# Patient Record
Sex: Female | Born: 1965
Health system: Southern US, Community
[De-identification: ages and names within clinical notes are randomized; demographics above are authoritative.]

## PROBLEM LIST (undated history)

## (undated) DIAGNOSIS — R112 Nausea with vomiting, unspecified: Secondary | ICD-10-CM

## (undated) DIAGNOSIS — T7840XA Allergy, unspecified, initial encounter: Secondary | ICD-10-CM

## (undated) DIAGNOSIS — I10 Essential (primary) hypertension: Secondary | ICD-10-CM

## (undated) DIAGNOSIS — Z923 Personal history of irradiation: Secondary | ICD-10-CM

## (undated) DIAGNOSIS — Z86010 Personal history of colonic polyps: Secondary | ICD-10-CM

## (undated) DIAGNOSIS — E119 Type 2 diabetes mellitus without complications: Secondary | ICD-10-CM

## (undated) DIAGNOSIS — J383 Other diseases of vocal cords: Secondary | ICD-10-CM

## (undated) DIAGNOSIS — C50919 Malignant neoplasm of unspecified site of unspecified female breast: Secondary | ICD-10-CM

## (undated) DIAGNOSIS — Z9889 Other specified postprocedural states: Secondary | ICD-10-CM

## (undated) HISTORY — DX: Allergy, unspecified, initial encounter: T78.40XA

## (undated) HISTORY — DX: Essential (primary) hypertension: I10

## (undated) HISTORY — DX: Malignant neoplasm of unspecified site of unspecified female breast: C50.919

## (undated) HISTORY — DX: Type 2 diabetes mellitus without complications: E11.9

## (undated) HISTORY — DX: Other diseases of vocal cords: J38.3

## (undated) HISTORY — DX: Personal history of colonic polyps: Z86.010

## (undated) HISTORY — DX: Personal history of irradiation: Z92.3

---

## 1983-04-02 HISTORY — PX: BREAST SURGERY: SHX581

## 1997-08-29 ENCOUNTER — Encounter: Admission: RE | Admit: 1997-08-29 | Discharge: 1997-08-29 | Payer: Self-pay | Admitting: Sports Medicine

## 1998-08-04 ENCOUNTER — Other Ambulatory Visit: Admission: RE | Admit: 1998-08-04 | Discharge: 1998-08-04 | Payer: Self-pay | Admitting: *Deleted

## 1998-09-15 ENCOUNTER — Other Ambulatory Visit: Admission: RE | Admit: 1998-09-15 | Discharge: 1998-09-15 | Payer: Self-pay | Admitting: *Deleted

## 1998-09-29 ENCOUNTER — Other Ambulatory Visit: Admission: RE | Admit: 1998-09-29 | Discharge: 1998-09-29 | Payer: Self-pay | Admitting: *Deleted

## 1998-09-29 ENCOUNTER — Encounter (INDEPENDENT_AMBULATORY_CARE_PROVIDER_SITE_OTHER): Payer: Self-pay | Admitting: Specialist

## 1999-09-28 ENCOUNTER — Other Ambulatory Visit: Admission: RE | Admit: 1999-09-28 | Discharge: 1999-09-28 | Payer: Self-pay | Admitting: *Deleted

## 1999-10-02 ENCOUNTER — Encounter: Admission: RE | Admit: 1999-10-02 | Discharge: 1999-10-02 | Payer: Self-pay | Admitting: *Deleted

## 1999-10-02 ENCOUNTER — Encounter: Payer: Self-pay | Admitting: *Deleted

## 2000-10-28 ENCOUNTER — Other Ambulatory Visit: Admission: RE | Admit: 2000-10-28 | Discharge: 2000-10-28 | Payer: Self-pay | Admitting: *Deleted

## 2001-04-01 ENCOUNTER — Encounter: Payer: Self-pay | Admitting: Family Medicine

## 2001-04-01 ENCOUNTER — Ambulatory Visit (HOSPITAL_COMMUNITY): Admission: RE | Admit: 2001-04-01 | Discharge: 2001-04-01 | Payer: Self-pay | Admitting: Family Medicine

## 2002-03-02 ENCOUNTER — Encounter: Admission: RE | Admit: 2002-03-02 | Discharge: 2002-03-02 | Payer: Self-pay | Admitting: Family Medicine

## 2002-03-02 ENCOUNTER — Encounter: Payer: Self-pay | Admitting: Family Medicine

## 2002-09-07 ENCOUNTER — Encounter: Admission: RE | Admit: 2002-09-07 | Discharge: 2002-09-07 | Payer: Self-pay | Admitting: Occupational Medicine

## 2002-09-07 ENCOUNTER — Encounter: Payer: Self-pay | Admitting: Occupational Medicine

## 2003-03-29 ENCOUNTER — Encounter: Admission: RE | Admit: 2003-03-29 | Discharge: 2003-03-29 | Payer: Self-pay | Admitting: Family Medicine

## 2004-04-24 ENCOUNTER — Encounter: Admission: RE | Admit: 2004-04-24 | Discharge: 2004-04-24 | Payer: Self-pay | Admitting: Obstetrics and Gynecology

## 2004-05-30 ENCOUNTER — Emergency Department (HOSPITAL_COMMUNITY): Admission: EM | Admit: 2004-05-30 | Discharge: 2004-05-30 | Payer: Self-pay | Admitting: Emergency Medicine

## 2004-08-30 ENCOUNTER — Emergency Department (HOSPITAL_COMMUNITY): Admission: EM | Admit: 2004-08-30 | Discharge: 2004-08-30 | Payer: Self-pay | Admitting: Family Medicine

## 2004-09-03 ENCOUNTER — Ambulatory Visit (HOSPITAL_COMMUNITY): Admission: RE | Admit: 2004-09-03 | Discharge: 2004-09-03 | Payer: Self-pay | Admitting: Allergy

## 2004-10-16 ENCOUNTER — Emergency Department (HOSPITAL_COMMUNITY): Admission: EM | Admit: 2004-10-16 | Discharge: 2004-10-16 | Payer: Self-pay | Admitting: Family Medicine

## 2005-03-11 ENCOUNTER — Emergency Department (HOSPITAL_COMMUNITY): Admission: EM | Admit: 2005-03-11 | Discharge: 2005-03-11 | Payer: Self-pay | Admitting: Emergency Medicine

## 2005-05-03 ENCOUNTER — Encounter: Admission: RE | Admit: 2005-05-03 | Discharge: 2005-05-03 | Payer: Self-pay | Admitting: Family Medicine

## 2005-05-08 ENCOUNTER — Emergency Department (HOSPITAL_COMMUNITY): Admission: EM | Admit: 2005-05-08 | Discharge: 2005-05-08 | Payer: Self-pay | Admitting: Emergency Medicine

## 2005-05-28 ENCOUNTER — Other Ambulatory Visit: Admission: RE | Admit: 2005-05-28 | Discharge: 2005-05-28 | Payer: Self-pay | Admitting: Obstetrics and Gynecology

## 2005-07-24 ENCOUNTER — Ambulatory Visit: Payer: Self-pay | Admitting: Internal Medicine

## 2005-08-06 ENCOUNTER — Ambulatory Visit: Payer: Self-pay | Admitting: Internal Medicine

## 2005-08-13 ENCOUNTER — Ambulatory Visit: Payer: Self-pay

## 2005-08-13 ENCOUNTER — Encounter: Payer: Self-pay | Admitting: Cardiology

## 2005-10-14 ENCOUNTER — Ambulatory Visit: Payer: Self-pay | Admitting: Internal Medicine

## 2005-12-27 ENCOUNTER — Ambulatory Visit: Payer: Self-pay | Admitting: Internal Medicine

## 2006-01-07 ENCOUNTER — Ambulatory Visit: Payer: Self-pay | Admitting: Critical Care Medicine

## 2006-01-07 ENCOUNTER — Ambulatory Visit: Payer: Self-pay | Admitting: Internal Medicine

## 2006-01-08 ENCOUNTER — Encounter: Payer: Self-pay | Admitting: Critical Care Medicine

## 2006-01-14 ENCOUNTER — Ambulatory Visit (HOSPITAL_COMMUNITY): Admission: RE | Admit: 2006-01-14 | Discharge: 2006-01-14 | Payer: Self-pay | Admitting: Internal Medicine

## 2006-01-23 ENCOUNTER — Ambulatory Visit: Payer: Self-pay | Admitting: Critical Care Medicine

## 2006-02-06 ENCOUNTER — Ambulatory Visit: Payer: Self-pay | Admitting: Critical Care Medicine

## 2006-02-11 ENCOUNTER — Encounter: Payer: Self-pay | Admitting: Critical Care Medicine

## 2006-02-11 ENCOUNTER — Ambulatory Visit (HOSPITAL_COMMUNITY): Admission: RE | Admit: 2006-02-11 | Discharge: 2006-02-11 | Payer: Self-pay | Admitting: Critical Care Medicine

## 2006-02-26 ENCOUNTER — Ambulatory Visit: Payer: Self-pay | Admitting: Critical Care Medicine

## 2006-03-18 ENCOUNTER — Ambulatory Visit: Payer: Self-pay | Admitting: Critical Care Medicine

## 2006-04-18 ENCOUNTER — Ambulatory Visit: Payer: Self-pay | Admitting: Internal Medicine

## 2006-06-30 ENCOUNTER — Ambulatory Visit: Payer: Self-pay | Admitting: Critical Care Medicine

## 2006-09-03 ENCOUNTER — Ambulatory Visit: Payer: Self-pay | Admitting: Critical Care Medicine

## 2006-11-19 ENCOUNTER — Emergency Department (HOSPITAL_COMMUNITY): Admission: EM | Admit: 2006-11-19 | Discharge: 2006-11-19 | Payer: Self-pay | Admitting: Family Medicine

## 2006-12-12 ENCOUNTER — Emergency Department (HOSPITAL_COMMUNITY): Admission: EM | Admit: 2006-12-12 | Discharge: 2006-12-12 | Payer: Self-pay | Admitting: Emergency Medicine

## 2006-12-15 ENCOUNTER — Ambulatory Visit: Payer: Self-pay | Admitting: *Deleted

## 2007-01-19 DIAGNOSIS — J309 Allergic rhinitis, unspecified: Secondary | ICD-10-CM | POA: Insufficient documentation

## 2007-01-19 DIAGNOSIS — I10 Essential (primary) hypertension: Secondary | ICD-10-CM | POA: Insufficient documentation

## 2007-03-03 ENCOUNTER — Ambulatory Visit: Payer: Self-pay | Admitting: Critical Care Medicine

## 2007-05-04 ENCOUNTER — Telehealth: Payer: Self-pay | Admitting: Critical Care Medicine

## 2007-12-23 ENCOUNTER — Telehealth (INDEPENDENT_AMBULATORY_CARE_PROVIDER_SITE_OTHER): Payer: Self-pay | Admitting: *Deleted

## 2008-01-12 ENCOUNTER — Ambulatory Visit: Payer: Self-pay | Admitting: Internal Medicine

## 2008-02-15 ENCOUNTER — Ambulatory Visit: Payer: Self-pay | Admitting: Internal Medicine

## 2008-03-11 ENCOUNTER — Ambulatory Visit: Payer: Self-pay | Admitting: Critical Care Medicine

## 2008-03-11 DIAGNOSIS — R0989 Other specified symptoms and signs involving the circulatory and respiratory systems: Secondary | ICD-10-CM | POA: Insufficient documentation

## 2008-03-11 DIAGNOSIS — R0609 Other forms of dyspnea: Secondary | ICD-10-CM

## 2008-04-18 ENCOUNTER — Ambulatory Visit: Payer: Self-pay | Admitting: Internal Medicine

## 2008-04-22 ENCOUNTER — Encounter: Payer: Self-pay | Admitting: Critical Care Medicine

## 2008-04-25 ENCOUNTER — Encounter: Payer: Self-pay | Admitting: Critical Care Medicine

## 2008-05-02 ENCOUNTER — Telehealth (INDEPENDENT_AMBULATORY_CARE_PROVIDER_SITE_OTHER): Payer: Self-pay | Admitting: *Deleted

## 2008-07-18 ENCOUNTER — Ambulatory Visit: Payer: Self-pay | Admitting: Critical Care Medicine

## 2008-08-04 ENCOUNTER — Telehealth: Payer: Self-pay | Admitting: Critical Care Medicine

## 2008-08-16 ENCOUNTER — Ambulatory Visit: Payer: Self-pay | Admitting: Internal Medicine

## 2008-09-15 ENCOUNTER — Ambulatory Visit: Payer: Self-pay | Admitting: Internal Medicine

## 2008-10-19 ENCOUNTER — Telehealth (INDEPENDENT_AMBULATORY_CARE_PROVIDER_SITE_OTHER): Payer: Self-pay | Admitting: *Deleted

## 2008-12-16 ENCOUNTER — Ambulatory Visit: Payer: Self-pay | Admitting: Internal Medicine

## 2009-01-12 ENCOUNTER — Ambulatory Visit: Payer: Self-pay | Admitting: Internal Medicine

## 2009-01-25 ENCOUNTER — Telehealth: Payer: Self-pay | Admitting: Internal Medicine

## 2009-02-06 ENCOUNTER — Ambulatory Visit: Payer: Self-pay | Admitting: Internal Medicine

## 2009-02-08 ENCOUNTER — Telehealth (INDEPENDENT_AMBULATORY_CARE_PROVIDER_SITE_OTHER): Payer: Self-pay | Admitting: *Deleted

## 2009-04-27 ENCOUNTER — Ambulatory Visit: Payer: Self-pay | Admitting: Internal Medicine

## 2009-04-27 DIAGNOSIS — M549 Dorsalgia, unspecified: Secondary | ICD-10-CM | POA: Insufficient documentation

## 2009-05-05 ENCOUNTER — Telehealth: Payer: Self-pay | Admitting: Internal Medicine

## 2009-05-29 ENCOUNTER — Ambulatory Visit: Payer: Self-pay | Admitting: Internal Medicine

## 2009-07-24 ENCOUNTER — Telehealth: Payer: Self-pay | Admitting: Critical Care Medicine

## 2009-07-24 ENCOUNTER — Ambulatory Visit: Payer: Self-pay | Admitting: Critical Care Medicine

## 2009-07-24 DIAGNOSIS — I498 Other specified cardiac arrhythmias: Secondary | ICD-10-CM | POA: Insufficient documentation

## 2009-07-24 DIAGNOSIS — J383 Other diseases of vocal cords: Secondary | ICD-10-CM | POA: Insufficient documentation

## 2009-07-24 LAB — CONVERTED CEMR LAB
T3 Uptake Ratio: 27.9 % (ref 22.5–37.0)
TSH: 0.77 microintl units/mL (ref 0.35–5.50)

## 2009-07-26 ENCOUNTER — Telehealth: Payer: Self-pay | Admitting: Internal Medicine

## 2009-07-26 DIAGNOSIS — E049 Nontoxic goiter, unspecified: Secondary | ICD-10-CM | POA: Insufficient documentation

## 2009-07-28 ENCOUNTER — Encounter: Admission: RE | Admit: 2009-07-28 | Discharge: 2009-09-07 | Payer: Self-pay | Admitting: Critical Care Medicine

## 2009-08-01 ENCOUNTER — Encounter: Payer: Self-pay | Admitting: Internal Medicine

## 2009-08-01 ENCOUNTER — Encounter: Admission: RE | Admit: 2009-08-01 | Discharge: 2009-08-01 | Payer: Self-pay | Admitting: Neurological Surgery

## 2009-08-09 ENCOUNTER — Telehealth (INDEPENDENT_AMBULATORY_CARE_PROVIDER_SITE_OTHER): Payer: Self-pay | Admitting: *Deleted

## 2009-08-29 ENCOUNTER — Telehealth: Payer: Self-pay | Admitting: Critical Care Medicine

## 2009-09-05 ENCOUNTER — Encounter: Payer: Self-pay | Admitting: Critical Care Medicine

## 2009-09-05 ENCOUNTER — Telehealth: Payer: Self-pay | Admitting: Critical Care Medicine

## 2009-09-18 ENCOUNTER — Ambulatory Visit: Payer: Self-pay | Admitting: Internal Medicine

## 2009-09-18 ENCOUNTER — Ambulatory Visit: Payer: Self-pay | Admitting: Psychology

## 2009-10-10 ENCOUNTER — Ambulatory Visit: Payer: Self-pay | Admitting: Psychology

## 2009-10-31 ENCOUNTER — Ambulatory Visit: Payer: Self-pay | Admitting: Psychology

## 2009-11-17 ENCOUNTER — Ambulatory Visit: Payer: Self-pay | Admitting: Internal Medicine

## 2009-11-17 ENCOUNTER — Ambulatory Visit: Payer: Self-pay | Admitting: Psychology

## 2009-12-15 ENCOUNTER — Ambulatory Visit: Payer: Self-pay | Admitting: Psychology

## 2009-12-28 ENCOUNTER — Telehealth: Payer: Self-pay | Admitting: Internal Medicine

## 2010-01-08 ENCOUNTER — Ambulatory Visit: Payer: Self-pay | Admitting: Psychology

## 2010-01-26 ENCOUNTER — Ambulatory Visit: Payer: Self-pay | Admitting: Psychology

## 2010-02-10 ENCOUNTER — Telehealth (INDEPENDENT_AMBULATORY_CARE_PROVIDER_SITE_OTHER): Payer: Self-pay | Admitting: *Deleted

## 2010-02-13 ENCOUNTER — Ambulatory Visit: Payer: Self-pay | Admitting: Psychology

## 2010-03-08 ENCOUNTER — Ambulatory Visit: Payer: Self-pay | Admitting: Psychology

## 2010-03-15 ENCOUNTER — Ambulatory Visit (HOSPITAL_COMMUNITY)
Admission: RE | Admit: 2010-03-15 | Discharge: 2010-03-15 | Payer: Self-pay | Source: Home / Self Care | Attending: Obstetrics and Gynecology | Admitting: Obstetrics and Gynecology

## 2010-03-29 ENCOUNTER — Ambulatory Visit: Payer: Self-pay | Admitting: Psychology

## 2010-04-10 ENCOUNTER — Ambulatory Visit: Admit: 2010-04-10 | Payer: Self-pay | Admitting: Psychology

## 2010-04-25 ENCOUNTER — Ambulatory Visit
Admission: RE | Admit: 2010-04-25 | Discharge: 2010-04-25 | Payer: Self-pay | Source: Home / Self Care | Attending: Internal Medicine | Admitting: Internal Medicine

## 2010-04-25 DIAGNOSIS — J0191 Acute recurrent sinusitis, unspecified: Secondary | ICD-10-CM | POA: Insufficient documentation

## 2010-04-25 DIAGNOSIS — Z91199 Patient's noncompliance with other medical treatment and regimen due to unspecified reason: Secondary | ICD-10-CM | POA: Insufficient documentation

## 2010-04-25 DIAGNOSIS — Z9119 Patient's noncompliance with other medical treatment and regimen: Secondary | ICD-10-CM | POA: Insufficient documentation

## 2010-04-26 ENCOUNTER — Telehealth: Payer: Self-pay | Admitting: Internal Medicine

## 2010-05-01 NOTE — Miscellaneous (Signed)
Summary: Discharge/[place]  Discharge/   Imported By: Lester [place] 10/13/2009 08:37:41  _____________________________________________________________________  External Attachment:    Type:   Image     Comment:   External Document

## 2010-05-01 NOTE — Progress Notes (Signed)
     Follow-up for Phone Call       Follow-up by: Etta Grandchild MD,  July 26, 2009 8:31 AM  New Problems: THYROMEGALY (ICD-240.9)   New Problems: THYROMEGALY (ICD-240.9)

## 2010-05-01 NOTE — Assessment & Plan Note (Signed)
Summary: Pulmonary OV   Primary Provider/Referring Provider:  Etta Grandchild MD  CC:  Acute Visit.  Last seen 06/2008.  Pt c/o voice going and coming x 1 wk.  states she will have a little SOB with the episodes.  .  History of Present Illness:   Patient is a 45year-old African-American female patient of Dr. Lynelle Doctor who has a known history of vocal cord dysfunction syndrome and allergic rhinitis.  No true asthma.  Constantly seeking disability from [facility]    July 24, 2009 9:24 AM Not seen since 4/10 Hx VCD and allergic rhinitis and no true asthma.  Constantly seeking disability. Was ok until last week.   No real nasal drip.  No real cough.    When spells hit gets very dyspneic and cannot catch breath.   Also hoarse.    No using voice alot.  Pt is unemployed.  Perfume is an issue.     Preventive Screening-Counseling & Management  Alcohol-Tobacco     Smoking Status: never  Current Medications (verified): 1)  Diovan Hct 160-25 Mg Tabs (Valsartan-Hydrochlorothiazide) .... Once Daily 2)  Birth Control .... Once Daily  Allergies (verified): 1)  ! Asa 2)  ! Codeine 3)  ! * Ryzolt 4)  Codeine Phosphate (Codeine Phosphate)  Past History:  Past medical, surgical, family and social histories (including risk factors) reviewed, and no changes noted (except as noted below).  Past Medical History: Reviewed history from 03/11/2008 and no changes required. Allergic rhinitis     -Neg RAST 2007 Hypertension Vocal Cord Dysfunction     -Neg Methacholine challenge test  2007  Past Surgical History: Reviewed history from 01/12/2008 and no changes required. Denies surgical history  Past Pulmonary History:  Pulmonary History: Vocal Cord Dysfunction     -Neg Methacholine challenge test  2007  Allergic rhinitis     -Neg RAST 2007  Family History: Reviewed history from 01/12/2008 and no changes required. Family History Hypertension  Social History: Reviewed history from  05/29/2009 and no changes required. Occupation: Unemployed currently Single Never Smoked Alcohol use-yes Drug use-no Regular exercise-no  Review of Systems       The patient complains of shortness of breath with activity, shortness of breath at rest, and sore throat.  The patient denies productive cough, non-productive cough, coughing up blood, chest pain, irregular heartbeats, acid heartburn, indigestion, loss of appetite, weight change, abdominal pain, difficulty swallowing, tooth/dental problems, headaches, nasal congestion/difficulty breathing through nose, sneezing, itching, ear ache, anxiety, depression, hand/feet swelling, joint stiffness or pain, rash, change in color of mucus, and fever.    Vital Signs:  Patient profile:   45 year old female Menstrual status:  regular Height:      65 inches Weight:      162.38 pounds BMI:     27.12 O2 Sat:      100 % on Room air Temp:     98.0 degrees F oral Pulse rate:   119 / minute BP sitting:   142 / 92  (left arm) Cuff size:   regular  Vitals Entered By: Gweneth Dimitri RN (July 24, 2009 9:19 AM)  O2 Flow:  Room air CC: Acute Visit.  Last seen 06/2008.  Pt c/o voice going and coming x 1 wk.  states she will have a little SOB with the episodes.   Comments Medications reviewed with patient Daytime contact number verified with patient. Gweneth Dimitri RN  July 24, 2009 9:18 AM    Physical Exam  Additional  Exam:  Gen: Pleasant, anxious, well nourished, in no distress ENT: no lesions, mild postnasal drip, prominent thyromegaly Neck: No JVD, no TMG, no carotid bruits Lungs: no use of accessory muscles, no dullness to percussion, prominent pseudowheeze Cardiovascular: RRR, heart sounds normal, no murmurs or gallops, no peripheral edema, tachycardia Musculoskeletal: No deformities, no cyanosis or clubbing     Impression & Recommendations:  Problem # 1:  VOCAL CORD DISORDER (ICD-478.5) Assessment Unchanged Severe vocal cord  dysfunction, with prominent pseudo-wheeze.  No evidence of active reflux or post nasal drip syndrome. Significant thyromegaly on exam. Plan Obtain speech therapy consultation for vocal cord therapy. Obtain thyroid function studies Refer back to primary care for thyroid evaluation Orders: Est. Patient Level IV (16109) Speech Therapy (Speech Therapy)  Problem # 2:  SINUS TACHYCARDIA (ICD-427.89) Assessment: Unchanged sinus tachycardia, but normal thyroid function on studies obtained today, July 24 2009 Plan REFER BACK TO PCP for eval of thyromegaly Orders: Est. Patient Level IV (60454) TLB-TSH (Thyroid Stimulating Hormone) (84443-TSH) TLB-T3, Free (Triiodothyronine) (84481-T3FREE) TLB-T4 (Thyrox), Free [phone]) TLB-T3 Uptake (84479-T3UP) TLB-T4 (Thyrox), Total (82956-O1)  Medications Added to Medication List This Visit: 1)  Birth Control  .... Once daily  Complete Medication List: 1)  Diovan Hct 160-25 Mg Tabs (Valsartan-hydrochlorothiazide) .... Once daily 2)  Birth Control  .... Once daily  Patient Instructions: 1)  Thyroid test today 2)  Speech pathology evaluation for vocal cord dysfunction 3)  I will ask Dr [name] to see you for your thyroid 4)  Return as needed

## 2010-05-01 NOTE — Assessment & Plan Note (Signed)
Summary: ONE MONTH FOLLOW UP-LB   Vital Signs:  Patient profile:   45 year old female Menstrual status:  regular Height:      66 inches Weight:      155.50 pounds BMI:     25.19 O2 Sat:      98 % on Room air Temp:     97.7 degrees F oral Pulse rate:   97 / minute Pulse rhythm:   regular Resp:     16 per minute BP sitting:   132 / 80  (right arm)  Vitals Entered By: Lucious Groves (May 29, 2009 9:57 AM)  Nutrition Counseling: Patient's BMI is greater than 25 and therefore counseled on weight management options.  O2 Flow:  Room air CC: F/U--pt states doing fine./kb, Hypertension Management Is Patient Diabetic? No Pain Assessment Patient in pain? no        Primary Care Provider:  Etta Grandchild MD  CC:  F/U--pt states doing fine./kb and Hypertension Management.  History of Present Illness: She returns for f/up and she reports that her LBP has resolved. She feels well. She has stopped working at [employer] b/c they scheduled her to be off for 2 days when she wanted to work.  Hypertension History:      She denies headache, chest pain, palpitations, dyspnea with exertion, orthopnea, PND, peripheral edema, visual symptoms, neurologic problems, syncope, and side effects from treatment.        Positive major cardiovascular risk factors include hypertension.  Negative major cardiovascular risk factors include female age less than 65 years old, no history of diabetes or hyperlipidemia, negative family history for ischemic heart disease, and non-tobacco-user status.        Positive history for target organ damage include hypertensive retinopathy.  Further assessment for target organ damage reveals no history of ASHD, cardiac end-organ damage (CHF/LVH), stroke/TIA, peripheral vascular disease, or renal insufficiency.     Current Medications (verified): 1)  Diovan Hct 160-25 Mg Tabs (Valsartan-Hydrochlorothiazide) .... Once Daily 2)  Yaz 3-0.02 Mg Tabs (Drospirenone-Ethinyl  Estradiol)  Allergies (verified): 1)  ! Asa 2)  ! Codeine 3)  ! * Ryzolt 4)  Codeine Phosphate (Codeine Phosphate)  Past History:  Past Medical History: Reviewed history from 03/11/2008 and no changes required. Allergic rhinitis     -Neg RAST 2007 Hypertension Vocal Cord Dysfunction     -Neg Methacholine challenge test  2007  Past Surgical History: Reviewed history from 01/12/2008 and no changes required. Denies surgical history  Family History: Reviewed history from 01/12/2008 and no changes required. Family History Hypertension  Social History: Reviewed history from 01/12/2008 and no changes required. Occupation: Unemployed currently Single Never Smoked Alcohol use-yes Drug use-no Regular exercise-no  Review of Systems       The patient complains of weight gain.  The patient denies anorexia, fever, chest pain, abdominal pain, hematuria, suspicious skin lesions, and depression.    Physical Exam  General:  Well-developed, well-nourished, in no acute distress; alert and oriented x 3.   Head:  normocephalic and atraumatic.   Mouth:  good dentition, pharynx pink and moist, and no erythema.   Neck:  supple, full ROM, no masses, no thyromegaly, no cervical lymphadenopathy, and no neck tenderness.   Lungs:  normal respiratory effort, no intercostal retractions, no accessory muscle use, normal breath sounds, and no dullness.   Heart:  normal rate, regular rhythm, no murmur, no gallop, and no rub.   Abdomen:  soft, non-tender, normal bowel sounds, no distention, no  masses, no guarding, no rigidity, no hepatomegaly, and no splenomegaly.   Msk:  No deformity or scoliosis noted of thoracic or lumbar spine.     Detailed Back/Spine Exam  General:    Well-developed, well-nourished, in no acute distress; alert and oriented x 3.    Lumbosacral Exam:  Inspection-deformity:    Normal Palpation-spinal tenderness:  Normal Range of Motion:    Forward Flexion:   60 degrees     Hyperextension:   25 degrees    Schober's:        >6 cm    Right Lateral Bend:   25 degrees    Left Lateral Bend:   25 degrees Squatting:  normal Lying Straight Leg Raise:    Right:  negative    Left:  negative Sitting Straight Leg Raise:    Right:  negative    Left:  negative Reverse Straight Leg Raise:    Right:  negative    Left:  negative Contralateral Straight Leg Raise:    Right:  negative    Left:  negative Sciatic Notch:    There is no sciatic notch tenderness. Toe Walking:    Right:  normal    Left:  normal Heel Walking:    Right:  normal    Left:  normal   Impression & Recommendations:  Problem # 1:  BACK PAIN (ICD-724.5) Assessment Improved  Discussed use of moist heat or ice, modified activities, medications, and stretching/strengthening exercises. Back care instructions given. To be seen in 2 weeks if no improvement; sooner if worsening of symptoms.   Problem # 2:  HYPERTENSION (ICD-401.9) Assessment: Improved  Her updated medication list for this problem includes:    Diovan Hct 160-25 Mg Tabs (Valsartan-hydrochlorothiazide) ..... Once daily  BP today: 132/80 Prior BP: 122/82 (04/27/2009)  Prior 10 Yr Risk Heart Disease: Not enough information (01/12/2008)  Complete Medication List: 1)  Diovan Hct 160-25 Mg Tabs (Valsartan-hydrochlorothiazide) .... Once daily 2)  Yaz 3-0.02 Mg Tabs (Drospirenone-ethinyl estradiol)  Hypertension Assessment/Plan:      The patient's hypertensive risk group is category C: Target organ damage and/or diabetes.  Today's blood pressure is 132/80.  Her blood pressure goal is < 140/90.  Patient Instructions: 1)  Please schedule a follow-up appointment in 4 months. 2)  Check your Blood Pressure regularly. If it is above 130/80: you should make an appointment.

## 2010-05-01 NOTE — Assessment & Plan Note (Signed)
Summary: sinus trouble-lb   Vital Signs:  Patient profile:   45 year old female Menstrual status:  regular Height:      65 inches Weight:      155.25 pounds BMI:     25.93 O2 Sat:      98 % on Room air Temp:     97.8 degrees F oral Pulse rate:   92 / minute Pulse rhythm:   regular Resp:     16 per minute BP sitting:   128 / 72  (left arm) Cuff size:   large  Vitals Entered By: Rock Nephew CMA (September 18, 2009 10:31 AM)  Nutrition Counseling: Patient's BMI is greater than 25 and therefore counseled on weight management options.  O2 Flow:  Room air CC: sinus, allergy congestion x 09/14/09 w cough and facial pressure, URI symptoms, Hypertension Management Is Patient Diabetic? No Pain Assessment Patient in pain? no        Primary Care Provider:  Etta Grandchild MD  CC:  sinus, allergy congestion x 09/14/09 w cough and facial pressure, URI symptoms, and Hypertension Management.  History of Present Illness:  URI Symptoms      This is a 45 year old woman who presents with URI symptoms.  The symptoms began 4 days ago.  The severity is described as mild.  The patient reports nasal congestion, clear nasal discharge, and dry cough, but denies purulent nasal discharge, sore throat, productive cough, earache, and sick contacts.  The patient denies fever, stiff neck, dyspnea, wheezing, rash, vomiting, diarrhea, use of an antipyretic, and response to antipyretic.  The patient also reports sneezing and seasonal symptoms.  The patient denies itchy watery eyes, itchy throat, headache, muscle aches, and severe fatigue.  The patient denies the following risk factors for Strep sinusitis: unilateral facial pain, unilateral nasal discharge, double sickening, Strep exposure, tender adenopathy, and absence of cough.    Hypertension History:      She denies headache, chest pain, palpitations, dyspnea with exertion, orthopnea, PND, peripheral edema, visual symptoms, neurologic problems, syncope, and  side effects from treatment.  She notes no problems with any antihypertensive medication side effects.        Positive major cardiovascular risk factors include hypertension.  Negative major cardiovascular risk factors include female age less than 58 years old, no history of diabetes or hyperlipidemia, negative family history for ischemic heart disease, and non-tobacco-user status.        Positive history for target organ damage include hypertensive retinopathy.  Further assessment for target organ damage reveals no history of ASHD, cardiac end-organ damage (CHF/LVH), stroke/TIA, peripheral vascular disease, or renal insufficiency.     Preventive Screening-Counseling & Management  Alcohol-Tobacco     Alcohol drinks/day: <1     Smoking Status: never     Passive Smoke Exposure: no  Hep-HIV-STD-Contraception     Hepatitis Risk: no risk noted     HIV Risk: no     STD Risk: no risk noted  Current Medications (verified): 1)  Diovan Hct 160-25 Mg Tabs (Valsartan-Hydrochlorothiazide) .... Once Daily 2)  Birth Control .... Once Daily  Allergies (verified): 1)  ! Asa 2)  ! Codeine 3)  ! * Ryzolt 4)  Codeine Phosphate (Codeine Phosphate)  Past History:  Past Medical History: Last updated: 03/11/2008 Allergic rhinitis     -Neg RAST 2007 Hypertension Vocal Cord Dysfunction     -Neg Methacholine challenge test  2007  Past Surgical History: Last updated: 01/12/2008 Denies surgical history  Family History: Last updated: 01/12/2008 Family History Hypertension  Social History: Last updated: 05/29/2009 Occupation: Unemployed currently Single Never Smoked Alcohol use-yes Drug use-no Regular exercise-no  Risk Factors: Alcohol Use: <1 (09/18/2009) Exercise: no (01/12/2008)  Risk Factors: Smoking Status: never (09/18/2009) Passive Smoke Exposure: no (09/18/2009)  Family History: Reviewed history from 01/12/2008 and no changes required. Family History Hypertension  Social  History: Reviewed history from 05/29/2009 and no changes required. Occupation: Unemployed currently Single Never Smoked Alcohol use-yes Drug use-no Regular exercise-no  Review of Systems  The patient denies anorexia, fever, weight loss, decreased hearing, hoarseness, chest pain, peripheral edema, prolonged cough, headaches, hemoptysis, abdominal pain, hematuria, suspicious skin lesions, enlarged lymph nodes, and angioedema.    Physical Exam  General:  Well-developed, well-nourished, in no acute distress; alert and oriented x 3.   Ears:  R ear normal and L ear normal.   Nose:  no nasal discharge, no airflow obstruction, no intranasal foreign body, no nasal polyps, no nasal mucosal lesions, no mucosal friability, no active bleeding or clots, no sinus percussion tenderness, no septum abnormalities, mucosal pallor, and mucosal edema.   Mouth:  good dentition, pharynx pink and moist, and no erythema.   Neck:  supple, full ROM, no masses, no thyromegaly, no cervical lymphadenopathy, and no neck tenderness.   Lungs:  normal respiratory effort, no intercostal retractions, no accessory muscle use, normal breath sounds, and no dullness.   Heart:  normal rate, regular rhythm, no murmur, no gallop, and no rub.   Abdomen:  soft, non-tender, normal bowel sounds, no distention, no masses, no guarding, no rigidity, no hepatomegaly, and no splenomegaly.   Msk:  No deformity or scoliosis noted of thoracic or lumbar spine.   Pulses:  R and L carotid,radial,femoral,dorsalis pedis and posterior tibial pulses are full and equal bilaterally Extremities:  No clubbing, cyanosis, edema, or deformity noted with normal full range of motion of all joints.   Neurologic:  No cranial nerve deficits noted. Station and gait are normal. Plantar reflexes are down-going bilaterally. DTRs are symmetrical throughout. Sensory, motor and coordinative functions appear intact. Skin:  turgor normal, color normal, no rashes, no  ecchymoses, no purpura, and no edema.   Cervical Nodes:  No lymphadenopathy noted Axillary Nodes:  No palpable lymphadenopathy Psych:  Cognition and judgment appear intact. Alert and cooperative with normal attention span and concentration. No apparent delusions, illusions, hallucinations   Impression & Recommendations:  Problem # 1:  ALLERGIC RHINITIS (ICD-477.9) Assessment Deteriorated  Her updated medication list for this problem includes:    Allegra 180 Mg Tabs (Fexofenadine hcl) ..... One by mouth once daily  Discussed use of allergy medications and environmental measures.   Orders: Admin of Therapeutic Inj  intramuscular or subcutaneous (98119) Depo- Medrol 40mg  (J1030) Depo- Medrol 80mg  (J1040)  Problem # 2:  HYPERTENSION (ICD-401.9) Assessment: Improved  Her updated medication list for this problem includes:    Diovan Hct 160-25 Mg Tabs (Valsartan-hydrochlorothiazide) ..... Once daily  BP today: 128/72 Prior BP: 142/92 (07/24/2009)  Prior 10 Yr Risk Heart Disease: Not enough information (01/12/2008)  Complete Medication List: 1)  Diovan Hct 160-25 Mg Tabs (Valsartan-hydrochlorothiazide) .... Once daily 2)  Birth Control  .... Once daily 3)  Allegra 180 Mg Tabs (Fexofenadine hcl) .... One by mouth once daily  Hypertension Assessment/Plan:      The patient's hypertensive risk group is category C: Target organ damage and/or diabetes.  Today's blood pressure is 128/72.  Her blood pressure goal is < 140/90.  Patient Instructions: 1)  Please schedule a follow-up appointment in 2 months. 2)  Check your Blood Pressure regularly. If it is above 140/90: you should make an appointment. Prescriptions: ALLEGRA 180 MG TABS (FEXOFENADINE HCL) One by mouth once daily  #30 x 11   Entered and Authorized by:   Etta Grandchild MD   Signed by:   Etta Grandchild MD on 09/18/2009   Method used:   Print then Give to Patient   RxID:   [id number]    Medication  Administration  Injection # 1:    Medication: Depo- Medrol 80mg     Diagnosis: ALLERGIC RHINITIS (ICD-477.9)    Route: IM    Site: R deltoid    Exp Date: 06/2012    Lot #: [id number]    Mfr: [employer]    Patient tolerated injection without complications    Given by: Rock Nephew CMA (September 18, 2009 11:01 AM)  Injection # 2:    Medication: Depo- Medrol 40mg     Diagnosis: ALLERGIC RHINITIS (ICD-477.9)    Route: IM    Site: R deltoid    Exp Date: 06/2012    Lot #: [id number]    Mfr: [employer]ent    Patient tolerated injection without complications    Given by: Rock Nephew CMA (September 18, 2009 11:01 AM)  Orders Added: 1)  Admin of Therapeutic Inj  intramuscular or subcutaneous [96372] 2)  Depo- Medrol 40mg  [J1030] 3)  Depo- Medrol 80mg  [J1040] 4)  Est. Patient Level IV [14782]

## 2010-05-01 NOTE — Progress Notes (Signed)
Summary: FYI  Phone Note From Other Clinic Call back at [phone]   Caller: carl schinke//[facility] Call For: Brooke Wood Summary of Call: States he is going to suggest that she be referred  to a psychologist for her vocal cord dysfunction. Initial call taken by: Darletta Moll,  September 05, 2009 9:49 AM  Follow-up for Phone Call        Baldo Ash at [facility] is rec. that pt be referred to a psychologists for her VCD. He states that they have completed 10 sessions and have only made it to the 2nd step in therapy and he feels pt would benefit form psyc. visits. He just wanted to rec this to PW and if PW ok with this rec then we would need to set up referral. Baldo Ash at rehab aware that Dr. Delford Field is out of office until 09-11-09 and this will be addressed upon his return.  Please advise. Carron Curie CMA  September 05, 2009 11:26 AM   Additional Follow-up for Phone Call Additional follow up Details #1::        this is ok with me  Additional Follow-up by: Storm Frisk MD,  September 07, 2009 1:30 PM    Additional Follow-up for Phone Call Additional follow up Details #2::    order placed. Pt aware. Carron Curie CMA  September 07, 2009 2:08 PM

## 2010-05-01 NOTE — Progress Notes (Signed)
Summary: papers  Phone Note Call from Patient   Caller: Patient Call For: Yacob Wilkerson Summary of Call: pt left forms to be filled out gave to crystal Initial call taken by: Rickard Patience,  Aug 29, 2009 1:37 PM  Follow-up for Phone Call        These forms (authorization to disclose information to the social security administration) have been sent to [facility] to take care of.  Pt aware.  Gweneth Dimitri RN  Aug 29, 2009 1:58 PM

## 2010-05-01 NOTE — Letter (Signed)
Summary: Out of Work  [facility]  [address] [place], [place] [address]   Phone: [phone]  Fax: [phone]    April 27, 2009   Employee:  Brooke Wood    To Whom It May Concern:   For Medical reasons, please excuse the above named employee from work for the following dates:  Start:   04/27/09  End:   05/01/08  If you need additional information, please feel free to contact our office.         Sincerely,    Etta Grandchild MD

## 2010-05-01 NOTE — Progress Notes (Signed)
  Phone Note Other Incoming   Caller: [phone] Summary of Call: Pt called stating that she has had green stools since wens. She did mention that she has increased her fiber intake. Pt is concerned. Initial call taken by: Ami Bullins CMA,  May 05, 2009 11:56 AM  Follow-up for Phone Call        sounds normal to me Follow-up by: Etta Grandchild MD,  May 05, 2009 12:39 PM  Additional Follow-up for Phone Call Additional follow up Details #1::        ok pt informed Additional Follow-up by: Ami Bullins CMA,  May 05, 2009 1:36 PM

## 2010-05-01 NOTE — Assessment & Plan Note (Signed)
Summary: 2 MO ROV /NWS  #   Vital Signs:  Patient profile:   45 year old female Menstrual status:  regular Height:      65 inches Weight:      156 pounds BMI:     26.05 O2 Sat:      98 % on Room air Temp:     98.3 degrees F oral Pulse rate:   89 / minute Pulse rhythm:   regular Resp:     16 per minute BP sitting:   130 / 80  (left arm) Cuff size:   large  Vitals Entered By: Rock Nephew CMA (November 17, 2009 2:35 PM)  Nutrition Counseling: Patient's BMI is greater than 25 and therefore counseled on weight management options.  O2 Flow:  Room air  Primary Care Provider:  Etta Grandchild MD   History of Present Illness:  Hypertension Follow-Up      This is a 45 year old woman who presents for Hypertension follow-up.  The patient denies lightheadedness, urinary frequency, headaches, edema, impotence, and fatigue.  The patient denies the following associated symptoms: chest pain, chest pressure, exercise intolerance, dyspnea, palpitations, syncope, leg edema, and pedal edema.  Compliance with medications (by patient report) has been near 100%.  The patient reports that dietary compliance has been poor.  The patient reports exercising occasionally.  Adjunctive measures currently used by the patient include salt restriction and relaxation.    Preventive Screening-Counseling & Management  Alcohol-Tobacco     Alcohol drinks/day: <1     Smoking Status: never     Passive Smoke Exposure: no  Hep-HIV-STD-Contraception     Hepatitis Risk: no risk noted     HIV Risk: no     STD Risk: no risk noted  Medications Prior to Update: 1)  Diovan Hct 160-25 Mg Tabs (Valsartan-Hydrochlorothiazide) .... Once Daily 2)  Birth Control .... Once Daily 3)  Allegra 180 Mg Tabs (Fexofenadine Hcl) .... One By Mouth Once Daily  Current Medications (verified): 1)  Diovan Hct 160-25 Mg Tabs (Valsartan-Hydrochlorothiazide) .... Once Daily 2)  Birth Control .... Once Daily 3)  Allegra 180 Mg Tabs  (Fexofenadine Hcl) .... One By Mouth Once Daily  Allergies (verified): 1)  ! Asa 2)  ! Codeine 3)  ! * Ryzolt 4)  Codeine Phosphate (Codeine Phosphate)  Past History:  Past Medical History: Last updated: 03/11/2008 Allergic rhinitis     -Neg RAST 2007 Hypertension Vocal Cord Dysfunction     -Neg Methacholine challenge test  2007  Past Surgical History: Last updated: 01/12/2008 Denies surgical history  Family History: Last updated: 01/12/2008 Family History Hypertension  Social History: Last updated: 05/29/2009 Occupation: Unemployed currently Single Never Smoked Alcohol use-yes Drug use-no Regular exercise-no  Risk Factors: Alcohol Use: <1 (11/17/2009) Exercise: no (01/12/2008)  Risk Factors: Smoking Status: never (11/17/2009) Passive Smoke Exposure: no (11/17/2009)  Family History: Reviewed history from 01/12/2008 and no changes required. Family History Hypertension  Social History: Reviewed history from 05/29/2009 and no changes required. Occupation: Unemployed currently Single Never Smoked Alcohol use-yes Drug use-no Regular exercise-no  Review of Systems       The patient complains of weight gain.  The patient denies anorexia, fever, weight loss, chest pain, syncope, dyspnea on exertion, peripheral edema, prolonged cough, headaches, hemoptysis, abdominal pain, hematuria, and suspicious skin lesions.    Physical Exam  General:  Well-developed, well-nourished, in no acute distress; alert and oriented x 3.   Head:  normocephalic and atraumatic.   Mouth:  good dentition, pharynx pink and moist, and no erythema.   Neck:  supple, full ROM, no masses, no thyromegaly, no cervical lymphadenopathy, and no neck tenderness.   Lungs:  normal respiratory effort, no intercostal retractions, no accessory muscle use, normal breath sounds, and no dullness.   Heart:  normal rate, regular rhythm, no murmur, no gallop, and no rub.   Abdomen:  soft, non-tender,  normal bowel sounds, no distention, no masses, no guarding, no rigidity, no hepatomegaly, and no splenomegaly.   Msk:  No deformity or scoliosis noted of thoracic or lumbar spine.   Extremities:  No clubbing, cyanosis, edema, or deformity noted with normal full range of motion of all joints.   Neurologic:  No cranial nerve deficits noted. Station and gait are normal. Plantar reflexes are down-going bilaterally. DTRs are symmetrical throughout. Sensory, motor and coordinative functions appear intact. Skin:  turgor normal, color normal, no rashes, no ecchymoses, no purpura, and no edema.   Cervical Nodes:  No lymphadenopathy noted Psych:  Cognition and judgment appear intact. Alert and cooperative with normal attention span and concentration. No apparent delusions, illusions, hallucinations   Impression & Recommendations:  Problem # 1:  HYPERTENSION (ICD-401.9) Assessment Unchanged  Her updated medication list for this problem includes:    Diovan Hct 160-25 Mg Tabs (Valsartan-hydrochlorothiazide) ..... Once daily  BP today: 130/80 Prior BP: 128/72 (09/18/2009)  Prior 10 Yr Risk Heart Disease: Not enough information (01/12/2008)  Complete Medication List: 1)  Diovan Hct 160-25 Mg Tabs (Valsartan-hydrochlorothiazide) .... Once daily 2)  Birth Control  .... Once daily 3)  Allegra 180 Mg Tabs (Fexofenadine hcl) .... One by mouth once daily  Other Orders: Tdap => 85yrs IM (29937) Admin 1st Vaccine (16967) Radiology Referral (Radiology)  PAP Screening:    Hx Cervical Dysplasia in last 5 yrs? No    3 normal PAP smears in last 5 yrs? Yes    Last PAP smear:  07/19/2009  PAP Smear Results:    Date of Exam:  07/19/2009    Results:  Normal  Osteoporosis Risk Assessment:  Risk Factors for Fracture or Low Bone Density:   Smoking status:       never  Immunization & Chemoprophylaxis:    Tetanus vaccine: Tdap  (11/17/2009)  Patient Instructions: 1)  Please schedule a follow-up  appointment in 6 months. 2)  Check your Blood Pressure regularly. If it is above 140/90: you should make an appointment. Prescriptions: DIOVAN HCT 160-25 MG TABS (VALSARTAN-HYDROCHLOROTHIAZIDE) once daily  #112 x 0   Entered and Authorized by:   Etta Grandchild MD   Signed by:   Etta Grandchild MD on 11/17/2009   Method used:   Samples Given   RxID:   [id number]    Immunizations Administered:  Tetanus Vaccine:    Vaccine Type: Tdap    Site: right deltoid    Mfr: GlaxoSmithKline    Dose: 0.5 ml    Route: IM    Given by: Rock Nephew CMA    Exp. Date: 01/19/2012    Lot #: [id number]    VIS given: 02/17/07 version given November 17, 2009.

## 2010-05-01 NOTE — Progress Notes (Signed)
Summary: Thyroid function normal  Phone Note Outgoing Call   Reason for Call: Discuss lab or test results Summary of Call: Please call pt and tell her all labs are normal with regard to thyroid function. Initial call taken by: Storm Frisk MD,  July 24, 2009 7:43 PM  Follow-up for Phone Call        called, spoke with pt.  Pt informed thyroid labs normal per PW. She verbalized understanding. Gweneth Dimitri RN  July 25, 2009 12:14 PM

## 2010-05-01 NOTE — Progress Notes (Signed)
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  Initial call taken by: Tivis Ringer, CNA,  Aug 09, 2009 10:44 AM  Follow-up for Phone Call        Spoke with pt.  She is requesting "referal to medicaid".  I advised that we do not give referals for this and she should contact social services office to discuss this.  Pt verbalized understanding. Follow-up by: Vernie Murders,  Aug 09, 2009 10:52 AM

## 2010-05-01 NOTE — Progress Notes (Signed)
Summary: Records request  Records request received from the fax machine. Forwarded to [employer] for processing. Lenard Forth  February 10, 2010 12:26 PM

## 2010-05-01 NOTE — Assessment & Plan Note (Signed)
Summary: SEVERE BACK PAIN-LB   Vital Signs:  Patient profile:   45 year old female Menstrual status:  regular LMP:     04/19/2009 Height:      66 inches Weight:      153 pounds BMI:     24.78 O2 Sat:      98 % on Room air Temp:     98.0 degrees F oral Pulse rate:   98 / minute Pulse rhythm:   regular Resp:     16 per minute BP sitting:   122 / 82  (left arm) Cuff size:   large  Vitals Entered By: Rock Nephew CMA (April 27, 2009 2:12 PM)  O2 Flow:  Room air  Primary Care Provider:  Etta Grandchild MD  CC:  Back pain.  History of Present Illness:  Back Pain      This is a 45 year old woman who presents with Back pain.  The symptoms began 1 week ago.  The intensity is described as mild.  The patient reports inability to work, but denies fever, chills, weakness, loss of sensation, fecal incontinence, urinary incontinence, urinary retention, dysuria, rest pain, and inability to care for self.  The pain is located in the right low back.  The pain began at work and after lifting.  The pain radiates to the right flank.  The pain is made worse by flexion and extension.  The pain is made better by inactivity.    Preventive Screening-Counseling & Management  Alcohol-Tobacco     Alcohol drinks/day: <1     Smoking Status: never     Passive Smoke Exposure: no  Hep-HIV-STD-Contraception     Hepatitis Risk: no risk noted     HIV Risk: no     STD Risk: no risk noted      Drug Use:  no.    Current Medications (verified): 1)  Diovan Hct 160-25 Mg Tabs (Valsartan-Hydrochlorothiazide) .... Once Daily 2)  Yaz 3-0.02 Mg Tabs (Drospirenone-Ethinyl Estradiol)  Allergies: 1)  ! Asa 2)  ! Codeine 3)  ! * Ryzolt 4)  Codeine Phosphate (Codeine Phosphate)  Past History:  Past Medical History: Reviewed history from 03/11/2008 and no changes required. Allergic rhinitis     -Neg RAST 2007 Hypertension Vocal Cord Dysfunction     -Neg Methacholine challenge test  2007  Past  Surgical History: Reviewed history from 01/12/2008 and no changes required. Denies surgical history  Family History: Reviewed history from 01/12/2008 and no changes required. Family History Hypertension  Social History: Reviewed history from 01/12/2008 and no changes required. Occupation: [employer]  Single Never Smoked Alcohol use-yes Drug use-no Regular exercise-no Hepatitis Risk:  no risk noted STD Risk:  no risk noted  Review of Systems  The patient denies anorexia, fever, chest pain, peripheral edema, prolonged cough, headaches, hemoptysis, abdominal pain, hematuria, enlarged lymph nodes, and angioedema.   GU:  Denies discharge, dysuria, hematuria, nocturia, urinary frequency, and urinary hesitancy.  Physical Exam  General:  alert, well-developed, well-nourished, well-hydrated, normal appearance, healthy-appearing, cooperative to examination, and good hygiene.   Mouth:  good dentition, pharynx pink and moist, and no erythema.   Neck:  supple, full ROM, no masses, no thyromegaly, no cervical lymphadenopathy, and no neck tenderness.   Lungs:  normal respiratory effort, no intercostal retractions, no accessory muscle use, normal breath sounds, and no dullness.   Heart:  normal rate, regular rhythm, no murmur, no gallop, and no rub.   Abdomen:  soft, non-tender, normal bowel  sounds, no distention, no masses, no guarding, no rigidity, no hepatomegaly, and no splenomegaly.   Msk:  No deformity or scoliosis noted of thoracic or lumbar spine.   Pulses:  R and L carotid,radial,femoral,dorsalis pedis and posterior tibial pulses are full and equal bilaterally Extremities:  No clubbing, cyanosis, edema, or deformity noted with normal full range of motion of all joints.   Neurologic:  No cranial nerve deficits noted. Station and gait are normal. Plantar reflexes are down-going bilaterally. DTRs are symmetrical throughout. Sensory, motor and coordinative functions appear  intact.   Detailed Back/Spine Exam  General:    Well-developed, well-nourished, in no acute distress; alert and oriented x 3.    Gait:    Normal heel-toe gait pattern bilaterally.    Lumbosacral Exam:  Inspection-deformity:    Normal Palpation-spinal tenderness:  Normal Range of Motion:    Forward Flexion:   90 degrees    Hyperextension:   35 degrees    Right Lateral Bend:   35 degrees    Left Lateral Bend:   35 degrees Squatting:  normal Lying Straight Leg Raise:    Right:  negative    Left:  negative Sitting Straight Leg Raise:    Right:  negative    Left:  negative Reverse Straight Leg Raise:    Right:  negative    Left:  negative Contralateral Straight Leg Raise:    Right:  negative    Left:  negative Sciatic Notch:    There is no sciatic notch tenderness. Toe Walking:    Right:  normal    Left:  normal Heel Walking:    Right:  normal    Left:  normal   Impression & Recommendations:  Problem # 1:  BACK PAIN (ICD-724.5) Assessment New  this sounds benign and there is no evidence of radiculopathy, will try tylenol and ibuprofen The following medications were removed from the medication list:    Ryzolt 100 Mg Xr24h-tab (Tramadol hcl) ..... Once daily for pain  Discussed use of moist heat or ice, modified activities, medications, and stretching/strengthening exercises. Back care instructions given. To be seen in 2 weeks if no improvement; sooner if worsening of symptoms.   Problem # 2:  HYPERTENSION (ICD-401.9) Assessment: Improved  Her updated medication list for this problem includes:    Diovan Hct 160-25 Mg Tabs (Valsartan-hydrochlorothiazide) ..... Once daily  BP today: 122/82 Prior BP: 130/86 (02/06/2009)  Prior 10 Yr Risk Heart Disease: Not enough information (01/12/2008)  Complete Medication List: 1)  Diovan Hct 160-25 Mg Tabs (Valsartan-hydrochlorothiazide) .... Once daily 2)  Yaz 3-0.02 Mg Tabs (Drospirenone-ethinyl estradiol)  Patient  Instructions: 1)  Please schedule a follow-up appointment in 1 month. 2)  Take 650-1000mg  of Tylenol every 4-6 hours as needed for relief of pain or comfort of fever AVOID taking more than 4000mg   in a 24 hour period (can cause liver damage in higher doses). 3)  Take 400-600mg  of Ibuprofen (Advil, Motrin) with food every 4-6 hours as needed for relief of pain or comfort of fever. 4)  Most patients (90%) with low back pain will improve with time (2-6 weeks). Keep active but avoid activities that are painful. Apply moist heat and/or ice to lower back several times a day.

## 2010-05-01 NOTE — Progress Notes (Signed)
Summary: Swelling  Phone Note Call from Patient Call back at Home Phone [phone]   Summary of Call: Patient is requesting a call regarding swelling around her left ankle & foot.  Initial call taken by: Lamar Sprinkles, CMA,  December 28, 2009 4:58 PM  Follow-up for Phone Call        Pt c/o swelling only on her left ankle. It is gone upon waking and gets worse throughout the day. Any suggestions that patient can try?  Follow-up by: Lamar Sprinkles, CMA,  December 29, 2009 2:43 PM  Additional Follow-up for Phone Call Additional follow up Details #1::        cone in next week for eval if needed Additional Follow-up by: Etta Grandchild MD,  December 29, 2009 3:57 PM    Additional Follow-up for Phone Call Additional follow up Details #2::    Pt informed  Follow-up by: Lamar Sprinkles, CMA,  December 29, 2009 4:54 PM

## 2010-05-03 NOTE — Assessment & Plan Note (Signed)
Summary: ?allergies/sinus/#/cd   Vital Signs:  Patient profile:   45 year old female Menstrual status:  regular LMP:     04/10/2010 Height:      65 inches Weight:      162 pounds BMI:     27.06 O2 Sat:      98 % on Room air Temp:     98.3 degrees F oral Pulse rate:   90 / minute Pulse rhythm:   regular Resp:     16 per minute BP sitting:   150 / 98 Cuff size:   large  Vitals Entered By: Rock Nephew CMA (April 25, 2010 8:15 AM)  Nutrition Counseling: Patient's BMI is greater than 25 and therefore counseled on weight management options.  O2 Flow:  Room air CC: Patient c/o sinus congestion and pressure, URI symptoms Is Patient Diabetic? No Pain Assessment Patient in pain? no       Does patient need assistance? Functional Status Self care Ambulation Normal LMP (date): 04/10/2010 LMP - Character: normal LMP - Reliable? Yes     Enter LMP: 04/10/2010 Last PAP Result Normal   Primary Care Provider:  Etta Grandchild MD  CC:  Patient c/o sinus congestion and pressure and URI symptoms.  History of Present Illness:  Hypertension Follow-Up      This is a 45 year old woman who presents for Hypertension follow-up.  The patient denies lightheadedness, urinary frequency, headaches, edema, rash, and fatigue.  The patient denies the following associated symptoms: chest pain, chest pressure, exercise intolerance, dyspnea, palpitations, syncope, leg edema, and pedal edema.  Compliance with medications (by patient report) has been poor.  The patient reports that dietary compliance has been poor.  The patient reports no exercise.    URI Symptoms      The patient also presents with URI symptoms.  The symptoms began 4 days ago.  The severity is described as mild.  The patient reports nasal congestion, purulent nasal discharge, sore throat, and dry cough, but denies productive cough, earache, and sick contacts.  The patient denies fever, stiff neck, dyspnea, wheezing, rash, vomiting,  diarrhea, use of an antipyretic, and response to antipyretic.  The patient also reports itchy throat, sneezing, and seasonal symptoms.  The patient denies headache, muscle aches, and severe fatigue.  Risk factors for Strep sinusitis include unilateral facial pain and unilateral nasal discharge.  The patient denies the following risk factors for Strep sinusitis: poor response to decongestant, double sickening, tooth pain, Strep exposure, tender adenopathy, and absence of cough.    Preventive Screening-Counseling & Management  Alcohol-Tobacco     Alcohol drinks/day: <1     Alcohol Counseling: not indicated; patient does not drink     Smoking Status: never     Passive Smoke Exposure: no     Tobacco Counseling: not indicated; no tobacco use  Hep-HIV-STD-Contraception     Hepatitis Risk: no risk noted     HIV Risk: no     STD Risk: no risk noted  Current Medications (verified): 1)  Diovan Hct 160-25 Mg Tabs (Valsartan-Hydrochlorothiazide) .... Once Daily 2)  Birth Control .... Once Daily 3)  Allegra 180 Mg Tabs (Fexofenadine Hcl) .... One By Mouth Once Daily  Allergies (verified): 1)  ! Asa 2)  ! Codeine 3)  ! * Ryzolt 4)  Codeine Phosphate (Codeine Phosphate)  Past History:  Past Medical History: Last updated: 03/11/2008 Allergic rhinitis     -Neg RAST 2007 Hypertension Vocal Cord Dysfunction     -  Neg Methacholine challenge test  2007  Past Surgical History: Last updated: 01/12/2008 Denies surgical history  Family History: Last updated: 01/12/2008 Family History Hypertension  Social History: Last updated: 05/29/2009 Occupation: Unemployed currently Single Never Smoked Alcohol use-yes Drug use-no Regular exercise-no  Risk Factors: Alcohol Use: <1 (04/25/2010) Exercise: no (01/12/2008)  Risk Factors: Smoking Status: never (04/25/2010) Passive Smoke Exposure: no (04/25/2010)  Family History: Reviewed history from 01/12/2008 and no changes required. Family  History Hypertension  Social History: Reviewed history from 05/29/2009 and no changes required. Occupation: Unemployed currently Single Never Smoked Alcohol use-yes Drug use-no Regular exercise-no  Review of Systems       The patient complains of hoarseness.  The patient denies anorexia, fever, weight loss, weight gain, decreased hearing, chest pain, dyspnea on exertion, peripheral edema, headaches, hemoptysis, abdominal pain, hematuria, muscle weakness, suspicious skin lesions, abnormal bleeding, and enlarged lymph nodes.    Physical Exam  General:  Well-developed, well-nourished, in no acute distress; alert and oriented x 3.   Head:  normocephalic and atraumatic.   Eyes:  vision grossly intact, pupils equal, and pupils round.   Ears:  R ear normal and L ear normal.   Nose:  no external deformity, no airflow obstruction, no intranasal foreign body, no nasal polyps, no nasal mucosal lesions, no mucosal friability, no active bleeding or clots, no sinus percussion tenderness, no septum abnormalities, nasal dischargemucosal pallor, mucosal erythema, and mucosal edema.   Mouth:  good dentition, no exudates, no posterior lymphoid hypertrophy, no postnasal drip, no pharyngeal crowing, no lesions, no aphthous ulcers, no erosions, no tongue abnormalities, no leukoplakia, no petechiae, and pharyngeal erythema.   Neck:  supple, full ROM, no masses, no thyromegaly, no cervical lymphadenopathy, and no neck tenderness.   Lungs:  normal respiratory effort, no intercostal retractions, no accessory muscle use, normal breath sounds, and no dullness.   Heart:  normal rate, regular rhythm, no murmur, no gallop, and no rub.   Abdomen:  soft, non-tender, normal bowel sounds, no distention, no masses, no guarding, no rigidity, no hepatomegaly, and no splenomegaly.   Msk:  No deformity or scoliosis noted of thoracic or lumbar spine.   Skin:  turgor normal, color normal, no rashes, no ecchymoses, no purpura, and  no edema.   Cervical Nodes:  No lymphadenopathy noted Axillary Nodes:  No palpable lymphadenopathy Psych:  Cognition and judgment appear intact. Alert and cooperative with normal attention span and concentration. No apparent delusions, illusions, hallucinations   Impression & Recommendations:  Problem # 1:  HYPERTENSION (ICD-401.9) Assessment Deteriorated  Her updated medication list for this problem includes:    Diovan Hct 160-25 Mg Tabs (Valsartan-hydrochlorothiazide) ..... Once daily  BP today: 150/98 Prior BP: 130/80 (11/17/2009)  Prior 10 Yr Risk Heart Disease: Not enough information (01/12/2008)  Problem # 2:  PERS HX NONCOMPLIANCE W/MED TX PRS HAZARDS HLTH (ICD-V15.81) Assessment: New she tells me that she will start taking diovan-hct again  Problem # 3:  SINUSITIS- ACUTE-NOS (ICD-461.9) Assessment: New  Her updated medication list for this problem includes:    Amoxicillin 250 Mg/53ml Recon Sus (Amoxicillin) .Marland Kitchen... Take 10 ml by mouth three (3) times a day x 10 days  Instructed on treatment. Call if symptoms persist or worsen.   Complete Medication List: 1)  Diovan Hct 160-25 Mg Tabs (Valsartan-hydrochlorothiazide) .... Once daily 2)  Birth Control  .... Once daily 3)  Allegra 180 Mg Tabs (Fexofenadine hcl) .... One by mouth once daily 4)  Amoxicillin 250 Mg/63ml Recon Sus (  Amoxicillin) .... Take 10 ml by mouth three (3) times a day x 10 days  Patient Instructions: 1)  Please schedule a follow-up appointment in 2 weeks. 2)  It is important that you exercise regularly at least 20 minutes 5 times a week. If you develop chest pain, have severe difficulty breathing, or feel very tired , stop exercising immediately and seek medical attention. 3)  You need to lose weight. Consider a lower calorie diet and regular exercise.  4)  Check your Blood Pressure regularly. If it is above 140/90: you should make an appointment. 5)  Take your antibiotic as prescribed until ALL of it is  gone, but stop if you develop a rash or swelling and contact our office as soon as possible. 6)  Acute sinusitis symptoms for less than 10 days are not helped by antibiotics.Use warm moist compresses, and over the counter decongestants ( only as directed). Call if no improvement in 5-7 days, sooner if increasing pain, fever, or new symptoms. 7)  If you could be exposed to sexually transmitted diseases, you should use a condom. 8)  If you are having sex and you or your partner don't want a child, use contraception. Prescriptions: DIOVAN HCT 160-25 MG TABS (VALSARTAN-HYDROCHLOROTHIAZIDE) once daily  #112 x 0   Entered and Authorized by:   Etta Grandchild MD   Signed by:   Etta Grandchild MD on 04/25/2010   Method used:   Samples Given   RxID:   [id number] AMOXICILLIN 250 MG/5ML RECON SUS (AMOXICILLIN) Take 10 ml by mouth three (3) times a day X 10 days  #300 ml x 1   Entered and Authorized by:   Etta Grandchild MD   Signed by:   Etta Grandchild MD on 04/25/2010   Method used:   Electronically to        [facility] [phone]* (retail)       [address]       [place], [place]  [address]       Ph: [phone]       Fax: [phone]   RxID:   [id number]    Orders Added: 1)  Est. Patient Level IV [95638]

## 2010-05-03 NOTE — Progress Notes (Signed)
Summary: CALL  Phone Note Call from Patient   Summary of Call: Patient is requesting a call back, has a question about her meds.  Initial call taken by: Lamar Sprinkles, CMA,  April 26, 2010 9:46 AM  Follow-up for Phone Call        Returned call to patient and advised ok to take antibiotic along with other meds.Alvy Beal Archie CMA  April 26, 2010 4:23 PM

## 2010-05-25 ENCOUNTER — Ambulatory Visit: Payer: Self-pay | Admitting: Internal Medicine

## 2010-05-28 ENCOUNTER — Encounter: Payer: Self-pay | Admitting: Internal Medicine

## 2010-05-28 ENCOUNTER — Ambulatory Visit (INDEPENDENT_AMBULATORY_CARE_PROVIDER_SITE_OTHER): Payer: Self-pay | Admitting: Internal Medicine

## 2010-05-28 DIAGNOSIS — I10 Essential (primary) hypertension: Secondary | ICD-10-CM

## 2010-05-28 DIAGNOSIS — J209 Acute bronchitis, unspecified: Secondary | ICD-10-CM

## 2010-06-07 NOTE — Assessment & Plan Note (Signed)
Summary: 6 MO FU / STC /NWS   Vital Signs:  Patient profile:   45 year old female Menstrual status:  regular Height:      65 inches Weight:      161 pounds BMI:     26.89 O2 Sat:      00 % on Room air Temp:     98.3 degrees F oral Pulse rate:   60 / minute Pulse rhythm:   regular Resp:     16 per minute BP sitting:   112 / 70  (left arm) Cuff size:   large  Vitals Entered By: Rock Nephew CMA (May 28, 2010 10:05 AM)  Nutrition Counseling: Patient's BMI is greater than 25 and therefore counseled on weight management options.  O2 Flow:  Room air CC: follow-up visit, URI symptoms, Hypertension Management Is Patient Diabetic? No Pain Assessment Patient in pain? no       Does patient need assistance? Functional Status Self care Ambulation Normal   Primary Care Provider:  Etta Grandchild MD  CC:  follow-up visit, URI symptoms, and Hypertension Management.  History of Present Illness:  URI Symptoms      This is a 45 year old woman who presents with URI symptoms.  The symptoms began 2 weeks ago.  The severity is described as mild.  The patient reports nasal congestion, productive cough, and sick contacts, but denies clear nasal discharge, purulent nasal discharge, sore throat, dry cough, and earache.  The patient denies fever, stiff neck, dyspnea, wheezing, rash, vomiting, diarrhea, use of an antipyretic, and response to antipyretic.  The patient denies itchy throat, sneezing, seasonal symptoms, headache, muscle aches, and severe fatigue.  The patient denies the following risk factors for Strep sinusitis: unilateral facial pain, unilateral nasal discharge, poor response to decongestant, double sickening, tooth pain, Strep exposure, tender adenopathy, and absence of cough.    Hypertension History:      She denies headache, chest pain, palpitations, dyspnea with exertion, orthopnea, PND, peripheral edema, visual symptoms, neurologic problems, syncope, and side effects from  treatment.  She notes no problems with any antihypertensive medication side effects.        Positive major cardiovascular risk factors include hypertension.  Negative major cardiovascular risk factors include female age less than 66 years old, no history of diabetes or hyperlipidemia, negative family history for ischemic heart disease, and non-tobacco-user status.        Positive history for target organ damage include hypertensive retinopathy.  Further assessment for target organ damage reveals no history of ASHD, cardiac end-organ damage (CHF/LVH), stroke/TIA, peripheral vascular disease, or renal insufficiency.     Preventive Screening-Counseling & Management  Alcohol-Tobacco     Alcohol drinks/day: <1     Alcohol Counseling: not indicated; patient does not drink     Smoking Status: never     Passive Smoke Exposure: no     Tobacco Counseling: not indicated; no tobacco use  Hep-HIV-STD-Contraception     Hepatitis Risk: no risk noted     HIV Risk: no     STD Risk: no risk noted      Drug Use:  no.    Clinical Review Panels:  Prevention   Last Pap Smear:  Normal (07/19/2009)  Immunizations   Last Tetanus Booster:  Tdap (11/17/2009)   Medications Prior to Update: 1)  Diovan Hct 160-25 Mg Tabs (Valsartan-Hydrochlorothiazide) .... Once Daily 2)  Birth Control .... Once Daily 3)  Allegra 180 Mg Tabs (Fexofenadine Hcl) .... One  By Mouth Once Daily  Current Medications (verified): 1)  Diovan Hct 160-25 Mg Tabs (Valsartan-Hydrochlorothiazide) .... Once Daily 2)  Birth Control .... Once Daily 3)  Allegra 180 Mg Tabs (Fexofenadine Hcl) .... One By Mouth Once Daily 4)  Zithromax Tri-Pak 500 Mg Tab (Azithromycin) .... Take One By Mouth Once Daily For 3 Days 5)  Robitussin Dm 100-10 Mg/41ml Syrp (Dextromethorphan-Guaifenesin) .... 5-10 Ml By Mouth Qid As Needed For Cough  Allergies (verified): 1)  ! Asa 2)  ! Codeine 3)  ! * Ryzolt 4)  Codeine Phosphate (Codeine Phosphate)  Past  History:  Past Medical History: Last updated: 03/11/2008 Allergic rhinitis     -Neg RAST 2007 Hypertension Vocal Cord Dysfunction     -Neg Methacholine challenge test  2007  Past Surgical History: Last updated: 01/12/2008 Denies surgical history  Family History: Last updated: 01/12/2008 Family History Hypertension  Social History: Last updated: 05/28/2010 Occupation: [employer] Single Never Smoked Alcohol use-yes Drug use-no Regular exercise-no  Risk Factors: Alcohol Use: <1 (05/28/2010) Exercise: no (01/12/2008)  Risk Factors: Smoking Status: never (05/28/2010) Passive Smoke Exposure: no (05/28/2010)  Family History: Reviewed history from 01/12/2008 and no changes required. Family History Hypertension  Social History: Reviewed history from 05/29/2009 and no changes required. Occupation: Agricultural engineer Never Smoked Alcohol use-yes Drug use-no Regular exercise-no  Review of Systems  The patient denies anorexia, fever, weight loss, weight gain, chest pain, syncope, dyspnea on exertion, peripheral edema, prolonged cough, headaches, hemoptysis, abdominal pain, hematuria, suspicious skin lesions, enlarged lymph nodes, and angioedema.    Physical Exam  General:  Well-developed, well-nourished, in no acute distress; alert and oriented x 3.   Head:  normocephalic and atraumatic.   Eyes:  vision grossly intact, pupils equal, and pupils round.   Ears:  R ear normal and L ear normal.   Nose:  no external deformity, no nasal discharge, no airflow obstruction, no intranasal foreign body, no nasal polyps, no nasal mucosal lesions, no mucosal friability, no active bleeding or clots, no sinus percussion tenderness, no septum abnormalities, mucosal pallor, and mucosal edema.   Mouth:  Oral mucosa and oropharynx without lesions or exudates.  Teeth in good repair. Neck:  supple, full ROM, no masses, no thyromegaly, no cervical lymphadenopathy, and no neck  tenderness.   Lungs:  normal respiratory effort, no intercostal retractions, no accessory muscle use, normal breath sounds, and no dullness.   Heart:  normal rate, regular rhythm, no murmur, no gallop, and no rub.   Abdomen:  soft, non-tender, normal bowel sounds, no distention, no masses, no guarding, no rigidity, no hepatomegaly, and no splenomegaly.   Msk:  No deformity or scoliosis noted of thoracic or lumbar spine.   Pulses:  R and L carotid,radial,femoral,dorsalis pedis and posterior tibial pulses are full and equal bilaterally Extremities:  No clubbing, cyanosis, edema, or deformity noted with normal full range of motion of all joints.   Neurologic:  No cranial nerve deficits noted. Station and gait are normal. Plantar reflexes are down-going bilaterally. DTRs are symmetrical throughout. Sensory, motor and coordinative functions appear intact. Skin:  turgor normal, color normal, no rashes, no ecchymoses, no purpura, and no edema.   Cervical Nodes:  No lymphadenopathy noted Psych:  Cognition and judgment appear intact. Alert and cooperative with normal attention span and concentration. No apparent delusions, illusions, hallucinations   Impression & Recommendations:  Problem # 1:  BRONCHITIS-ACUTE (ICD-466.0) Assessment New  Her updated medication list for this problem includes:    Zithromax  Tri-pak 500 Mg Tab (Azithromycin) .Marland Kitchen... Take one by mouth once daily for 3 days    Robitussin Dm 100-10 Mg/30ml Syrp (Dextromethorphan-guaifenesin) .Marland Kitchen... 5-10 ml by mouth qid as needed for cough  Take antibiotics and other medications as directed. Encouraged to push clear liquids, get enough rest, and take acetaminophen as needed. To be seen in 5-7 days if no improvement, sooner if worse.  Problem # 2:  HYPERTENSION (ICD-401.9) Assessment: Improved  Her updated medication list for this problem includes:    Diovan Hct 160-25 Mg Tabs (Valsartan-hydrochlorothiazide) ..... Once daily  BP today:  112/70 Prior BP: 150/98 (04/25/2010)  Prior 10 Yr Risk Heart Disease: Not enough information (01/12/2008)  Complete Medication List: 1)  Diovan Hct 160-25 Mg Tabs (Valsartan-hydrochlorothiazide) .... Once daily 2)  Birth Control  .... Once daily 3)  Allegra 180 Mg Tabs (Fexofenadine hcl) .... One by mouth once daily 4)  Zithromax Tri-pak 500 Mg Tab (Azithromycin) .... Take one by mouth once daily for 3 days 5)  Robitussin Dm 100-10 Mg/80ml Syrp (Dextromethorphan-guaifenesin) .... 5-10 ml by mouth qid as needed for cough  Hypertension Assessment/Plan:      The patient's hypertensive risk group is category C: Target organ damage and/or diabetes.  Today's blood pressure is 112/70.  Her blood pressure goal is < 140/90.  Patient Instructions: 1)  Please schedule a follow-up appointment in 3 months. 2)  It is important that you exercise regularly at least 20 minutes 5 times a week. If you develop chest pain, have severe difficulty breathing, or feel very tired , stop exercising immediately and seek medical attention. 3)  You need to lose weight. Consider a lower calorie diet and regular exercise.  4)  Check your Blood Pressure regularly. If it is above 140/90: you should make an appointment. 5)  Take your antibiotic as prescribed until ALL of it is gone, but stop if you develop a rash or swelling and contact our office as soon as possible. 6)  Acute bronchitis symptoms for less than 10 days are not helped by antibiotics. take over the counter cough medications. call if no improvment in  5-7 days, sooner if increasing cough, fever, or new symptoms( shortness of breath, chest pain). Prescriptions: ROBITUSSIN DM 100-10 MG/5ML SYRP (DEXTROMETHORPHAN-GUAIFENESIN) 5-10 ml by mouth QID as needed for cough  #8 ounces x 0   Entered and Authorized by:   Etta Grandchild MD   Signed by:   Etta Grandchild MD on 05/28/2010   Method used:   Electronically to        [facility] [phone]* (retail)        [address]       [place], [place]  [address]       Ph: [phone]       Fax: [phone]   RxID:   [id number] ZITHROMAX TRI-PAK 500 MG TAB (AZITHROMYCIN) Take one by mouth once daily for 3 days  #3 x 0   Entered and Authorized by:   Etta Grandchild MD   Signed by:   Etta Grandchild MD on 05/28/2010   Method used:   Electronically to        [facility] [phone]* (retail)       [address]       [place], [place]  [address]       Ph: [phone]       Fax: [phone]   RxID:   [id number]    Orders Added: 1)  Est. Patient Level IV [id number]

## 2010-07-04 ENCOUNTER — Ambulatory Visit: Payer: Self-pay | Admitting: Psychology

## 2010-07-19 ENCOUNTER — Encounter: Payer: Self-pay | Admitting: Internal Medicine

## 2010-07-19 ENCOUNTER — Other Ambulatory Visit (INDEPENDENT_AMBULATORY_CARE_PROVIDER_SITE_OTHER): Payer: Self-pay

## 2010-07-19 ENCOUNTER — Ambulatory Visit (INDEPENDENT_AMBULATORY_CARE_PROVIDER_SITE_OTHER): Payer: Self-pay | Admitting: Internal Medicine

## 2010-07-19 DIAGNOSIS — I498 Other specified cardiac arrhythmias: Secondary | ICD-10-CM

## 2010-07-19 DIAGNOSIS — E049 Nontoxic goiter, unspecified: Secondary | ICD-10-CM

## 2010-07-19 DIAGNOSIS — J309 Allergic rhinitis, unspecified: Secondary | ICD-10-CM

## 2010-07-19 DIAGNOSIS — I10 Essential (primary) hypertension: Secondary | ICD-10-CM

## 2010-07-19 LAB — CBC WITH DIFFERENTIAL/PLATELET
Basophils Relative: 0.5 % (ref 0.0–3.0)
Eosinophils Absolute: 0 10*3/uL (ref 0.0–0.7)
Eosinophils Relative: 0.6 % (ref 0.0–5.0)
Lymphocytes Relative: 38.4 % (ref 12.0–46.0)
MCV: 89.7 fl (ref 78.0–100.0)
Monocytes Absolute: 0.6 10*3/uL (ref 0.1–1.0)
Monocytes Relative: 7.7 % (ref 3.0–12.0)
Neutro Abs: 3.8 10*3/uL (ref 1.4–7.7)
Neutrophils Relative %: 52.8 % (ref 43.0–77.0)
Platelets: 256 10*3/uL (ref 150.0–400.0)
RBC: 4.38 Mil/uL (ref 3.87–5.11)
RDW: 14.2 % (ref 11.5–14.6)

## 2010-07-19 LAB — BASIC METABOLIC PANEL
Calcium: 9.7 mg/dL (ref 8.4–10.5)
Creatinine, Ser: 0.6 mg/dL (ref 0.4–1.2)

## 2010-07-19 MED ORDER — MONTELUKAST SODIUM 10 MG PO TABS: 10.0000 mg | ORAL_TABLET | Freq: Every day | ORAL | Status: DC

## 2010-07-19 MED ORDER — MOMETASONE FUROATE 50 MCG/ACT NA SUSP: 2.0000 | Freq: Every day | NASAL | Status: DC

## 2010-07-19 NOTE — Patient Instructions (Signed)
Hypertension (High Blood Pressure) As your heart beats, it forces blood through your arteries. This force is your blood pressure. If the pressure is too high, it is called hypertension (HTN) or high blood pressure. HTN is dangerous because you may have it and not know it. High blood pressure may mean that your heart has to work harder to pump blood. Your arteries may be narrow or stiff. The extra work puts you at risk for heart disease, stroke, and other problems.  Blood pressure consists of two numbers, a higher number over a lower, 110/72, for example. It is stated as "110 over 72." The ideal is below 120 for the top number (systolic) and under 80 for the bottom (diastolic). Write down your blood pressure today. You should pay close attention to your blood pressure if you have certain conditions such as:  Heart failure.  Prior heart attack.   Diabetes   Chronic kidney disease.   Prior stroke.   Multiple risk factors for heart disease.   To see if you have HTN, your blood pressure should be measured while you are seated with your arm held at the level of the heart. It should be measured at least twice. A one-time elevated blood pressure reading (especially in the Emergency Department) does not mean that you need treatment. There may be conditions in which the blood pressure is different between your right and left arms. It is important to see your caregiver soon for a recheck. Most people have essential hypertension which means that there is not a specific cause. This type of high blood pressure may be lowered by changing lifestyle factors such as:  Stress.  Smoking.   Lack of exercise.   Excessive weight.  Drug/tobacco/alcohol use.   Eating less salt.   Most people do not have symptoms from high blood pressure until it has caused damage to the body. Effective treatment can often prevent, delay or reduce that damage. TREATMENT Treatment for high blood pressure, when a cause has been  identified, is directed at the cause. There are a large number of medications to treat HTN. These fall into several categories, and your caregiver will help you select the medicines that are best for you. Medications may have side effects. You should review side effects with your caregiver. If your blood pressure stays high after you have made lifestyle changes or started on medicines,   Your medication(s) may need to be changed.   Other problems may need to be addressed.   Be certain you understand your prescriptions, and know how and when to take your medicine.   Be sure to follow up with your caregiver within the time frame advised (usually within two weeks) to have your blood pressure rechecked and to review your medications.   If you are taking more than one medicine to lower your blood pressure, make sure you know how and at what times they should be taken. Taking two medicines at the same time can result in blood pressure that is too low.  SEEK IMMEDIATE MEDICAL CARE IF YOU DEVELOP:  A severe headache, blurred or changing vision, or confusion.   Unusual weakness or numbness, or a faint feeling.   Severe chest or abdominal pain, vomiting, or breathing problems.  MAKE SURE YOU:   Understand these instructions.   Will watch your condition.   Will get help right away if you are not doing well or get worse.  Document Released: 03/18/2005 Document Re-Released: 09/05/2009 ExitCare Patient Information 2011 [employer]   LL[employer].Allergic Rhinitis Allergic rhinitis is when the mucous membranes in the nose respond to allergens. Allergens are particles in the air that cause your body to have an allergic reaction. This causes you to release allergic antibodies. Through a chain of events, these eventually cause you to release histamine into the blood stream (hence the use of antihistamines). Although meant to be protective to the body, it is this release that causes your discomfort, such as frequent  sneezing, congestion and an itchy runny nose.  CAUSES The pollen allergens may come from grasses, trees, and weeds. This is seasonal allergic rhinitis, or "hay fever." Other allergens cause year-round allergic rhinitis (perennial allergic rhinitis) such as house dust mite allergen, pet dander and mold spores.  SYMPTOMS  Nasal stuffiness (congestion).   Runny, itchy nose with sneezing and tearing of the eyes.   There is often an itching of the mouth, eyes and ears.  It cannot be cured, but it can be controlled with medications. DIAGNOSIS If you are unable to determine the offending allergen, skin or blood testing may find it. TREATMENT  Avoid the allergen.   Medications and allergy shots (immunotherapy) can help.   Hay fever may often be treated with antihistamines in pill or nasal spray forms. Antihistamines block the effects of histamine. There are over-the-counter medicines that may help with nasal congestion and swelling around the eyes. Check with your caregiver before taking or giving this medicine.  If the treatment above does not work, there are many new medications your caregiver can prescribe. Stronger medications may be used if initial measures are ineffective. Desensitizing injections can be used if medications and avoidance fails. Desensitization is when a patient is given ongoing shots until the body becomes less sensitive to the allergen. Make sure you follow up with your caregiver if problems continue. SEEK MEDICAL CARE IF:   You develop fever (more than 100.5F (38.1 C).   You develop a cough that does not stop easily (persistent).   You have shortness of breath.   You start wheezing.   Symptoms interfere with normal daily activities.  Document Released: 12/11/2000 Document Re-Released: 04/09/2009 ExitCare Patient Information 2011 [employer]. 

## 2010-07-19 NOTE — Assessment & Plan Note (Signed)
This is unchanged and maybe improved, she has no s/s

## 2010-07-19 NOTE — Assessment & Plan Note (Signed)
Her BP is well controlled today I will check her lytes and renal function 

## 2010-07-19 NOTE — Progress Notes (Signed)
Subjective:    Patient ID: Brooke Wood, female    DOB: 22-Apr-1965, 45 y.o.   MRN: [id number]  Hypertension This is a chronic problem. The current episode started more than 1 year ago. The problem has been rapidly improving since onset. The problem is controlled. Pertinent negatives include no anxiety, blurred vision, chest pain, headaches, malaise/fatigue, neck pain, orthopnea, palpitations, peripheral edema, PND, shortness of breath or sweats. There are no associated agents to hypertension. Past treatments include angiotensin blockers and diuretics. The current treatment provides significant improvement. There are no compliance problems.       Review of Systems  Constitutional: Negative for malaise/fatigue.  HENT: Positive for congestion, rhinorrhea, sneezing and postnasal drip. Negative for hearing loss, ear pain, nosebleeds, sore throat, facial swelling, trouble swallowing, neck pain, neck stiffness, voice change, sinus pressure and tinnitus.   Eyes: Negative for blurred vision, pain, discharge, redness and itching.  Respiratory: Negative for apnea, cough, choking, chest tightness, shortness of breath, wheezing and stridor.   Cardiovascular: Negative for chest pain, palpitations, orthopnea, leg swelling and PND.  Gastrointestinal: Negative for nausea, vomiting, abdominal pain, diarrhea, constipation, blood in stool and abdominal distention.  Genitourinary: Negative for dysuria, urgency, frequency, hematuria, decreased urine volume, difficulty urinating and dyspareunia.  Skin: Negative for color change, pallor and rash.  Neurological: Negative for dizziness, tremors, seizures, syncope, facial asymmetry, speech difficulty, weakness, light-headedness, numbness and headaches.  Psychiatric/Behavioral: Negative for behavioral problems, self-injury, decreased concentration and agitation. The patient is not nervous/anxious.        Lab Results  Component Value Date   TSH 0.77 07/24/2009    Objective:   Physical Exam  Constitutional: She is oriented to person, place, and time. She appears well-developed and well-nourished. No distress.  HENT:  Head: Normocephalic and atraumatic.  Right Ear: External ear normal.  Left Ear: External ear normal.  Nose: Mucosal edema and rhinorrhea present. No nose lacerations, sinus tenderness, nasal deformity, septal deviation or nasal septal hematoma. No epistaxis.  No foreign bodies. Right sinus exhibits no maxillary sinus tenderness and no frontal sinus tenderness. Left sinus exhibits no maxillary sinus tenderness and no frontal sinus tenderness.  Mouth/Throat: Oropharynx is clear and moist. No oropharyngeal exudate.  Eyes: Conjunctivae and EOM are normal. Pupils are equal, round, and reactive to light. Right eye exhibits no discharge. Left eye exhibits no discharge. No scleral icterus.  Neck: Normal range of motion. Neck supple. No JVD present. No tracheal deviation present. No thyromegaly present.  Cardiovascular: Normal rate, regular rhythm, normal heart sounds and intact distal pulses.  Exam reveals no gallop and no friction rub.   No murmur heard. Pulmonary/Chest: Effort normal and breath sounds normal. No stridor. No respiratory distress. She has no wheezes. She has no rales. She exhibits no tenderness.  Abdominal: Soft. Bowel sounds are normal. She exhibits no distension and no mass. There is no tenderness. There is no rebound and no guarding.  Musculoskeletal: Normal range of motion. She exhibits no edema and no tenderness.  Lymphadenopathy:    She has no cervical adenopathy.  Neurological: She is alert and oriented to person, place, and time. She has normal reflexes. No cranial nerve deficit. She exhibits normal muscle tone. Coordination normal.  Skin: Skin is warm and dry. No rash noted. She is not diaphoretic. No erythema. No pallor.  Psychiatric: She has a normal mood and affect. Her behavior is normal. Judgment and thought content  normal.          Assessment & Plan:

## 2010-07-19 NOTE — Assessment & Plan Note (Signed)
This appears to have resolved based on her absence of symptoms and exam today. I will check a TSH today to look for T function

## 2010-07-19 NOTE — Progress Notes (Signed)
Addended by: Rock Nephew on: 07/19/2010 02:52 PM   Modules accepted: Orders

## 2010-07-19 NOTE — Assessment & Plan Note (Signed)
Stay on allegra and start singulair and nasonex ns, she was given pt ed material

## 2010-08-14 NOTE — Assessment & Plan Note (Signed)
[facility]                             PULMONARY OFFICE NOTE   NAME:Shi, FELEICA FULMORE                        MRN:          [id number]  DATE:09/03/2006                            DOB:          22-Oct-1965    Ms. Inocencio returns today in followup. Has history of vocal cord  dysfunction syndrome.  No true asthma. Allergic rhinitis.   She is on Veramyst 2 sprays each nostril daily and Zyrtec 10 mg daily.  Doing well with this. She still has episodes of dyspnea when she is  exposed to smoke and fumes at work.   EXAMINATION:  VITAL SIGNS:  Temperature 98, blood pressure 120/80, pulse  85, saturation 99% on room air.  CHEST:  Completely clear to auscultation and percussion.  NARES:  Showed mild nasal inflammation.  OROPHARYNX:  Clear.  CARDIAC EXAM:  Showed a regular rate and rhythm.  ABDOMEN:  Soft, nontender.  EXTREMITIES:  Showed no edema or clubbing.   IMPRESSION:  Allergic rhinitis. No true asthma. Vocal cord dysfunction  syndrome.   PLAN:  Maintain Veramyst and Zyrtec as is and will see the patient back  in followup as needed.     Charlcie Cradle Delford Field, MD, [facility]  Electronically Signed    PEW/MedQ  DD: 09/03/2006  DT: 09/03/2006  Job #: [id number]   cc:   Barbette Hair. Artist Pais, DO

## 2010-08-14 NOTE — Assessment & Plan Note (Signed)
[facility]                             PULMONARY OFFICE NOTE   NAME:Drummonds, TEARRA OUK                        MRN:          [id number]  DATE:03/03/2007                            DOB:          [birth year]    HISTORY OF PRESENT ILLNESS:  Patient is a 45 year old African-American  female patient of Dr. Lynelle Doctor who has a known history of vocal cord  dysfunction syndrome and allergic rhinitis.  The patient presents with a  three day history of nasal congestion, postnasal drip, and hoarseness.  Patient denies any purulent sputum, fever, chest pain, orthopnea, PND,  or leg swelling.   PAST MEDICAL HISTORY:  Reviewed.   CURRENT MEDICATIONS:  Reviewed.   PHYSICAL EXAMINATION:  Patient is a pleasant female in no acute  distress.  She is afebrile with stable vital signs.  O2 saturation is 98% on room  air.  HEENT:  Nasal mucosa with some mild erythema.  Nontender sinuses.  Posterior pharynx is clear.  NECK:  Supple without cervical adenopathy.  There is no JVD.  LUNGS:  The lung sounds are clear.  CARDIAC:  Regular rate.  ABDOMEN:  Soft, nontender.  Marland Kitchen  EXTREMITIES:  Warm without any edema.   IMPRESSION/PLAN:  Mild rhinitis flare.  Patient is to begin Nasacort AQ  2 puffs twice daily.  Xyzal 5 mg at bedtime.  Mucinex DM twice daily.  May use Tylenol as needed.  Patient is to return back with Dr. Delford Field as  scheduled or sooner as needed.      Rubye Oaks, NP  Electronically Signed      Charlcie Cradle Delford Field, MD, [facility]  Electronically Signed   TP/MedQ  DD: 03/03/2007  DT: 03/03/2007  Job #: [id number]

## 2010-08-17 NOTE — Assessment & Plan Note (Signed)
 HEALTHCARE                             PULMONARY OFFICE NOTE   NAME:Lumm, TEARRA OUK                        MRN:          [id number]  DATE:06/30/2006                            DOB:          February 07, 1966    Ms. Wind is seen today in return followup. Has a cyclic cough, increased  hoarseness, some wheezing, increase post-nasal drip and sneezing. She  has Zyrtec she is taking 10 mg daily and Veramyst two sprays each  nostril daily.   PHYSICAL EXAMINATION:  Temperature 97.0, blood pressure 146/98, pulse  95, saturation was 99% on room air.  CHEST: Showed to be completely clear with no evidence of wheeze or  rhonchi.  Nares show increased nasal inflammation with mild nasal inflammatory  changes.  Oropharynx was clear.   IMPRESSION:  Is that of no evidence of asthma, but significant allergic  rhinitis.   PLAN:  Is to switch Zyrtec to Xyzal 5 mg daily and increase Veramyst to  two sprays each nostril twice daily for five days and reduce to daily  thereafter. Discontinue her Zyrtec and utilize Delsym p.r.n. cough.     Charlcie Cradle Delford Field, MD, [facility]  Electronically Signed    PEW/MedQ  DD: 06/30/2006  DT: 06/30/2006  Job #: [id number]   cc:   Barbette Hair. Artist Pais, DO

## 2010-08-17 NOTE — Assessment & Plan Note (Signed)
[place] [facility]                               PULMONARY OFFICE NOTE   NAME:Brooke [name]                        MRN:          [id number]  DATE:01/07/2006                            DOB:          [birth year]    CHIEF COMPLAINT:  Evaluate asthma.   HISTORY OF PRESENT ILLNESS:  This is a 45 year old African American female  who complains of dyspnea for the past year, getting progressively worse.  She is short of breath with exertion.  No real cough, no chest pain, no  hemoptysis.  She notes increased post nasal drainage.  Occasional wheezing.  Her voice is hoarse.  She has no acid symptoms.  She is averaging using her  Albuterol inhaler 1-2 times daily.  It is worse when exposed to fumes and  strong odors.  She is a lifelong never smoker.  She is referred for  evaluation of asthma.  She has had previous skin testing showing positivity  to dust, mold and mildew.   PAST MEDICAL HISTORY:  1. Hypertension.  2. Asthma.  3. History of chronic allergies.  4. No history of diabetes or increased cholesterol.  5. No history of strokes, blood clots, cancer, renal disease, sleep      disorder or disorders to the Central Nervous System.   PAST SURGICAL HISTORY:  Breast cyst removal.   ALLERGIES:  None.   MEDICATIONS:  1. Zyrtec 10 mg daily.  2. Advair 250/50 one spray  b.i.d.  3. Astelin 1-2 sprays each nostril b.i.d.  4. Optivar b.i.d.  5. Singulair 10 mg daily.  6. Albuterol p.r.n.   SOCIAL HISTORY:  Lifelong never smoker.  Works at [employer] as a Conservation officer, nature.  Has children, is single.   FAMILY HISTORY:  Noncontributory.   REVIEW OF SYSTEMS:  Otherwise, noncontributory.   PHYSICAL EXAMINATION:  GENERAL:  This is a middle age [place] American  female in no distress.  VITAL SIGNS:  Temperature 99.4, blood pressure 120/84, pulse 82, saturation  99% on room air.  CHEST:  Distant breath sounds with no evidence of wheeze or rhonchi.  No  rales.  CARDIAC:   Regular rate and rhythm without S3, normal S1, S2.  No murmurs,  rubs or gallops.  ABDOMEN:  Soft, nontender with no organomegaly.  SKIN:  Clear.  HEENT:  No jugular venous distention.  Nares showed mild nasal inflammation.   IMPRESSION:  Probable severe persistent asthma, but also cannot rule out  vocal cord dysfunction syndrome and significant reflux disease contributing  to above.   PLAN:  Obtain a chest x-ray . We have already done this and it showed no  active disease process.  Will increase Advair to 500/50 one spray b.i.d.  Will begin Veramyst one spray each nostril daily.  Will check an IgE and  RAST assay.  Discontinue Astelin.  Continue Allegra or Zyrtec.  Obtain a CT  scan of the sinuses and obtain full set of pulmonary function studies.  Once  the above are available, further recommendations will follow.       Charlcie Cradle Delford Field, MD, FCCP  PEW/MedQ  DD:  01/07/2006  DT:  01/09/2006  Job #:  [id number]

## 2010-08-17 NOTE — Assessment & Plan Note (Signed)
[place] [facility]                             PULMONARY OFFICE NOTE   NAME:Shrewsbury, BO ROGUE                        MRN:          [id number]  DATE:02/26/2006                            DOB:          14-Nov-1965    Brooke [name] is a 45 year old African-American female with a history of  dyspnea and wheezing hoarseness. She has been labeled as having asthma  since an emergency room visit in 2006. We performed a methacholine  challenge test on this patient based on the fact that she had normal  spirometry and pulmonary functions. The results of this are negative for  asthma and no change with methacholine. She still maintains the Advair  500/50, Singulair 10 mg daily and Albuterol p.r.n., Veramyst daily, and  Astelin daily, Zyrtec daily.   PHYSICAL EXAMINATION:  VITAL SIGNS:  Temperature is 97, blood pressure  120/74, pulse 91, saturation 99% on room air.  CHEST:  Showed to be completely clear without evidence of wheeze or  rhonchi.   The rest of the exam is unremarkable.   IMPRESSION:  The patient does not have true asthma, instead she has  vocal cord dysfunction syndrome. She may have occult reflux. She clearly  has anxiety neuroses and appears to be seeking disability. She currently  works at [employer]. I told her that as long as she avoids strong fumes  and odors she should be employable and is not eligible for disability at  this time. She is to discontinue the Advair, albuterol and Singulair and  to maintain the Zyrtec daily and the Flonase daily for her upper airway  rhinitis postnasal drip syndrome. She will return to this office on a  p.r.n. basis. Followup will be with her primary care physician Dr.  Thomos Lemons.     Brooke Cradle Delford Field, MD, [facility]  Electronically Signed    PEW/MedQ  DD: 02/26/2006  DT: 02/26/2006  Job #: [id number]   cc:   Barbette Hair. Artist Pais, DO

## 2010-08-17 NOTE — Assessment & Plan Note (Signed)
[place] [facility]                               PULMONARY OFFICE NOTE   NAME:Galli, Brooke [name]                        MRN:          [id number]  DATE:02/06/2006                            DOB:          [birth year]    [name] returns today in followup for evaluation of dyspnea.  We are still  unclear as to whether this patient has asthma or not.  We recently increased  her Advair to 500/50 1 spray b.i.d.  She is on the Veramyst 2 sprays each  nostril daily.  IgE levels were normal.  RAST assay was negative.  We  obtained a chest x-ray, which was unremarkable.  Sinus CT scan showed no  sinusitis.  Pulmonary functions were not very interpretable, as she did not  cooperate with testing.  She maintains the Singulair 10 mg daily.  She is  off Astelin, maintains Zyrtec 10 mg daily.  She states fumes bother her.  Most of her symptoms appear to be referable to the upper respiratory tract,  and do not appear to be true asthma symptoms.   PHYSICAL EXAMINATION:  Temp 98, blood pressure 120/70, pulse 77, saturation  100% on room air.  CHEST:  Showed to be clear without evidence of wheeze, rale or rhonchi.  CARDIAC:  Exam showed a regular rate and rhythm without S3, normal S1, S2.  ABDOMEN:  Soft, nontender.  EXTREMITIES:  Showed no edema or clubbing.  SKIN:  Clear.   IMPRESSION:  1. Upper airway dysfunction.  2. Vocal cord dysfunction syndrome.  3. Chronic rhinitis, but no true evidence of pure asthma.   In order to confirm or deny this, we are going to proceed with a  methacholine challenge test.  Once the results of this are available,  further recommendations will follow.     Charlcie Cradle Delford Field, MD, [facility]  Electronically Signed    PEW/MedQ  DD: 02/06/2006  DT: 02/07/2006  Job #: [id number]   cc:   Barbette Hair. Artist Pais, DO

## 2010-08-28 ENCOUNTER — Ambulatory Visit: Payer: Self-pay | Admitting: Internal Medicine

## 2010-10-02 LAB — HM PAP SMEAR: HM Pap smear: NORMAL

## 2010-11-19 ENCOUNTER — Ambulatory Visit: Payer: Self-pay | Admitting: Internal Medicine

## 2010-11-19 ENCOUNTER — Ambulatory Visit (INDEPENDENT_AMBULATORY_CARE_PROVIDER_SITE_OTHER): Payer: Self-pay | Admitting: Internal Medicine

## 2010-11-19 ENCOUNTER — Encounter: Payer: Self-pay | Admitting: Internal Medicine

## 2010-11-19 DIAGNOSIS — J309 Allergic rhinitis, unspecified: Secondary | ICD-10-CM

## 2010-11-19 DIAGNOSIS — I10 Essential (primary) hypertension: Secondary | ICD-10-CM

## 2010-11-19 MED ORDER — FEXOFENADINE HCL 180 MG PO TABS: 180.0000 mg | ORAL_TABLET | Freq: Every day | ORAL | Status: DC | PRN

## 2010-11-19 MED ORDER — MONTELUKAST SODIUM 10 MG PO TABS: 10.0000 mg | ORAL_TABLET | Freq: Every day | ORAL | Status: DC

## 2010-11-19 NOTE — Patient Instructions (Signed)

## 2010-11-19 NOTE — Progress Notes (Signed)
Subjective:    Patient ID: Brooke Wood, female    DOB: 03/07/1966, 45 y.o.   MRN: [id number]  Hypertension This is a chronic problem. The current episode started more than 1 year ago. The problem is unchanged. The problem is controlled. Pertinent negatives include no anxiety, blurred vision, chest pain, headaches, malaise/fatigue, neck pain, orthopnea, palpitations, peripheral edema, PND, shortness of breath or sweats. Past treatments include angiotensin blockers and diuretics. The current treatment provides significant improvement. There are no compliance problems.       Review of Systems  Constitutional: Negative for fever, chills, malaise/fatigue, diaphoresis, activity change, appetite change, fatigue and unexpected weight change.  HENT: Positive for congestion, rhinorrhea, sneezing and postnasal drip. Negative for hearing loss, ear pain, nosebleeds, sore throat, facial swelling, drooling, mouth sores, trouble swallowing, neck pain, neck stiffness, dental problem, voice change, sinus pressure, tinnitus and ear discharge.   Eyes: Negative for blurred vision, photophobia, pain, discharge, redness, itching and visual disturbance.  Respiratory: Negative for apnea, cough, choking, chest tightness, shortness of breath, wheezing and stridor.   Cardiovascular: Negative for chest pain, palpitations, orthopnea, leg swelling and PND.  Gastrointestinal: Negative for nausea, vomiting, abdominal pain, diarrhea, constipation, blood in stool and abdominal distention.  Genitourinary: Negative for dysuria, urgency, frequency, decreased urine volume, enuresis, difficulty urinating and dyspareunia.  Musculoskeletal: Negative for myalgias, back pain, joint swelling, arthralgias and gait problem.  Skin: Negative for color change, pallor, rash and wound.  Neurological: Negative for dizziness, tremors, seizures, syncope, facial asymmetry, speech difficulty, weakness, light-headedness, numbness and headaches.    Hematological: Negative for adenopathy. Does not bruise/bleed easily.  Psychiatric/Behavioral: Negative.        Objective:   Physical Exam  Vitals reviewed. Constitutional: Brooke Wood is oriented to person, place, and time. Brooke Wood appears well-developed and well-nourished. No distress.  HENT:  Head: Normocephalic and atraumatic. No trismus in the jaw.  Right Ear: Hearing, tympanic membrane, external ear and ear canal normal.  Left Ear: Hearing, tympanic membrane, external ear and ear canal normal.  Nose: Mucosal edema (pale) and rhinorrhea present. No nose lacerations, sinus tenderness, nasal deformity, septal deviation or nasal septal hematoma. No epistaxis.  No foreign bodies. Right sinus exhibits no maxillary sinus tenderness and no frontal sinus tenderness. Left sinus exhibits no maxillary sinus tenderness and no frontal sinus tenderness.  Mouth/Throat: Oropharynx is clear and moist and mucous membranes are normal. Mucous membranes are not pale, not dry and not cyanotic. No uvula swelling. No oropharyngeal exudate, posterior oropharyngeal edema, posterior oropharyngeal erythema or tonsillar abscesses.  Eyes: Conjunctivae and EOM are normal. Pupils are equal, round, and reactive to light. Right eye exhibits no discharge. Left eye exhibits no discharge. No scleral icterus.  Neck: Normal range of motion. Neck supple. No JVD present. No tracheal deviation present. No thyromegaly present.  Cardiovascular: Normal rate, regular rhythm, normal heart sounds and intact distal pulses.  Exam reveals no gallop and no friction rub.   No murmur heard. Pulmonary/Chest: Effort normal and breath sounds normal. No stridor. No respiratory distress. Brooke Wood has no wheezes. Brooke Wood has no rales. Brooke Wood exhibits no tenderness.  Abdominal: Soft. Bowel sounds are normal. Brooke Wood exhibits no distension and no mass. There is no tenderness. There is no rebound and no guarding.  Musculoskeletal: Normal range of motion. Brooke Wood exhibits no edema  and no tenderness.  Lymphadenopathy:    Brooke Wood has no cervical adenopathy.  Neurological: Brooke Wood is alert and oriented to person, place, and time. Brooke Wood has normal reflexes. Brooke Wood displays normal  reflexes. No cranial nerve deficit. Brooke Wood exhibits normal muscle tone. Coordination normal.  Skin: Skin is warm and dry. No rash noted. Brooke Wood is not diaphoretic. No erythema. No pallor.  Psychiatric: Brooke Wood has a normal mood and affect. Her behavior is normal. Judgment and thought content normal.          Assessment & Plan:

## 2010-11-19 NOTE — Assessment & Plan Note (Signed)
Her BP is well controlled 

## 2010-11-19 NOTE — Assessment & Plan Note (Signed)
She is not willing to use a steroid nasal spray so she will continue with allegra and singulair

## 2011-01-11 LAB — I-STAT 8, (EC8 V) (CONVERTED LAB)
BUN: 8
Chloride: 103
Glucose, Bld: 95
Operator id: 247071
Potassium: 3.6
Sodium: 135
pCO2, Ven: 38.3 — ABNORMAL LOW
pH, Ven: 7.448 — ABNORMAL HIGH

## 2011-01-11 LAB — POCT I-STAT CREATININE: Operator id: 247071

## 2011-02-26 ENCOUNTER — Other Ambulatory Visit (HOSPITAL_COMMUNITY): Payer: Self-pay | Admitting: Obstetrics and Gynecology

## 2011-02-26 DIAGNOSIS — Z1231 Encounter for screening mammogram for malignant neoplasm of breast: Secondary | ICD-10-CM

## 2011-03-11 ENCOUNTER — Encounter: Payer: Self-pay | Admitting: Internal Medicine

## 2011-03-11 ENCOUNTER — Ambulatory Visit (INDEPENDENT_AMBULATORY_CARE_PROVIDER_SITE_OTHER)
Admission: RE | Admit: 2011-03-11 | Discharge: 2011-03-11 | Disposition: A | Discharge status: Home / Self Care | Payer: Self-pay | Source: Ambulatory Visit | Attending: Internal Medicine | Admitting: Internal Medicine

## 2011-03-11 ENCOUNTER — Ambulatory Visit (INDEPENDENT_AMBULATORY_CARE_PROVIDER_SITE_OTHER): Payer: Self-pay | Admitting: Internal Medicine

## 2011-03-11 VITALS — BP 130/90 | HR 90 | Temp 98.6°F | Resp 16

## 2011-03-11 DIAGNOSIS — J209 Acute bronchitis, unspecified: Secondary | ICD-10-CM | POA: Insufficient documentation

## 2011-03-11 DIAGNOSIS — E049 Nontoxic goiter, unspecified: Secondary | ICD-10-CM

## 2011-03-11 DIAGNOSIS — R059 Cough, unspecified: Secondary | ICD-10-CM | POA: Insufficient documentation

## 2011-03-11 DIAGNOSIS — R05 Cough: Secondary | ICD-10-CM

## 2011-03-11 DIAGNOSIS — I10 Essential (primary) hypertension: Secondary | ICD-10-CM

## 2011-03-11 MED ORDER — PROMETHAZINE-DM 6.25-15 MG/5ML PO SYRP: 5.0000 mL | ORAL_SOLUTION | Freq: Four times a day (QID) | ORAL | Status: AC | PRN

## 2011-03-11 MED ORDER — AZITHROMYCIN 500 MG PO TABS: 500.0000 mg | ORAL_TABLET | Freq: Every day | ORAL | Status: AC

## 2011-03-11 NOTE — Assessment & Plan Note (Signed)
I will check a CXR to look for PNA 

## 2011-03-11 NOTE — Assessment & Plan Note (Signed)
The gland feels normal today, I will check her TSH

## 2011-03-11 NOTE — Progress Notes (Signed)
Subjective:    Patient ID: ADRIYANNA CHRISTIANS, female    DOB: [birth year], 45 y.o.   MRN: [id number]  Cough This is a new problem. Episode onset: for 3 days. The problem has been gradually worsening. The problem occurs every few hours. The cough is productive of purulent sputum. Associated symptoms include chills, nasal congestion, postnasal drip and rhinorrhea. Pertinent negatives include no chest pain, ear congestion, ear pain, fever, headaches, heartburn, hemoptysis, myalgias, rash, sore throat, shortness of breath, sweats, weight loss or wheezing. She has tried nothing for the symptoms.  Hypertension This is a chronic problem. The current episode started more than 1 year ago. The problem has been gradually improving since onset. The problem is controlled. Pertinent negatives include no anxiety, blurred vision, chest pain, headaches, malaise/fatigue, neck pain, orthopnea, palpitations, peripheral edema, PND, shortness of breath or sweats. There are no associated agents to hypertension. Past treatments include angiotensin blockers and diuretics. The current treatment provides moderate improvement. Compliance problems include exercise and diet.       Review of Systems  Constitutional: Positive for chills. Negative for fever, weight loss, malaise/fatigue, diaphoresis, activity change, appetite change, fatigue and unexpected weight change.  HENT: Positive for congestion, rhinorrhea, sneezing, postnasal drip and sinus pressure. Negative for hearing loss, ear pain, nosebleeds, sore throat, facial swelling, trouble swallowing, neck pain, neck stiffness, voice change, tinnitus and ear discharge.   Eyes: Negative for blurred vision.  Respiratory: Positive for cough. Negative for hemoptysis, chest tightness, shortness of breath, wheezing and stridor.   Cardiovascular: Negative for chest pain, palpitations, orthopnea, leg swelling and PND.  Gastrointestinal: Negative for heartburn, nausea, vomiting, abdominal  pain, diarrhea, constipation, blood in stool and abdominal distention.  Genitourinary: Negative for dysuria, urgency, frequency, hematuria, flank pain, decreased urine volume, enuresis, difficulty urinating and dyspareunia.  Musculoskeletal: Negative for myalgias, back pain, joint swelling, arthralgias and gait problem.  Skin: Negative for color change, pallor, rash and wound.  Neurological: Negative for dizziness, tremors, seizures, syncope, facial asymmetry, speech difficulty, weakness, light-headedness, numbness and headaches.  Hematological: Negative for adenopathy. Does not bruise/bleed easily.  Psychiatric/Behavioral: Negative.        Objective:   Physical Exam  Vitals reviewed. Constitutional: She is oriented to person, place, and time. She appears well-developed and well-nourished. No distress.  HENT:  Head: Normocephalic and atraumatic. No trismus in the jaw.  Right Ear: Hearing, tympanic membrane, external ear and ear canal normal.  Left Ear: Hearing, tympanic membrane, external ear and ear canal normal.  Nose: Mucosal edema and rhinorrhea present. No nose lacerations, sinus tenderness, nasal deformity, septal deviation or nasal septal hematoma. No epistaxis.  No foreign bodies. Right sinus exhibits no maxillary sinus tenderness and no frontal sinus tenderness. Left sinus exhibits no maxillary sinus tenderness and no frontal sinus tenderness.  Mouth/Throat: Oropharynx is clear and moist and mucous membranes are normal. Mucous membranes are not pale, not dry and not cyanotic. No uvula swelling. No oropharyngeal exudate, posterior oropharyngeal edema, posterior oropharyngeal erythema or tonsillar abscesses.  Eyes: Conjunctivae are normal. Right eye exhibits no discharge. Left eye exhibits no discharge. No scleral icterus.  Neck: Normal range of motion. Neck supple. No JVD present. No tracheal deviation present. No thyromegaly present.  Cardiovascular: Normal rate, regular rhythm, normal  heart sounds and intact distal pulses.  Exam reveals no gallop and no friction rub.   No murmur heard. Pulmonary/Chest: Effort normal and breath sounds normal. No stridor. No respiratory distress. She has no wheezes. She has no rales. She  exhibits no tenderness.  Abdominal: Soft. Bowel sounds are normal. She exhibits no distension and no mass. There is no tenderness. There is no rebound and no guarding.  Musculoskeletal: Normal range of motion. She exhibits no edema and no tenderness.  Lymphadenopathy:    She has no cervical adenopathy.  Neurological: She is oriented to person, place, and time.  Skin: Skin is warm and dry. No rash noted. She is not diaphoretic. No erythema. No pallor.  Psychiatric: She has a normal mood and affect. Her behavior is normal. Judgment and thought content normal.      Lab Results  Component Value Date   WBC 7.2 07/19/2010   HGB 13.4 07/19/2010   HCT 39.3 07/19/2010   PLT 256.0 07/19/2010   GLUCOSE 89 07/19/2010   NA 138 07/19/2010   K 4.3 07/19/2010   CL 105 07/19/2010   CREATININE 0.6 07/19/2010   BUN 10 07/19/2010   CO2 27 07/19/2010   TSH 1.53 07/19/2010      Assessment & Plan:

## 2011-03-11 NOTE — Patient Instructions (Addendum)
Acute Bronchitis You have acute bronchitis. This means you have a chest cold. The airways in your lungs are red and sore (inflamed). Acute means it is sudden onset.  CAUSES Bronchitis is most often caused by the same virus that causes a cold. SYMPTOMS   Body aches.   Chest congestion.   Chills.   Cough.   Fever.   Shortness of breath.   Sore throat.  TREATMENT  Acute bronchitis is usually treated with rest, fluids, and medicines for relief of fever or cough. Most symptoms should go away after a few days or a week. Increased fluids may help thin your secretions and will prevent dehydration. Your caregiver may give you an inhaler to improve your symptoms. The inhaler reduces shortness of breath and helps control cough. You can take over-the-counter pain relievers or cough medicine to decrease coughing, pain, or fever. A cool-air vaporizer may help thin bronchial secretions and make it easier to clear your chest. Antibiotics are usually not needed but can be prescribed if you smoke, are seriously ill, have chronic lung problems, are elderly, or you are at higher risk for developing complications.Allergies and asthma can make bronchitis worse. Repeated episodes of bronchitis may cause longstanding lung problems. Avoid smoking and secondhand smoke.Exposure to cigarette smoke or irritating chemicals will make bronchitis worse. If you are a cigarette smoker, consider using nicotine gum or skin patches to help control withdrawal symptoms. Quitting smoking will help your lungs heal faster. Recovery from bronchitis is often slow, but you should start feeling better after 2 to 3 days. Cough from bronchitis frequently lasts for 3 to 4 weeks. To prevent another bout of acute bronchitis:  Quit smoking.   Wash your hands frequently to get rid of viruses or use a hand sanitizer.   Avoid other people with cold or virus symptoms.   Try not to touch your hands to your mouth, nose, or eyes.  SEEK  IMMEDIATE MEDICAL CARE IF:  You develop increased fever, chills, or chest pain.   You have severe shortness of breath or bloody sputum.   You develop dehydration, fainting, repeated vomiting, or a severe headache.   You have no improvement after 1 week of treatment or you get worse.  MAKE SURE YOU:   Understand these instructions.   Will watch your condition.   Will get help right away if you are not doing well or get worse.  Document Released: 04/25/2004 Document Revised: 11/28/2010 Document Reviewed: 07/11/2010 [facility] Patient Information 2012 [place], [place].Hypertension As your heart beats, it forces blood through your arteries. This force is your blood pressure. If the pressure is too high, it is called hypertension (HTN) or high blood pressure. HTN is dangerous because you may have it and not know it. High blood pressure may mean that your heart has to work harder to pump blood. Your arteries may be narrow or stiff. The extra work puts you at risk for heart disease, stroke, and other problems.  Blood pressure consists of two numbers, a higher number over a lower, 110/72, for example. It is stated as "110 over 72." The ideal is below 120 for the top number (systolic) and under 80 for the bottom (diastolic). Write down your blood pressure today. You should pay close attention to your blood pressure if you have certain conditions such as:  Heart failure.   Prior heart attack.   Diabetes   Chronic kidney disease.   Prior stroke.   Multiple risk factors for heart disease.  To  see if you have HTN, your blood pressure should be measured while you are seated with your arm held at the level of the heart. It should be measured at least twice. A one-time elevated blood pressure reading (especially in the Emergency Department) does not mean that you need treatment. There may be conditions in which the blood pressure is different between your right and left arms. It is important to see  your caregiver soon for a recheck. Most people have essential hypertension which means that there is not a specific cause. This type of high blood pressure may be lowered by changing lifestyle factors such as:  Stress.   Smoking.   Lack of exercise.   Excessive weight.   Drug/tobacco/alcohol use.   Eating less salt.  Most people do not have symptoms from high blood pressure until it has caused damage to the body. Effective treatment can often prevent, delay or reduce that damage. TREATMENT  When a cause has been identified, treatment for high blood pressure is directed at the cause. There are a large number of medications to treat HTN. These fall into several categories, and your caregiver will help you select the medicines that are best for you. Medications may have side effects. You should review side effects with your caregiver. If your blood pressure stays high after you have made lifestyle changes or started on medicines,   Your medication(s) may need to be changed.   Other problems may need to be addressed.   Be certain you understand your prescriptions, and know how and when to take your medicine.   Be sure to follow up with your caregiver within the time frame advised (usually within two weeks) to have your blood pressure rechecked and to review your medications.   If you are taking more than one medicine to lower your blood pressure, make sure you know how and at what times they should be taken. Taking two medicines at the same time can result in blood pressure that is too low.  SEEK IMMEDIATE MEDICAL CARE IF:  You develop a severe headache, blurred or changing vision, or confusion.   You have unusual weakness or numbness, or a faint feeling.   You have severe chest or abdominal pain, vomiting, or breathing problems.  MAKE SURE YOU:   Understand these instructions.   Will watch your condition.   Will get help right away if you are not doing well or get worse.    Document Released: 03/18/2005 Document Revised: 11/28/2010 Document Reviewed: 11/06/2007 [facility] Patient Information 2012 [place], [place].

## 2011-03-11 NOTE — Assessment & Plan Note (Signed)
Her BP is adequately controlled, I will check her lytes and renal function today

## 2011-03-11 NOTE — Assessment & Plan Note (Signed)
Start zpak for the infection and phenergan-dm for the cough

## 2011-03-20 ENCOUNTER — Telehealth: Payer: Self-pay

## 2011-03-20 NOTE — Telephone Encounter (Signed)
Patient called stating that she has a continued cough and feels that a stronger cough med is needed. Thanks

## 2011-03-21 ENCOUNTER — Ambulatory Visit: Payer: Self-pay | Admitting: Internal Medicine

## 2011-04-01 ENCOUNTER — Ambulatory Visit (HOSPITAL_COMMUNITY): Payer: Self-pay

## 2011-04-10 ENCOUNTER — Ambulatory Visit (HOSPITAL_COMMUNITY)
Admission: RE | Admit: 2011-04-10 | Discharge: 2011-04-10 | Disposition: A | Discharge status: Home / Self Care | Payer: No Typology Code available for payment source | Source: Ambulatory Visit | Attending: Obstetrics and Gynecology | Admitting: Obstetrics and Gynecology

## 2011-04-10 DIAGNOSIS — Z1231 Encounter for screening mammogram for malignant neoplasm of breast: Secondary | ICD-10-CM

## 2011-04-26 ENCOUNTER — Ambulatory Visit (HOSPITAL_COMMUNITY): Payer: Self-pay

## 2011-05-16 ENCOUNTER — Ambulatory Visit (INDEPENDENT_AMBULATORY_CARE_PROVIDER_SITE_OTHER): Payer: Self-pay | Admitting: Internal Medicine

## 2011-05-16 ENCOUNTER — Encounter: Payer: Self-pay | Admitting: Internal Medicine

## 2011-05-16 DIAGNOSIS — I10 Essential (primary) hypertension: Secondary | ICD-10-CM

## 2011-05-16 DIAGNOSIS — E049 Nontoxic goiter, unspecified: Secondary | ICD-10-CM

## 2011-05-16 NOTE — Assessment & Plan Note (Signed)
Her BP is well controlled, I will check her lytes and renal function today 

## 2011-05-16 NOTE — Assessment & Plan Note (Signed)
Check a f/up u/s

## 2011-05-16 NOTE — Patient Instructions (Signed)

## 2011-05-16 NOTE — Progress Notes (Signed)
  Subjective:    Patient ID: Brooke Wood, female    DOB: [birth year], 46 y.o.   MRN: [id number]  Hypertension This is a chronic problem. The current episode started more than 1 year ago. The problem has been gradually improving since onset. The problem is controlled. Associated symptoms include anxiety. Pertinent negatives include no blurred vision, chest pain, headaches, malaise/fatigue, neck pain, orthopnea, palpitations, peripheral edema, PND, shortness of breath or sweats. Agents associated with hypertension include oral contraceptives. Past treatments include angiotensin blockers and diuretics. The current treatment provides significant improvement. There are no compliance problems.       Review of Systems  Constitutional: Negative.  Negative for malaise/fatigue.  HENT: Negative.  Negative for neck pain.   Eyes: Negative.  Negative for blurred vision.  Respiratory: Negative.  Negative for shortness of breath.   Cardiovascular: Negative.  Negative for chest pain, palpitations, orthopnea and PND.  Gastrointestinal: Negative.   Genitourinary: Negative.   Musculoskeletal: Negative.   Skin: Negative.   Neurological: Negative.  Negative for headaches.  Hematological: Negative.   Psychiatric/Behavioral: Negative.        Objective:   Physical Exam  Vitals reviewed. Constitutional: She is oriented to person, place, and time. She appears well-developed and well-nourished. No distress.  HENT:  Head: Normocephalic and atraumatic.  Mouth/Throat: Oropharynx is clear and moist. No oropharyngeal exudate.  Eyes: Conjunctivae are normal. Right eye exhibits no discharge. Left eye exhibits no discharge. No scleral icterus.  Neck: Normal range of motion. Neck supple. No JVD present. No tracheal deviation present. No thyromegaly present.  Cardiovascular: Normal rate, regular rhythm, normal heart sounds and intact distal pulses.  Exam reveals no gallop and no friction rub.   No murmur  heard. Pulmonary/Chest: Effort normal and breath sounds normal. No stridor. No respiratory distress. She has no wheezes. She has no rales. She exhibits no tenderness.  Abdominal: Soft. Bowel sounds are normal. She exhibits no distension and no mass. There is no tenderness. There is no rebound and no guarding.  Musculoskeletal: Normal range of motion. She exhibits no edema and no tenderness.  Lymphadenopathy:    She has no cervical adenopathy.  Neurological: She is oriented to person, place, and time.  Skin: Skin is warm and dry. No rash noted. She is not diaphoretic. No erythema. No pallor.  Psychiatric: She has a normal mood and affect. Her behavior is normal. Judgment and thought content normal.      Lab Results  Component Value Date   WBC 7.2 07/19/2010   HGB 13.4 07/19/2010   HCT 39.3 07/19/2010   PLT 256.0 07/19/2010   GLUCOSE 89 07/19/2010   NA 138 07/19/2010   K 4.3 07/19/2010   CL 105 07/19/2010   CREATININE 0.6 07/19/2010   BUN 10 07/19/2010   CO2 27 07/19/2010   TSH 1.53 07/19/2010      Assessment & Plan:

## 2011-05-23 ENCOUNTER — Other Ambulatory Visit (HOSPITAL_COMMUNITY): Payer: Self-pay

## 2011-05-31 ENCOUNTER — Ambulatory Visit (HOSPITAL_COMMUNITY)
Admission: RE | Admit: 2011-05-31 | Discharge: 2011-05-31 | Disposition: A | Discharge status: Home / Self Care | Payer: Self-pay | Source: Ambulatory Visit | Attending: Internal Medicine | Admitting: Internal Medicine

## 2011-05-31 DIAGNOSIS — E049 Nontoxic goiter, unspecified: Secondary | ICD-10-CM | POA: Insufficient documentation

## 2011-08-23 ENCOUNTER — Telehealth: Payer: Self-pay | Admitting: *Deleted

## 2011-08-23 ENCOUNTER — Ambulatory Visit (INDEPENDENT_AMBULATORY_CARE_PROVIDER_SITE_OTHER): Payer: Self-pay | Admitting: Critical Care Medicine

## 2011-08-23 ENCOUNTER — Encounter: Payer: Self-pay | Admitting: Critical Care Medicine

## 2011-08-23 VITALS — BP 154/90 | HR 89 | Temp 98.3°F | Ht 65.0 in | Wt 157.6 lb

## 2011-08-23 DIAGNOSIS — J383 Other diseases of vocal cords: Secondary | ICD-10-CM

## 2011-08-23 NOTE — Telephone Encounter (Signed)
OV note from today faxed to Dr. Delia Heady at [facility] at [phone]

## 2011-08-23 NOTE — Telephone Encounter (Signed)
Message copied by Valentino Hue on Fri Aug 23, 2011  3:59 PM ------      Message from: Shan Levans E      Created: Fri Aug 23, 2011  2:50 PM       pls fax todays note to the ENT at [facility]

## 2011-08-23 NOTE — Patient Instructions (Signed)
No medication changes A referral to ENT at [facility] will be made Return to pulmonary as needed

## 2011-08-23 NOTE — Progress Notes (Signed)
Subjective:    Patient ID: Brooke Wood, female    DOB: October 30, 1965, 46 y.o.   MRN: [id number]  HPI CC: Acute Visit. Last seen 06/2008. Pt c/o voice going and coming x 1 wk. states she will have a little SOB with the episodes. .  History of Present Illness:  Patient is a 46year-old African-American female patient of Dr. Lynelle Doctor who has a known history of vocal cord dysfunction syndrome and allergic rhinitis. No true asthma. Constantly seeking disability from [facility]  July 24, 2009 9:24 AM  Not seen since 4/10  Hx VCD and allergic rhinitis and no true asthma. Constantly seeking disability.  Was ok until last week. No real nasal drip. No real cough. When spells hit gets very dyspneic and cannot catch breath. Also hoarse. No using voice alot.  Pt is unemployed. Perfume is an issue.   08/23/2011 Not seen since 2011.  Still dyspneic with odor exposure.  Any strong fume. Pt will shut down and not able to speak. Throat will close off.  In between is not dyspneic.  Pt will stay hoarse.   Pt denies any significant sore throat, nasal congestion or excess secretions, fever, chills, sweats, unintended weight loss, pleurtic or exertional chest pain, orthopnea PND, or leg swelling Pt denies any increase in rescue therapy over baseline, denies waking up needing it or having any early am or nocturnal exacerbations of coughing/wheezing/or dyspnea. Pt also denies any obvious fluctuation in symptoms with  weather or environmental change or other alleviating or aggravating factors     Past Medical History  Diagnosis Date  . Allergy     Neg RAST 2007  . Hypertension   . Vocal cord dysfunction     Neg Methacholine challenge test 2007     Family History  Problem Relation Age of Onset  . Hypertension Other      History   Social History  . Marital Status: Single    Spouse Name: N/A    Number of Children: N/A  . Years of Education: N/A   Occupational History  .      [employer]   Social  History Main Topics  . Smoking status: Never Smoker   . Smokeless tobacco: Never Used  . Alcohol Use: 2.4 oz/week    4 Glasses of wine per week  . Drug Use: No  . Sexually Active: Yes    Birth Control/ Protection: Pill   Other Topics Concern  . Not on file   Social History Narrative   Regular exercise-No     Allergies  Allergen Reactions  . Aspirin   . Codeine   . Codeine Phosphate     REACTION: unspecified     Outpatient Prescriptions Prior to Visit  Medication Sig Dispense Refill  . Norgestim-Eth Estrad Triphasic (ORTHO TRI-CYCLEN, 28, PO) Take 1 tablet by mouth daily.        . fexofenadine (ALLEGRA) 180 MG tablet Take 1 tablet (180 mg total) by mouth daily as needed.  30 tablet  11  . montelukast (SINGULAIR) 10 MG tablet Take 1 tablet (10 mg total) by mouth at bedtime.  30 tablet  11  . valsartan-hydrochlorothiazide (DIOVAN-HCT) 160-25 MG per tablet Take 1 tablet by mouth daily.            Review of Systems Constitutional:   No  weight loss, night sweats,  Fevers, chills, fatigue, lassitude. HEENT:   No headaches,  Difficulty swallowing,  Tooth/dental problems,  Sore throat,  No sneezing, itching, ear ache, nasal congestion, post nasal drip,   CV:  No chest pain,  Orthopnea, PND, swelling in lower extremities, anasarca, dizziness, palpitations  GI  No heartburn, indigestion, abdominal pain, nausea, vomiting, diarrhea, change in bowel habits, loss of appetite  Resp: No shortness of breath with exertion or at rest.  No excess mucus, no productive cough,  No non-productive cough,  No coughing up of blood.  No change in color of mucus.  No wheezing.  No chest wall deformity  Skin: no rash or lesions.  GU: no dysuria, change in color of urine, no urgency or frequency.  No flank pain.  MS:  No joint pain or swelling.  No decreased range of motion.  No back pain.  Psych:  No change in mood or affect. No depression or anxiety.  No memory loss.       Objective:   Physical Exam Filed Vitals:   08/23/11 1056  BP: 154/90  Pulse: 89  Temp: 98.3 F (36.8 C)  TempSrc: Oral  Height: 5\' 5"  (1.651 m)  Weight: 157 lb 9.6 oz (71.487 kg)  SpO2: 98%    Gen: Pleasant, well-nourished, in no distress,  normal affect  ENT: No lesions,  mouth clear,  oropharynx clear, no postnasal drip  Neck: No JVD, no TMG, no carotid bruits  Lungs: No use of accessory muscles, no dullness to percussion, clear without rales or rhonchi  Cardiovascular: RRR, heart sounds normal, no murmur or gallops, no peripheral edema  Abdomen: soft and NT, no HSM,  BS normal  Musculoskeletal: No deformities, no cyanosis or clubbing  Neuro: alert, non focal  Skin: Warm, no lesions or rashes   Cleda Daub 08/23/2011 Normal except for expiratory flow volume loop abn c/w VCD       Assessment & Plan:   VOCAL CORD DISORDER Severe paroxysm Vocal cord dysfunction ppt by fumes/odor exposure. No true asthma  No pulmonary options Plan  Refer to voice center at [facility], Dr Lynelle Doctor group for VCD eval and Rx Return to pulm prn    Updated Medication List Outpatient Encounter Prescriptions as of 08/23/2011  Medication Sig Dispense Refill  . Norgestim-Eth Estrad Triphasic (ORTHO TRI-CYCLEN, 28, PO) Take 1 tablet by mouth daily.        . valsartan-hydrochlorothiazide (DIOVAN-HCT) 160-25 MG per tablet Take 1 tablet by mouth daily.        Marland Kitchen DISCONTD: fexofenadine (ALLEGRA) 180 MG tablet Take 1 tablet (180 mg total) by mouth daily as needed.  30 tablet  11  . DISCONTD: montelukast (SINGULAIR) 10 MG tablet Take 1 tablet (10 mg total) by mouth at bedtime.  30 tablet  11

## 2011-08-23 NOTE — Assessment & Plan Note (Signed)
Severe paroxysm Vocal cord dysfunction ppt by fumes/odor exposure. No true asthma  No pulmonary options Plan  Refer to voice center at [facility], Dr Lynelle Doctor group for VCD eval and Rx Return to pulm prn

## 2011-08-27 ENCOUNTER — Ambulatory Visit: Payer: Self-pay | Admitting: Critical Care Medicine

## 2011-09-25 ENCOUNTER — Other Ambulatory Visit: Payer: Self-pay

## 2011-09-25 MED ORDER — VALSARTAN-HYDROCHLOROTHIAZIDE 160-25 MG PO TABS: 1.0000 | ORAL_TABLET | Freq: Every day | ORAL | Status: DC

## 2011-11-15 ENCOUNTER — Ambulatory Visit: Payer: Self-pay | Admitting: Internal Medicine

## 2011-11-18 ENCOUNTER — Ambulatory Visit (INDEPENDENT_AMBULATORY_CARE_PROVIDER_SITE_OTHER): Payer: Self-pay | Admitting: Internal Medicine

## 2011-11-18 ENCOUNTER — Encounter: Payer: Self-pay | Admitting: Internal Medicine

## 2011-11-18 VITALS — BP 140/98 | HR 75 | Temp 98.4°F | Resp 16 | Wt 158.0 lb

## 2011-11-18 DIAGNOSIS — E049 Nontoxic goiter, unspecified: Secondary | ICD-10-CM

## 2011-11-18 DIAGNOSIS — I10 Essential (primary) hypertension: Secondary | ICD-10-CM

## 2011-11-18 MED ORDER — OLMESARTAN MEDOXOMIL-HCTZ 40-12.5 MG PO TABS: 1.0000 | ORAL_TABLET | Freq: Every day | ORAL | Status: DC

## 2011-11-18 NOTE — Assessment & Plan Note (Signed)
Her BP is not well controlled, she has not been able to afford, I gave her Benicar-HCT samples, I will check her labs today

## 2011-11-18 NOTE — Patient Instructions (Signed)

## 2011-11-18 NOTE — Assessment & Plan Note (Signed)
Her thyroid gland feels normal today, I will check her TSH level

## 2011-11-18 NOTE — Progress Notes (Signed)
  Subjective:    Patient ID: Brooke Wood, female    DOB: [birth year], 46 y.o.   MRN: [id number]  Hypertension This is a chronic problem. The current episode started more than 1 year ago. The problem has been gradually worsening since onset. The problem is uncontrolled. Pertinent negatives include no anxiety, blurred vision, chest pain, headaches, malaise/fatigue, neck pain, orthopnea, palpitations, peripheral edema, PND, shortness of breath or sweats. Past treatments include nothing. Compliance problems include medication cost.       Review of Systems  Constitutional: Negative.  Negative for malaise/fatigue.  HENT: Negative.  Negative for neck pain.   Eyes: Negative.  Negative for blurred vision.  Respiratory: Negative.  Negative for shortness of breath.   Cardiovascular: Negative.  Negative for chest pain, palpitations, orthopnea and PND.  Gastrointestinal: Negative.   Genitourinary: Negative.   Musculoskeletal: Negative.   Skin: Negative.   Neurological: Negative.  Negative for headaches.  Hematological: Negative.   Psychiatric/Behavioral: Negative.        Objective:   Physical Exam  Vitals reviewed. Constitutional: She is oriented to person, place, and time. She appears well-developed and well-nourished. No distress.  HENT:  Head: Normocephalic and atraumatic.  Mouth/Throat: Oropharynx is clear and moist. No oropharyngeal exudate.  Eyes: Conjunctivae are normal. Right eye exhibits no discharge. Left eye exhibits no discharge. No scleral icterus.  Neck: Normal range of motion. Neck supple. No JVD present. No tracheal deviation present. No thyromegaly present.  Cardiovascular: Normal rate, regular rhythm, normal heart sounds and intact distal pulses.  Exam reveals no gallop and no friction rub.   No murmur heard. Pulmonary/Chest: Effort normal and breath sounds normal. No stridor. No respiratory distress. She has no wheezes. She has no rales. She exhibits no tenderness.    Abdominal: Soft. Bowel sounds are normal. She exhibits no distension and no mass. There is no tenderness. There is no rebound and no guarding.  Musculoskeletal: Normal range of motion. She exhibits no edema and no tenderness.  Lymphadenopathy:    She has no cervical adenopathy.  Neurological: She is oriented to person, place, and time.  Skin: Skin is warm and dry. No rash noted. She is not diaphoretic. No erythema. No pallor.  Psychiatric: She has a normal mood and affect. Her behavior is normal. Judgment and thought content normal.      Lab Results  Component Value Date   WBC 7.2 07/19/2010   HGB 13.4 07/19/2010   HCT 39.3 07/19/2010   PLT 256.0 07/19/2010   GLUCOSE 89 07/19/2010   NA 138 07/19/2010   K 4.3 07/19/2010   CL 105 07/19/2010   CREATININE 0.6 07/19/2010   BUN 10 07/19/2010   CO2 27 07/19/2010   TSH 1.53 07/19/2010      Assessment & Plan:

## 2011-11-21 ENCOUNTER — Telehealth: Payer: Self-pay

## 2011-11-21 NOTE — Telephone Encounter (Signed)
Called pt again no answer LMOM RTC... 11/21/11@3 :40pm/LMB

## 2011-11-21 NOTE — Telephone Encounter (Signed)
Patient called LMOVM c/o dizziness since starting new BP medication.

## 2011-11-22 NOTE — Telephone Encounter (Signed)
LMOVM for pt to call back 

## 2011-11-25 NOTE — Telephone Encounter (Signed)
Called patient, no answer// closing phone note until pt calls back

## 2011-11-27 ENCOUNTER — Encounter: Payer: Self-pay | Admitting: Internal Medicine

## 2011-11-27 ENCOUNTER — Ambulatory Visit (INDEPENDENT_AMBULATORY_CARE_PROVIDER_SITE_OTHER): Payer: Self-pay | Admitting: Internal Medicine

## 2011-11-27 ENCOUNTER — Ambulatory Visit: Payer: Self-pay | Admitting: Internal Medicine

## 2011-11-27 VITALS — BP 150/96 | HR 84 | Temp 97.8°F | Resp 16 | Wt 159.2 lb

## 2011-11-27 DIAGNOSIS — I10 Essential (primary) hypertension: Secondary | ICD-10-CM

## 2011-11-27 MED ORDER — VALSARTAN-HYDROCHLOROTHIAZIDE 160-25 MG PO TABS: 1.0000 | ORAL_TABLET | Freq: Every day | ORAL | Status: DC

## 2011-11-27 NOTE — Assessment & Plan Note (Addendum)
She has not taken benicar-hct for one week b/c "she does not like it." She wants to go back to diovan-hct. She will do her labs today.

## 2011-11-27 NOTE — Patient Instructions (Signed)

## 2011-11-27 NOTE — Progress Notes (Signed)
  Subjective:    Patient ID: Brooke Wood, female    DOB: May 16, 1965, 46 y.o.   MRN: [id number]  Hypertension This is a chronic problem. The current episode started more than 1 year ago. The problem has been gradually worsening since onset. The problem is uncontrolled. Pertinent negatives include no anxiety, blurred vision, chest pain, headaches, malaise/fatigue, neck pain, orthopnea, palpitations, peripheral edema, PND, shortness of breath or sweats. Agents associated with hypertension include oral contraceptives. Past treatments include nothing. Compliance problems include psychosocial issues.       Review of Systems  Constitutional: Negative for fever, chills, malaise/fatigue, diaphoresis, activity change, appetite change, fatigue and unexpected weight change.  HENT: Negative.  Negative for neck pain.   Eyes: Negative.  Negative for blurred vision.  Respiratory: Negative for cough, chest tightness, shortness of breath, wheezing and stridor.   Cardiovascular: Negative for chest pain, palpitations, orthopnea, leg swelling and PND.  Gastrointestinal: Negative.   Genitourinary: Negative.   Musculoskeletal: Negative.   Skin: Negative.   Neurological: Negative.  Negative for headaches.  Hematological: Negative for adenopathy. Does not bruise/bleed easily.  Psychiatric/Behavioral: Negative.        Objective:   Physical Exam  Vitals reviewed. Constitutional: She is oriented to person, place, and time. She appears well-developed and well-nourished. No distress.  HENT:  Head: Normocephalic and atraumatic.  Mouth/Throat: Oropharynx is clear and moist. No oropharyngeal exudate.  Eyes: Conjunctivae are normal. Right eye exhibits no discharge. Left eye exhibits no discharge. No scleral icterus.  Neck: Normal range of motion. Neck supple. No JVD present. No tracheal deviation present. No thyromegaly present.  Cardiovascular: Normal rate, regular rhythm, normal heart sounds and intact distal  pulses.  Exam reveals no gallop and no friction rub.   No murmur heard. Pulmonary/Chest: Effort normal and breath sounds normal. No stridor. No respiratory distress. She has no wheezes. She has no rales. She exhibits no tenderness.  Abdominal: Soft. Bowel sounds are normal. She exhibits no distension and no mass. There is no tenderness. There is no rebound and no guarding.  Musculoskeletal: Normal range of motion. She exhibits no edema and no tenderness.  Lymphadenopathy:    She has no cervical adenopathy.  Neurological: She is oriented to person, place, and time.  Skin: Skin is warm and dry. No rash noted. She is not diaphoretic. No erythema. No pallor.  Psychiatric: She has a normal mood and affect. Her behavior is normal. Judgment and thought content normal.          Assessment & Plan:

## 2011-11-28 ENCOUNTER — Other Ambulatory Visit: Payer: Self-pay

## 2011-11-28 MED ORDER — LOSARTAN POTASSIUM-HCTZ 100-25 MG PO TABS: 1.0000 | ORAL_TABLET | Freq: Every day | ORAL | Status: DC

## 2011-11-28 NOTE — Telephone Encounter (Signed)
Received fax from pharmacy stating that Rx for valsartan/hctz 160-25 will cost $166.62. Patient has no insurance and would like a cheaper alternative. Please advise PCP out of the office

## 2011-11-28 NOTE — Telephone Encounter (Signed)
Ok to change to Hyzaar 100-25 one a day instead Thx

## 2011-11-28 NOTE — Telephone Encounter (Signed)
Pharmacy calling again stating Hyzaar 100-25 is approximately $200. Please advise them if there is anything cheaper that is ok for pt to take. Pharmacy aware that PCP and Dr. Asa Lente are out of office today and that Dr. Posey Rea will receive mess tomorrow 11/29/11.

## 2011-11-28 NOTE — Telephone Encounter (Signed)
Notified pt with changing of BP med. Pt states she wanting to go back on what she was taking before. She wasn't @ home she states she will call back in the morning with name of med...Raechel Chute

## 2011-11-29 NOTE — Telephone Encounter (Signed)
Ok Maxzide 1 a day F/u w/Dr [name] in 1 mo Thx

## 2011-12-03 ENCOUNTER — Encounter: Payer: Self-pay | Admitting: Internal Medicine

## 2011-12-03 ENCOUNTER — Ambulatory Visit (INDEPENDENT_AMBULATORY_CARE_PROVIDER_SITE_OTHER): Payer: Self-pay | Admitting: Internal Medicine

## 2011-12-03 VITALS — BP 120/80 | HR 80 | Temp 98.3°F | Resp 16 | Wt 152.0 lb

## 2011-12-03 DIAGNOSIS — J309 Allergic rhinitis, unspecified: Secondary | ICD-10-CM

## 2011-12-03 DIAGNOSIS — I10 Essential (primary) hypertension: Secondary | ICD-10-CM

## 2011-12-03 MED ORDER — CETIRIZINE HCL 10 MG PO TABS: 10.0000 mg | ORAL_TABLET | Freq: Every day | ORAL | Status: DC

## 2011-12-03 NOTE — Assessment & Plan Note (Signed)
Start zyrtec 

## 2011-12-03 NOTE — Assessment & Plan Note (Signed)
Her BP is well controlled, she agrees to do her lab work today

## 2011-12-03 NOTE — Progress Notes (Signed)
  Subjective:    Patient ID: Brooke Wood, female    DOB: 04-Mar-1966, 46 y.o.   MRN: [id number]  HPI  She returns c/o a 5 day history of sneezing, nasal congestion, and runny nose. She has not taken any meds to treat this.  Review of Systems  Constitutional: Negative for fever, chills, diaphoresis, activity change, appetite change, fatigue and unexpected weight change.  HENT: Positive for congestion, rhinorrhea and sneezing. Negative for ear pain, nosebleeds, sore throat, facial swelling, drooling, mouth sores, trouble swallowing, neck pain, dental problem, voice change, postnasal drip, sinus pressure, tinnitus and ear discharge.   Eyes: Negative.   Respiratory: Negative.   Cardiovascular: Negative.   Gastrointestinal: Negative.   Genitourinary: Negative.   Musculoskeletal: Negative.   Skin: Negative.   Neurological: Negative.   Hematological: Negative for adenopathy. Does not bruise/bleed easily.  Psychiatric/Behavioral: Negative.        Objective:   Physical Exam  Vitals reviewed. Constitutional: She is oriented to person, place, and time. Vital signs are normal. She appears well-developed and well-nourished.  Non-toxic appearance. She does not have a sickly appearance. She does not appear ill. No distress.  HENT:  Head: Normocephalic and atraumatic. No trismus in the jaw.  Right Ear: Hearing, tympanic membrane, external ear and ear canal normal.  Left Ear: Hearing, tympanic membrane, external ear and ear canal normal.  Nose: Mucosal edema and rhinorrhea present. No nose lacerations, sinus tenderness, septal deviation or nasal septal hematoma. No epistaxis.  No foreign bodies. Right sinus exhibits no maxillary sinus tenderness and no frontal sinus tenderness. Left sinus exhibits no maxillary sinus tenderness and no frontal sinus tenderness.  Mouth/Throat: Oropharynx is clear and moist and mucous membranes are normal. Mucous membranes are not pale, not dry and not cyanotic. No oral  lesions. No uvula swelling. No oropharyngeal exudate, posterior oropharyngeal edema, posterior oropharyngeal erythema or tonsillar abscesses.  Eyes: Conjunctivae are normal. Right eye exhibits no discharge. Left eye exhibits no discharge. No scleral icterus.  Neck: Normal range of motion. Neck supple. No JVD present. No tracheal deviation present. No thyromegaly present.  Cardiovascular: Normal rate, regular rhythm, normal heart sounds and intact distal pulses.  Exam reveals no gallop and no friction rub.   No murmur heard. Pulmonary/Chest: Effort normal and breath sounds normal. No stridor. No respiratory distress. She has no wheezes. She has no rales. She exhibits no tenderness.  Abdominal: Soft. Bowel sounds are normal. She exhibits no distension and no mass. There is no tenderness. There is no rebound and no guarding.  Musculoskeletal: Normal range of motion. She exhibits no edema and no tenderness.  Lymphadenopathy:    She has no cervical adenopathy.  Neurological: She is oriented to person, place, and time.  Skin: Skin is warm and dry. No rash noted. She is not diaphoretic. No erythema. No pallor.  Psychiatric: She has a normal mood and affect. Her behavior is normal. Judgment and thought content normal.          Assessment & Plan:

## 2011-12-03 NOTE — Patient Instructions (Signed)
Allergic Rhinitis  Allergic rhinitis is when the mucous membranes in the nose respond to allergens. Allergens are particles in the air that cause your body to have an allergic reaction. This causes you to release allergic antibodies. Through a chain of events, these eventually cause you to release histamine into the blood stream (hence the use of antihistamines). Although meant to be protective to the body, it is this release that causes your discomfort, such as frequent sneezing, congestion and an itchy runny nose.    CAUSES    The pollen allergens may come from grasses, trees, and weeds. This is seasonal allergic rhinitis, or "hay fever." Other allergens cause year-round allergic rhinitis (perennial allergic rhinitis) such as house dust mite allergen, pet dander and mold spores.    SYMPTOMS     Nasal stuffiness (congestion).   Runny, itchy nose with sneezing and tearing of the eyes.   There is often an itching of the mouth, eyes and ears.  It cannot be cured, but it can be controlled with medications.  DIAGNOSIS    If you are unable to determine the offending allergen, skin or blood testing may find it.  TREATMENT     Avoid the allergen.   Medications and allergy shots (immunotherapy) can help.   Hay fever may often be treated with antihistamines in pill or nasal spray forms. Antihistamines block the effects of histamine. There are over-the-counter medicines that may help with nasal congestion and swelling around the eyes. Check with your caregiver before taking or giving this medicine.  If the treatment above does not work, there are many new medications your caregiver can prescribe. Stronger medications may be used if initial measures are ineffective. Desensitizing injections can be used if medications and avoidance fails. Desensitization is when a patient is given ongoing shots until the body becomes less sensitive to the allergen. Make sure you follow up with your caregiver if problems continue.  SEEK  MEDICAL CARE IF:     You develop fever (more than 100.5 F (38.1 C).   You develop a cough that does not stop easily (persistent).   You have shortness of breath.   You start wheezing.   Symptoms interfere with normal daily activities.  Document Released: 12/11/2000 Document Revised: 03/07/2011 Document Reviewed: 06/22/2008  [employer] Patient Information 2012 [employer]

## 2011-12-06 ENCOUNTER — Telehealth: Payer: Self-pay | Admitting: Internal Medicine

## 2011-12-06 NOTE — Telephone Encounter (Signed)
Patient requests to speak with Alvy Beal, she has new information about her patient assistance

## 2012-01-07 ENCOUNTER — Emergency Department (HOSPITAL_COMMUNITY): Payer: No Typology Code available for payment source

## 2012-01-07 ENCOUNTER — Emergency Department (HOSPITAL_COMMUNITY)
Admission: EM | Admit: 2012-01-07 | Discharge: 2012-01-07 | Disposition: A | Discharge status: Home / Self Care | Payer: No Typology Code available for payment source | Attending: Emergency Medicine | Admitting: Emergency Medicine

## 2012-01-07 ENCOUNTER — Encounter (HOSPITAL_COMMUNITY): Payer: Self-pay | Admitting: *Deleted

## 2012-01-07 DIAGNOSIS — I10 Essential (primary) hypertension: Secondary | ICD-10-CM | POA: Insufficient documentation

## 2012-01-07 DIAGNOSIS — M79603 Pain in arm, unspecified: Secondary | ICD-10-CM

## 2012-01-07 DIAGNOSIS — Z886 Allergy status to analgesic agent status: Secondary | ICD-10-CM | POA: Insufficient documentation

## 2012-01-07 DIAGNOSIS — M79609 Pain in unspecified limb: Secondary | ICD-10-CM | POA: Insufficient documentation
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
## 2012-01-07 NOTE — ED Provider Notes (Signed)
Medical screening examination/treatment/procedure(s) were performed by non-physician practitioner and as supervising physician I was immediately available for consultation/collaboration.   Jamaurie Bernier Y. Zeric Baranowski, MD 01/07/12 2309 

## 2012-02-06 ENCOUNTER — Other Ambulatory Visit: Payer: Self-pay | Admitting: Obstetrics and Gynecology

## 2012-02-06 DIAGNOSIS — N63 Unspecified lump in unspecified breast: Secondary | ICD-10-CM

## 2012-02-13 ENCOUNTER — Other Ambulatory Visit: Payer: Self-pay

## 2012-02-18 ENCOUNTER — Encounter (HOSPITAL_COMMUNITY): Payer: Self-pay

## 2012-02-18 ENCOUNTER — Ambulatory Visit (HOSPITAL_COMMUNITY)
Admission: RE | Admit: 2012-02-18 | Discharge: 2012-02-18 | Disposition: A | Discharge status: Home / Self Care | Payer: No Typology Code available for payment source | Source: Ambulatory Visit | Attending: Obstetrics and Gynecology | Admitting: Obstetrics and Gynecology

## 2012-02-18 VITALS — BP 116/72 | Temp 98.1°F | Ht 66.0 in | Wt 156.4 lb

## 2012-02-18 DIAGNOSIS — N6311 Unspecified lump in the right breast, upper outer quadrant: Secondary | ICD-10-CM | POA: Insufficient documentation

## 2012-02-18 DIAGNOSIS — Z1239 Encounter for other screening for malignant neoplasm of breast: Secondary | ICD-10-CM

## 2012-02-18 NOTE — Progress Notes (Signed)
Complaints of right breast lump x 2 weeks.  Pap Smear:    Pap smear not performed today. Patients last Pap smear was 02/06/2012 and normal. Per patient she has a history of one abnormal Pap smear around 27 years ago that required cryotherapy for follow up. No Pap smear results in EPIC.  Physical exam: Breasts Breasts symmetrical. No skin abnormalities left breast. Scar on right upper breast from previous surgery to remove breast tissue per patient. No nipple retraction bilateral breasts. No nipple discharge bilateral breasts. No lymphadenopathy. No lumps palpated left breast. Palpated lump in the right breast around 11 o'clock 7 cm from the nipple. No complaints of pain or tenderness on exam. Patient referred to the [facility] for right breast diagnostic mammogram and possible ultrasound. Appointment scheduled for Friday, February 21, 2012 at 0800.     Pelvic/Bimanual No Pap smear completed today since last Pap smear was 02/06/2012 and normal. Pap smear not indicated per BCCCP guidelines.

## 2012-02-18 NOTE — Assessment & Plan Note (Signed)
Right breast lump at 11 o'clock 7 cm from the nipple. Patient referred to the [facility] for right breast diagnostic mammogram and possible ultrasound. Appointment scheduled for Friday, February 21, 2012 at 0800.

## 2012-02-18 NOTE — Patient Instructions (Signed)
Taught patient how to perform BSE. Patient did not need a Pap smear today due to last Pap smear was 02/06/2012 per patient. Told patient about free cervical cancer screenings to receive a Pap smears when next one is due.  Let her know BCCCP will cover Pap smears every 3 years unless has a history of abnormal Pap smears. Told patient since her abnormal Pap smear was 27 years ago that it is okay for next Pap smear to be in 3 years. Patient referred to the [facility] for right breast diagnostic mammogram and possible ultrasound. Appointment scheduled for Friday, February 21, 2012 at 0800. Patient aware of appointment and will be there. Patient verbalized understanding.

## 2012-02-21 ENCOUNTER — Other Ambulatory Visit: Payer: Self-pay | Admitting: Obstetrics and Gynecology

## 2012-02-21 ENCOUNTER — Ambulatory Visit
Admission: RE | Admit: 2012-02-21 | Discharge: 2012-02-21 | Disposition: A | Discharge status: Home / Self Care | Payer: No Typology Code available for payment source | Source: Ambulatory Visit | Attending: Obstetrics and Gynecology | Admitting: Obstetrics and Gynecology

## 2012-02-21 DIAGNOSIS — N63 Unspecified lump in unspecified breast: Secondary | ICD-10-CM

## 2012-03-04 ENCOUNTER — Ambulatory Visit
Admission: RE | Admit: 2012-03-04 | Discharge: 2012-03-04 | Disposition: A | Discharge status: Home / Self Care | Payer: No Typology Code available for payment source | Source: Ambulatory Visit | Attending: Obstetrics and Gynecology | Admitting: Obstetrics and Gynecology

## 2012-03-04 ENCOUNTER — Other Ambulatory Visit: Payer: Self-pay | Admitting: Obstetrics and Gynecology

## 2012-03-04 DIAGNOSIS — N63 Unspecified lump in unspecified breast: Secondary | ICD-10-CM

## 2012-03-05 ENCOUNTER — Ambulatory Visit
Admission: RE | Admit: 2012-03-05 | Discharge: 2012-03-05 | Disposition: A | Discharge status: Home / Self Care | Payer: No Typology Code available for payment source | Source: Ambulatory Visit | Attending: Obstetrics and Gynecology | Admitting: Obstetrics and Gynecology

## 2012-03-05 ENCOUNTER — Other Ambulatory Visit: Payer: Self-pay | Admitting: Obstetrics and Gynecology

## 2012-03-05 DIAGNOSIS — N63 Unspecified lump in unspecified breast: Secondary | ICD-10-CM

## 2012-03-05 DIAGNOSIS — C50911 Malignant neoplasm of unspecified site of right female breast: Secondary | ICD-10-CM

## 2012-03-06 ENCOUNTER — Telehealth: Payer: Self-pay | Admitting: *Deleted

## 2012-03-06 DIAGNOSIS — Z170 Estrogen receptor positive status [ER+]: Secondary | ICD-10-CM | POA: Insufficient documentation

## 2012-03-06 DIAGNOSIS — C50419 Malignant neoplasm of upper-outer quadrant of unspecified female breast: Secondary | ICD-10-CM

## 2012-03-06 DIAGNOSIS — C50411 Malignant neoplasm of upper-outer quadrant of right female breast: Secondary | ICD-10-CM | POA: Insufficient documentation

## 2012-03-06 NOTE — Telephone Encounter (Signed)
Confirmed [facility] for 03/11/12 at 1200 .  Instructions and contact information given.

## 2012-03-10 ENCOUNTER — Ambulatory Visit (HOSPITAL_COMMUNITY)
Admission: RE | Admit: 2012-03-10 | Discharge: 2012-03-10 | Disposition: A | Discharge status: Home / Self Care | Payer: Medicaid Other | Source: Ambulatory Visit | Attending: Obstetrics and Gynecology | Admitting: Obstetrics and Gynecology

## 2012-03-10 DIAGNOSIS — C50419 Malignant neoplasm of upper-outer quadrant of unspecified female breast: Secondary | ICD-10-CM | POA: Insufficient documentation

## 2012-03-10 DIAGNOSIS — C50911 Malignant neoplasm of unspecified site of right female breast: Secondary | ICD-10-CM

## 2012-03-10 LAB — CREATININE, SERUM: GFR calc non Af Amer: >90 mL/min (ref >90)

## 2012-03-10 MED ORDER — GADOBENATE DIMEGLUMINE 529 MG/ML IV SOLN
14.0000 mL | Freq: Once | INTRAVENOUS | Status: AC | PRN
  Administered 2012-03-10: 14 mL via INTRAVENOUS

## 2012-03-11 ENCOUNTER — Encounter (INDEPENDENT_AMBULATORY_CARE_PROVIDER_SITE_OTHER): Payer: Self-pay | Admitting: General Surgery

## 2012-03-11 ENCOUNTER — Ambulatory Visit: Payer: Self-pay

## 2012-03-11 ENCOUNTER — Encounter: Payer: Self-pay | Admitting: Radiation Oncology

## 2012-03-11 ENCOUNTER — Encounter: Payer: Self-pay | Admitting: *Deleted

## 2012-03-11 ENCOUNTER — Ambulatory Visit (HOSPITAL_BASED_OUTPATIENT_CLINIC_OR_DEPARTMENT_OTHER): Payer: Self-pay | Admitting: Oncology

## 2012-03-11 ENCOUNTER — Encounter: Payer: Self-pay | Admitting: Oncology

## 2012-03-11 ENCOUNTER — Ambulatory Visit
Admission: RE | Admit: 2012-03-11 | Discharge: 2012-03-11 | Disposition: A | Discharge status: Home / Self Care | Payer: Medicaid Other | Source: Ambulatory Visit | Attending: Radiation Oncology | Admitting: Radiation Oncology

## 2012-03-11 ENCOUNTER — Telehealth: Payer: Self-pay | Admitting: Oncology

## 2012-03-11 ENCOUNTER — Ambulatory Visit: Payer: Medicaid Other | Attending: General Surgery | Admitting: Physical Therapy

## 2012-03-11 ENCOUNTER — Ambulatory Visit (HOSPITAL_BASED_OUTPATIENT_CLINIC_OR_DEPARTMENT_OTHER): Payer: Medicaid Other | Admitting: General Surgery

## 2012-03-11 ENCOUNTER — Other Ambulatory Visit (HOSPITAL_BASED_OUTPATIENT_CLINIC_OR_DEPARTMENT_OTHER): Payer: Self-pay | Admitting: Lab

## 2012-03-11 VITALS — BP 152/106 | HR 87 | Temp 98.3°F | Resp 20 | Ht 65.3 in | Wt 156.0 lb

## 2012-03-11 DIAGNOSIS — C50419 Malignant neoplasm of upper-outer quadrant of unspecified female breast: Secondary | ICD-10-CM

## 2012-03-11 DIAGNOSIS — IMO0001 Reserved for inherently not codable concepts without codable children: Secondary | ICD-10-CM | POA: Insufficient documentation

## 2012-03-11 DIAGNOSIS — M25619 Stiffness of unspecified shoulder, not elsewhere classified: Secondary | ICD-10-CM | POA: Insufficient documentation

## 2012-03-11 DIAGNOSIS — C50919 Malignant neoplasm of unspecified site of unspecified female breast: Secondary | ICD-10-CM | POA: Insufficient documentation

## 2012-03-11 DIAGNOSIS — Z170 Estrogen receptor positive status [ER+]: Secondary | ICD-10-CM

## 2012-03-11 DIAGNOSIS — R293 Abnormal posture: Secondary | ICD-10-CM | POA: Insufficient documentation

## 2012-03-11 LAB — CBC WITH DIFFERENTIAL/PLATELET
Eosinophils Absolute: 0 10*3/uL (ref 0.0–0.5)
LYMPH%: 46.3 % (ref 14.0–49.7)
MONO#: 0.6 10*3/uL (ref 0.1–0.9)
NEUT#: 3.2 10*3/uL (ref 1.5–6.5)
Platelets: 277 10*3/uL (ref 145–400)
RBC: 4.73 10*6/uL (ref 3.70–5.45)
WBC: 7.2 10*3/uL (ref 3.9–10.3)
lymph#: 3.3 10*3/uL (ref 0.9–3.3)

## 2012-03-11 LAB — COMPREHENSIVE METABOLIC PANEL (CC13)
Albumin: 3.9 g/dL (ref 3.5–5.0)
CO2: 28 mEq/L (ref 22–29)
Calcium: 9.7 mg/dL (ref 8.4–10.4)
Chloride: 106 mEq/L (ref 98–107)
Glucose: 84 mg/dl (ref 70–99)
Potassium: 3.8 mEq/L (ref 3.5–5.1)
Sodium: 141 mEq/L (ref 136–145)
Total Bilirubin: 0.42 mg/dL (ref 0.20–1.20)
Total Protein: 7.5 g/dL (ref 6.4–8.3)

## 2012-03-11 NOTE — Progress Notes (Signed)
Radiation Oncology         [phone] ________________________________  Initial outpatient Consultation  Name: Brooke Wood MRN: [id number]  Date: 03/11/2012  DOB: [birth year]  CC:Brooke Linger, MD  Brooke Askew, MD   REFERRING PHYSICIAN: Robyne Askew, MD  DIAGNOSIS: The encounter diagnosis was Cancer of upper-outer quadrant of female breast.  HISTORY OF PRESENT ILLNESS::Brooke Wood is a 46 y.o. female who is seen as part of the multidisciplinary breast clinic. Earlier this year she palpated a mass in the upper outer quadrant of her right breast. She denied any actual pain in the breast area,  nipple discharge or bleeding. A digital diagnostic bilateral mammogram was performed which revealed a spiculated mass in the upper outer quadrant of the right breast. This measured 2.8 cm on mammography. Ultrasound confirmed a 2.9 x 2.5 x 2.5 cm mass in the 11:00 position of the right breast, 8 cm from the nipple area. A biopsy was performed which revealed an revealed invasive mammary carcinoma with in situ carcinoma noted. The tumor was estrogen receptor positive as well as progesterone receptor positive and HER-2/neu negative. An MRI of the chest/breast area revealed a solitary 2.7 x 2.0 x 2.5 cm mass in the upper-outer quadrant of the right breast. There was no suspicious lymphadenopathy appreciated. The tumor was noted to have lobular features.Marland Kitchen  PREVIOUS RADIATION THERAPY: No  PAST MEDICAL HISTORY:  has a past medical history of Allergy; Hypertension; Vocal cord dysfunction; and Breast cancer.    PAST SURGICAL HISTORY:No past surgical history on file.  FAMILY HISTORY: family history includes Hypertension in her other.  There is no history of Alcohol abuse, and Diabetes, and Early death, and Hearing loss, and Heart disease, and Hyperlipidemia, and Stroke, and Kidney disease, .  SOCIAL HISTORY:  reports that she has never smoked. She has never used smokeless tobacco. She reports that she  drinks about 2.4 ounces of alcohol per week. She reports that she does not use illicit drugs.  ALLERGIES: Aspirin; Codeine; and Codeine phosphate  MEDICATIONS:  Current Outpatient Prescriptions  Medication Sig Dispense Refill  . cetirizine (ZYRTEC) 10 MG tablet Take 1 tablet (10 mg total) by mouth daily.  30 tablet  11  . Norgestim-Eth Estrad Triphasic (ORTHO TRI-CYCLEN, 28, PO) Take 1 tablet by mouth daily.        . valsartan-hydrochlorothiazide (DIOVAN-HCT) 160-25 MG per tablet Take 1 tablet by mouth daily.        REVIEW OF SYSTEMS:  A 15 point review of systems is documented in the electronic medical record. This was obtained by the nursing staff. However, I reviewed this with the patient to discuss relevant findings and make appropriate changes.  She denies any pain in the right breast area nipple discharge or bleeding prior to diagnosis.   PHYSICAL EXAM: In general this is a very pleasant 46 year old female in no acute distress. She is accompanied by a good friend on evaluation today. Examination of the neck and supraclavicular region reveals no evidence of adenopathy. The axillary areas are free of adenopathy. Examination of the lungs reveals them to be clear. The heart has a regular rhythm and rate.  Examination of the left breast reveals it to be  pendulous without mass or nipple discharge. Examination of the right breast reveals a palpable mass in the upper outer quadrant which is estimated to be approximately 2.5 x 3 cm in size. There is no nipple discharge or bleeding noted.   LABORATORY DATA:  Lab Results  Component Value Date   WBC 7.2 03/11/2012   HGB 14.0 03/11/2012   HCT 42.4 03/11/2012   MCV 89.6 03/11/2012   PLT 277 03/11/2012   Lab Results  Component Value Date   NA 141 03/11/2012   K 3.8 03/11/2012   CL 106 03/11/2012   CO2 28 03/11/2012   Lab Results  Component Value Date   ALT 9 03/11/2012   AST 8 03/11/2012   ALKPHOS 160* 03/11/2012   BILITOT 0.42  03/11/2012     RADIOGRAPHY: US Breast Right  02/21/2012  *RADIOLOGY REPORT*  Clinical Data:  Palpable mass in the upper-outer quadrant of the right breast.  DIGITAL DIAGNOSTIC BILATERAL MAMMOGRAM WITH CAD AND RIGHT BREAST ULTRASOUND:  Comparison:  04/10/2011 and earlier  Findings:  Breast parenchyma is heterogeneously dense.  A spiculated mass is identified in the upper-outer quadrant of the right breast, marked with a BB as the area of concern.  Mass measures 2.8 cm mammographically.  On the left, no suspicious findings are identified. Mammographic images were processed with CAD.  On physical exam, I palpate a discrete rounded mass in the upper- outer quadrant of the right breast.  This is mobile and nontender.  Ultrasound is performed, showing a spiculated mass in the 11 o'clock location of the right breast, 8 cm from the nipple.  This measures 2.9 x 2.5 x 2.5 cm.  There is associated dense acoustic shadowing.  Within the mass, vascularity is identified on Doppler evaluation.  Evaluation of the right axilla shows no adenopathy.  IMPRESSION: Suspicious mass in the 11 o'clock location of the right breast for which biopsy is recommended.  RECOMMENDATION: Ultrasound guided core biopsy is recommended and has been scheduled 03/04/2012 at 3 o'clock p.m.  I have discussed the findings and recommendations with the patient. Results were also provided in writing at the conclusion of the visit.  BI-RADS CATEGORY 5:  Highly suggestive of malignancy - appropriate action should be taken.   Original Report Authenticated By: Norva Pavlov, M.D.    Mr Breast Bilateral W Wo Contrast  03/10/2012  *RADIOLOGY REPORT*  Clinical Data: Biopsy-proven invasive mammary carcinoma in the 10 o'clock region of the right breast.  BILATERAL BREAST MRI WITH AND WITHOUT CONTRAST  Technique: Multiplanar, multisequence MR images of both breasts were obtained prior to and following the intravenous administration of 14ml of multihance.  Three  dimensional images were evaluated at the independent DynaCad workstation.  Comparison:  Mammograms dated 03/04/2012, 02/21/2012, 04/10/2011 and 03/15/2010.  Findings: There is a 2.7 x 2.0 x 2.5 cm (anterior-posterior, transverse and longitudinal dimensions) irregular, spiculated, enhancing mass in the posterior third of the upper outer quadrant of the right breast. There is an associated biopsy clip artifact.  There is no abnormal enhancement in the left breast.  No enlarged axillary or internal mammary adenopathy is detected.  IMPRESSION: Solitary enhancing 2.7 cm mass in the upper outer quadrant of the right breast corresponding with the known malignancy.  RECOMMENDATION: Treatment planning is recommended.  THREE-DIMENSIONAL MR IMAGE RENDERING ON INDEPENDENT WORKSTATION:  Three-dimensional MR images were rendered by post-processing of the original MR data on an independent workstation.  The three- dimensional MR images were interpreted, and findings were reported in the accompanying complete MRI report for this study.  BI-RADS CATEGORY 6:  Known biopsy-proven malignancy - appropriate action should be taken.   Original Report Authenticated By: Baird Lyons, M.D.    [place] Core Biopsy  03/04/2012  *RADIOLOGY REPORT*  Clinical Data:  Right breast mass.  ULTRASOUND GUIDED CORE BIOPSY OF THE right BREAST  Comparison: Previous exams.  I met with the patient and we discussed the procedure of ultrasound- guided biopsy, including benefits and alternatives.  We discussed the high likelihood of a successful procedure. We discussed the risks of the procedure, including infection, bleeding, tissue injury, clip migration, and inadequate sampling.  Informed written consent was given.  Using sterile technique 2% lidocaine, ultrasound guidance and a 14 gauge automated biopsy device, biopsy was performed of the palpable mass located within the right breast at the 10 o'clock position using a lateral / inferior approach.  At the  conclusion of the procedure a ribbon shaped tissue marker clip was deployed into the biopsy cavity.  Follow up 2 view mammogram was performed and dictated separately.  IMPRESSION: Ultrasound guided biopsy of the palpable right breast mass located at 10 o'clock position.  No apparent complications.   Original Report Authenticated By: Rolla Plate, M.D.    Mm Digital Diagnostic Bilat  02/21/2012  *RADIOLOGY REPORT*  Clinical Data:  Palpable mass in the upper-outer quadrant of the right breast.  DIGITAL DIAGNOSTIC BILATERAL MAMMOGRAM WITH CAD AND RIGHT BREAST ULTRASOUND:  Comparison:  04/10/2011 and earlier  Findings:  Breast parenchyma is heterogeneously dense.  A spiculated mass is identified in the upper-outer quadrant of the right breast, marked with a BB as the area of concern.  Mass measures 2.8 cm mammographically.  On the left, no suspicious findings are identified. Mammographic images were processed with CAD.  On physical exam, I palpate a discrete rounded mass in the upper- outer quadrant of the right breast.  This is mobile and nontender.  Ultrasound is performed, showing a spiculated mass in the 11 o'clock location of the right breast, 8 cm from the nipple.  This measures 2.9 x 2.5 x 2.5 cm.  There is associated dense acoustic shadowing.  Within the mass, vascularity is identified on Doppler evaluation.  Evaluation of the right axilla shows no adenopathy.  IMPRESSION: Suspicious mass in the 11 o'clock location of the right breast for which biopsy is recommended.  RECOMMENDATION: Ultrasound guided core biopsy is recommended and has been scheduled 03/04/2012 at 3 o'clock p.m.  I have discussed the findings and recommendations with the patient. Results were also provided in writing at the conclusion of the visit.  BI-RADS CATEGORY 5:  Highly suggestive of malignancy - appropriate action should be taken.   Original Report Authenticated By: Norva Pavlov, M.D.    Mm Digital Diagnostic Unilat  R  03/04/2012  *RADIOLOGY REPORT*  Clinical Data:  Post right breast ultrasound guided core biopsy.  DIGITAL DIAGNOSTIC RIGHT BREAST MAMMOGRAM  Comparison:  Previous exams.  Findings:  Films are performed following ultrasound guided biopsy of the palpable mass located within the right breast at the 10 o'clock position.  The ribbon shaped clip is in appropriate position.  IMPRESSION: Appropriate positioning of clip following right breast ultrasound guided core biopsy.   Original Report Authenticated By: Rolla Plate, M.D.    Mm Radiologist Eval And Mgmt  03/06/2012  *RADIOLOGY REPORT*  ESTABLISHED PATIENT OFFICE VISIT - LEVEL II [phone])  Chief Complaint: The patient returns for followup of ultrasound guided biopsy.  History:  Palpable lump, right breast.  Exam:  The biopsy site is clean and dry without significant bruising.  Pathology: Biopsy results reveal invasive ductal carcinoma, possibly lobular.  Assessment and Plan:   The patient will be seen at Multidisciplinary Clinic on 03/11/2012.  A breast MRI  is scheduled for 03/10/12.   Original Report Authenticated By: Vincenza Hews, M.D.       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 The patient would be a good candidate for breast conservation therapy with partial mastectomy,  sentinel node procedure and radiation therapy.  Since this tumor appears to be lobular then she would not likely respond well to neoadjuvant therapy.  PLAN: The patient will be seen in the postoperative setting for radiation therapy planning and further evaluation     ------------------------------------------------   Billie Lade, PhD, MD

## 2012-03-11 NOTE — Patient Instructions (Addendum)
Proceed with surgery  oncotype Dx on final pathology

## 2012-03-11 NOTE — Progress Notes (Signed)
Subjective:     Patient ID: Brooke Wood, female   DOB: [birth year], 46 y.o.   MRN: [id number]  HPI We are asked to the patient in consultation by Dr. Jean Rosenthal to evaluate her for a right-sided breast cancer. The patient is a 46 year old black female who first noticed a lump in the upper outer right breast about a month ago. Her last mammogram was in January. She denies any breast pain or discharge from her nipple. She quit taking her birth control pills today. The mass was biopsied and came back as a lobular breast cancer. She was ER and PR positive and HER-2 negative. Her Ki-67 was 20%. Her ultrasound and MRI estimated the size to be 2.9 cm.  Review of Systems  Constitutional: Negative.   HENT: Negative.   Eyes: Negative.   Respiratory: Negative.   Cardiovascular: Negative.   Gastrointestinal: Negative.   Genitourinary: Negative.   Musculoskeletal: Negative.   Skin: Negative.   Neurological: Negative.   Hematological: Negative.   Psychiatric/Behavioral: Negative.        Objective:   Physical Exam  Constitutional: She is oriented to person, place, and time. She appears well-developed and well-nourished.  HENT:  Head: Normocephalic and atraumatic.  Eyes: Conjunctivae normal and EOM are normal. Pupils are equal, round, and reactive to light.  Neck: Normal range of motion. Neck supple.  Cardiovascular: Normal rate, regular rhythm and normal heart sounds.   Pulmonary/Chest: Effort normal and breath sounds normal.       There is a palpable mass in the upper outer quadrant measuring about 3 cm of the right breast. There is no palpable mass in the left breast. There is no palpable axillary supraclavicular cervical lymphadenopathy  Abdominal: Soft. Bowel sounds are normal. She exhibits no mass. There is no tenderness.  Musculoskeletal: Normal range of motion.  Lymphadenopathy:    She has no cervical adenopathy.  Neurological: She is alert and oriented to person, place, and time.  Skin:  Skin is warm and dry.  Psychiatric: She has a normal mood and affect. Her behavior is normal.       Assessment:     The patient has a 2.9 cm lobular cancer in the upper outer right breast. Because she has a large breast I think she would still be a candidate for breast conservation. I've discussed with her in detail the different options for surgical treatment of this cancer and she favors breast conservation. I've also talk to her about sentinel node mapping which is also good candidate for. I discussed with her in detail the risks and benefits of the operation as well as some of the technical aspects and she understands and wishes to proceed    Plan:     Plan for right breast lumpectomy and sentinel node mapping

## 2012-03-11 NOTE — Progress Notes (Signed)
Brooke Wood [id number] 07-16-1965 46 y.o. 03/11/2012 1:51 PM  CC  [name], MD [address] [facility] [address] [place], [address] [place] [address] Dr. Chevis Pretty Dr. Antony Blackbird  REASON FOR CONSULTATION:  46 year old female presented with a self palpable mass the right breast found to be invasive lobular carcinoma measuring 2.9 cm by ultrasound. Clinical stage II Patient was seen in the Multidisciplinary Breast Clinic for discussion of her treatment options.   STAGE:   No matching staging information was found for the patient.  REFERRING PHYSICIAN: Dr. Chevis Pretty  HISTORY OF PRESENT ILLNESS:  Brooke Wood is a 46 y.o. female.  With medical history significant for hypertension. Patient also has had a prior history of surgery on her right breast in the 1980s he was just the breast tissue but no other malignancy was found. Patient recently palpated a mass in her right breast. Because of this she went on to have a mammogram that showed an abnormal mass. Ultrasound confirmed this to be about 2.9 cm. She had a biopsy performed on 03/05/2012 that showed invasive lobular carcinoma that was ER positive PR positive HER-2/neu negative with a Ki-67 15% grade 2. She had an MRI performed that showed the mass to be about 2.7 cm it was a solitary mass. Patient's case was discussed at the multidisciplinary breast conference. Her pathology and radiology were reviewed. She is without any complaints. She was also seen by Dr. Chevis Pretty and Dr. Antony Blackbird. Recommendations are based on NCCN guidelines for stage II estrogen receptor positive breast cancer.   Past Medical History: Past Medical History  Diagnosis Date  . Allergy     Neg RAST 2007  . Hypertension   . Vocal cord dysfunction     Neg Methacholine challenge test 2007    Past Surgical History: No past surgical history on file.  Family History: Family History  Problem Relation Age of Onset  . Hypertension Other   . Alcohol abuse  Neg Hx   . Diabetes Neg Hx   . Early death Neg Hx   . Hearing loss Neg Hx   . Heart disease Neg Hx   . Hyperlipidemia Neg Hx   . Stroke Neg Hx   . Kidney disease Neg Hx     Social History History  Substance Use Topics  . Smoking status: Never Smoker   . Smokeless tobacco: Never Used  . Alcohol Use: 2.4 oz/week    4 Glasses of wine per week    Allergies: Allergies  Allergen Reactions  . Aspirin   . Codeine   . Codeine Phosphate     REACTION: unspecified    Current Medications: Current Outpatient Prescriptions  Medication Sig Dispense Refill  . cetirizine (ZYRTEC) 10 MG tablet Take 1 tablet (10 mg total) by mouth daily.  30 tablet  11  . Norgestim-Eth Estrad Triphasic (ORTHO TRI-CYCLEN, 28, PO) Take 1 tablet by mouth daily.        . valsartan-hydrochlorothiazide (DIOVAN-HCT) 160-25 MG per tablet Take 1 tablet by mouth daily.        OB/GYN History:Patient had menarche at age 79 she is premenopausal she's had one live birth at the age of 62. She has completed her family.  Fertility Discussion:Not applicable Prior History of Cancer:No  Health Maintenance:  ColonoscopyNo Bone DensityNo Last PAP smearUp-to-date  ECOG PERFORMANCE STATUS: 0 - Asymptomatic  Genetic Counseling/testing:D2 early-onset breast cancer patient will be referred to genetic counseling and testing  REVIEW OF  SYSTEMS:  A comprehensive review of systems was negative.  PHYSICAL EXAMINATION: Blood pressure 152/106, pulse 87, temperature 98.3 F (36.8 C), resp. rate 20, height 5' 5.3" (1.659 m), weight 156 lb (70.761 kg), last menstrual period 03/08/2012.  ZOX:WRUEA, healthy, no distress, well nourished and well developed SKIN: skin color, texture, turgor are normal HEAD: Normocephalic EYES: PERRLA, EOMI EARS: External ears normal OROPHARYNX:no exudate and no erythema  NECK: supple, no adenopathy LYMPH:  no palpable lymphadenopathy BREAST:Bilateral breast exam right breast reveals a palpable  mass measuring about 2.7 cm there is no nipple retraction inversion area of ecchymosis is noted. Left breast no masses or nipple discharge LUNGS: clear to auscultation and percussion HEART: regular rate & rhythm ABDOMEN:abdomen soft, non-tender, normal bowel sounds and no masses or organomegaly BACK: Back symmetric, no curvature., No CVA tenderness EXTREMITIES:no edema, no clubbing, no cyanosis  NEURO: alert & oriented x 3 with fluent speech, no focal motor/sensory deficits, gait normal, reflexes normal and symmetric     STUDIES/RESULTS: US Breast Right  02/21/2012  *RADIOLOGY REPORT*  Clinical Data:  Palpable mass in the upper-outer quadrant of the right breast.  DIGITAL DIAGNOSTIC BILATERAL MAMMOGRAM WITH CAD AND RIGHT BREAST ULTRASOUND:  Comparison:  04/10/2011 and earlier  Findings:  Breast parenchyma is heterogeneously dense.  A spiculated mass is identified in the upper-outer quadrant of the right breast, marked with a BB as the area of concern.  Mass measures 2.8 cm mammographically.  On the left, no suspicious findings are identified. Mammographic images were processed with CAD.  On physical exam, I palpate a discrete rounded mass in the upper- outer quadrant of the right breast.  This is mobile and nontender.  Ultrasound is performed, showing a spiculated mass in the 11 o'clock location of the right breast, 8 cm from the nipple.  This measures 2.9 x 2.5 x 2.5 cm.  There is associated dense acoustic shadowing.  Within the mass, vascularity is identified on Doppler evaluation.  Evaluation of the right axilla shows no adenopathy.  IMPRESSION: Suspicious mass in the 11 o'clock location of the right breast for which biopsy is recommended.  RECOMMENDATION: Ultrasound guided core biopsy is recommended and has been scheduled 03/04/2012 at 3 o'clock p.m.  I have discussed the findings and recommendations with the patient. Results were also provided in writing at the conclusion of the visit.  BI-RADS  CATEGORY 5:  Highly suggestive of malignancy - appropriate action should be taken.   Original Report Authenticated By: Norva Pavlov, M.D.    Mr Breast Bilateral W Wo Contrast  03/10/2012  *RADIOLOGY REPORT*  Clinical Data: Biopsy-proven invasive mammary carcinoma in the 10 o'clock region of the right breast.  BILATERAL BREAST MRI WITH AND WITHOUT CONTRAST  Technique: Multiplanar, multisequence MR images of both breasts were obtained prior to and following the intravenous administration of 14ml of multihance.  Three dimensional images were evaluated at the independent DynaCad workstation.  Comparison:  Mammograms dated 03/04/2012, 02/21/2012, 04/10/2011 and 03/15/2010.  Findings: There is a 2.7 x 2.0 x 2.5 cm (anterior-posterior, transverse and longitudinal dimensions) irregular, spiculated, enhancing mass in the posterior third of the upper outer quadrant of the right breast. There is an associated biopsy clip artifact.  There is no abnormal enhancement in the left breast.  No enlarged axillary or internal mammary adenopathy is detected.  IMPRESSION: Solitary enhancing 2.7 cm mass in the upper outer quadrant of the right breast corresponding with the known malignancy.  RECOMMENDATION: Treatment planning is recommended.  THREE-DIMENSIONAL MR  IMAGE RENDERING ON INDEPENDENT WORKSTATION:  Three-dimensional MR images were rendered by post-processing of the original MR data on an independent workstation.  The three- dimensional MR images were interpreted, and findings were reported in the accompanying complete MRI report for this study.  BI-RADS CATEGORY 6:  Known biopsy-proven malignancy - appropriate action should be taken.   Original Report Authenticated By: Baird Lyons, M.D.    [place] Core Biopsy  03/04/2012  *RADIOLOGY REPORT*  Clinical Data:  Right breast mass.  ULTRASOUND GUIDED CORE BIOPSY OF THE right BREAST  Comparison: Previous exams.  I met with the patient and we discussed the procedure of ultrasound-  guided biopsy, including benefits and alternatives.  We discussed the high likelihood of a successful procedure. We discussed the risks of the procedure, including infection, bleeding, tissue injury, clip migration, and inadequate sampling.  Informed written consent was given.  Using sterile technique 2% lidocaine, ultrasound guidance and a 14 gauge automated biopsy device, biopsy was performed of the palpable mass located within the right breast at the 10 o'clock position using a lateral / inferior approach.  At the conclusion of the procedure a ribbon shaped tissue marker clip was deployed into the biopsy cavity.  Follow up 2 view mammogram was performed and dictated separately.  IMPRESSION: Ultrasound guided biopsy of the palpable right breast mass located at 10 o'clock position.  No apparent complications.   Original Report Authenticated By: Rolla Plate, M.D.    Mm Digital Diagnostic Bilat  02/21/2012  *RADIOLOGY REPORT*  Clinical Data:  Palpable mass in the upper-outer quadrant of the right breast.  DIGITAL DIAGNOSTIC BILATERAL MAMMOGRAM WITH CAD AND RIGHT BREAST ULTRASOUND:  Comparison:  04/10/2011 and earlier  Findings:  Breast parenchyma is heterogeneously dense.  A spiculated mass is identified in the upper-outer quadrant of the right breast, marked with a BB as the area of concern.  Mass measures 2.8 cm mammographically.  On the left, no suspicious findings are identified. Mammographic images were processed with CAD.  On physical exam, I palpate a discrete rounded mass in the upper- outer quadrant of the right breast.  This is mobile and nontender.  Ultrasound is performed, showing a spiculated mass in the 11 o'clock location of the right breast, 8 cm from the nipple.  This measures 2.9 x 2.5 x 2.5 cm.  There is associated dense acoustic shadowing.  Within the mass, vascularity is identified on Doppler evaluation.  Evaluation of the right axilla shows no adenopathy.  IMPRESSION: Suspicious mass in  the 11 o'clock location of the right breast for which biopsy is recommended.  RECOMMENDATION: Ultrasound guided core biopsy is recommended and has been scheduled 03/04/2012 at 3 o'clock p.m.  I have discussed the findings and recommendations with the patient. Results were also provided in writing at the conclusion of the visit.  BI-RADS CATEGORY 5:  Highly suggestive of malignancy - appropriate action should be taken.   Original Report Authenticated By: Norva Pavlov, M.D.    Mm Digital Diagnostic Unilat R  03/04/2012  *RADIOLOGY REPORT*  Clinical Data:  Post right breast ultrasound guided core biopsy.  DIGITAL DIAGNOSTIC RIGHT BREAST MAMMOGRAM  Comparison:  Previous exams.  Findings:  Films are performed following ultrasound guided biopsy of the palpable mass located within the right breast at the 10 o'clock position.  The ribbon shaped clip is in appropriate position.  IMPRESSION: Appropriate positioning of clip following right breast ultrasound guided core biopsy.   Original Report Authenticated By: Rolla Plate, M.D.  Mm Radiologist Eval And Mgmt  03/06/2012  *RADIOLOGY REPORT*  ESTABLISHED PATIENT OFFICE VISIT - LEVEL II [phone])  Chief Complaint: The patient returns for followup of ultrasound guided biopsy.  History:  Palpable lump, right breast.  Exam:  The biopsy site is clean and dry without significant bruising.  Pathology: Biopsy results reveal invasive ductal carcinoma, possibly lobular.  Assessment and Plan:   The patient will be seen at Multidisciplinary Clinic on 03/11/2012.  A breast MRI is scheduled for 03/10/12.   Original Report Authenticated By: Vincenza Hews, M.D.      LABS:    Chemistry      Component Value Date/Time   NA 141 03/11/2012 1211   NA 138 07/19/2010 1022   K 3.8 03/11/2012 1211   K 4.3 07/19/2010 1022   CL 106 03/11/2012 1211   CL 105 07/19/2010 1022   CO2 28 03/11/2012 1211   CO2 27 07/19/2010 1022   BUN 10.0 03/11/2012 1211   BUN 10 07/19/2010 1022    CREATININE 0.8 03/11/2012 1211   CREATININE 0.66 03/10/2012 0730      Component Value Date/Time   CALCIUM 9.7 03/11/2012 1211   CALCIUM 9.7 07/19/2010 1022   ALKPHOS 160* 03/11/2012 1211   AST 8 03/11/2012 1211   ALT 9 03/11/2012 1211   BILITOT 0.42 03/11/2012 1211      Lab Results  Component Value Date   WBC 7.2 03/11/2012   HGB 14.0 03/11/2012   HCT 42.4 03/11/2012   MCV 89.6 03/11/2012   PLT 277 03/11/2012   PATHOLOGY: ADDITIONAL INFORMATION: PROGNOSTIC INDICATORS - ACIS Results IMMUNOHISTOCHEMICAL AND MORPHOMETRIC ANALYSIS BY THE AUTOMATED CELLULAR IMAGING SYSTEM (ACIS) Estrogen Receptor (Negative, <1%): 99%, STRONG STAINING INTENSITY Progesterone Receptor (Negative, <1%): 84%, STRONG STAINING INTENSITY Proliferation Marker Ki67 by M IB-1 (Low<20%): 16% All controls stained appropriately Pecola Leisure MD Pathologist, Electronic Signature ( Signed 03/11/2012) CHROMOGENIC IN-SITU HYBRIDIZATION Interpretation HER-2/NEU BY CISH - NO AMPLIFICATION OF HER-2 DETECTED. THE RATIO OF HER-2: CEP 17 SIGNALS WAS 1.30. Reference range: Ratio: HER2:CEP17 < 1.8 - gene amplification not observed Ratio: HER2:CEP 17 1.8-2.2 - equivocal result Ratio: HER2:CEP17 > 2.2 - gene amplification observed Pecola Leisure MD Pathologist, Electronic Signature ( Signed 03/11/2012) 1 of 2 FINAL for [place], Leontina I [phone]) FINAL DIAGNOSIS Diagnosis Breast, right, needle core biopsy, mass, 10 o'clock - INVASIVE MAMMARY CARCINOMA. - MAMMARY CARCINOMA IN SITU. Microscopic Comment The type is best determined at excision; however in these biopsy, the differential includes invasive and in situ lobular carcinoma. Breast prognostic profile will be performed. Dr. Colonel Bald agrees. Called to [facility] on 03/05/12. (JDP:gt, 03/05/12)   ASSESSMENT    46 year old female with  #1 so palpable mass in the right breast measuring 2.9 cm on mammogram ultrasound with MRI measuring 2.7 cm.  Core needle biopsy revealed invasive lobular carcinoma ER positive PR positive HER-2/neu negative. Patient desires breast conservation and she is a good breast conservation candidate. She was seen in the multidisciplinary breast clinic she was seen by surgery and radiation oncology. We discussed pathophysiology of breast cancer and different treatment options including adjuvant therapy. She would be a good candidate for this.  #2 patient will need Oncotype DX testing since her tumor is greater than 1 cm.  #3 we also discussed genetic testing and counseling since she has early age of onset of cancer. Although her family history is not significant.  Clinical Trial Eligibility:No Multidisciplinary conference discussionYes    PLAN:    #1 patient  will proceed with lumpectomy and sentinel lymph node I ACTZ.  #2 on her final pathology we will send tissue for Oncotype DX testing to determine whether or not she would benefit from addition of chemotherapy along with antiestrogen therapy which would consist of tamoxifen since she is premenopausal.  #3 she certainly will need radiation therapy and she was seen by her radiation oncologist today.  #4 patient is also refer her to genetic counseling and testing.       Discussion: Patient is being treated per NCCN breast cancer care guidelines appropriate for stage II   Thank you so much for allowing me to participate in the care of ROSANA FARNELL. I will continue to follow up the patient with you and assist in her care.  All questions were answered. The patient knows to call the clinic with any problems, questions or concerns. We can certainly see the patient much sooner if necessary.  I spent 60 minutes counseling the patient face to face. The total time spent in the appointment was 60 minutes.  Drue Second, MD Medical/Oncology [facility] [phone] (beeper) [phone] (Office)  03/11/2012, 1:51 PM

## 2012-03-11 NOTE — Progress Notes (Signed)
Checked in new pt with no financial concerns. °

## 2012-03-11 NOTE — Telephone Encounter (Signed)
gve the pt her jan 2014 appt calendar °

## 2012-03-16 ENCOUNTER — Ambulatory Visit: Payer: Self-pay | Admitting: Internal Medicine

## 2012-03-16 ENCOUNTER — Telehealth: Payer: Self-pay | Admitting: *Deleted

## 2012-03-16 NOTE — Telephone Encounter (Signed)
Spoke to pt concerning [facility] from 03/11/12.  Pt denies questions or concerns regarding dx or treatment care plan.  R/S f/u appt to 05/19/11 d/t surgery date and oncotype dx testing.  Confirmed new appt with pt.  Encourage pt to call with needs.  Received verbal understanding.  Contact information given.

## 2012-03-17 ENCOUNTER — Encounter: Payer: Self-pay | Admitting: *Deleted

## 2012-03-17 ENCOUNTER — Telehealth: Payer: Self-pay | Admitting: *Deleted

## 2012-03-17 NOTE — Telephone Encounter (Signed)
Patient confirmed over the phone the new date and time on 05-01-2012 at 12:30pm

## 2012-03-18 ENCOUNTER — Encounter: Payer: Self-pay | Admitting: Oncology

## 2012-03-18 NOTE — Progress Notes (Signed)
Pt is approved for 100% financial assistance effective 03/18/12 - 09/16/12.  I will mail approval letter and green card today.

## 2012-03-19 ENCOUNTER — Encounter: Payer: Self-pay | Admitting: Genetic Counselor

## 2012-03-19 ENCOUNTER — Other Ambulatory Visit: Payer: Self-pay | Admitting: Lab

## 2012-03-19 ENCOUNTER — Ambulatory Visit (HOSPITAL_BASED_OUTPATIENT_CLINIC_OR_DEPARTMENT_OTHER): Payer: Self-pay | Admitting: Genetic Counselor

## 2012-03-19 DIAGNOSIS — C50419 Malignant neoplasm of upper-outer quadrant of unspecified female breast: Secondary | ICD-10-CM

## 2012-03-19 DIAGNOSIS — IMO0002 Reserved for concepts with insufficient information to code with codable children: Secondary | ICD-10-CM

## 2012-03-19 NOTE — Progress Notes (Signed)
Dr.  Drue Second requested a consultation for genetic counseling and risk assessment for Brooke Wood, a 46 y.o. female, for discussion of her personal history of lobular breast cancer. She presents to clinic today to discuss the possibility of a genetic predisposition to cancer, and to further clarify her risks, as well as her family members' risks for cancer.   HISTORY OF PRESENT ILLNESS: In 2013, at the age of 20, Brooke Wood was diagnosed with lobular invasive carcinoma of the breast.  Surgery is scheduled for April 09, 2012.    Past Medical History  Diagnosis Date  . Allergy     Neg RAST 2007  . Hypertension   . Vocal cord dysfunction     Neg Methacholine challenge test 2007  . Breast cancer     History reviewed. No pertinent past surgical history.  History  Substance Use Topics  . Smoking status: Never Smoker   . Smokeless tobacco: Never Used  . Alcohol Use: 2.4 oz/week    4 Glasses of wine per week    REPRODUCTIVE HISTORY AND PERSONAL RISK ASSESSMENT FACTORS: Menarche was at age 67-14.   Premenopausal Uterus Intact: Yes Ovaries Intact: Yes G1P1A0 , first live birth at age 77  She has not previously undergone treatment for infertility.   OCP use for 25 years   She has not used HRT in the past.    FAMILY HISTORY:  We obtained a detailed, 4-generation family history.  Significant diagnoses are listed below: Family History  Problem Relation Age of Onset  . Hypertension Other   . Alcohol abuse Neg Hx   . Diabetes Neg Hx   . Early death Neg Hx   . Hearing loss Neg Hx   . Heart disease Neg Hx   . Hyperlipidemia Neg Hx   . Stroke Neg Hx   . Kidney disease Neg Hx   . Lung cancer Father 79    smoker  The patient was diagnsoed with breast cancer at age 26.  She has two brothers who have never had cancer.  Her father was a smoker and died of lung cancer in his 58s.  There is no other reported cases of cancer.  Patient's maternal ancestors are of unknown  descent, and paternal ancestors are of unknown descent. There is no reported Ashkenazi Jewish ancestry. There is no  known consanguinity.  GENETIC COUNSELING RISK ASSESSMENT, DISCUSSION, AND SUGGESTED FOLLOW UP: We reviewed the natural history and genetic etiology of sporadic, familial and hereditary cancer syndromes.  About 5-10% of breast cancer is hereditary.  Of this, about 85% is the result of a BRCA1 or BRCA2 mutation.  We reviewed the red flags of hereditary cancer syndromes and the dominant inheritance patterns. The patient does not have any insurance so we will get her tested through [facility]'s financial assistance program.  The patient's personal history of lobular breast cancer is suggestive of the following possible diagnosis: possible hereditary cancer syndrome  We discussed that identification of a hereditary cancer syndrome may help her care providers tailor the patients medical management. If a mutation indicating a hereditary cancer syndrome is detected in this case, the [employer] recommendations would include increased cancer surveilleince and possible prophylactic surgery. If a mutation is detected, the patient will be referred back to the referring provider and to any additional appropriate care providers to discuss the relevant options.   If a mutation is not found in the patient, this will decrease the likelihood of a  hereditary cancer syndrome as the explanation for her breast caner. Cancer surveillance options would be discussed for the patient according to the appropriate standard National Comprehensive Cancer Network and American Cancer Society guidelines, with consideration of their personal and family history risk factors. In this case, the patient will be referred back to their care providers for discussions of management.   After considering the risks, benefits, and limitations, the patient provided informed consent for  the following  testing:  BRACAnalysis with [employer] through [employer].   Per the patient's request, we will contact her by telephone to discuss these results. A follow up genetic counseling visit will be scheduled if indicated.  The patient was seen for a total of 60 minutes, greater than 50% of which was spent face-to-face counseling.  This plan is being carried out per Dr. Feliz Beam recommendations.  This note will also be sent to the referring provider via the electronic medical record. The patient will be supplied with a summary of this genetic counseling discussion as well as educational information on the discussed hereditary cancer syndromes following the conclusion of their visit.   Patient was discussed with Dr. Drue Second. _______________________________________________________________________ For Office Staff:  Number of people involved in session: 2 Was an Intern/ student involved with case: no

## 2012-03-23 ENCOUNTER — Encounter: Payer: Self-pay | Admitting: *Deleted

## 2012-03-23 NOTE — Progress Notes (Signed)
Mailed after appt letter to pt. 

## 2012-04-01 DIAGNOSIS — Z923 Personal history of irradiation: Secondary | ICD-10-CM

## 2012-04-01 DIAGNOSIS — C50919 Malignant neoplasm of unspecified site of unspecified female breast: Secondary | ICD-10-CM

## 2012-04-01 HISTORY — PX: BREAST LUMPECTOMY: SHX2

## 2012-04-01 HISTORY — DX: Personal history of irradiation: Z92.3

## 2012-04-01 HISTORY — DX: Malignant neoplasm of unspecified site of unspecified female breast: C50.919

## 2012-04-02 NOTE — Addendum Note (Signed)
Encounter addended by: Saintclair Halsted, RN on: 04/02/2012  4:34 PM<BR>     Documentation filed: Charges VN

## 2012-04-07 ENCOUNTER — Encounter (HOSPITAL_BASED_OUTPATIENT_CLINIC_OR_DEPARTMENT_OTHER): Payer: Self-pay | Admitting: *Deleted

## 2012-04-07 ENCOUNTER — Other Ambulatory Visit: Payer: Self-pay

## 2012-04-07 NOTE — Progress Notes (Signed)
Here for ekg-labs done [facility] 03[facility]11/12-hx asthma-states not now-no meds or inhalers Off BCP-only takes HTN meds

## 2012-04-07 NOTE — Progress Notes (Signed)
i called [facility] about time of nuc med and time of surgery same-they will look into this-will have pt come here [facility]

## 2012-04-09 ENCOUNTER — Encounter (HOSPITAL_BASED_OUTPATIENT_CLINIC_OR_DEPARTMENT_OTHER)
Admission: RE | Disposition: A | Discharge status: Home / Self Care | Payer: Self-pay | Source: Ambulatory Visit | Attending: General Surgery

## 2012-04-09 ENCOUNTER — Encounter (HOSPITAL_BASED_OUTPATIENT_CLINIC_OR_DEPARTMENT_OTHER): Payer: Self-pay | Admitting: Anesthesiology

## 2012-04-09 ENCOUNTER — Ambulatory Visit (HOSPITAL_BASED_OUTPATIENT_CLINIC_OR_DEPARTMENT_OTHER): Payer: Medicaid Other | Admitting: Anesthesiology

## 2012-04-09 ENCOUNTER — Encounter (HOSPITAL_BASED_OUTPATIENT_CLINIC_OR_DEPARTMENT_OTHER): Payer: Self-pay | Admitting: *Deleted

## 2012-04-09 ENCOUNTER — Telehealth: Payer: Self-pay | Admitting: Genetic Counselor

## 2012-04-09 ENCOUNTER — Ambulatory Visit (HOSPITAL_BASED_OUTPATIENT_CLINIC_OR_DEPARTMENT_OTHER)
Admission: RE | Admit: 2012-04-09 | Discharge: 2012-04-09 | Disposition: A | Discharge status: Home / Self Care | Payer: Medicaid Other | Source: Ambulatory Visit | Attending: General Surgery | Admitting: General Surgery

## 2012-04-09 ENCOUNTER — Encounter (HOSPITAL_COMMUNITY)
Admission: RE | Admit: 2012-04-09 | Discharge: 2012-04-09 | Disposition: A | Discharge status: Home / Self Care | Payer: Medicaid Other | Source: Ambulatory Visit | Attending: General Surgery | Admitting: General Surgery

## 2012-04-09 DIAGNOSIS — C50419 Malignant neoplasm of upper-outer quadrant of unspecified female breast: Secondary | ICD-10-CM | POA: Insufficient documentation

## 2012-04-09 DIAGNOSIS — C50919 Malignant neoplasm of unspecified site of unspecified female breast: Secondary | ICD-10-CM

## 2012-04-09 DIAGNOSIS — I10 Essential (primary) hypertension: Secondary | ICD-10-CM | POA: Insufficient documentation

## 2012-04-09 DIAGNOSIS — Z170 Estrogen receptor positive status [ER+]: Secondary | ICD-10-CM | POA: Insufficient documentation

## 2012-04-09 HISTORY — PX: BREAST LUMPECTOMY WITH SENTINEL LYMPH NODE BIOPSY: SHX5597

## 2012-04-09 LAB — POCT I-STAT, CHEM 8
Calcium, Ion: 1.22 mmol/L (ref 1.12–1.23)
Chloride: 98 mEq/L (ref 96–112)
Glucose, Bld: 127 mg/dL — ABNORMAL HIGH (ref 70–99)
HCT: 43 % (ref 36.0–46.0)
TCO2: 31 mmol/L (ref 0–100)

## 2012-04-09 SURGERY — BREAST LUMPECTOMY WITH SENTINEL LYMPH NODE BX
Anesthesia: General | Site: Breast | Laterality: Right | Wound class: Clean

## 2012-04-09 MED ORDER — OXYCODONE HCL 5 MG/5ML PO SOLN: 5.0000 mg | Freq: Once | ORAL | Status: DC | PRN

## 2012-04-09 MED ORDER — TECHNETIUM TC 99M SULFUR COLLOID FILTERED
1.0000 | Freq: Once | INTRAVENOUS | Status: AC | PRN
  Administered 2012-04-09: 1 via INTRADERMAL

## 2012-04-09 MED ORDER — TRAMADOL HCL 50 MG PO TABS: 50.0000 mg | ORAL_TABLET | Freq: Four times a day (QID) | ORAL | Status: DC | PRN

## 2012-04-09 MED ORDER — SODIUM CHLORIDE 0.9 % IJ SOLN
INTRAMUSCULAR | Status: DC | PRN
  Administered 2012-04-09: 08:00:00 via INTRAMUSCULAR

## 2012-04-09 MED ORDER — OXYCODONE HCL 5 MG PO TABS: 5.0000 mg | ORAL_TABLET | Freq: Once | ORAL | Status: DC | PRN

## 2012-04-09 MED ORDER — LACTATED RINGERS IV SOLN
INTRAVENOUS | Status: DC
  Administered 2012-04-09: 09:00:00 via INTRAVENOUS
  Administered 2012-04-09: 10 mL/h via INTRAVENOUS
  Administered 2012-04-09: 07:00:00 via INTRAVENOUS

## 2012-04-09 MED ORDER — 0.9 % SODIUM CHLORIDE (POUR BTL) OPTIME
TOPICAL | Status: DC | PRN
  Administered 2012-04-09: 150 mL

## 2012-04-09 MED ORDER — MIDAZOLAM HCL 5 MG/5ML IJ SOLN
INTRAMUSCULAR | Status: DC | PRN
  Administered 2012-04-09: 2 mg via INTRAVENOUS

## 2012-04-09 MED ORDER — LIDOCAINE HCL (CARDIAC) 20 MG/ML IV SOLN
INTRAVENOUS | Status: DC | PRN
  Administered 2012-04-09: 80 mg via INTRAVENOUS

## 2012-04-09 MED ORDER — MIDAZOLAM HCL 2 MG/2ML IJ SOLN
1.0000 mg | INTRAMUSCULAR | Status: DC | PRN
  Administered 2012-04-09: 1 mg via INTRAVENOUS

## 2012-04-09 MED ORDER — CHLORHEXIDINE GLUCONATE 4 % EX LIQD: 1.0000 "application " | Freq: Once | CUTANEOUS | Status: DC

## 2012-04-09 MED ORDER — ONDANSETRON HCL 4 MG/2ML IJ SOLN
INTRAMUSCULAR | Status: DC | PRN
  Administered 2012-04-09: 4 mg via INTRAVENOUS

## 2012-04-09 MED ORDER — FENTANYL CITRATE 0.05 MG/ML IJ SOLN
50.0000 ug | Freq: Once | INTRAMUSCULAR | Status: AC
  Administered 2012-04-09: 50 ug via INTRAVENOUS

## 2012-04-09 MED ORDER — BUPIVACAINE HCL (PF) 0.25 % IJ SOLN
INTRAMUSCULAR | Status: DC | PRN
  Administered 2012-04-09: 20 mL

## 2012-04-09 MED ORDER — CEFAZOLIN SODIUM-DEXTROSE 2-3 GM-% IV SOLR
2.0000 g | INTRAVENOUS | Status: AC
  Administered 2012-04-09: 2 g via INTRAVENOUS

## 2012-04-09 MED ORDER — FENTANYL CITRATE 0.05 MG/ML IJ SOLN
INTRAMUSCULAR | Status: DC | PRN
  Administered 2012-04-09: 25 ug via INTRAVENOUS
  Administered 2012-04-09: 50 ug via INTRAVENOUS
  Administered 2012-04-09: 25 ug via INTRAVENOUS

## 2012-04-09 MED ORDER — PROMETHAZINE HCL 25 MG/ML IJ SOLN
6.2500 mg | INTRAMUSCULAR | Status: DC | PRN
  Administered 2012-04-09: 6.25 mg via INTRAVENOUS

## 2012-04-09 MED ORDER — HYDROMORPHONE HCL PF 1 MG/ML IJ SOLN
0.2500 mg | INTRAMUSCULAR | Status: DC | PRN
  Administered 2012-04-09 (×2): 0.5 mg via INTRAVENOUS

## 2012-04-09 MED ORDER — DEXAMETHASONE SODIUM PHOSPHATE 4 MG/ML IJ SOLN
INTRAMUSCULAR | Status: DC | PRN
  Administered 2012-04-09: 10 mg via INTRAVENOUS

## 2012-04-09 MED ORDER — PROPOFOL 10 MG/ML IV BOLUS
INTRAVENOUS | Status: DC | PRN
  Administered 2012-04-09: 180 mg via INTRAVENOUS

## 2012-04-09 MED ORDER — SCOPOLAMINE 1 MG/3DAYS TD PT72
1.0000 | MEDICATED_PATCH | Freq: Once | TRANSDERMAL | Status: DC
  Administered 2012-04-09: 1.5 mg via TRANSDERMAL

## 2012-04-09 SURGICAL SUPPLY — 55 items
ADH SKN CLS APL DERMABOND .7 (GAUZE/BANDAGES/DRESSINGS) ×2
APPLIER CLIP 11 MED OPEN (CLIP) ×2
APR CLP MED 11 20 MLT OPN (CLIP) ×1
BLADE SURG 10 STRL SS (BLADE) ×2 IMPLANT
BLADE SURG 15 STRL LF DISP TIS (BLADE) ×1 IMPLANT
BLADE SURG 15 STRL SS (BLADE) ×2
BLADE SURG ROTATE 9660 (MISCELLANEOUS) ×1 IMPLANT
CANISTER SUCTION 1200CC (MISCELLANEOUS) ×2 IMPLANT
CHLORAPREP W/TINT 26ML (MISCELLANEOUS) ×2 IMPLANT
CLIP APPLIE 11 MED OPEN (CLIP) IMPLANT
CLOTH BEACON ORANGE TIMEOUT ST (SAFETY) ×2 IMPLANT
COVER MAYO STAND STRL (DRAPES) ×2 IMPLANT
COVER PROBE W GEL 5X96 (DRAPES) ×2 IMPLANT
COVER TABLE BACK 60X90 (DRAPES) ×2 IMPLANT
DECANTER SPIKE VIAL GLASS SM (MISCELLANEOUS) ×2 IMPLANT
DERMABOND ADVANCED (GAUZE/BANDAGES/DRESSINGS) ×2
DERMABOND ADVANCED .7 DNX12 (GAUZE/BANDAGES/DRESSINGS) ×2 IMPLANT
DEVICE DUBIN W/COMP PLATE 8390 (MISCELLANEOUS) ×2 IMPLANT
DRAIN CHANNEL 19F RND (DRAIN) ×1 IMPLANT
DRAPE LAPAROSCOPIC ABDOMINAL (DRAPES) ×2 IMPLANT
DRAPE UTILITY XL STRL (DRAPES) ×2 IMPLANT
ELECT COATED BLADE 2.86 ST (ELECTRODE) ×2 IMPLANT
ELECT REM PT RETURN 9FT ADLT (ELECTROSURGICAL) ×2
ELECTRODE REM PT RTRN 9FT ADLT (ELECTROSURGICAL) ×1 IMPLANT
EVACUATOR SILICONE 100CC (DRAIN) ×1 IMPLANT
GLOVE BIO SURGEON STRL SZ7 (GLOVE) ×1 IMPLANT
GLOVE BIO SURGEON STRL SZ7.5 (GLOVE) ×2 IMPLANT
GLOVE BIOGEL PI IND STRL 7.0 (GLOVE) IMPLANT
GLOVE BIOGEL PI INDICATOR 7.0 (GLOVE) ×1
GOWN PREVENTION PLUS XLARGE (GOWN DISPOSABLE) ×2 IMPLANT
KIT MARKER MARGIN INK (KITS) ×1 IMPLANT
NDL HYPO 25X1 1.5 SAFETY (NEEDLE) ×2 IMPLANT
NDL SAFETY ECLIPSE 18X1.5 (NEEDLE) ×1 IMPLANT
NEEDLE HYPO 18GX1.5 SHARP (NEEDLE) ×2
NEEDLE HYPO 25X1 1.5 SAFETY (NEEDLE) ×4 IMPLANT
NS IRRIG 1000ML POUR BTL (IV SOLUTION) ×2 IMPLANT
PACK BASIN DAY SURGERY FS (CUSTOM PROCEDURE TRAY) ×2 IMPLANT
PENCIL BUTTON HOLSTER BLD 10FT (ELECTRODE) ×2 IMPLANT
PIN SAFETY STERILE (MISCELLANEOUS) ×1 IMPLANT
SLEEVE SCD COMPRESS KNEE MED (MISCELLANEOUS) ×2 IMPLANT
SPONGE LAP 18X18 X RAY DECT (DISPOSABLE) ×2 IMPLANT
SPONGE LAP 4X18 X RAY DECT (DISPOSABLE) ×1 IMPLANT
STAPLER VISISTAT 35W (STAPLE) IMPLANT
SUT ETHILON 3 0 FSL (SUTURE) ×1 IMPLANT
SUT MON AB 4-0 PC3 18 (SUTURE) ×4 IMPLANT
SUT SILK 3 0 PS 1 (SUTURE) IMPLANT
SUT VIC AB 3-0 54X BRD REEL (SUTURE) ×2 IMPLANT
SUT VIC AB 3-0 BRD 54 (SUTURE) ×4
SUT VICRYL 3-0 CR8 SH (SUTURE) ×6 IMPLANT
SYR CONTROL 10ML LL (SYRINGE) ×4 IMPLANT
TOWEL OR 17X24 6PK STRL BLUE (TOWEL DISPOSABLE) ×4 IMPLANT
TOWEL OR NON WOVEN STRL DISP B (DISPOSABLE) ×2 IMPLANT
TUBE CONNECTING 20X1/4 (TUBING) ×2 IMPLANT
WATER STERILE IRR 1000ML POUR (IV SOLUTION) ×2 IMPLANT
YANKAUER SUCT BULB TIP NO VENT (SUCTIONS) ×2 IMPLANT

## 2012-04-09 NOTE — Anesthesia Preprocedure Evaluation (Signed)
Anesthesia Evaluation  Patient identified by MRN, date of birth, ID band Patient awake    Reviewed: Allergy & Precautions, H&P , NPO status , Patient's Chart, lab work & pertinent test results  Airway Mallampati: I TM Distance: >3 FB Neck ROM: Full    Dental   Pulmonary  VC dysfunction breath sounds clear to auscultation        Cardiovascular hypertension, Rhythm:Regular Rate:Normal     Neuro/Psych    GI/Hepatic   Endo/Other    Renal/GU      Musculoskeletal   Abdominal   Peds  Hematology   Anesthesia Other Findings   Reproductive/Obstetrics                           Anesthesia Physical Anesthesia Plan  ASA: II  Anesthesia Plan: General   Post-op Pain Management:    Induction: Intravenous  Airway Management Planned: LMA  Additional Equipment:   Intra-op Plan:   Post-operative Plan: Extubation in OR  Informed Consent: I have reviewed the patients History and Physical, chart, labs and discussed the procedure including the risks, benefits and alternatives for the proposed anesthesia with the patient or authorized representative who has indicated his/her understanding and acceptance.     Plan Discussed with: CRNA and Surgeon  Anesthesia Plan Comments:         Anesthesia Quick Evaluation

## 2012-04-09 NOTE — H&P (View-Only) (Signed)
Subjective:     Patient ID: Brooke Wood, female   DOB: 07/02/1965, 46 y.o.   MRN: [id number]  HPI We are asked to the patient in consultation by Dr. Jackson to evaluate her for a right-sided breast cancer. The patient is a 46-year-old black female who first noticed a lump in the upper outer right breast about a month ago. Her last mammogram was in January. She denies any breast pain or discharge from her nipple. She quit taking her birth control pills today. The mass was biopsied and came back as a lobular breast cancer. She was ER and PR positive and HER-2 negative. Her Ki-67 was 20%. Her ultrasound and MRI estimated the size to be 2.9 cm.  Review of Systems  Constitutional: Negative.   HENT: Negative.   Eyes: Negative.   Respiratory: Negative.   Cardiovascular: Negative.   Gastrointestinal: Negative.   Genitourinary: Negative.   Musculoskeletal: Negative.   Skin: Negative.   Neurological: Negative.   Hematological: Negative.   Psychiatric/Behavioral: Negative.        Objective:   Physical Exam  Constitutional: She is oriented to person, place, and time. She appears well-developed and well-nourished.  HENT:  Head: Normocephalic and atraumatic.  Eyes: Conjunctivae normal and EOM are normal. Pupils are equal, round, and reactive to light.  Neck: Normal range of motion. Neck supple.  Cardiovascular: Normal rate, regular rhythm and normal heart sounds.   Pulmonary/Chest: Effort normal and breath sounds normal.       There is a palpable mass in the upper outer quadrant measuring about 3 cm of the right breast. There is no palpable mass in the left breast. There is no palpable axillary supraclavicular cervical lymphadenopathy  Abdominal: Soft. Bowel sounds are normal. She exhibits no mass. There is no tenderness.  Musculoskeletal: Normal range of motion.  Lymphadenopathy:    She has no cervical adenopathy.  Neurological: She is alert and oriented to person, place, and time.  Skin:  Skin is warm and dry.  Psychiatric: She has a normal mood and affect. Her behavior is normal.       Assessment:     The patient has a 2.9 cm lobular cancer in the upper outer right breast. Because she has a large breast I think she would still be a candidate for breast conservation. I've discussed with her in detail the different options for surgical treatment of this cancer and she favors breast conservation. I've also talk to her about sentinel node mapping which is also good candidate for. I discussed with her in detail the risks and benefits of the operation as well as some of the technical aspects and she understands and wishes to proceed    Plan:     Plan for right breast lumpectomy and sentinel node mapping      

## 2012-04-09 NOTE — Interval H&P Note (Signed)
History and Physical Interval Note:  04/09/2012 7:12 AM  Brooke Wood  has presented today for surgery, with the diagnosis of right breast cancer  The various methods of treatment have been discussed with the patient and family. After consideration of risks, benefits and other options for treatment, the patient has consented to  Procedure(s) (LRB) with comments: BREAST LUMPECTOMY WITH SENTINEL LYMPH NODE BX (Right) as a surgical intervention .  The patient's history has been reviewed, patient examined, no change in status, stable for surgery.  I have reviewed the patient's chart and labs.  Questions were answered to the patient's satisfaction.     TOTH III,Iaan Oregel S

## 2012-04-09 NOTE — Transfer of Care (Signed)
Immediate Anesthesia Transfer of Care Note  Patient: Brooke Wood  Procedure(s) Performed: Procedure(s) (LRB) with comments: BREAST LUMPECTOMY WITH SENTINEL LYMPH NODE BX (Right)  Patient Location: PACU  Anesthesia Type:General  Level of Consciousness: sedated  Airway & Oxygen Therapy: Patient Spontanous Breathing and Patient connected to face mask oxygen  Post-op Assessment: Report given to PACU RN and Post -op Vital signs reviewed and stable  Post vital signs: Reviewed and stable  Complications: No apparent anesthesia complications

## 2012-04-09 NOTE — Anesthesia Postprocedure Evaluation (Signed)
  Anesthesia Post-op Note  Patient: Brooke Wood  Procedure(s) Performed: Procedure(s) (LRB) with comments: BREAST LUMPECTOMY WITH SENTINEL LYMPH NODE BX (Right)  Patient Location: PACU  Anesthesia Type:General  Level of Consciousness: awake and alert   Airway and Oxygen Therapy: Patient Spontanous Breathing  Post-op Pain: mild  Post-op Assessment: Post-op Vital signs reviewed, Patient's Cardiovascular Status Stable, Respiratory Function Stable, Patent Airway, No signs of Nausea or vomiting, Adequate PO intake and Pain level controlled  Post-op Vital Signs: Reviewed and stable  Complications: No apparent anesthesia complications

## 2012-04-09 NOTE — Progress Notes (Addendum)
Notified Dr. Gypsy Balsam that pt vomited 50cc's, prior to this episode pt asleep. Pt will not answer when you ask a question - explained to Dr. Gypsy Balsam and he said other family acted the same. Told him she is very sleepy and feel like something is odd with her- he agreed. Did not want to give any anti- nausea medications. Dr. Gypsy Balsam said to go ahead and move her to phase 2.

## 2012-04-09 NOTE — Anesthesia Procedure Notes (Signed)
Procedure Name: LMA Insertion Date/Time: 04/09/2012 7:35 AM Performed by: Burna Cash Pre-anesthesia Checklist: Patient identified, Emergency Drugs available, Suction available and Patient being monitored Patient Re-evaluated:Patient Re-evaluated prior to inductionOxygen Delivery Method: Circle System Utilized Preoxygenation: Pre-oxygenation with 100% oxygen Intubation Type: IV induction Ventilation: Mask ventilation without difficulty LMA: LMA inserted LMA Size: 4.0 Number of attempts: 1 Airway Equipment and Method: bite block Placement Confirmation: positive ETCO2 Tube secured with: Tape Dental Injury: Teeth and Oropharynx as per pre-operative assessment

## 2012-04-09 NOTE — Telephone Encounter (Signed)
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  Patient does not wish to pursue family studies.

## 2012-04-09 NOTE — Progress Notes (Signed)
Emotional support during breast injections °

## 2012-04-09 NOTE — Op Note (Signed)
04/09/2012  8:49 AM  PATIENT:  Brooke Wood  47 y.o. female  PRE-OPERATIVE DIAGNOSIS:  right breast cancer  POST-OPERATIVE DIAGNOSIS:  right breast cancer  PROCEDURE:  Procedure(s) (LRB) with comments: BREAST LUMPECTOMY WITH SENTINEL LYMPH NODE BX (Right)  SURGEON:  Surgeon(s) and Role:    * Robyne Askew, MD - Primary  PHYSICIAN ASSISTANT:   ASSISTANTS: none   ANESTHESIA:   general  EBL:  Total I/O In: 200 [I.V.:200] Out: -   BLOOD ADMINISTERED:none  DRAINS: (1) Jackson-Pratt drain(s) with closed bulb suction in the axilla   LOCAL MEDICATIONS USED:  MARCAINE     SPECIMEN:  Source of Specimen:  right breast tissue and sentinel node with extra inferior margin  DISPOSITION OF SPECIMEN:  PATHOLOGY  COUNTS:  YES  TOURNIQUET:  * No tourniquets in log *  DICTATION: .Dragon Dictation After informed consent was obtained the patient was brought to the operating room and placed in the supine position on the operating room table. After adequate induction of general anesthesia the patient's right chest, breast, and axilla were prepped with ChloraPrep, allowed to dry, and draped in usual sterile manner. Earlier in the day the patient underwent injection of 1 mCi of technetium sulfur colloid in the subareolar position on the right. At this point, 2 cc of methylene blue 3 cc of injectable saline were also injected in the subareolar position on the right. The breast was massaged for several minutes. The patient has a palpable mass in the upper outer quadrant of the right breast. An elliptical skin incision was made overlying the palpable mass in the right breast with a 15 blade knife. This incision was carried through the skin and subcutaneous tissue sharply with electrocautery. Since the mass was palpable we were able to dissected in the breast tissue widely around the palpable mass. This was all done sharply with the electrocautery. The dissection was carried all the way to the chest  wall. Once the specimen was removed it was oriented according to the assigned paint colors. On palpating the specimen it did appear as though we may be close on the inferior margin. Additional inferior margin was removed sharply with the electrocautery and marked with a stitch on the New true surgical margin. Because the mass was very close to the axilla we were able to do the sentinel node mapping through the same incision. The neoprobe device was used to identify a hot spot in the right axilla. We were able to open axilla sharply with the electrocautery. We then used the neoprobe to direct blunt dissection until we were able to find a hot blue lymph node. This lymph node was excised by combination of sharp and Bovie dissection and then the lymphatics were controlled with clips. Once the lymph node was removed ex vivo counts on this lymph node measured approximately 3100. This was sent as sentinel node #1 to pathology. Hemostasis was achieved using the Bovie electrocautery. The axilla was then closed with interrupted 3-0 Vicryl stitches. The wound was infiltrated with quarter percent Marcaine and irrigated with copious amounts of saline. A small stab incision was made in the mid axillary line inferior to the operative bed with a 15 blade knife. The tonsil clamp was placed through this opening into the operative bed and used to bring a 19 [place] round Blake drain into the operative bed. The drain was anchored to the skin with a 3-0 nylon stitch. The deep layer of the incision was then closed with  interrupted 3-0 Vicryl stitches. The skin was then closed with interrupted 4-0 monocryl subcuticular stitches. Dermabond dressings were applied. The drain was placed to bulb suction there was a good seal. The patient tolerated the procedure well. At the end of the case all needle sponge and instrument counts were correct. The patient was then awakened and taken to recovery in stable condition.  PLAN OF CARE: Discharge to  home after PACU  PATIENT DISPOSITION:  PACU - hemodynamically stable.   Delay start of Pharmacological VTE agent (>24hrs) due to surgical blood loss or risk of bleeding: not applicable

## 2012-04-10 ENCOUNTER — Encounter (HOSPITAL_BASED_OUTPATIENT_CLINIC_OR_DEPARTMENT_OTHER): Payer: Self-pay | Admitting: General Surgery

## 2012-04-13 ENCOUNTER — Ambulatory Visit: Payer: Self-pay | Admitting: Oncology

## 2012-04-21 ENCOUNTER — Ambulatory Visit (INDEPENDENT_AMBULATORY_CARE_PROVIDER_SITE_OTHER): Payer: PRIVATE HEALTH INSURANCE | Admitting: General Surgery

## 2012-04-21 ENCOUNTER — Encounter (INDEPENDENT_AMBULATORY_CARE_PROVIDER_SITE_OTHER): Payer: Self-pay | Admitting: General Surgery

## 2012-04-21 VITALS — BP 140/90 | HR 80 | Temp 97.9°F | Resp 18 | Ht 65.0 in | Wt 156.0 lb

## 2012-04-21 DIAGNOSIS — C50419 Malignant neoplasm of upper-outer quadrant of unspecified female breast: Secondary | ICD-10-CM

## 2012-04-21 NOTE — Progress Notes (Signed)
Subjective:     Patient ID: Brooke Wood, female   DOB: September 04, 1965, 47 y.o.   MRN: [id number]  HPI The patient is a 47 year old black female who is 12 days status post right lumpectomy and sentinel node mapping for a T2 N0 right breast cancer. She was ER and PR positive and HER-2 negative. She had isolated tumor cells in her sentinel node. Her margins are all clean. She denies any breast pain. Her drain has been putting out about 5-10 cc a day  Review of Systems     Objective:   Physical Exam On exam her right breast incision is healing nicely with no sign of infection. Her drain was removed without difficulty and she tolerated this well to    Assessment:     12 days status post right lumpectomy and negative sentinel node mapping    Plan:     At this point we will refer her to the medical and radiation oncologist. We will plan to see her back in about 3 weeks to look for any buildup of seromatous fluid.

## 2012-04-21 NOTE — Patient Instructions (Signed)
May shower Wednesday Keep appt with medical oncology

## 2012-04-29 ENCOUNTER — Other Ambulatory Visit (HOSPITAL_COMMUNITY): Payer: Self-pay

## 2012-04-29 ENCOUNTER — Ambulatory Visit: Admit: 2012-04-29 | Payer: Self-pay | Admitting: General Surgery

## 2012-04-29 SURGERY — BREAST LUMPECTOMY WITH EXCISION OF SENTINEL NODE
Anesthesia: General | Laterality: Right

## 2012-05-01 ENCOUNTER — Telehealth: Payer: Self-pay | Admitting: Oncology

## 2012-05-01 ENCOUNTER — Ambulatory Visit (HOSPITAL_BASED_OUTPATIENT_CLINIC_OR_DEPARTMENT_OTHER): Payer: Medicaid Other | Admitting: Oncology

## 2012-05-01 ENCOUNTER — Encounter: Payer: Self-pay | Admitting: Oncology

## 2012-05-01 VITALS — BP 135/93 | HR 90 | Temp 98.1°F | Resp 18 | Ht 65.0 in | Wt 155.1 lb

## 2012-05-01 DIAGNOSIS — N6311 Unspecified lump in the right breast, upper outer quadrant: Secondary | ICD-10-CM

## 2012-05-01 DIAGNOSIS — C50419 Malignant neoplasm of upper-outer quadrant of unspecified female breast: Secondary | ICD-10-CM

## 2012-05-01 DIAGNOSIS — Z170 Estrogen receptor positive status [ER+]: Secondary | ICD-10-CM

## 2012-05-01 NOTE — Progress Notes (Signed)
OFFICE PROGRESS NOTE  CC  [name], MD [address] [facility] [address] [place], [address] [place] [address] Dr. Chevis Pretty Dr. 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  PRIOR THERAPY:  #1 patient was originally seen in the multidisciplinary breast clinic on 03/11/2012 with new diagnosis of a palpable mass in the right breast measuring 2.9 cm on mammogram. Her MRI revealed the mass to be 2.7 cm. Core needle biopsy showed invasive lobular carcinoma ER positive PR positive HER-2/neu negative with a low Ki-67.  #2 she has now gone on to have a lumpectomy with sentinel lymph node biopsy. Her final pathology reveals a 2.8 cm invasive tubular lobular carcinoma grade 2 one sentinel node has isolated tumor cells. Tumor is ER +99% PR +4% HER-2/neu negative Ki-67 60%. Pathologic stage T2N(i).  #3 she will have Oncotype DX testing sent today and we will then decide type of adjuvant therapy. In the meantime I will refer her to Dr. Antony Blackbird for reconsultation in radiation oncology.  CURRENT THERAPY: Refer to radiation oncology  INTERVAL HISTORY: Brooke Wood 47 y.o. female returns for followup visit post lumpectomy. Postoperatively she is doing well without any complaints except for pain at the surgical site. She and I discussed her final pathology results. She has no nausea vomiting no fevers chills night sweats no abdominal pain.  MEDICAL HISTORY: Past Medical History  Diagnosis Date  . Allergy     Neg RAST 2007  . Hypertension   . Vocal cord dysfunction     Neg Methacholine challenge test 2007  . Breast cancer     ALLERGIES:  is allergic to aspirin; codeine; and codeine phosphate.  MEDICATIONS:  Current Outpatient Prescriptions  Medication Sig Dispense Refill  . valsartan-hydrochlorothiazide (DIOVAN-HCT) 160-25 MG per tablet Take 1 tablet by  mouth daily.        SURGICAL HISTORY:  Past Surgical History  Procedure Date  . Breast surgery 1985    rt br bx  . Breast lumpectomy with sentinel lymph node biopsy 04/09/2012    Procedure: BREAST LUMPECTOMY WITH SENTINEL LYMPH NODE BX;  Surgeon: Robyne Askew, MD;  Location: [facility];  Service: General;  Laterality: Right;    REVIEW OF SYSTEMS:  Pertinent items are noted in HPI.   HEALTH MAINTENANCE:   PHYSICAL EXAMINATION: Blood pressure 135/93, pulse 90, temperature 98.1 F (36.7 C), temperature source Oral, resp. rate 18, height 5\' 5"  (1.651 m), weight 155 lb 1 oz (70.336 kg). Body mass index is 25.80 kg/(m^2). ECOG PERFORMANCE STATUS: 0 - Asymptomatic   General appearance: alert, cooperative and appears stated age Lymph nodes: Cervical, supraclavicular, and axillary nodes normal. Resp: clear to auscultation bilaterally Back: symmetric, no curvature. ROM normal. No CVA tenderness. Cardio: regular rate and rhythm GI: soft, non-tender; bowel sounds normal; no masses,  no organomegaly Extremities: extremities normal, atraumatic, no cyanosis or edema Neurologic: Grossly normal   LABORATORY DATA: Lab Results  Component Value Date   WBC 7.2 03/11/2012   HGB 14.6 04/09/2012   HCT 43.0 04/09/2012   MCV 89.6 03/11/2012   PLT 277 03/11/2012      Chemistry      Component Value Date/Time   NA 139 04/09/2012 0705   NA 141 03/11/2012 1211   K 4.7 04/09/2012 0705   K 3.8 03/11/2012 1211   CL 98 04/09/2012 0705  CL 106 03/11/2012 1211   CO2 28 03/11/2012 1211   CO2 27 07/19/2010 1022   BUN 18 04/09/2012 0705   BUN 10.0 03/11/2012 1211   CREATININE 0.80 04/09/2012 0705   CREATININE 0.8 03/11/2012 1211      Component Value Date/Time   CALCIUM 9.7 03/11/2012 1211   CALCIUM 9.7 07/19/2010 1022   ALKPHOS 160* 03/11/2012 1211   AST 8 03/11/2012 1211   ALT 9 03/11/2012 1211   BILITOT 0.42 03/11/2012 1211     ADDITIONAL INFORMATION: 1. CHROMOGENIC IN-SITU  HYBRIDIZATION Interpretation HER-2/NEU BY CISH - NO AMPLIFICATION OF HER-2 DETECTED. THE RATIO OF HER-2: CEP 17 SIGNALS WAS 1.10. H. Hollice Espy MD Pathologist, Electronic Signature ( Signed 04/16/2012) FINAL DIAGNOSIS Diagnosis 1. Breast, lumpectomy, right - INVASIVE TUBULO-LOBULAR CARCINOMA, GRADE II/III, SPANNING 2.8 CM. 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 - THE SURGICAL RESECTION MARGINS ARE NEGATIVE FOR INVASIVE CARCINOMA. - SEE ONCOLOGY TABLE BELOW. 2. Lymph node, sentinel, biopsy, Right #1 - ISOLATED TUMOR CELL(S) IN 1 OF 1 LYMPH NODE. - SEE COMMENT. 3. Breast, excision, Right-new inferior margin - BENIGN BREAST PARENCHYMA. - THERE IS NO EVIDENCE OF MALIGNANCY. - SEE COMMENT. Microscopic Comment 1. BREAST, INVASIVE TUMOR, WITH LYMPH NODE SAMPLING Specimen, including laterality: Right breast 1 of 3 FINAL for Boye, Erick I ([id number]) Microscopic Comment(continued) Procedure: Lumpectomy Grade: II Tubule formation: 3 Nuclear pleomorphism: 2 Mitotic: 1 Tumor size (gross measurement): 2.8 cm Margins: Negative for carcinoma Invasive, distance to closest margin: 0.5 cm to the inferior margin of specimen #1 (gross measurement) Lymphovascular invasion: Not identified Ductal carcinoma in situ: Not identified Tumor focality: Unifocal Treatment effect: N/A Extent of tumor: Confined to breast parenchyma. Lymph nodes: # examined: 1 Lymph nodes with metastasis: 0 Isolated tumor cells (< 0.2 mm): 1 Micrometastasis: (> 0.2 mm and < 2.0 mm): 0 Macrometastasis: (> 2.0 mm): 0 Extracapsular extension: Not identified. Breast prognostic profile: Case [id number] Estrogen receptor: 99%, strong staining intensity Progesterone receptor: 84%, strong staining intensity Her 2 neu: No amplification was detected. The ratio was 1.30. Her 2 neu by CISH will be repeated on the current case and the results reported separately. Ki-67: 16% Non-neoplastic breast:  Healing biopsy site TNM: pT2, pN0 (i+) Comments: Immunohistochemical stains performed on the lymph node reveal cytokeratin positive isolated tumor cell(s). Most of the invasive cells are negative for E-Cadherin, supporting a lobular phenotype. However, within the 2.8 cm tumor are distinct areas of phenotypically dissimilar invasive carcinoma cells consisting of well formed glands. These components are strongly positive for E-Cadherin, supporting a component of ductal carcinoma. Overall, I believe this invasive tumor is best phenotyped as an invasive tubulo-lobular carcinoma. 3. The surgical resection margin(s) of the specimen were inked and microscopically evaluated.  RADIOGRAPHIC STUDIES:  Nm Sentinel Node Inj-no Rpt (breast)  04/09/2012  CLINICAL DATA: right breast cancer   Sulfur colloid was injected intradermally by the nuclear medicine  technologist for breast cancer sentinel node localization.      ASSESSMENT: 47 year old female with  #1 stage II (T2 N(i)) invasive tubular lobular carcinoma patient is status post lumpectomy in January 2014. She is doing well postop. We will send off an Oncotype DX testing on her. She and I discussed the rationale for this today. She will also be referred to radiation oncology   PLAN:   #1 Oncotype DX testing.  #2 refer back to radiation oncology.  #3 I will see her back in about 2-3 weeks' time.   All questions were answered.  The patient knows to call the clinic with any problems, questions or concerns. We can certainly see the patient much sooner if necessary.  I spent 30 minutes counseling the patient face to face. The total time spent in the appointment was 30 minutes.    Drue Second, MD Medical/Oncology [facility] [phone] (beeper) [phone] (Office)  05/01/2012, 12:47 PM

## 2012-05-01 NOTE — Telephone Encounter (Signed)
gv pt appt schedule for February. °

## 2012-05-01 NOTE — Patient Instructions (Addendum)
Schedule  With radiation oncology in follow up  Return to see me in 3 weeks  Oncotype dx testing

## 2012-05-04 ENCOUNTER — Encounter: Payer: Self-pay | Admitting: *Deleted

## 2012-05-04 NOTE — Progress Notes (Signed)
Ordered Oncotype Dx test w/ [facility].  Faxed request to path.

## 2012-05-06 ENCOUNTER — Telehealth (INDEPENDENT_AMBULATORY_CARE_PROVIDER_SITE_OTHER): Payer: Self-pay | Admitting: General Surgery

## 2012-05-06 NOTE — Telephone Encounter (Signed)
Patient called in wanting to know when she can stop putting on the band-aids that she has been using since her last visit with Dr. Carolynne Edouard. Patient confirmed that she does not have any signs of infection or discharge from the site. Patient stated that she did not know when to stop applying them. Advise the patient as long as the incision site is completely closed and there is no discharge she does not have to keep applying them. Patient agreed.

## 2012-05-08 ENCOUNTER — Encounter: Payer: Self-pay | Admitting: Genetic Counselor

## 2012-05-12 ENCOUNTER — Encounter: Payer: Self-pay | Admitting: *Deleted

## 2012-05-12 NOTE — Progress Notes (Signed)
Clinical Social Worker received phone call from pt requesting information on transportation resources.  CSW reviewed available resources with pt,and pt was agreeable to a referral to [facility].  CSW made referral and [place] will contact pt once she is approved.  CSW encouraged pt to call with any additional questions or concerns.    Tamala Julian, MSW, LCSW Clinical Social Worker [facility] [phone]

## 2012-05-13 ENCOUNTER — Other Ambulatory Visit (INDEPENDENT_AMBULATORY_CARE_PROVIDER_SITE_OTHER): Payer: Self-pay

## 2012-05-13 ENCOUNTER — Encounter (INDEPENDENT_AMBULATORY_CARE_PROVIDER_SITE_OTHER): Payer: Self-pay | Admitting: General Surgery

## 2012-05-13 ENCOUNTER — Ambulatory Visit (INDEPENDENT_AMBULATORY_CARE_PROVIDER_SITE_OTHER): Payer: Medicaid Other | Admitting: General Surgery

## 2012-05-13 VITALS — BP 130/88 | HR 64 | Temp 97.8°F | Resp 14 | Ht 65.5 in | Wt 153.2 lb

## 2012-05-13 DIAGNOSIS — C50419 Malignant neoplasm of upper-outer quadrant of unspecified female breast: Secondary | ICD-10-CM

## 2012-05-13 NOTE — Patient Instructions (Signed)
Will refer to physical therapy 

## 2012-05-13 NOTE — Progress Notes (Signed)
Subjective:     Patient ID: Brooke Wood, female   DOB: [birth year], 47 y.o.   MRN: [id number]  HPI The patient is a 47 year old black female who is about one month status post right lumpectomy and negative sentinel node biopsy for a T2 N0 right breast cancer. She had her drain removed about 3 weeks ago and tolerated this well. She has some sensitivity and soreness in the axilla but otherwise seems to be doing well.  Review of Systems     Objective:   Physical Exam On exam her right breast incision is healing nicely with no sign of infection or significant seroma.    Assessment:     The patient is one month status post right lumpectomy for breast cancer     Plan:     At this point we will refer her to physical therapy to work on her range of motion. She will continue to followup with the medical and radiation oncologist. We will plan to see her back in about 3 months.

## 2012-05-15 ENCOUNTER — Encounter: Payer: Self-pay | Admitting: *Deleted

## 2012-05-15 NOTE — Progress Notes (Signed)
Received Oncotype Dx results of 10.  Gave copy to MD.  Rochele Pages copy to Med Rec to scan.

## 2012-05-18 ENCOUNTER — Ambulatory Visit: Payer: Self-pay | Admitting: Oncology

## 2012-05-20 ENCOUNTER — Ambulatory Visit (HOSPITAL_BASED_OUTPATIENT_CLINIC_OR_DEPARTMENT_OTHER): Payer: Medicaid Other | Admitting: Oncology

## 2012-05-20 ENCOUNTER — Encounter: Payer: Self-pay | Admitting: Radiation Oncology

## 2012-05-20 ENCOUNTER — Encounter: Payer: Self-pay | Admitting: Oncology

## 2012-05-20 ENCOUNTER — Telehealth: Payer: Self-pay | Admitting: Oncology

## 2012-05-20 ENCOUNTER — Encounter: Payer: Self-pay | Admitting: Medical Oncology

## 2012-05-20 VITALS — BP 146/95 | HR 98 | Temp 98.2°F | Resp 20 | Ht 65.5 in | Wt 156.8 lb

## 2012-05-20 DIAGNOSIS — C50419 Malignant neoplasm of upper-outer quadrant of unspecified female breast: Secondary | ICD-10-CM

## 2012-05-20 DIAGNOSIS — Z170 Estrogen receptor positive status [ER+]: Secondary | ICD-10-CM

## 2012-05-20 NOTE — Progress Notes (Signed)
Patient scheduled to see Dr. Roselind Messier 05/21/12

## 2012-05-20 NOTE — Progress Notes (Signed)
OFFICE PROGRESS NOTE  CC  Brooke Linger, MD [address] [facility] [address] [place], [address] [place] [address] Dr. Chevis Pretty Dr. 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  PRIOR THERAPY:  #1 patient was originally seen in the multidisciplinary breast clinic on 03/11/2012 with new diagnosis of a palpable mass in the right breast measuring 2.9 cm on mammogram. Her MRI revealed the mass to be 2.7 cm. Core needle biopsy showed invasive lobular carcinoma ER positive PR positive HER-2/neu negative with a low Ki-67.  #2 she has now gone on to have a lumpectomy with sentinel lymph node biopsy. Her final pathology reveals a 2.8 cm invasive tubular lobular carcinoma grade 2 one sentinel node has isolated tumor cells. Tumor is ER +99% PR +4% HER-2/neu negative Ki-67 60%. Pathologic stage T2N(i).  #3 patient had an Oncotype DX performed her score was 10 giving her a 7% risk of distance recurrence with tamoxifen for 5 years. I have discussed this in detail with her.  CURRENT THERAPY: Refer to radiation oncology  INTERVAL HISTORY: Brooke Wood 47 y.o. female returns for followup visit. Clinically patient seems to be doing well without any problems. She has no nausea vomiting fevers chills night sweats. Her right breast wound still is a little tender. Remainder of the 10 point review of systems is negative MEDICAL HISTORY: Past Medical History  Diagnosis Date  . Allergy     Neg RAST 2007  . Hypertension   . Vocal cord dysfunction     Neg Methacholine challenge test 2007  . Breast cancer     right breast    ALLERGIES:  is allergic to aspirin; codeine; and codeine phosphate.  MEDICATIONS:  Current Outpatient Prescriptions  Medication Sig Dispense Refill  . valsartan-hydrochlorothiazide (DIOVAN-HCT) 160-25 MG per tablet Take 1 tablet by mouth daily.        No current facility-administered medications for this visit.    SURGICAL HISTORY:  Past Surgical History  Procedure Laterality Date  . Breast surgery  1985    rt br bx  . Breast lumpectomy with sentinel lymph node biopsy  04/09/2012    Procedure: BREAST LUMPECTOMY WITH SENTINEL LYMPH NODE BX;  Surgeon: Robyne Askew, MD;  Location: [facility];  Service: General;  Laterality: Right;    REVIEW OF SYSTEMS:  Pertinent items are noted in HPI.   HEALTH MAINTENANCE:   PHYSICAL EXAMINATION: Blood pressure 146/95, pulse 98, temperature 98.2 F (36.8 C), temperature source Oral, resp. rate 20, height 5' 5.5" (1.664 m), weight 156 lb 12.8 oz (71.124 kg). Body mass index is 25.69 kg/(m^2). ECOG PERFORMANCE STATUS: 0 - Asymptomatic   General appearance: alert, cooperative and appears stated age Lymph nodes: Cervical, supraclavicular, and axillary nodes normal. Resp: clear to auscultation bilaterally Back: symmetric, no curvature. ROM normal. No CVA tenderness. Cardio: regular rate and rhythm GI: soft, non-tender; bowel sounds normal; no masses,  no organomegaly Extremities: extremities normal, atraumatic, no cyanosis or edema Neurologic: Grossly normal   LABORATORY DATA: Lab Results  Component Value Date   WBC 7.2 03/11/2012   HGB 14.6 04/09/2012   HCT 43.0 04/09/2012   MCV 89.6 03/11/2012   PLT 277 03/11/2012      Chemistry      Component Value Date/Time   NA 139 04/09/2012 0705   NA 141 03/11/2012 1211   K 4.7  04/09/2012 0705   K 3.8 03/11/2012 1211   CL 98 04/09/2012 0705   CL 106 03/11/2012 1211   CO2 28 03/11/2012 1211   CO2 27 07/19/2010 1022   BUN 18 04/09/2012 0705   BUN 10.0 03/11/2012 1211   CREATININE 0.80 04/09/2012 0705   CREATININE 0.8 03/11/2012 1211      Component Value Date/Time   CALCIUM 9.7 03/11/2012 1211   CALCIUM 9.7 07/19/2010 1022   ALKPHOS 160* 03/11/2012 1211   AST 8 03/11/2012 1211   ALT 9 03/11/2012 1211   BILITOT 0.42 03/11/2012 1211      ADDITIONAL INFORMATION: 1. CHROMOGENIC IN-SITU HYBRIDIZATION Interpretation HER-2/NEU BY CISH - NO AMPLIFICATION OF HER-2 DETECTED. THE RATIO OF HER-2: CEP 17 SIGNALS WAS 1.10. H. Hollice Espy MD Pathologist, Electronic Signature ( Signed 04/16/2012) FINAL DIAGNOSIS Diagnosis 1. Breast, lumpectomy, right - INVASIVE TUBULO-LOBULAR CARCINOMA, GRADE II/III, SPANNING 2.8 CM. 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 - THE SURGICAL RESECTION MARGINS ARE NEGATIVE FOR INVASIVE CARCINOMA. - SEE ONCOLOGY TABLE BELOW. 2. Lymph node, sentinel, biopsy, Right #1 - ISOLATED TUMOR CELL(S) IN 1 OF 1 LYMPH NODE. - SEE COMMENT. 3. Breast, excision, Right-new inferior margin - BENIGN BREAST PARENCHYMA. - THERE IS NO EVIDENCE OF MALIGNANCY. - SEE COMMENT. Microscopic Comment 1. BREAST, INVASIVE TUMOR, WITH LYMPH NODE SAMPLING Specimen, including laterality: Right breast 1 of 3 FINAL for Wood, Brooke I ([id number]) Microscopic Comment(continued) Procedure: Lumpectomy Grade: II Tubule formation: 3 Nuclear pleomorphism: 2 Mitotic: 1 Tumor size (gross measurement): 2.8 cm Margins: Negative for carcinoma Invasive, distance to closest margin: 0.5 cm to the inferior margin of specimen #1 (gross measurement) Lymphovascular invasion: Not identified Ductal carcinoma in situ: Not identified Tumor focality: Unifocal Treatment effect: N/A Extent of tumor: Confined to breast parenchyma. Lymph nodes: # examined: 1 Lymph nodes with metastasis: 0 Isolated tumor cells (< 0.2 mm): 1 Micrometastasis: (> 0.2 mm and < 2.0 mm): 0 Macrometastasis: (> 2.0 mm): 0 Extracapsular extension: Not identified. Breast prognostic profile: Case [id number] Estrogen receptor: 99%, strong staining intensity Progesterone receptor: 84%, strong staining intensity Her 2 neu: No amplification was detected. The ratio was 1.30. Her 2 neu by CISH will be repeated on the current case and the results  reported separately. Ki-67: 16% Non-neoplastic breast: Healing biopsy site TNM: pT2, pN0 (i+) Comments: Immunohistochemical stains performed on the lymph node reveal cytokeratin positive isolated tumor cell(s). Most of the invasive cells are negative for E-Cadherin, supporting a lobular phenotype. However, within the 2.8 cm tumor are distinct areas of phenotypically dissimilar invasive carcinoma cells consisting of well formed glands. These components are strongly positive for E-Cadherin, supporting a component of ductal carcinoma. Overall, I believe this invasive tumor is best phenotyped as an invasive tubulo-lobular carcinoma. 3. The surgical resection margin(s) of the specimen were inked and microscopically evaluated.  RADIOGRAPHIC STUDIES:  Nm Sentinel Node Inj-no Rpt (breast)  04/09/2012  CLINICAL DATA: right breast cancer   Sulfur colloid was injected intradermally by the nuclear medicine  technologist for breast cancer sentinel node localization.      ASSESSMENT: 47 year old female with  #1 stage II (T2 N(i)) invasive tubular lobular carcinoma patient is status post lumpectomy in January 2014. She is doing well postop. We will send off an Oncotype DX testing on her. She and I discussed the rationale for this today. She will also be referred to radiation oncology  #2 patient had an Oncotype DX testing her breast cancer recurrence score is 10 giving her about  7% risk of distance recurrence with tamoxifen for 5 years. I have discussed this with the patient. Plan will be eventually to put her on tamoxifen since she is premenopausal. There is new data regarding use of tamoxifen for 10 years we also discussed this.  #3 however patient will be sent to radiation oncology for radiation first. I have recommended this to the patient.   PLAN:   #1 patient will proceed with radiation oncology consultation and treatment.  #2 I will see her back in about 2-3 months time  #3 patient is also  asking for Social Security disability. I will refer her to our social worker regarding these issues  All questions were answered. The patient knows to call the clinic with any problems, questions or concerns. We can certainly see the patient much sooner if necessary.  I spent 15 minutes counseling the patient face to face. The total time spent in the appointment was 30 minutes.    Drue Second, MD Medical/Oncology [facility] [phone] (beeper) [phone] (Office)  05/20/2012, 1:09 PM

## 2012-05-20 NOTE — Patient Instructions (Addendum)
Proceed with radiation therapy first  We will begin anti-estrogen therapy  (tamoxifen ) after completion of radiation

## 2012-05-20 NOTE — Progress Notes (Signed)
Per MD, referred patient to clinical SW, Tamala Julian for social security disability.

## 2012-05-20 NOTE — Progress Notes (Signed)
47 year old. Black female.   Stage II invasive tubular lobular carcinoma of the right breast. S/P lumpectomy with sentinel lymph node biopsy. ER,PR positive and HER2 negative.  Seen by [facility] on 03/11/2012. Medical oncologist is Welton Flakes. Surgeon:Toth.   Oncotype testing sent 05/01/2012  Ax: aspirin, codeine, codeine phosphate No hx of radiation therapy No indication of a pacemaker

## 2012-05-20 NOTE — Telephone Encounter (Signed)
gve the pt her April 2014 appt calendar °

## 2012-05-21 ENCOUNTER — Ambulatory Visit
Admission: RE | Admit: 2012-05-21 | Discharge: 2012-05-21 | Disposition: A | Discharge status: Home / Self Care | Payer: Medicaid Other | Source: Ambulatory Visit | Attending: Radiation Oncology | Admitting: Radiation Oncology

## 2012-05-21 ENCOUNTER — Encounter: Payer: Self-pay | Admitting: Radiation Oncology

## 2012-05-21 VITALS — BP 171/92 | HR 75 | Temp 98.6°F | Resp 18 | Ht 65.0 in | Wt 156.1 lb

## 2012-05-21 DIAGNOSIS — C50919 Malignant neoplasm of unspecified site of unspecified female breast: Secondary | ICD-10-CM | POA: Insufficient documentation

## 2012-05-21 DIAGNOSIS — C50419 Malignant neoplasm of upper-outer quadrant of unspecified female breast: Secondary | ICD-10-CM

## 2012-05-21 MED ORDER — ALRA NON-METALLIC DEODORANT (RAD-ONC)
1.0000 "application " | Freq: Once | TOPICAL | Status: AC
  Administered 2012-05-21: 1 via TOPICAL

## 2012-05-21 NOTE — Progress Notes (Signed)
Patient presents to the clinic today accompanied by her best friend, Brooke Wood, for consultation with Dr. Roselind Messier to discuss the role of radiation therapy in the treatment of right breast ca. Patient is alert and oriented to person, place, and time. No distress noted. Steady gait noted. Pleasant affect noted. Patient denies pain at this time. Limited ROM of right arm. Patient begin PT 05/26/2012. Patient reports discomfort along posterior upper arm when attempting to raise the right arm. No edema of the hands noted. Right breast surgical incision well approximated without redness, edema, or warmth. Patient denies nausea, vomiting, headache, dizziness, or diarrhea. Patient reports hot flashes. Patient denies unintentional weight loss. Patient is a part time employee at [employer] but, is taking leave at the moment. Patient lives alone. Reported all findings to Dr. Roselind Messier.

## 2012-05-21 NOTE — Progress Notes (Signed)
See progress note under physician encounter. 

## 2012-05-21 NOTE — Progress Notes (Signed)
Complete PATIENT MEASURE OF DISTRESS worksheet with a score of 7 submitted to social work.  

## 2012-05-21 NOTE — Progress Notes (Signed)
  Radiation Oncology         [phone] ________________________________  Name: LEIANNA BARGA MRN: [id number]  Date: 05/21/2012  DOB: 1966/01/30  Reevaluation note  CC: [name], MD  Victorino December, MD, Chevis Pretty, MD  Diagnosis:   Stage II-a invasive tubulo- lobular carcinoma of the right breast  Narrative:  The patient returns today for  for further evaluation. The patient was initially seen in the multidisciplinary breast clinic on 03/11/2012. Since that time the patient has undergone her definitive surgery with a lumpectomy and sentinel node procedure. In light of the location of her lesion, the sentinel node procedure and lumpectomy was performed through 1 incision.  Upon pathologic review the patient was found to have an invasive tubulo--lobular carcinoma, grade 2,  spanning over a 2.8 cm. The surgical margins were clear with the closest margin being 0.5 cm on the initial specimen however additional tissue was taken from the inferior area clearing this margin. Patient had one benign lymph node removed from the right axilla as part of her sentinel node procedure.                           ALLERGIES:  is allergic to aspirin; codeine; and codeine phosphate.  Meds: Current Outpatient Prescriptions  Medication Sig Dispense Refill  . valsartan-hydrochlorothiazide (DIOVAN-HCT) 160-25 MG per tablet Take 1 tablet by mouth daily.       No current facility-administered medications for this encounter.    Physical Findings: The patient is in no acute distress. Patient is alert and oriented.  height is 5\' 5"  (1.651 m) and weight is 156 lb 1.6 oz (70.806 kg). Her oral temperature is 98.6 F (37 C). Her blood pressure is 171/92 and her pulse is 75. Her respiration is 18 and oxygen saturation is 96%. .  No palpable supraclavicular or axillary adenopathy. The lungs are clear to auscultation. The heart has a regular rhythm and rate.  Examination of the left breast reveals no mass or nipple  discharge. Examination right breast reveals a well healing scar in the upper outer quadrant. There's no signs of drainage or infection in the breast. There is no dominant mass appreciated in the breast nipple discharge or bleeding.  Lab Findings: Lab Results  Component Value Date   WBC 7.2 03/11/2012   HGB 14.6 04/09/2012   HCT 43.0 04/09/2012   MCV 89.6 03/11/2012   PLT 277 03/11/2012    @LASTCHEM @  Radiographic Findings: No results found.  Impression:  Stage IIA tubulo-lobular carcinoma of the right breast. Patient would be an excellent candidate for breast conserving therapy with radiation therapy directed at the right breast area.  Plan:  Simulation and planning in 2 weeks. This should allow adequate time for patient to improve her right arm and shoulder mobility.  She in addition will be undergoing physical therapy to address this issue starting next week.  _____________________________________ -----------------------------------  Billie Lade, PhD, MD

## 2012-05-22 NOTE — Addendum Note (Signed)
Encounter addended by: Delynn Flavin, RN on: 05/22/2012  2:15 PM<BR>     Documentation filed: Charges VN

## 2012-05-22 NOTE — Addendum Note (Signed)
Encounter addended by: Delynn Flavin, RN on: 05/22/2012  2:14 PM<BR>     Documentation filed: Charges VN

## 2012-05-26 ENCOUNTER — Ambulatory Visit: Payer: No Typology Code available for payment source | Attending: General Surgery | Admitting: *Deleted

## 2012-05-26 DIAGNOSIS — C50919 Malignant neoplasm of unspecified site of unspecified female breast: Secondary | ICD-10-CM | POA: Insufficient documentation

## 2012-05-26 DIAGNOSIS — M25619 Stiffness of unspecified shoulder, not elsewhere classified: Secondary | ICD-10-CM | POA: Insufficient documentation

## 2012-05-26 DIAGNOSIS — R293 Abnormal posture: Secondary | ICD-10-CM | POA: Insufficient documentation

## 2012-05-26 DIAGNOSIS — IMO0001 Reserved for inherently not codable concepts without codable children: Secondary | ICD-10-CM | POA: Insufficient documentation

## 2012-05-29 ENCOUNTER — Ambulatory Visit: Payer: No Typology Code available for payment source | Admitting: Physical Therapy

## 2012-06-03 ENCOUNTER — Ambulatory Visit: Payer: Medicaid Other | Attending: General Surgery | Admitting: Physical Therapy

## 2012-06-03 DIAGNOSIS — R293 Abnormal posture: Secondary | ICD-10-CM | POA: Insufficient documentation

## 2012-06-03 DIAGNOSIS — M25619 Stiffness of unspecified shoulder, not elsewhere classified: Secondary | ICD-10-CM | POA: Insufficient documentation

## 2012-06-03 DIAGNOSIS — C50919 Malignant neoplasm of unspecified site of unspecified female breast: Secondary | ICD-10-CM | POA: Insufficient documentation

## 2012-06-03 DIAGNOSIS — IMO0001 Reserved for inherently not codable concepts without codable children: Secondary | ICD-10-CM | POA: Insufficient documentation

## 2012-06-04 ENCOUNTER — Ambulatory Visit
Admission: RE | Admit: 2012-06-04 | Discharge: 2012-06-04 | Disposition: A | Discharge status: Home / Self Care | Payer: Medicaid Other | Source: Ambulatory Visit | Attending: Radiation Oncology | Admitting: Radiation Oncology

## 2012-06-04 DIAGNOSIS — L299 Pruritus, unspecified: Secondary | ICD-10-CM | POA: Insufficient documentation

## 2012-06-04 DIAGNOSIS — L539 Erythematous condition, unspecified: Secondary | ICD-10-CM | POA: Insufficient documentation

## 2012-06-04 DIAGNOSIS — C50419 Malignant neoplasm of upper-outer quadrant of unspecified female breast: Secondary | ICD-10-CM | POA: Insufficient documentation

## 2012-06-04 DIAGNOSIS — R5381 Other malaise: Secondary | ICD-10-CM | POA: Insufficient documentation

## 2012-06-04 DIAGNOSIS — L819 Disorder of pigmentation, unspecified: Secondary | ICD-10-CM | POA: Insufficient documentation

## 2012-06-04 DIAGNOSIS — Z510 Encounter for antineoplastic radiation therapy: Secondary | ICD-10-CM | POA: Insufficient documentation

## 2012-06-04 DIAGNOSIS — C50411 Malignant neoplasm of upper-outer quadrant of right female breast: Secondary | ICD-10-CM

## 2012-06-07 NOTE — Progress Notes (Signed)
  Radiation Oncology         [phone] ________________________________  Name: Brooke Wood MRN: [id number]  Date: 06/04/2012  DOB: [birth year]  SIMULATION AND TREATMENT PLANNING NOTE  DIAGNOSIS:  Stage II-a invasive tubulo- lobular carcinoma of the right breast   NARRATIVE:  The patient was brought to the CT Simulation planning suite.  Identity was confirmed.  All relevant records and images related to the planned course of therapy were reviewed.  The patient freely provided informed written consent to proceed with treatment after reviewing the details related to the planned course of therapy. The consent form was witnessed and verified by the simulation staff.  Then, the patient was set-up in a stable reproducible  supine position for radiation therapy.  CT images were obtained.  Surface markings were placed.  The CT images were loaded into the planning software.  Then the target and avoidance structures were contoured.  Treatment planning then occurred.  The radiation prescription was entered and confirmed.  Then, I designed and supervised the construction of a total of 3 medically necessary complex treatment devices.  I have requested : Isodose Plan.  I have ordered:dose calc.  PLAN:  The patient will receive 50.4 Gy in 28 fractions followed by a boost to the upper outer quadrant to 60.4 Gy.  ________________________________    Billie Lade, PhD, MD

## 2012-06-09 ENCOUNTER — Ambulatory Visit: Payer: Medicaid Other

## 2012-06-10 ENCOUNTER — Ambulatory Visit: Payer: Medicaid Other | Admitting: *Deleted

## 2012-06-11 ENCOUNTER — Ambulatory Visit
Admission: RE | Admit: 2012-06-11 | Discharge: 2012-06-11 | Disposition: A | Discharge status: Home / Self Care | Payer: Medicaid Other | Source: Ambulatory Visit | Attending: Radiation Oncology | Admitting: Radiation Oncology

## 2012-06-11 DIAGNOSIS — C50411 Malignant neoplasm of upper-outer quadrant of right female breast: Secondary | ICD-10-CM

## 2012-06-11 NOTE — Progress Notes (Signed)
  Radiation Oncology         [phone] ________________________________  Name: Brooke Wood MRN: [id number]  Date: 06/11/2012  DOB: May 05, 1965  Simulation Verification Note  Status: outpatient  NARRATIVE: The patient was brought to the treatment unit and placed in the planned treatment position. The clinical setup was verified. Then port films were obtained and uploaded to the radiation oncology medical record software.  The treatment beams were carefully compared against the planned radiation fields. The position location and shape of the radiation fields was reviewed. They targeted volume of tissue appears to be appropriately covered by the radiation beams. Organs at risk appear to be excluded as planned.  Based on my personal review, I approved the simulation verification. The patient's treatment will proceed as planned.  -----------------------------------  Billie Lade, PhD, MD

## 2012-06-12 ENCOUNTER — Ambulatory Visit: Payer: Medicaid Other

## 2012-06-15 ENCOUNTER — Ambulatory Visit: Payer: Medicaid Other

## 2012-06-15 ENCOUNTER — Encounter: Payer: Self-pay | Admitting: Oncology

## 2012-06-15 ENCOUNTER — Ambulatory Visit
Admission: RE | Admit: 2012-06-15 | Discharge: 2012-06-15 | Disposition: A | Discharge status: Home / Self Care | Payer: Medicaid Other | Source: Ambulatory Visit | Attending: Radiation Oncology | Admitting: Radiation Oncology

## 2012-06-15 NOTE — Progress Notes (Signed)
Pt came in with a bill from [facility].  I faxed pt's financial assistance approval letter to them because they honor our discount.  Once received they will apply the discount.

## 2012-06-16 ENCOUNTER — Encounter: Payer: Self-pay | Admitting: Radiation Oncology

## 2012-06-16 ENCOUNTER — Ambulatory Visit
Admission: RE | Admit: 2012-06-16 | Discharge: 2012-06-16 | Disposition: A | Discharge status: Home / Self Care | Payer: Medicaid Other | Source: Ambulatory Visit | Attending: Radiation Oncology | Admitting: Radiation Oncology

## 2012-06-16 ENCOUNTER — Ambulatory Visit: Payer: Medicaid Other

## 2012-06-16 VITALS — BP 138/96 | HR 91 | Resp 16 | Wt 159.6 lb

## 2012-06-16 DIAGNOSIS — C50411 Malignant neoplasm of upper-outer quadrant of right female breast: Secondary | ICD-10-CM

## 2012-06-16 NOTE — Progress Notes (Signed)
[facility]    Radiation Oncology [address] [place]     Maryln Gottron, M.D. [place], [place] [address]               Billie Lade, M.D., Ph.D. Phone: [phone]      Molli Hazard A. Kathrynn Running, M.D. Fax: [phone]      Radene Gunning, M.D., Ph.D.         Lurline Hare, M.D.         Grayland Jack, M.D Weekly Treatment Management Note  Name: Brooke Wood     MRN: [id number]        CSN: [id number] Date: 06/16/2012      DOB: Feb 29, 1966  CC: [name], MD         [name]    Status: Outpatient  Diagnosis: The encounter diagnosis was Cancer of upper-outer quadrant of female breast, right.  Current Dose: 3.6 Gy  Current Fraction: 2  Planned Dose: 60.4 Gy  Narrative: Brooke Wood was seen today for weekly treatment management. The chart was checked and port films  were reviewed. She is tolerating treatments well this time without any side effects.  Aspirin; Codeine; and Codeine phosphate Current Outpatient Prescriptions  Medication Sig Dispense Refill  . non-metallic deodorant (ALRA) MISC Apply 1 application topically daily as needed.      . valsartan-hydrochlorothiazide (DIOVAN-HCT) 160-25 MG per tablet Take 1 tablet by mouth daily.      . Wound Cleansers (RADIAPLEX EX) Apply topically.       No current facility-administered medications for this encounter.   Labs:  Lab Results  Component Value Date   WBC 7.2 03/11/2012   HGB 14.6 04/09/2012   HCT 43.0 04/09/2012   MCV 89.6 03/11/2012   PLT 277 03/11/2012   Lab Results  Component Value Date   CREATININE 0.80 04/09/2012   BUN 18 04/09/2012   NA 139 04/09/2012   K 4.7 04/09/2012   CL 98 04/09/2012   CO2 28 03/11/2012   Lab Results  Component Value Date   ALT 9 03/11/2012   AST 8 03/11/2012   BILITOT 0.42 03/11/2012    Physical Examination:  weight is 159 lb 9.6 oz (72.394 kg). Her blood pressure is 138/96 and her pulse is 91. Her respiration is 16.    Wt Readings from Last 3 Encounters:  06/16/12 159 lb  9.6 oz (72.394 kg)  05/21/12 156 lb 1.6 oz (70.806 kg)  05/20/12 156 lb 12.8 oz (71.124 kg)    The right breast area is well healed without any significant radiation reaction at this time. Lungs - Normal respiratory effort, chest expands symmetrically. Lungs are clear to auscultation, no crackles or wheezes.  Heart has regular rhythm and rate  Abdomen is soft and non tender with normal bowel sounds  Assessment:  Patient tolerating treatments well  Plan: Continue treatment per original radiation prescription

## 2012-06-16 NOTE — Progress Notes (Addendum)
Patient presents to the clinic today unaccompanied for PUT with Dr. Roselind Messier. Patient alert and oriented to person, place, and time. No distress noted. Steady gait noted. Pleasant affect noted. Patient denies pain at this time. No skin changes to right breast noted. Patient denies fatigue. Diastolic bp elevated. Patient has yet to take bp medication yet today. Encouraged patient to take bp medication upon arriving home. Patient verbalized understanding. Reported all findings to Dr. Roselind Messier.

## 2012-06-17 ENCOUNTER — Ambulatory Visit: Payer: Medicaid Other

## 2012-06-17 ENCOUNTER — Ambulatory Visit
Admission: RE | Admit: 2012-06-17 | Discharge: 2012-06-17 | Disposition: A | Discharge status: Home / Self Care | Payer: Medicaid Other | Source: Ambulatory Visit | Attending: Radiation Oncology | Admitting: Radiation Oncology

## 2012-06-18 ENCOUNTER — Ambulatory Visit: Payer: Medicaid Other

## 2012-06-18 ENCOUNTER — Ambulatory Visit
Admission: RE | Admit: 2012-06-18 | Discharge: 2012-06-18 | Disposition: A | Discharge status: Home / Self Care | Payer: Medicaid Other | Source: Ambulatory Visit | Attending: Radiation Oncology | Admitting: Radiation Oncology

## 2012-06-19 ENCOUNTER — Ambulatory Visit
Admission: RE | Admit: 2012-06-19 | Discharge: 2012-06-19 | Disposition: A | Discharge status: Home / Self Care | Payer: Medicaid Other | Source: Ambulatory Visit | Attending: Radiation Oncology | Admitting: Radiation Oncology

## 2012-06-19 ENCOUNTER — Ambulatory Visit: Payer: Medicaid Other

## 2012-06-22 ENCOUNTER — Ambulatory Visit
Admission: RE | Admit: 2012-06-22 | Discharge: 2012-06-22 | Disposition: A | Discharge status: Home / Self Care | Payer: Medicaid Other | Source: Ambulatory Visit | Attending: Radiation Oncology | Admitting: Radiation Oncology

## 2012-06-22 ENCOUNTER — Ambulatory Visit: Payer: Medicaid Other

## 2012-06-22 ENCOUNTER — Encounter: Payer: Self-pay | Admitting: *Deleted

## 2012-06-22 NOTE — Progress Notes (Signed)
Clinical Child psychotherapist met with pt in office at [facility] to review SCAT application, and address issues with [employer] transportation.  CSW contacted [place] and confirmed all of pt's upcoming appointments.  CSW stressed the importance of pt being picked up on time, and its effects on her treatment outcomes.  CSW and pt also reviewed and completed the SCAT application, and CSW submitted application for review.  Pt will be contacted by SCAT to complet the application process.  Tamala Julian, MSW, LCSW Clinical Social Worker [facility] [phone]

## 2012-06-23 ENCOUNTER — Ambulatory Visit
Admission: RE | Admit: 2012-06-23 | Discharge: 2012-06-23 | Disposition: A | Discharge status: Home / Self Care | Payer: Medicaid Other | Source: Ambulatory Visit | Attending: Radiation Oncology | Admitting: Radiation Oncology

## 2012-06-23 ENCOUNTER — Encounter: Payer: Self-pay | Admitting: Radiation Oncology

## 2012-06-23 ENCOUNTER — Ambulatory Visit: Payer: Medicaid Other

## 2012-06-23 VITALS — BP 135/94 | HR 80 | Resp 18 | Wt 160.2 lb

## 2012-06-23 DIAGNOSIS — C50411 Malignant neoplasm of upper-outer quadrant of right female breast: Secondary | ICD-10-CM

## 2012-06-23 NOTE — Progress Notes (Signed)
[facility]    Radiation Oncology [address] [place]     Maryln Gottron, M.D. [place], [place] [address]               Billie Lade, M.D., Ph.D. Phone: [phone]      Molli Hazard A. Kathrynn Running, M.D. Fax: [phone]      Radene Gunning, M.D., Ph.D.         Lurline Hare, M.D.         Grayland Jack, M.D Weekly Treatment Management Note  Name: Brooke Wood     MRN: [id number]        CSN: [id number] Date: 06/23/2012      DOB: 01-May-1965  CC: [name], MD         [name]    Status: Outpatient  Diagnosis: The encounter diagnosis was Cancer of upper-outer quadrant of female breast, right.  Current Dose: 12.6 Gy  Current Fraction: 7  Planned Dose: 60.4 Gy  Narrative: Brooke Wood was seen today for weekly treatment management. The chart was checked and port films  were reviewed. She has noticed some fatigue as well as some mild sensitivity in the right breast area.  Aspirin; Codeine; and Codeine phosphate  Current Outpatient Prescriptions  Medication Sig Dispense Refill  . non-metallic deodorant (ALRA) MISC Apply 1 application topically daily as needed.      . valsartan-hydrochlorothiazide (DIOVAN-HCT) 160-25 MG per tablet Take 1 tablet by mouth daily.      . Wound Cleansers (RADIAPLEX EX) Apply topically.       No current facility-administered medications for this encounter.    Physical Examination:  weight is 160 lb 3.2 oz (72.666 kg). Her blood pressure is 135/94 and her pulse is 80. Her respiration is 18.    Wt Readings from Last 3 Encounters:  06/23/12 160 lb 3.2 oz (72.666 kg)  06/16/12 159 lb 9.6 oz (72.394 kg)  05/21/12 156 lb 1.6 oz (70.806 kg)    The right breast area shows mild erythema and hyperpigmentation changes Lungs - Normal respiratory effort, chest expands symmetrically. Lungs are clear to auscultation, no crackles or wheezes.  Heart has regular rhythm and rate  Abdomen is soft and non tender with normal bowel sounds  Assessment:   Patient tolerating treatments well  Plan: Continue treatment per original radiation prescription

## 2012-06-23 NOTE — Progress Notes (Signed)
Patient presents to the clinic today unaccompanied for PUT with Dr. Roselind Messier. Patient is alert and oriented to person, place, and time. No distress noted. Steady gait noted. Pleasant affect noted. Patient denies pain at this time. Patient reports that she is going to pick up her lymphedema sleeve today. Patient qualified for SCAT. Patient denies skin changes. Patient reports using radiaplex and alra as directed. Patient reports fatigue continues. Reported all findings to Dr. Roselind Messier.

## 2012-06-24 ENCOUNTER — Ambulatory Visit: Payer: Medicaid Other

## 2012-06-24 ENCOUNTER — Ambulatory Visit
Admission: RE | Admit: 2012-06-24 | Discharge: 2012-06-24 | Disposition: A | Discharge status: Home / Self Care | Payer: Medicaid Other | Source: Ambulatory Visit | Attending: Radiation Oncology | Admitting: Radiation Oncology

## 2012-06-25 ENCOUNTER — Ambulatory Visit: Payer: Medicaid Other

## 2012-06-25 ENCOUNTER — Ambulatory Visit
Admission: RE | Admit: 2012-06-25 | Discharge: 2012-06-25 | Disposition: A | Discharge status: Home / Self Care | Payer: Medicaid Other | Source: Ambulatory Visit | Attending: Radiation Oncology | Admitting: Radiation Oncology

## 2012-06-26 ENCOUNTER — Ambulatory Visit
Admission: RE | Admit: 2012-06-26 | Discharge: 2012-06-26 | Disposition: A | Discharge status: Home / Self Care | Payer: Medicaid Other | Source: Ambulatory Visit | Attending: Radiation Oncology | Admitting: Radiation Oncology

## 2012-06-26 ENCOUNTER — Ambulatory Visit: Payer: Medicaid Other

## 2012-06-29 ENCOUNTER — Ambulatory Visit
Admission: RE | Admit: 2012-06-29 | Discharge: 2012-06-29 | Disposition: A | Discharge status: Home / Self Care | Payer: Medicaid Other | Source: Ambulatory Visit | Attending: Radiation Oncology | Admitting: Radiation Oncology

## 2012-06-29 ENCOUNTER — Ambulatory Visit: Payer: Medicaid Other

## 2012-06-29 DIAGNOSIS — C50411 Malignant neoplasm of upper-outer quadrant of right female breast: Secondary | ICD-10-CM

## 2012-06-29 MED ORDER — RADIAPLEXRX EX GEL
Freq: Once | CUTANEOUS | Status: AC
  Administered 2012-06-29: 10:00:00 via TOPICAL

## 2012-06-30 ENCOUNTER — Encounter: Payer: Self-pay | Admitting: Radiation Oncology

## 2012-06-30 ENCOUNTER — Ambulatory Visit: Payer: Medicaid Other

## 2012-06-30 ENCOUNTER — Ambulatory Visit
Admission: RE | Admit: 2012-06-30 | Discharge: 2012-06-30 | Disposition: A | Discharge status: Home / Self Care | Payer: Medicaid Other | Source: Ambulatory Visit | Attending: Radiation Oncology | Admitting: Radiation Oncology

## 2012-06-30 ENCOUNTER — Ambulatory Visit: Admission: RE | Admit: 2012-06-30 | Payer: Medicaid Other | Source: Ambulatory Visit

## 2012-06-30 VITALS — BP 133/91 | HR 84 | Resp 18 | Wt 158.4 lb

## 2012-06-30 DIAGNOSIS — C50411 Malignant neoplasm of upper-outer quadrant of right female breast: Secondary | ICD-10-CM

## 2012-06-30 NOTE — Progress Notes (Signed)
[facility]    Radiation Oncology [address] [place]     Maryln Gottron, M.D. [place], [place] [address]               Billie Lade, M.D., Ph.D. Phone: [phone]      Molli Hazard A. Kathrynn Running, M.D. Fax: [phone]      Radene Gunning, M.D., Ph.D.         Lurline Hare, M.D.         Grayland Jack, M.D Weekly Treatment Management Note  Name: Brooke Wood     MRN: [id number]        CSN: [id number] Date: 06/30/2012      DOB: 08/10/1965  CC: [name], MD         [name]    Status: Outpatient  Diagnosis: The encounter diagnosis was Cancer of upper-outer quadrant of female breast, right.  Current Dose: 19.8 Gy  Current Fraction: 11  Planned Dose: 60.4 Gy  Narrative: [name] was seen today for weekly treatment management. The chart was checked and port films  were reviewed. She continues to tolerate her treatments well at any significant fatigue itching or discomfort in the breast area.  Aspirin; Codeine; and Codeine phosphate  Current Outpatient Prescriptions  Medication Sig Dispense Refill  . non-metallic deodorant (ALRA) MISC Apply 1 application topically daily as needed.      . valsartan-hydrochlorothiazide (DIOVAN-HCT) 160-25 MG per tablet Take 1 tablet by mouth daily.      . Wound Cleansers (RADIAPLEX EX) Apply topically.       No current facility-administered medications for this encounter.   Labs:    Physical Examination:  weight is 158 lb 6.4 oz (71.85 kg). Her blood pressure is 133/91 and her pulse is 84. Her respiration is 18.    Wt Readings from Last 3 Encounters:  06/30/12 158 lb 6.4 oz (71.85 kg)  06/23/12 160 lb 3.2 oz (72.666 kg)  06/16/12 159 lb 9.6 oz (72.394 kg)    The right breast area shows some hyperpigmentation changes and erythema. The skin is intact. Lungs - Normal respiratory effort, chest expands symmetrically. Lungs are clear to auscultation, no crackles or wheezes.  Heart has regular rhythm and rate  Abdomen is soft  and non tender with normal bowel sounds  Assessment:  Patient tolerating treatments well  Plan: Continue treatment per original radiation prescription

## 2012-06-30 NOTE — Progress Notes (Signed)
Patient presents to the clinic today unaccompanied for PUT with Dr. Roselind Messier prior to treatment. Patient alert and oriented to person, place, and time. No distress noted. Steady gait noted. Pleasant affect noted. Patient denies pain at this time. Only faint hyperpigmentation without desquamation of right/treated breast noted. Patient reports using radiaplex bid as directed. Patient denies fatigue. BP elevated. Patient denies taking bp medication. Reported all findings to Dr. Roselind Messier.

## 2012-07-01 ENCOUNTER — Ambulatory Visit
Admission: RE | Admit: 2012-07-01 | Discharge: 2012-07-01 | Disposition: A | Discharge status: Home / Self Care | Payer: Medicaid Other | Source: Ambulatory Visit | Attending: Radiation Oncology | Admitting: Radiation Oncology

## 2012-07-01 ENCOUNTER — Ambulatory Visit: Payer: Medicaid Other

## 2012-07-02 ENCOUNTER — Ambulatory Visit: Payer: Medicaid Other

## 2012-07-02 ENCOUNTER — Ambulatory Visit
Admission: RE | Admit: 2012-07-02 | Discharge: 2012-07-02 | Disposition: A | Discharge status: Home / Self Care | Payer: Medicaid Other | Source: Ambulatory Visit | Attending: Radiation Oncology | Admitting: Radiation Oncology

## 2012-07-03 ENCOUNTER — Ambulatory Visit
Admission: RE | Admit: 2012-07-03 | Discharge: 2012-07-03 | Disposition: A | Discharge status: Home / Self Care | Payer: Medicaid Other | Source: Ambulatory Visit | Attending: Radiation Oncology | Admitting: Radiation Oncology

## 2012-07-03 ENCOUNTER — Encounter (INDEPENDENT_AMBULATORY_CARE_PROVIDER_SITE_OTHER): Payer: Medicaid Other | Admitting: General Surgery

## 2012-07-03 ENCOUNTER — Ambulatory Visit: Payer: Medicaid Other

## 2012-07-06 ENCOUNTER — Ambulatory Visit
Admission: RE | Admit: 2012-07-06 | Discharge: 2012-07-06 | Disposition: A | Discharge status: Home / Self Care | Payer: Medicaid Other | Source: Ambulatory Visit | Attending: Radiation Oncology | Admitting: Radiation Oncology

## 2012-07-06 ENCOUNTER — Ambulatory Visit: Payer: Medicaid Other

## 2012-07-06 VITALS — BP 155/104 | HR 86 | Temp 98.0°F | Ht 65.0 in | Wt 159.7 lb

## 2012-07-06 DIAGNOSIS — C50411 Malignant neoplasm of upper-outer quadrant of right female breast: Secondary | ICD-10-CM

## 2012-07-06 NOTE — Progress Notes (Addendum)
Brooke Wood here for under treatment visit.  She has had 16/28 fractions to her right breast.  Her bp today was 154/112 and 155/104. She has not taken her Diovan-Hct yet and is going to take one now.  She denies pain.  She is having a little fatigue.  The skin on her right breast is intact with some hyperpigmentation.

## 2012-07-06 NOTE — Progress Notes (Signed)
[facility]    Radiation Oncology [address] [place]     Maryln Gottron, M.D. [place], [place] [address]               Billie Lade, M.D., Ph.D. Phone: [phone]      Molli Hazard A. Kathrynn Running, M.D. Fax: [phone]      Radene Gunning, M.D., Ph.D.         Lurline Hare, M.D.         Grayland Jack, M.D Weekly Treatment Management Note  Name: Brooke Wood     MRN: [id number]        CSN: [id number] Date: 07/06/2012      DOB: [birth year]  CC: [name], MD         [name]    Status: Outpatient  Diagnosis: The encounter diagnosis was Cancer of upper-outer quadrant of female breast, right.  Current Dose: 28.8 Gy  Current Fraction: 16  Planned Dose: 60.4 Gy  Narrative: Brooke Wood was seen today for weekly treatment management. The chart was checked and port films  were reviewed. She has some mild discomfort in the breast near the axillary region.  she does admit that her appetite has increased with her radiation therapy. She denies any fatigue at this time.  Aspirin; Codeine; and Codeine phosphate  Current Outpatient Prescriptions  Medication Sig Dispense Refill  . non-metallic deodorant (ALRA) MISC Apply 1 application topically daily as needed.      . valsartan-hydrochlorothiazide (DIOVAN-HCT) 160-25 MG per tablet Take 1 tablet by mouth daily.      . Wound Cleansers (RADIAPLEX EX) Apply topically.       No current facility-administered medications for this encounter.      Physical Examination:  height is 5\' 5"  (1.651 m) and weight is 159 lb 11.2 oz (72.439 kg). Her temperature is 98 F (36.7 C). Her blood pressure is 155/104 and her pulse is 86.    Wt Readings from Last 3 Encounters:  07/06/12 159 lb 11.2 oz (72.439 kg)  06/30/12 158 lb 6.4 oz (71.85 kg)  06/23/12 160 lb 3.2 oz (72.666 kg)    The right breast area shows mild hyperpigmentation changes. Lungs - Normal respiratory effort, chest expands symmetrically. Lungs are clear to auscultation, no  crackles or wheezes.  Heart has regular rhythm and rate  Abdomen is soft and non tender with normal bowel sounds  Assessment:  Patient tolerating treatments well  Plan: Continue treatment per original radiation prescription

## 2012-07-07 ENCOUNTER — Ambulatory Visit: Payer: Medicaid Other

## 2012-07-07 ENCOUNTER — Ambulatory Visit
Admission: RE | Admit: 2012-07-07 | Discharge: 2012-07-07 | Disposition: A | Discharge status: Home / Self Care | Payer: Medicaid Other | Source: Ambulatory Visit | Attending: Radiation Oncology | Admitting: Radiation Oncology

## 2012-07-08 ENCOUNTER — Ambulatory Visit: Payer: Medicaid Other

## 2012-07-08 ENCOUNTER — Ambulatory Visit
Admission: RE | Admit: 2012-07-08 | Discharge: 2012-07-08 | Disposition: A | Discharge status: Home / Self Care | Payer: Medicaid Other | Source: Ambulatory Visit | Attending: Radiation Oncology | Admitting: Radiation Oncology

## 2012-07-09 ENCOUNTER — Ambulatory Visit
Admission: RE | Admit: 2012-07-09 | Discharge: 2012-07-09 | Disposition: A | Discharge status: Home / Self Care | Payer: Medicaid Other | Source: Ambulatory Visit | Attending: Radiation Oncology | Admitting: Radiation Oncology

## 2012-07-09 ENCOUNTER — Ambulatory Visit: Payer: Medicaid Other

## 2012-07-10 ENCOUNTER — Ambulatory Visit: Payer: Medicaid Other

## 2012-07-10 ENCOUNTER — Ambulatory Visit
Admission: RE | Admit: 2012-07-10 | Discharge: 2012-07-10 | Disposition: A | Discharge status: Home / Self Care | Payer: Medicaid Other | Source: Ambulatory Visit | Attending: Radiation Oncology | Admitting: Radiation Oncology

## 2012-07-13 ENCOUNTER — Ambulatory Visit: Payer: Medicaid Other

## 2012-07-13 ENCOUNTER — Encounter (INDEPENDENT_AMBULATORY_CARE_PROVIDER_SITE_OTHER): Payer: Medicaid Other | Admitting: General Surgery

## 2012-07-13 ENCOUNTER — Ambulatory Visit (INDEPENDENT_AMBULATORY_CARE_PROVIDER_SITE_OTHER): Payer: Medicaid Other | Admitting: General Surgery

## 2012-07-13 ENCOUNTER — Ambulatory Visit
Admission: RE | Admit: 2012-07-13 | Discharge: 2012-07-13 | Disposition: A | Discharge status: Home / Self Care | Payer: Medicaid Other | Source: Ambulatory Visit | Attending: Radiation Oncology | Admitting: Radiation Oncology

## 2012-07-13 ENCOUNTER — Encounter (INDEPENDENT_AMBULATORY_CARE_PROVIDER_SITE_OTHER): Payer: Self-pay | Admitting: General Surgery

## 2012-07-13 VITALS — BP 124/92 | HR 96 | Temp 96.2°F | Ht 65.0 in | Wt 158.8 lb

## 2012-07-13 DIAGNOSIS — N6311 Unspecified lump in the right breast, upper outer quadrant: Secondary | ICD-10-CM

## 2012-07-13 DIAGNOSIS — N63 Unspecified lump in unspecified breast: Secondary | ICD-10-CM

## 2012-07-13 NOTE — Patient Instructions (Signed)
Continue radiation therapy and follow up with medical oncologist

## 2012-07-14 ENCOUNTER — Encounter: Payer: Self-pay | Admitting: Radiation Oncology

## 2012-07-14 ENCOUNTER — Ambulatory Visit
Admission: RE | Admit: 2012-07-14 | Discharge: 2012-07-14 | Disposition: A | Discharge status: Home / Self Care | Payer: Medicaid Other | Source: Ambulatory Visit | Attending: Radiation Oncology | Admitting: Radiation Oncology

## 2012-07-14 ENCOUNTER — Ambulatory Visit: Payer: Medicaid Other

## 2012-07-14 VITALS — BP 102/73 | HR 69 | Resp 16 | Wt 156.6 lb

## 2012-07-14 DIAGNOSIS — C50411 Malignant neoplasm of upper-outer quadrant of right female breast: Secondary | ICD-10-CM

## 2012-07-14 NOTE — Progress Notes (Signed)
Hyperpigmentation of right/treated breast noted. Dry desquamation under right breast noted. Patient reports using radiaplex as directed. Patient reports intense hot flashes continue. Blood pressure WDL. Patient reports taking BP medication as directed. Patient reports fatigue.

## 2012-07-14 NOTE — Progress Notes (Signed)
  Radiation Oncology         [phone] ________________________________  Name: Brooke Wood MRN: [id number]  Date: 07/14/2012  DOB: Mar 06, 1966  Weekly Radiation Therapy Management  Current Dose: 39.6 Gy     Planned Dose:  60.4 Gy  Narrative . . . . . . . . The patient presents for routine under treatment assessment.                                                     The patient is without complaint except for soreness and some itching in the right breast area.                                 Set-up films were reviewed.                                 The chart was checked. Physical Findings. . .  weight is 156 lb 9.6 oz (71.033 kg). Her blood pressure is 102/73 and her pulse is 69. Her respiration is 16. . Weight essentially stable.  The right breast area shows hyperpigmentation changes and erythema. In the inframammary fold area the patient does have some mild skin breakdown. Impression . . . . . . . The patient is  tolerating radiation. Plan . . . . . . . . . . . . Continue treatment as planned.  She was instructed to use a Triple Antibiotic ointment in the inframammary fold region and to continue radiaplex elsewhere.  ________________________________  -----------------------------------  Billie Lade, PhD, MD

## 2012-07-15 ENCOUNTER — Ambulatory Visit
Admission: RE | Admit: 2012-07-15 | Discharge: 2012-07-15 | Disposition: A | Discharge status: Home / Self Care | Payer: Medicaid Other | Source: Ambulatory Visit | Attending: Radiation Oncology | Admitting: Radiation Oncology

## 2012-07-16 ENCOUNTER — Ambulatory Visit: Payer: Medicaid Other

## 2012-07-16 ENCOUNTER — Ambulatory Visit
Admission: RE | Admit: 2012-07-16 | Discharge: 2012-07-16 | Disposition: A | Discharge status: Home / Self Care | Payer: Medicaid Other | Source: Ambulatory Visit | Attending: Radiation Oncology | Admitting: Radiation Oncology

## 2012-07-17 ENCOUNTER — Ambulatory Visit: Payer: Medicaid Other

## 2012-07-17 ENCOUNTER — Ambulatory Visit: Payer: Medicaid Other | Admitting: Nutrition

## 2012-07-17 ENCOUNTER — Ambulatory Visit
Admission: RE | Admit: 2012-07-17 | Discharge: 2012-07-17 | Disposition: A | Discharge status: Home / Self Care | Payer: Medicaid Other | Source: Ambulatory Visit | Attending: Radiation Oncology | Admitting: Radiation Oncology

## 2012-07-17 NOTE — Progress Notes (Signed)
Patient is a 47 year old female diagnosed with breast cancer receiving radiation therapy.  Past medical history includes hypertension and vocal cord dysfunction.  Medication labs were reviewed.  Height: 65 inches. Weight 156.6 pounds. BMI: 26.06.  Patient requesting nutrition consult to receive diet education on consuming healthy diet.  Nutrition diagnosis: Food and nutrition related knowledge deficit related to diagnosis of breast cancer as evidenced by no prior need for nutrition related information.  Intervention: Patient was educated on the importance of increasing plant-based foods and decreasing total fat and processed foods. I have reviewed a sample menu with patient at her request. Brooke Wood discussed the importance of controlling portion sizes, reading food labels, and balanced meals. I provided her with fact sheets and answered her questions. Contact information provided.  Monitoring, evaluation, goals: Patient will tolerate plant-based diet to improve health and reduce chance of recurrence.  Next visit: There is no followup scheduled patient has my contact information for questions.

## 2012-07-20 ENCOUNTER — Ambulatory Visit: Payer: Medicaid Other

## 2012-07-20 ENCOUNTER — Ambulatory Visit
Admission: RE | Admit: 2012-07-20 | Discharge: 2012-07-20 | Disposition: A | Discharge status: Home / Self Care | Payer: Medicaid Other | Source: Ambulatory Visit | Attending: Radiation Oncology | Admitting: Radiation Oncology

## 2012-07-20 NOTE — Progress Notes (Signed)
Subjective:     Patient ID: Brooke Wood, female   DOB: [birth year], 47 y.o.   MRN: [id number]  HPI The patient is a 47 year old black female who is 3 months status post right lumpectomy and negative sentinel node biopsy for a T2 N0 right breast cancer. She complains of some occasional sharp pains in the right breast. She is still receiving radiation therapy. Once that is completed she will start on tamoxifen.  Review of Systems  Constitutional: Negative.   HENT: Negative.   Eyes: Negative.   Respiratory: Negative.   Cardiovascular: Negative.   Gastrointestinal: Negative.   Endocrine: Negative.   Genitourinary: Negative.   Musculoskeletal: Negative.   Skin: Negative.   Allergic/Immunologic: Negative.   Neurological: Negative.   Hematological: Negative.   Psychiatric/Behavioral: Negative.        Objective:   Physical Exam  Constitutional: She is oriented to person, place, and time. She appears well-developed and well-nourished.  HENT:  Head: Normocephalic and atraumatic.  Eyes: Conjunctivae and EOM are normal. Pupils are equal, round, and reactive to light.  Neck: Normal range of motion. Neck supple.  Cardiovascular: Normal rate, regular rhythm and normal heart sounds.   Pulmonary/Chest: Effort normal and breath sounds normal.  There is radiation change to the right breast. There is some thickness to the scar. Otherwise there is no palpable mass in either breast. There is no palpable axillary or supraclavicular cervical lymphadenopathy.  Abdominal: Soft. Bowel sounds are normal. She exhibits no mass. There is no tenderness.  Musculoskeletal: Normal range of motion.  Lymphadenopathy:    She has no cervical adenopathy.  Neurological: She is alert and oriented to person, place, and time.  Skin: Skin is warm and dry.  Psychiatric: She has a normal mood and affect. Her behavior is normal.       Assessment:     The patient is 3 months status post right lumpectomy for breast  cancer     Plan:     At this point she will complete radiation therapy. She will then start on tamoxifen. She will continue to do regular self exams. We will plan to see her back in about 6 months.

## 2012-07-21 ENCOUNTER — Encounter: Payer: Self-pay | Admitting: Radiation Oncology

## 2012-07-21 ENCOUNTER — Ambulatory Visit
Admission: RE | Admit: 2012-07-21 | Discharge: 2012-07-21 | Disposition: A | Discharge status: Home / Self Care | Payer: Medicaid Other | Source: Ambulatory Visit | Attending: Radiation Oncology | Admitting: Radiation Oncology

## 2012-07-21 VITALS — BP 129/84 | HR 93 | Resp 16 | Wt 158.7 lb

## 2012-07-21 DIAGNOSIS — C50411 Malignant neoplasm of upper-outer quadrant of right female breast: Secondary | ICD-10-CM

## 2012-07-21 MED ORDER — RADIAPLEXRX EX GEL
Freq: Once | CUTANEOUS | Status: AC
  Administered 2012-07-21: 11:00:00 via TOPICAL

## 2012-07-21 NOTE — Progress Notes (Signed)
Patient presents to the clinic today unaccompanied for PUT with Dr. Roselind Messier. Patient is alert and oriented to person, place, and time. No distress noted. Steady gait noted. Pleasant affect noted. Patient denies pain at this time. Hyperpigmentation of right breast noted. Patient reports using radiaplex and neosporin as directed. Patient reports fatigue. Reported all findings to Dr. Roselind Messier.

## 2012-07-21 NOTE — Addendum Note (Signed)
Encounter addended by: Agnes Lawrence, RN on: 07/21/2012 11:07 AM<BR>     Documentation filed: Inpatient MAR, Orders

## 2012-07-21 NOTE — Progress Notes (Signed)
   Department of Radiation Oncology  Phone:  [phone] Fax:        [phone]  Simulation note  On 07/16/2012 the patient underwent additional planning for radiation therapy directed at the right breast area. The patient's treatment planning CT scan was reviewed and she had set up of a boost field directed at the lumpectomy cavity. The patient will be treated with an AP and right posterior oblique field. A computerized isodose plan will be generated for treatment. The patient will receive 5 treatments at 2 gray per treatment for an additional dose of 10 gray and a cumulative dose of 60.4 gray.  -----------------------------------  Billie Lade, PhD, MD

## 2012-07-21 NOTE — Progress Notes (Signed)
  Radiation Oncology         [phone] ________________________________  Name: Brooke Wood MRN: [id number]  Date: 07/21/2012  DOB: [birth year]  Weekly Radiation Therapy Management  Current Dose: 48.6 Gy     Planned Dose:  60.4 Gy  Narrative . . . . . . . . The patient presents for routine under treatment assessment.                                                     The patient is without complaint.  She has some soreness in the inframammary fold and axillary region. She is using Neosporin to these areas.                                 Set-up films were reviewed.                                 The chart was checked. Physical Findings. . .  weight is 158 lb 11.2 oz (71.986 kg). Her blood pressure is 129/84 and her pulse is 93. Her respiration is 16. . Weight essentially stable.  No significant changes. Hyperpigmentation changes throughout the right breast and dry desquamation. no active moist desquamation at this time. Impression . . . . . . . The patient is  tolerating radiation. Plan . . . . . . . . . . . . Continue treatment as planned.  ________________________________  -----------------------------------  Billie Lade, PhD, MD

## 2012-07-22 ENCOUNTER — Ambulatory Visit
Admission: RE | Admit: 2012-07-22 | Discharge: 2012-07-22 | Disposition: A | Discharge status: Home / Self Care | Payer: Medicaid Other | Source: Ambulatory Visit | Attending: Radiation Oncology | Admitting: Radiation Oncology

## 2012-07-22 ENCOUNTER — Ambulatory Visit: Payer: Medicaid Other

## 2012-07-23 ENCOUNTER — Ambulatory Visit
Admission: RE | Admit: 2012-07-23 | Discharge: 2012-07-23 | Disposition: A | Discharge status: Home / Self Care | Payer: Medicaid Other | Source: Ambulatory Visit | Attending: Radiation Oncology | Admitting: Radiation Oncology

## 2012-07-23 ENCOUNTER — Encounter: Payer: Self-pay | Admitting: Radiation Oncology

## 2012-07-23 ENCOUNTER — Ambulatory Visit: Payer: Medicaid Other

## 2012-07-23 NOTE — Progress Notes (Signed)
  Radiation Oncology         [phone] ________________________________  Name: Brooke Wood MRN: [id number]  Date: 07/23/2012  DOB: 07-09-1965  Simulation Verification Note  Status: outpatient  NARRATIVE: The patient was brought to the treatment unit and placed in the planned treatment position. The clinical setup was verified. Then port films were obtained and uploaded to the radiation oncology medical record software.  The treatment beams were carefully compared against the planned radiation fields. The position location and shape of the radiation fields was reviewed. They targeted volume of tissue appears to be appropriately covered by the radiation beams. Organs at risk appear to be excluded as planned.  Based on my personal review, I approved the simulation verification. The patient's treatment will proceed as planned.  -----------------------------------  Billie Lade, PhD, MD

## 2012-07-24 ENCOUNTER — Other Ambulatory Visit (HOSPITAL_BASED_OUTPATIENT_CLINIC_OR_DEPARTMENT_OTHER): Payer: Medicaid Other | Admitting: Lab

## 2012-07-24 ENCOUNTER — Telehealth: Payer: Self-pay | Admitting: Oncology

## 2012-07-24 ENCOUNTER — Ambulatory Visit (HOSPITAL_BASED_OUTPATIENT_CLINIC_OR_DEPARTMENT_OTHER): Payer: Medicaid Other | Admitting: Oncology

## 2012-07-24 ENCOUNTER — Ambulatory Visit: Payer: Medicaid Other

## 2012-07-24 ENCOUNTER — Encounter: Payer: Self-pay | Admitting: Oncology

## 2012-07-24 ENCOUNTER — Ambulatory Visit
Admission: RE | Admit: 2012-07-24 | Discharge: 2012-07-24 | Disposition: A | Discharge status: Home / Self Care | Payer: Medicaid Other | Source: Ambulatory Visit | Attending: Radiation Oncology | Admitting: Radiation Oncology

## 2012-07-24 VITALS — BP 142/93 | HR 99 | Temp 98.0°F | Resp 20 | Ht 65.0 in | Wt 158.8 lb

## 2012-07-24 DIAGNOSIS — Z170 Estrogen receptor positive status [ER+]: Secondary | ICD-10-CM

## 2012-07-24 DIAGNOSIS — C50419 Malignant neoplasm of upper-outer quadrant of unspecified female breast: Secondary | ICD-10-CM

## 2012-07-24 DIAGNOSIS — C50411 Malignant neoplasm of upper-outer quadrant of right female breast: Secondary | ICD-10-CM

## 2012-07-24 LAB — COMPREHENSIVE METABOLIC PANEL (CC13)
AST: 11 U/L (ref 5–34)
BUN: 14 mg/dL (ref 7.0–26.0)
Calcium: 9.5 mg/dL (ref 8.4–10.4)
Chloride: 98 mEq/L (ref 98–107)
Creatinine: 0.8 mg/dL (ref 0.6–1.1)
Glucose: 158 mg/dl — ABNORMAL HIGH (ref 70–99)

## 2012-07-24 LAB — CBC WITH DIFFERENTIAL/PLATELET
Basophils Absolute: 0 10*3/uL (ref 0.0–0.1)
EOS%: 0.9 % (ref 0.0–7.0)
HCT: 42.2 % (ref 34.8–46.6)
HGB: 14.6 g/dL (ref 11.6–15.9)
MCH: 30.3 pg (ref 25.1–34.0)
MCV: 87.6 fL (ref 79.5–101.0)
NEUT%: 73.6 % (ref 38.4–76.8)
lymph#: 0.8 10*3/uL — ABNORMAL LOW (ref 0.9–3.3)

## 2012-07-24 MED ORDER — TAMOXIFEN CITRATE 20 MG PO TABS: 20.0000 mg | ORAL_TABLET | Freq: Every day | ORAL | Status: DC

## 2012-07-24 NOTE — Progress Notes (Signed)
OFFICE PROGRESS NOTE  CC  [name], MD [address] [facility] [address] [place], [address] [place] [address] Dr. Chevis Pretty Dr. 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  PRIOR THERAPY:  #1 patient was originally seen in the multidisciplinary breast clinic on 03/11/2012 with new diagnosis of a palpable mass in the right breast measuring 2.9 cm on mammogram. Her MRI revealed the mass to be 2.7 cm. Core needle biopsy showed invasive lobular carcinoma ER positive PR positive HER-2/neu negative with a low Ki-67.  #2 she has now gone on to have a lumpectomy with sentinel lymph node biopsy. Her final pathology reveals a 2.8 cm invasive tubular lobular carcinoma grade 2 one sentinel node has isolated tumor cells. Tumor is ER +99% PR +4% HER-2/neu negative Ki-67 60%. Pathologic stage T2N(i).  #3 patient had an Oncotype DX performed her score was 10 giving her a 7% risk of distance recurrence with tamoxifen for 5 years. I have discussed this in detail with her  #4 patient is currently receiving radiation therapy she is tolerating it well. She will be finishing this up on April 30.  #5 once patient completes radiation I will give her tamoxifen 20 mg daily adjuvantly for duration of 10 years.  CURRENT THERAPY: begin tamoxifen after completion of radiation  INTERVAL HISTORY: Brooke Wood 47 y.o. female returns for followup visit. Clinically patient seems to be doing well without any problems. She has no nausea vomiting fevers chills night sweats. Her right breast wound still is a little tender. Remainder of the 10 point review of systems is negative  MEDICAL HISTORY: Past Medical History  Diagnosis Date  . Allergy     Neg RAST 2007  . Hypertension   . Vocal cord dysfunction     Neg Methacholine challenge test 2007  . Breast cancer     right breast     ALLERGIES:  is allergic to aspirin; codeine; and codeine phosphate.  MEDICATIONS:  Current Outpatient Prescriptions  Medication Sig Dispense Refill  . non-metallic deodorant (ALRA) MISC Apply 1 application topically daily as needed.      . valsartan-hydrochlorothiazide (DIOVAN-HCT) 160-25 MG per tablet Take 1 tablet by mouth daily.      . Wound Cleansers (RADIAPLEX EX) Apply topically.       No current facility-administered medications for this visit.    SURGICAL HISTORY:  Past Surgical History  Procedure Laterality Date  . Breast surgery  1985    rt br bx  . Breast lumpectomy with sentinel lymph node biopsy  04/09/2012    Procedure: BREAST LUMPECTOMY WITH SENTINEL LYMPH NODE BX;  Surgeon: Robyne Askew, MD;  Location: [facility];  Service: General;  Laterality: Right;    REVIEW OF SYSTEMS:  Pertinent items are noted in HPI.   HEALTH MAINTENANCE:   PHYSICAL EXAMINATION: Blood pressure 142/93, pulse 99, temperature 98 F (36.7 C), temperature source Oral, resp. rate 20, height 5\' 5"  (1.651 m), weight 158 lb 12.8 oz (72.031 kg). Body mass index is 26.43 kg/(m^2). ECOG PERFORMANCE STATUS: 0 - Asymptomatic   General appearance: alert, cooperative and appears stated age Lymph nodes: Cervical, supraclavicular, and axillary nodes normal. Resp: clear to auscultation bilaterally Back: symmetric, no curvature. ROM normal. No CVA tenderness. Cardio: regular rate and rhythm GI: soft, non-tender; bowel sounds normal; no masses,  no organomegaly Extremities: extremities  normal, atraumatic, no cyanosis or edema Neurologic: Grossly normal   LABORATORY DATA: Lab Results  Component Value Date   WBC 5.4 07/24/2012   HGB 14.6 07/24/2012   HCT 42.2 07/24/2012   MCV 87.6 07/24/2012   PLT 201 07/24/2012      Chemistry      Component Value Date/Time   NA 134* 07/24/2012 1248   NA 139 04/09/2012 0705   K 3.7 07/24/2012 1248   K 4.7 04/09/2012 0705   CL 98 07/24/2012 1248    CL 98 04/09/2012 0705   CO2 27 07/24/2012 1248   CO2 27 07/19/2010 1022   BUN 14.0 07/24/2012 1248   BUN 18 04/09/2012 0705   CREATININE 0.8 07/24/2012 1248   CREATININE 0.80 04/09/2012 0705      Component Value Date/Time   CALCIUM 9.5 07/24/2012 1248   CALCIUM 9.7 07/19/2010 1022   ALKPHOS 180* 07/24/2012 1248   AST 11 07/24/2012 1248   ALT 16 07/24/2012 1248   BILITOT 0.41 07/24/2012 1248     ADDITIONAL INFORMATION: 1. CHROMOGENIC IN-SITU HYBRIDIZATION Interpretation HER-2/NEU BY CISH - NO AMPLIFICATION OF HER-2 DETECTED. THE RATIO OF HER-2: CEP 17 SIGNALS WAS 1.10. H. Hollice Espy MD Pathologist, Electronic Signature ( Signed 04/16/2012) FINAL DIAGNOSIS Diagnosis 1. Breast, lumpectomy, right - INVASIVE TUBULO-LOBULAR CARCINOMA, GRADE II/III, SPANNING 2.8 CM. 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 - THE SURGICAL RESECTION MARGINS ARE NEGATIVE FOR INVASIVE CARCINOMA. - SEE ONCOLOGY TABLE BELOW. 2. Lymph node, sentinel, biopsy, Right #1 - ISOLATED TUMOR CELL(S) IN 1 OF 1 LYMPH NODE. - SEE COMMENT. 3. Breast, excision, Right-new inferior margin - BENIGN BREAST PARENCHYMA. - THERE IS NO EVIDENCE OF MALIGNANCY. - SEE COMMENT. Microscopic Comment 1. BREAST, INVASIVE TUMOR, WITH LYMPH NODE SAMPLING Specimen, including laterality: Right breast 1 of 3 FINAL for Hua, Obera I ([id number]) Microscopic Comment(continued) Procedure: Lumpectomy Grade: II Tubule formation: 3 Nuclear pleomorphism: 2 Mitotic: 1 Tumor size (gross measurement): 2.8 cm Margins: Negative for carcinoma Invasive, distance to closest margin: 0.5 cm to the inferior margin of specimen #1 (gross measurement) Lymphovascular invasion: Not identified Ductal carcinoma in situ: Not identified Tumor focality: Unifocal Treatment effect: N/A Extent of tumor: Confined to breast parenchyma. Lymph nodes: # examined: 1 Lymph nodes with metastasis: 0 Isolated tumor cells (< 0.2 mm): 1 Micrometastasis:  (> 0.2 mm and < 2.0 mm): 0 Macrometastasis: (> 2.0 mm): 0 Extracapsular extension: Not identified. Breast prognostic profile: Case [id number] Estrogen receptor: 99%, strong staining intensity Progesterone receptor: 84%, strong staining intensity Her 2 neu: No amplification was detected. The ratio was 1.30. Her 2 neu by CISH will be repeated on the current case and the results reported separately. Ki-67: 16% Non-neoplastic breast: Healing biopsy site TNM: pT2, pN0 (i+) Comments: Immunohistochemical stains performed on the lymph node reveal cytokeratin positive isolated tumor cell(s). Most of the invasive cells are negative for E-Cadherin, supporting a lobular phenotype. However, within the 2.8 cm tumor are distinct areas of phenotypically dissimilar invasive carcinoma cells consisting of well formed glands. These components are strongly positive for E-Cadherin, supporting a component of ductal carcinoma. Overall, I believe this invasive tumor is best phenotyped as an invasive tubulo-lobular carcinoma. 3. The surgical resection margin(s) of the specimen were inked and microscopically evaluated.  RADIOGRAPHIC STUDIES:  Nm Sentinel Node Inj-no Rpt (breast)  04/09/2012  CLINICAL DATA: right breast cancer   Sulfur colloid was injected intradermally by the nuclear medicine  technologist for breast cancer sentinel node localization.  ASSESSMENT: 47 year old female with  #1 stage II (T2 N(i)) invasive tubular lobular carcinoma patient is status post lumpectomy in January 2014. She is doing well postop. We will send off an Oncotype DX testing on her. She and I discussed the rationale for this today. She will also be referred to radiation oncology  #2 patient had an Oncotype DX testing her breast cancer recurrence score is 10 giving her about 7% risk of distance recurrence with tamoxifen for 5 years. I have discussed this with the patient. Plan will be eventually to put her on tamoxifen  since she is premenopausal. There is new data regarding use of tamoxifen for 10 years we also discussed this.  #3patient will finish her radiation on April 30. Upon completion of this she will begin tamoxifen 20 mg daily. Prescription in for this was given to her. Risks and benefits and side effects of tamoxifen were discussed with her. Duration of therapy was also discussed with her which will be for 10 years.  PLAN:  #1 complete radiation therapy  #2 tamoxifen 20 mg daily to begin in May 2014  All questions were answered. The patient knows to call the clinic with any problems, questions or concerns. We can certainly see the patient much sooner if necessary.  I spent 25 minutes counseling the patient face to face. The total time spent in the appointment was 30 minutes.    Drue Second, MD Medical/Oncology [facility] [phone] (beeper) [phone] (Office)  07/24/2012, 1:59 PM

## 2012-07-24 NOTE — Patient Instructions (Addendum)
Proceed with tamoxifen after you finish up radiation   We will see you back in 3 months time  Tamoxifen oral tablet What is this medicine? TAMOXIFEN (ta MOX i fen) blocks the effects of estrogen. It is commonly used to treat breast cancer. It is also used to decrease the chance of breast cancer coming back in women who have received treatment for the disease. It may also help prevent breast cancer in women who have a high risk of developing breast cancer. This medicine may be used for other purposes; ask your health care provider or pharmacist if you have questions. What should I tell my health care provider before I take this medicine? They need to know if you have any of these conditions: -blood clots -blood disease -cataracts or impaired eyesight -endometriosis -high calcium levels -high cholesterol -irregular menstrual cycles -liver disease -stroke -uterine fibroids -an unusual or allergic reaction to tamoxifen, other medicines, foods, dyes, or preservatives -pregnant or trying to get pregnant -breast-feeding How should I use this medicine? Take this medicine by mouth with a glass of water. Follow the directions on the prescription label. You can take it with or without food. Take your medicine at regular intervals. Do not take your medicine more often than directed. Do not stop taking except on your doctor's advice. A special MedGuide will be given to you by the pharmacist with each prescription and refill. Be sure to read this information carefully each time. Talk to your pediatrician regarding the use of this medicine in children. While this drug may be prescribed for selected conditions, precautions do apply. Overdosage: If you think you have taken too much of this medicine contact a poison control center or emergency room at once. NOTE: This medicine is only for you. Do not share this medicine with others. What if I miss a dose? If you miss a dose, take it as soon as you can.  If it is almost time for your next dose, take only that dose. Do not take double or extra doses. What may interact with this medicine? -aminoglutethimide -bromocriptine -chemotherapy drugs -female hormones, like estrogens and birth control pills -letrozole -medroxyprogesterone -phenobarbital -rifampin -warfarin This list may not describe all possible interactions. Give your health care provider a list of all the medicines, herbs, non-prescription drugs, or dietary supplements you use. Also tell them if you smoke, drink alcohol, or use illegal drugs. Some items may interact with your medicine. What should I watch for while using this medicine? Visit your doctor or health care professional for regular checks on your progress. You will need regular pelvic exams, breast exams, and mammograms. If you are taking this medicine to reduce your risk of getting breast cancer, you should know that this medicine does not prevent all types of breast cancer. If breast cancer or other problems occur, there is no guarantee that it will be found at an early stage. Do not become pregnant while taking this medicine or for 2 months after stopping this medicine. Stop taking this medicine if you get pregnant or think you are pregnant and contact your doctor. This medicine may harm your unborn baby. Women who can possibly become pregnant should use birth control methods that do not use hormones during tamoxifen treatment and for 2 months after therapy has stopped. Talk with your health care provider for birth control advice. Do not breast feed while taking this medicine. What side effects may I notice from receiving this medicine? Side effects that you should report to  your doctor or health care professional as soon as possible: -changes in vision (blurred vision) -changes in your menstrual cycle -difficulty breathing or shortness of breath -difficulty walking or talking -new breast lumps -numbness -pelvic pain or  pressure -redness, blistering, peeling or loosening of the skin, including inside the mouth -skin rash or itching (hives) -sudden chest pain -swelling of lips, face, or tongue -swelling, pain or tenderness in your calf or leg -unusual bruising or bleeding -vaginal discharge that is bloody, brown, or rust -weakness -yellowing of the whites of the eyes or skin Side effects that usually do not require medical attention (report to your doctor or health care professional if they continue or are bothersome): -fatigue -hair loss, although uncommon and is usually mild -headache -hot flashes -impotence (in men) -nausea, vomiting (mild) -vaginal discharge (white or clear) This list may not describe all possible side effects. Call your doctor for medical advice about side effects. You may report side effects to FDA at 1-800-FDA-1088. Where should I keep my medicine? Keep out of the reach of children. Store at room temperature between 20 and 25 degrees C (68 and 77 degrees F). Protect from light. Keep container tightly closed. Throw away any unused medicine after the expiration date. NOTE: This sheet is a summary. It may not cover all possible information. If you have questions about this medicine, talk to your doctor, pharmacist, or health care provider.  2012, [employer]vier/[employer] Standard. (12/03/2007 12:01:56 PM)

## 2012-07-27 ENCOUNTER — Ambulatory Visit
Admission: RE | Admit: 2012-07-27 | Discharge: 2012-07-27 | Disposition: A | Discharge status: Home / Self Care | Payer: Medicaid Other | Source: Ambulatory Visit | Attending: Radiation Oncology | Admitting: Radiation Oncology

## 2012-07-27 ENCOUNTER — Ambulatory Visit: Payer: Medicaid Other

## 2012-07-28 ENCOUNTER — Ambulatory Visit
Admission: RE | Admit: 2012-07-28 | Discharge: 2012-07-28 | Disposition: A | Discharge status: Home / Self Care | Payer: Medicaid Other | Source: Ambulatory Visit | Attending: Radiation Oncology | Admitting: Radiation Oncology

## 2012-07-28 ENCOUNTER — Ambulatory Visit: Payer: Medicaid Other

## 2012-07-28 VITALS — BP 135/100 | HR 98 | Temp 98.9°F | Wt 157.3 lb

## 2012-07-28 DIAGNOSIS — C50411 Malignant neoplasm of upper-outer quadrant of right female breast: Secondary | ICD-10-CM

## 2012-07-28 NOTE — Progress Notes (Signed)
[facility]    Radiation Oncology [address] [place]     Maryln Gottron, M.D. [place], [place] [address]               Billie Lade, M.D., Ph.D. Phone: [phone]      Molli Hazard A. Kathrynn Running, M.D. Fax: [phone]      Radene Gunning, M.D., Ph.D.         Lurline Hare, M.D.         Grayland Jack, M.D Weekly Treatment Management Note  Name: Brooke Wood     MRN: [id number]        CSN: [id number] Date: 07/28/2012      DOB: December 14, 1965  CC: [name], MD         [name]    Status: Outpatient  Diagnosis: The encounter diagnosis was Cancer of upper-outer quadrant of female breast, right.  Current Dose: 58.4 Gy   Current Fraction: 32  Planned Dose: 60.4 Gy  Narrative: [name] was seen today for weekly treatment management. The chart was checked and port films  were reviewed. She is having some itching and discomfort in the breast area. She is using Neosporin in the inframammary fold and radiaplex elsewhere  Aspirin; Codeine; and Codeine phosphate Current Outpatient Prescriptions  Medication Sig Dispense Refill  . non-metallic deodorant (ALRA) MISC Apply 1 application topically daily as needed.      . valsartan-hydrochlorothiazide (DIOVAN-HCT) 160-25 MG per tablet Take 1 tablet by mouth daily.      . Wound Cleansers (RADIAPLEX EX) Apply topically.      . tamoxifen (NOLVADEX) 20 MG tablet Take 1 tablet (20 mg total) by mouth daily.  90 tablet  12   No current facility-administered medications for this encounter.   Labs:  Lab Results  Component Value Date   WBC 5.4 07/24/2012   HGB 14.6 07/24/2012   HCT 42.2 07/24/2012   MCV 87.6 07/24/2012   PLT 201 07/24/2012   Lab Results  Component Value Date   CREATININE 0.8 07/24/2012   BUN 14.0 07/24/2012   NA 134* 07/24/2012   K 3.7 07/24/2012   CL 98 07/24/2012   CO2 27 07/24/2012   Lab Results  Component Value Date   ALT 16 07/24/2012   AST 11 07/24/2012   BILITOT 0.41 07/24/2012    Physical Examination:   weight is 157 lb 4.8 oz (71.351 kg). Her temperature is 98.9 F (37.2 C). Her blood pressure is 135/100 and her pulse is 98. Her oxygen saturation is 99%.    Wt Readings from Last 3 Encounters:  07/28/12 157 lb 4.8 oz (71.351 kg)  07/24/12 158 lb 12.8 oz (72.031 kg)  07/21/12 158 lb 11.2 oz (71.986 kg)    The right breast area shows dry desquamation hyperpigmentation changes throughout. In the inframammary fold area there is a small area of moist desquamation. Lungs - Normal respiratory effort, chest expands symmetrically. Lungs are clear to auscultation, no crackles or wheezes.  Heart has regular rhythm and rate  Abdomen is soft and non tender with normal bowel sounds  Assessment:  Patient tolerating treatments well except for above issues  Plan: Continue treatment per original radiation prescription

## 2012-07-28 NOTE — Progress Notes (Signed)
Patient here for routine weekly assessment of radiation to right breast.Completed 32 of 33 treatments.Skin with dark hyperpigmentation and wet peeling underneath breast.Currently applying neosporin to right mammary fold and radiaplex.Moderate fatigue.

## 2012-07-29 ENCOUNTER — Ambulatory Visit
Admission: RE | Admit: 2012-07-29 | Discharge: 2012-07-29 | Disposition: A | Discharge status: Home / Self Care | Payer: Medicaid Other | Source: Ambulatory Visit | Attending: Radiation Oncology | Admitting: Radiation Oncology

## 2012-07-29 DIAGNOSIS — N6311 Unspecified lump in the right breast, upper outer quadrant: Secondary | ICD-10-CM

## 2012-07-29 MED ORDER — RADIAPLEXRX EX GEL
Freq: Once | CUTANEOUS | Status: AC
  Administered 2012-07-29: 1 via TOPICAL

## 2012-07-30 ENCOUNTER — Ambulatory Visit: Payer: Medicaid Other

## 2012-07-31 ENCOUNTER — Ambulatory Visit: Payer: Medicaid Other

## 2012-08-05 ENCOUNTER — Ambulatory Visit (INDEPENDENT_AMBULATORY_CARE_PROVIDER_SITE_OTHER): Payer: Medicaid Other | Admitting: General Surgery

## 2012-08-05 ENCOUNTER — Encounter (INDEPENDENT_AMBULATORY_CARE_PROVIDER_SITE_OTHER): Payer: Self-pay | Admitting: General Surgery

## 2012-08-05 DIAGNOSIS — C50411 Malignant neoplasm of upper-outer quadrant of right female breast: Secondary | ICD-10-CM

## 2012-08-05 DIAGNOSIS — C50419 Malignant neoplasm of upper-outer quadrant of unspecified female breast: Secondary | ICD-10-CM

## 2012-08-05 NOTE — Patient Instructions (Signed)
May return to work next week Continue tamoxifen Continue regular self exams

## 2012-08-05 NOTE — Progress Notes (Signed)
Subjective:     Patient ID: Brooke Wood, female   DOB: [birth year], 47 y.o.   MRN: [id number]  HPI The patient is a 47 year old black female who is 4 months status post right lumpectomy and negative sentinel node biopsy for a T2 N0 right breast cancer. Since her last visit she has finished radiation therapy. She is now taking tamoxifen. She feels as though the tamoxifen makes her tired. She returns today because she wants to be pushed out of work for longer. She denies any fevers or chills. She denies any chest pain or shortness of breath. She does note occasional sharp pains in her right breast.  Review of Systems  Constitutional: Negative.   HENT: Negative.   Eyes: Negative.   Respiratory: Negative.   Cardiovascular: Negative.   Gastrointestinal: Negative.   Endocrine: Negative.   Genitourinary: Negative.   Musculoskeletal: Negative.   Skin: Negative.   Allergic/Immunologic: Negative.   Neurological: Negative.   Hematological: Negative.   Psychiatric/Behavioral: Negative.        Objective:   Physical Exam  Constitutional: She is oriented to person, place, and time. She appears well-developed and well-nourished.  HENT:  Head: Normocephalic and atraumatic.  Eyes: Conjunctivae and EOM are normal. Pupils are equal, round, and reactive to light.  Neck: Normal range of motion. Neck supple.  Cardiovascular: Normal rate, regular rhythm and normal heart sounds.   Pulmonary/Chest: Effort normal and breath sounds normal.  Abdominal: Soft. Bowel sounds are normal.  Musculoskeletal: Normal range of motion.  Lymphadenopathy:    She has no cervical adenopathy.  Neurological: She is alert and oriented to person, place, and time.  Skin: Skin is warm and dry.  Psychiatric: She has a normal mood and affect. Her behavior is normal.       Assessment:     The patient is 4 months status post right lumpectomy for breast cancer     Plan:     At this point I think from a mechanical  standpoint she should be able to go back to work. I will perform light duty for the first couple weeks if she goes back and then she can return to her normal activities. Will plan to see her back in about 6 months

## 2012-08-12 ENCOUNTER — Encounter: Payer: Self-pay | Admitting: Radiation Oncology

## 2012-08-12 NOTE — Progress Notes (Signed)
  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 The site of presentation (lumpectomy cavity) in the upper outer quadrant of the right breast was boosted to a cumulative dose of 60.4 gray  Beams/energy:   Tangential beams with forward planning for the initial treatment. The boost field was with an AP and right posterior oblique photon beam arrangement.  Narrative: The patient tolerated radiation treatment relatively well.   Towards the end of her therapy the patient developed some itching and discomfort in the right breast. She also developed some very mild moist desquamation in the inframammary fold area which responded to a triple antibiotic ointment.  Plan: The patient has completed radiation treatment. The patient will return to radiation oncology clinic for routine followup in one month. I advised them to call or return sooner if they have any questions or concerns related to their recovery or treatment.  -----------------------------------  Billie Lade, PhD, MD

## 2012-08-28 ENCOUNTER — Encounter: Payer: Self-pay | Admitting: Radiation Oncology

## 2012-08-28 DIAGNOSIS — Z923 Personal history of irradiation: Secondary | ICD-10-CM | POA: Insufficient documentation

## 2012-08-31 ENCOUNTER — Encounter: Payer: Self-pay | Admitting: Radiation Oncology

## 2012-08-31 ENCOUNTER — Ambulatory Visit
Admission: RE | Admit: 2012-08-31 | Discharge: 2012-08-31 | Disposition: A | Discharge status: Home / Self Care | Payer: Medicaid Other | Source: Ambulatory Visit | Attending: Radiation Oncology | Admitting: Radiation Oncology

## 2012-08-31 VITALS — BP 125/86 | HR 103 | Temp 98.3°F | Resp 20 | Wt 158.4 lb

## 2012-08-31 DIAGNOSIS — C50411 Malignant neoplasm of upper-outer quadrant of right female breast: Secondary | ICD-10-CM

## 2012-08-31 NOTE — Progress Notes (Signed)
Pt denies pain, fatigue, loss of appetite, problems w/right breast. She states she will go back to work 15 hrs/week in 2 weeks. Pt taking Tamoxifen 20 mg daily.

## 2012-08-31 NOTE — Progress Notes (Signed)
  Radiation Oncology         [phone] ________________________________  Name: Brooke Wood MRN: [id number]  Date: 08/31/2012  DOB: 1965-04-24  Follow-Up Visit Note  CC: [name], MD  Victorino December, MD  Diagnosis:   Stage II a invasive tubulo lobular carcinoma of the right breast  Interval Since Last Radiation:  1  months  Narrative:  The patient returns today for routine follow-up.  She is doing well at this time. She has minimal fatigue. She denies any significant itching or discomfort in the breast area. She denies any nipple discharge or bleeding. She denies any problems with swelling in her right arm or hand. Patient has started tamoxifen and seems to be tolerating this well.                              ALLERGIES:  is allergic to aspirin; codeine; and codeine phosphate.  Meds: Current Outpatient Prescriptions  Medication Sig Dispense Refill  . tamoxifen (NOLVADEX) 20 MG tablet Take 20 mg by mouth daily.      . valsartan-hydrochlorothiazide (DIOVAN-HCT) 160-25 MG per tablet Take 1 tablet by mouth daily.       No current facility-administered medications for this encounter.    Physical Findings: The patient is in no acute distress. Patient is alert and oriented.  weight is 158 lb 6.4 oz (71.85 kg). Her oral temperature is 98.3 F (36.8 C). Her blood pressure is 125/86 and her pulse is 103. Her respiration is 20. .  The right breast area shows hyperpigmentation changes. The skin is well healed at this time. No dominant masses appreciated in the breast. The lungs are clear. The heart has a regular rhythm and rate. There is no palpable supraclavicular or axillary adenopathy.  Lab Findings: Lab Results  Component Value Date   WBC 5.4 07/24/2012   HGB 14.6 07/24/2012   HCT 42.2 07/24/2012   MCV 87.6 07/24/2012   PLT 201 07/24/2012    Radiographic Findings: No results found.  Impression:  The patient is recovering from the effects of radiation.  No evidence of recurrence  on clinical exam today.  Plan:  Routine followup in 6 months.  _____________________________________  -----------------------------------  Billie Lade, PhD, MD

## 2012-10-06 ENCOUNTER — Telehealth (INDEPENDENT_AMBULATORY_CARE_PROVIDER_SITE_OTHER): Payer: Self-pay | Admitting: General Surgery

## 2012-10-06 NOTE — Telephone Encounter (Signed)
Patient calling status post right breast lumpectomy 04/09/2012. She is complaining today of two days of swelling in the right breast. She denies swelling in her arm or her other breast. She denies redness, nipple discharge, drainage from her incision or pain. I offered follow up appt with Dr Carolynne Edouard but patient already has an appt with Dr Roselind Messier on Thursday. She states she will have Dr Roselind Messier examine her breast and call us if needed. I let her know I would inform Dr Carolynne Edouard of her symptoms.

## 2012-10-08 ENCOUNTER — Encounter: Payer: Self-pay | Admitting: Radiation Oncology

## 2012-10-08 ENCOUNTER — Ambulatory Visit
Admission: RE | Admit: 2012-10-08 | Discharge: 2012-10-08 | Disposition: A | Discharge status: Home / Self Care | Payer: Medicaid Other | Source: Ambulatory Visit | Attending: Radiation Oncology | Admitting: Radiation Oncology

## 2012-10-08 VITALS — BP 133/90 | HR 90 | Temp 98.2°F | Ht 65.0 in | Wt 157.9 lb

## 2012-10-08 DIAGNOSIS — C50411 Malignant neoplasm of upper-outer quadrant of right female breast: Secondary | ICD-10-CM

## 2012-10-08 NOTE — Progress Notes (Signed)
  Radiation Oncology         [phone]) [phone] ________________________________  Name: Brooke Wood MRN: [id number]  Date: 10/08/2012  DOB: 1966/03/12  Follow-Up Visit Note  CC: [name], MD  Victorino December, MD  Diagnosis:   Stage II a invasive tubulo lobular carcinoma of the right breast  Interval Since Last Radiation:  2  months  Narrative:  The patient returns today for routine follow-up.  She continues to have some fatigue. She does not feel she can work over 5 hours a day at this time. I have written her a note to limit her work schedule to 5 hours per day until January when I see the patient in routine followup.  She denies any itching or discomfort in the right breast. She denies any nipple discharge or bleeding. She denies any problems with swelling in her right arm or hand. She is on tamoxifen and is having some hot flashes associated with this medication.                              ALLERGIES:  is allergic to aspirin; codeine; and codeine phosphate.  Meds: Current Outpatient Prescriptions  Medication Sig Dispense Refill  . tamoxifen (NOLVADEX) 20 MG tablet Take 20 mg by mouth daily.      . valsartan-hydrochlorothiazide (DIOVAN-HCT) 160-25 MG per tablet Take 1 tablet by mouth daily.       No current facility-administered medications for this encounter.    Physical Findings: The patient is in no acute distress. Patient is alert and oriented.  height is 5\' 5"  (1.651 m) and weight is 157 lb 14.4 oz (71.623 kg). Her temperature is 98.2 F (36.8 C). Her blood pressure is 133/90 and her pulse is 90. Her oxygen saturation is 98%. .  No palpable supraclavicular or axillary adenopathy. The lungs are clear to auscultation. The heart has a regular rhythm and rate. Examination of the left breast reveals no mass or nipple discharge. Examination of the right breast reveals some continued edema. There hyperpigmentation changes noted throughout the breast. There is no dominant mass  appreciated breast nipple discharge or bleeding.  Lab Findings: Lab Results  Component Value Date   WBC 5.4 07/24/2012   HGB 14.6 07/24/2012   HCT 42.2 07/24/2012   MCV 87.6 07/24/2012   PLT 201 07/24/2012      Radiographic Findings: No results found.  Impression:  The patient is recovering from the effects of radiation.  No evidence for recurrence on clinical exam today  Plan: Routine followup in 6 months.  _____________________________________  -----------------------------------  Billie Lade, PhD, MD

## 2012-10-08 NOTE — Progress Notes (Signed)
Brooke Wood here for follow up after treatment to her right breast.  She denies pain.  The skin on her breast is intact with hyperpigmentation.  She states that it is swollen.  She has returned to work at [employer].  She has occasional fatigue.  She is having hot flashes.  She is taking tamoxifen.

## 2012-10-23 ENCOUNTER — Other Ambulatory Visit: Payer: Self-pay | Admitting: *Deleted

## 2012-10-23 ENCOUNTER — Ambulatory Visit (HOSPITAL_BASED_OUTPATIENT_CLINIC_OR_DEPARTMENT_OTHER): Payer: Medicaid Other | Admitting: Oncology

## 2012-10-23 ENCOUNTER — Encounter: Payer: Self-pay | Admitting: Oncology

## 2012-10-23 ENCOUNTER — Other Ambulatory Visit (HOSPITAL_COMMUNITY): Payer: Self-pay | Admitting: Internal Medicine

## 2012-10-23 ENCOUNTER — Other Ambulatory Visit (HOSPITAL_BASED_OUTPATIENT_CLINIC_OR_DEPARTMENT_OTHER): Payer: Medicaid Other | Admitting: Lab

## 2012-10-23 ENCOUNTER — Telehealth: Payer: Self-pay | Admitting: *Deleted

## 2012-10-23 VITALS — BP 129/91 | HR 77 | Temp 98.0°F | Resp 20 | Ht 65.0 in | Wt 156.1 lb

## 2012-10-23 DIAGNOSIS — C50411 Malignant neoplasm of upper-outer quadrant of right female breast: Secondary | ICD-10-CM

## 2012-10-23 DIAGNOSIS — C50419 Malignant neoplasm of upper-outer quadrant of unspecified female breast: Secondary | ICD-10-CM

## 2012-10-23 LAB — COMPREHENSIVE METABOLIC PANEL (CC13)
Albumin: 3.7 g/dL (ref 3.5–5.0)
BUN: 11.6 mg/dL (ref 7.0–26.0)
CO2: 24 mEq/L (ref 22–29)
Calcium: 9.8 mg/dL (ref 8.4–10.4)
Chloride: 103 mEq/L (ref 98–109)
Creatinine: 0.7 mg/dL (ref 0.6–1.1)
Glucose: 98 mg/dl (ref 70–140)
Potassium: 3.6 mEq/L (ref 3.5–5.1)

## 2012-10-23 LAB — CBC WITH DIFFERENTIAL/PLATELET
Basophils Absolute: 0 10*3/uL (ref 0.0–0.1)
Eosinophils Absolute: 0 10*3/uL (ref 0.0–0.5)
HCT: 40.3 % (ref 34.8–46.6)
HGB: 13.7 g/dL (ref 11.6–15.9)
MCH: 30.3 pg (ref 25.1–34.0)
MONO#: 0.8 10*3/uL (ref 0.1–0.9)
NEUT#: 4.4 10*3/uL (ref 1.5–6.5)
NEUT%: 63.9 % (ref 38.4–76.8)
WBC: 6.9 10*3/uL (ref 3.9–10.3)
lymph#: 1.7 10*3/uL (ref 0.9–3.3)

## 2012-10-23 MED ORDER — VALSARTAN-HYDROCHLOROTHIAZIDE 160-25 MG PO TABS: ORAL_TABLET | ORAL | Status: DC

## 2012-10-23 NOTE — Telephone Encounter (Signed)
appts made and printed...td 

## 2012-11-08 NOTE — Progress Notes (Signed)
OFFICE PROGRESS NOTE  CC  Brooke Linger, MD [address] [facility] [address] [place] [place] [address] Dr. Chevis Pretty Dr. 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  PRIOR THERAPY:  #1 patient was originally seen in the multidisciplinary breast clinic on 03/11/2012 with new diagnosis of a palpable mass in the right breast measuring 2.9 cm on mammogram. Her MRI revealed the mass to be 2.7 cm. Core needle biopsy showed invasive lobular carcinoma ER positive PR positive HER-2/neu negative with a low Ki-67.  #2 she has now gone on to have a lumpectomy with sentinel lymph node biopsy. Her final pathology reveals a 2.8 cm invasive tubular lobular carcinoma grade 2 one sentinel node has isolated tumor cells. Tumor is ER +99% PR +4% HER-2/neu negative Ki-67 60%. Pathologic stage T2N(Wood).  #3 patient had an Oncotype DX performed her score was 10 giving her a 7% risk of distance recurrence with tamoxifen for 5 years. Wood have discussed this in detail with her  #4 patient is currently receiving radiation therapy she is tolerating it well. She will be finishing this up on April 30.  #5 once patient completes radiation Wood will give her tamoxifen 20 mg daily adjuvantly for duration of 10 years.  CURRENT THERAPY:  tamoxifen 20 mg daily starting may 2014 INTERVAL HISTORY: Brooke Wood 47 y.o. female returns for followup visit. Clinically patient seems to be doing well without any problems. She has no nausea vomiting fevers chills night sweats. Her right breast wound still is a little tender. Remainder of the 10 point review of systems is negative  MEDICAL HISTORY: Past Medical History  Diagnosis Date  . Allergy     Neg RAST 2007  . Hypertension   . Vocal cord dysfunction     Neg Methacholine challenge test 2007  . Breast cancer     right breast  . Hx of  radiation therapy 06/15/12 - 07/29/12    right rbeast, 50.4 Gy x 28 fx, boost to cumulative dose 60.4 gray    ALLERGIES:  is allergic to aspirin; codeine; and codeine phosphate.  MEDICATIONS:  Current Outpatient Prescriptions  Medication Sig Dispense Refill  . tamoxifen (NOLVADEX) 20 MG tablet Take 20 mg by mouth daily.      . valsartan-hydrochlorothiazide (DIOVAN-HCT) 160-25 MG per tablet Take 1 tab QD  90 tablet  3   No current facility-administered medications for this visit.    SURGICAL HISTORY:  Past Surgical History  Procedure Laterality Date  . Breast surgery  1985    rt br bx  . Breast lumpectomy with sentinel lymph node biopsy  04/09/2012    Procedure: BREAST LUMPECTOMY WITH SENTINEL LYMPH NODE BX;  Surgeon: Robyne Askew, MD;  Location: [facility];  Service: General;  Laterality: Right;    REVIEW OF SYSTEMS:  Pertinent items are noted in HPI.   HEALTH MAINTENANCE:   PHYSICAL EXAMINATION: Blood pressure 129/91, pulse 77, temperature 98 F (36.7 C), temperature source Oral, resp. rate 20, height 5\' 5"  (1.651 m), weight 156 lb 1.6 oz (70.806 kg). Body mass index is 25.98 kg/(m^2). ECOG PERFORMANCE STATUS: 0 - Asymptomatic   General appearance: alert, cooperative and appears stated age Lymph nodes: Cervical, supraclavicular, and axillary nodes normal. Resp: clear to auscultation bilaterally Back: symmetric, no curvature. ROM normal. No CVA tenderness. Cardio: regular rate and rhythm  GI: soft, non-tender; bowel sounds normal; no masses,  no organomegaly Extremities: extremities normal, atraumatic, no cyanosis or edema Neurologic: Grossly normal   LABORATORY DATA: Lab Results  Component Value Date   WBC 6.9 10/23/2012   HGB 13.7 10/23/2012   HCT 40.3 10/23/2012   MCV 89.1 10/23/2012   PLT 235 10/23/2012      Chemistry      Component Value Date/Time   NA 138 10/23/2012 0923   NA 139 04/09/2012 0705   K 3.6 10/23/2012 0923   K 4.7 04/09/2012 0705   CL  98 07/24/2012 1248   CL 98 04/09/2012 0705   CO2 24 10/23/2012 0923   CO2 27 07/19/2010 1022   BUN 11.6 10/23/2012 0923   BUN 18 04/09/2012 0705   CREATININE 0.7 10/23/2012 0923   CREATININE 0.80 04/09/2012 0705      Component Value Date/Time   CALCIUM 9.8 10/23/2012 0923   CALCIUM 9.7 07/19/2010 1022   ALKPHOS 94 10/23/2012 0923   AST 10 10/23/2012 0923   ALT 11 10/23/2012 0923   BILITOT 0.37 10/23/2012 0923     ADDITIONAL INFORMATION: 1. CHROMOGENIC IN-SITU HYBRIDIZATION Interpretation HER-2/NEU BY CISH - NO AMPLIFICATION OF HER-2 DETECTED. THE RATIO OF HER-2: CEP 17 SIGNALS WAS 1.10. H. Hollice Espy MD Pathologist, Electronic Signature ( Signed 04/16/2012) FINAL DIAGNOSIS Diagnosis 1. Breast, lumpectomy, right - INVASIVE TUBULO-LOBULAR CARCINOMA, GRADE II/III, SPANNING 2.8 CM. 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 - THE SURGICAL RESECTION MARGINS ARE NEGATIVE FOR INVASIVE CARCINOMA. - SEE ONCOLOGY TABLE BELOW. 2. Lymph node, sentinel, biopsy, Right #1 - ISOLATED TUMOR CELL(S) IN 1 OF 1 LYMPH NODE. - SEE COMMENT. 3. Breast, excision, Right-new inferior margin - BENIGN BREAST PARENCHYMA. - THERE IS NO EVIDENCE OF MALIGNANCY. - SEE COMMENT. Microscopic Comment 1. BREAST, INVASIVE TUMOR, WITH LYMPH NODE SAMPLING Specimen, including laterality: Right breast 1 of 3 FINAL for Brooke Wood ([id number]) Microscopic Comment(continued) Procedure: Lumpectomy Grade: II Tubule formation: 3 Nuclear pleomorphism: 2 Mitotic: 1 Tumor size (gross measurement): 2.8 cm Margins: Negative for carcinoma Invasive, distance to closest margin: 0.5 cm to the inferior margin of specimen #1 (gross measurement) Lymphovascular invasion: Not identified Ductal carcinoma in situ: Not identified Tumor focality: Unifocal Treatment effect: N/A Extent of tumor: Confined to breast parenchyma. Lymph nodes: # examined: 1 Lymph nodes with metastasis: 0 Isolated tumor cells (< 0.2 mm):  1 Micrometastasis: (> 0.2 mm and < 2.0 mm): 0 Macrometastasis: (> 2.0 mm): 0 Extracapsular extension: Not identified. Breast prognostic profile: Case [id number] Estrogen receptor: 99%, strong staining intensity Progesterone receptor: 84%, strong staining intensity Her 2 neu: No amplification was detected. The ratio was 1.30. Her 2 neu by CISH will be repeated on the current case and the results reported separately. Ki-67: 16% Non-neoplastic breast: Healing biopsy site TNM: pT2, pN0 (Wood+) Comments: Immunohistochemical stains performed on the lymph node reveal cytokeratin positive isolated tumor cell(s). Most of the invasive cells are negative for E-Cadherin, supporting a lobular phenotype. However, within the 2.8 cm tumor are distinct areas of phenotypically dissimilar invasive carcinoma cells consisting of well formed glands. These components are strongly positive for E-Cadherin, supporting a component of ductal carcinoma. Overall, Wood believe this invasive tumor is best phenotyped as an invasive tubulo-lobular carcinoma. 3. The surgical resection margin(s) of the specimen were inked and microscopically evaluated.  RADIOGRAPHIC STUDIES:  Nm Sentinel Node Inj-no Rpt (breast)  04/09/2012  CLINICAL DATA: right breast cancer   Sulfur colloid was injected intradermally by the  nuclear medicine  technologist for breast cancer sentinel node localization.      ASSESSMENT: 47 year old female with  #1 stage II (T2 N(Wood)) invasive tubular lobular carcinoma patient is status post lumpectomy in January 2014. She is doing well postop. We will send off an Oncotype DX testing on her. She and Wood discussed the rationale for this today. She will also be referred to radiation oncology  #2 patient had an Oncotype DX testing her breast cancer recurrence score is 10 giving her about 7% risk of distance recurrence with tamoxifen for 5 years. Wood have discussed this with the patient. Plan will be eventually to put  her on tamoxifen since she is premenopausal. There is new data regarding use of tamoxifen for 10 years we also discussed this.  #3patient will finish her radiation on April 30. Upon completion of this she will begin tamoxifen 20 mg daily. Prescription in for this was given to her. Risks and benefits and side effects of tamoxifen were discussed with her. Duration of therapy was also discussed with her which will be for 10 years.  PLAN:  #1 patient is tolerating tamoxifen 20 mg daily since she began this in May 2014.  #2she will now be seen back in 6 months time for followup.  All questions were answered. The patient knows to call the clinic with any problems, questions or concerns. We can certainly see the patient much sooner if necessary.  Wood spent 15 minutes counseling the patient face to face. The total time spent in the appointment was 30 minutes.    Drue Second, MD Medical/Oncology [facility] [phone] (beeper) [phone] (Office)

## 2012-11-24 ENCOUNTER — Ambulatory Visit (INDEPENDENT_AMBULATORY_CARE_PROVIDER_SITE_OTHER): Payer: Medicaid Other | Admitting: Internal Medicine

## 2012-11-24 ENCOUNTER — Other Ambulatory Visit (INDEPENDENT_AMBULATORY_CARE_PROVIDER_SITE_OTHER): Payer: Medicaid Other

## 2012-11-24 ENCOUNTER — Encounter: Payer: Self-pay | Admitting: Internal Medicine

## 2012-11-24 VITALS — BP 130/90 | HR 93 | Temp 98.3°F | Resp 16 | Wt 157.4 lb

## 2012-11-24 DIAGNOSIS — E785 Hyperlipidemia, unspecified: Secondary | ICD-10-CM

## 2012-11-24 DIAGNOSIS — E049 Nontoxic goiter, unspecified: Secondary | ICD-10-CM

## 2012-11-24 DIAGNOSIS — R7309 Other abnormal glucose: Secondary | ICD-10-CM

## 2012-11-24 DIAGNOSIS — I10 Essential (primary) hypertension: Secondary | ICD-10-CM

## 2012-11-24 DIAGNOSIS — E876 Hypokalemia: Secondary | ICD-10-CM | POA: Insufficient documentation

## 2012-11-24 DIAGNOSIS — IMO0002 Reserved for concepts with insufficient information to code with codable children: Secondary | ICD-10-CM

## 2012-11-24 DIAGNOSIS — M1710 Unilateral primary osteoarthritis, unspecified knee: Secondary | ICD-10-CM | POA: Insufficient documentation

## 2012-11-24 LAB — HEMOGLOBIN A1C: Hgb A1c MFr Bld: 6.1 % (ref 4.6–6.5)

## 2012-11-24 LAB — BASIC METABOLIC PANEL
CO2: 29 mEq/L (ref 19–32)
Calcium: 9.6 mg/dL (ref 8.4–10.5)
Chloride: 99 mEq/L (ref 96–112)
Sodium: 134 mEq/L — ABNORMAL LOW (ref 135–145)

## 2012-11-24 LAB — TSH: TSH: 1.23 u[IU]/mL (ref 0.35–5.50)

## 2012-11-24 LAB — LIPID PANEL
Cholesterol: 167 mg/dL (ref 0–200)
HDL: 52.7 mg/dL (ref >39.00)
VLDL: 24.6 mg/dL (ref 0.0–40.0)

## 2012-11-24 MED ORDER — TRAMADOL HCL 50 MG PO TABS: 50.0000 mg | ORAL_TABLET | Freq: Three times a day (TID) | ORAL | Status: DC | PRN

## 2012-11-24 MED ORDER — POTASSIUM CHLORIDE CRYS ER 20 MEQ PO TBCR: 20.0000 meq | EXTENDED_RELEASE_TABLET | Freq: Three times a day (TID) | ORAL | Status: DC

## 2012-11-24 NOTE — Patient Instructions (Signed)

## 2012-11-24 NOTE — Assessment & Plan Note (Signed)
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 

## 2012-11-24 NOTE — Assessment & Plan Note (Signed)
Exam is normal today TSH is normal

## 2012-11-24 NOTE — Assessment & Plan Note (Signed)
Goal achieved 

## 2012-11-24 NOTE — Progress Notes (Signed)
  Subjective:    Patient ID: Brooke Wood, female    DOB: June 08, 1965, 47 y.o.   MRN: [id number]  Arthritis Presents for follow-up visit. The disease course has been fluctuating. The condition has lasted for 1 year. She complains of pain. She reports no stiffness, joint swelling or joint warmth. Affected locations include the left knee and right knee. Her pain is at a severity of 2/10. Pertinent negatives include no diarrhea, dry eyes, dry mouth, dysuria, fatigue, fever, pain at night, pain while resting, rash, Raynaud's syndrome, uveitis or weight loss. Her pertinent risk factors include overuse. Past treatments include nothing. Factors aggravating her arthritis include activity.      Review of Systems  Constitutional: Negative.  Negative for fever, chills, weight loss, diaphoresis, appetite change and fatigue.  HENT: Negative.   Eyes: Negative.   Respiratory: Negative.  Negative for cough, chest tightness, shortness of breath, wheezing and stridor.   Cardiovascular: Negative.  Negative for chest pain, palpitations and leg swelling.  Gastrointestinal: Negative.  Negative for nausea, vomiting, abdominal pain, diarrhea, constipation and blood in stool.  Endocrine: Negative.   Genitourinary: Negative.  Negative for dysuria.  Musculoskeletal: Positive for arthralgias and arthritis. Negative for back pain, joint swelling, gait problem and stiffness.  Skin: Negative.  Negative for color change, pallor, rash and wound.  Allergic/Immunologic: Negative.   Neurological: Negative.  Negative for dizziness.  Hematological: Negative.  Negative for adenopathy. Does not bruise/bleed easily.  Psychiatric/Behavioral: Negative.        Objective:   Physical Exam  Vitals reviewed. Constitutional: She is oriented to person, place, and time. She appears well-developed and well-nourished. No distress.  HENT:  Head: Normocephalic and atraumatic.  Mouth/Throat: Oropharynx is clear and moist. No oropharyngeal  exudate.  Eyes: Conjunctivae are normal. Right eye exhibits no discharge. Left eye exhibits no discharge. No scleral icterus.  Neck: Normal range of motion. Neck supple. No JVD present. No tracheal deviation present. No thyromegaly present.  Cardiovascular: Normal rate, regular rhythm, normal heart sounds and intact distal pulses.  Exam reveals no gallop and no friction rub.   No murmur heard. Pulmonary/Chest: Effort normal and breath sounds normal. No stridor. No respiratory distress. She has no wheezes. She has no rales. She exhibits no tenderness.  Abdominal: Soft. Bowel sounds are normal. She exhibits no distension and no mass. There is no tenderness. There is no rebound and no guarding.  Musculoskeletal: Normal range of motion. She exhibits no edema and no tenderness.       Right knee: Normal.       Left knee: Normal.  Lymphadenopathy:    She has no cervical adenopathy.  Neurological: She is oriented to person, place, and time.  Skin: Skin is warm and dry. No rash noted. She is not diaphoretic. No erythema. No pallor.  Psychiatric: She has a normal mood and affect. Her behavior is normal. Judgment and thought content normal.     Lab Results  Component Value Date   WBC 6.9 10/23/2012   HGB 13.7 10/23/2012   HCT 40.3 10/23/2012   PLT 235 10/23/2012   GLUCOSE 98 10/23/2012   ALT 11 10/23/2012   AST 10 10/23/2012   NA 138 10/23/2012   K 3.6 10/23/2012   CL 98 07/24/2012   CREATININE 0.7 10/23/2012   BUN 11.6 10/23/2012   CO2 24 10/23/2012   TSH 1.53 07/19/2010       Assessment & Plan:

## 2012-11-24 NOTE — Assessment & Plan Note (Signed)
Her BP is well controlled Lytes show low K+ so that will be treated

## 2012-11-24 NOTE — Assessment & Plan Note (Signed)
She tells me that she is allergic to nsaids So will try tramadol for the pain

## 2013-01-18 ENCOUNTER — Ambulatory Visit (INDEPENDENT_AMBULATORY_CARE_PROVIDER_SITE_OTHER): Payer: Medicaid Other | Admitting: Internal Medicine

## 2013-01-18 ENCOUNTER — Encounter: Payer: Self-pay | Admitting: Internal Medicine

## 2013-01-18 VITALS — BP 124/84 | HR 96 | Temp 97.3°F | Ht 65.0 in | Wt 157.2 lb

## 2013-01-18 DIAGNOSIS — H6980 Other specified disorders of Eustachian tube, unspecified ear: Secondary | ICD-10-CM

## 2013-01-18 DIAGNOSIS — H6120 Impacted cerumen, unspecified ear: Secondary | ICD-10-CM

## 2013-01-18 DIAGNOSIS — H6981 Other specified disorders of Eustachian tube, right ear: Secondary | ICD-10-CM

## 2013-01-18 DIAGNOSIS — H6122 Impacted cerumen, left ear: Secondary | ICD-10-CM

## 2013-01-18 MED ORDER — METHYLPREDNISOLONE ACETATE 80 MG/ML IJ SUSP: 80.0000 mg | Freq: Once | INTRAMUSCULAR | Status: DC

## 2013-01-18 NOTE — Addendum Note (Signed)
Addended by: Bethann Punches E on: 01/18/2013 02:22 PM   Modules accepted: Orders

## 2013-01-18 NOTE — Patient Instructions (Signed)

## 2013-01-18 NOTE — Progress Notes (Signed)
Subjective:    Patient ID: Brooke Wood, female    DOB: [birth year], 47 y.o.   MRN: [id number]  HPI  Pt presents to the clinic today with c/o right ear pain and dizziness. This started 2 days ago. She denies fever, chills or body aches. She denies chest pain, chest tightness or shortness of breath. She has not tried anything OTC. She does have a history of allergies but has not had sick contacts that she is aware of.  Review of Systems      Past Medical History  Diagnosis Date  . Allergy     Neg RAST 2007  . Hypertension   . Vocal cord dysfunction     Neg Methacholine challenge test 2007  . Breast cancer     right breast  . Hx of radiation therapy 06/15/12 - 07/29/12    right rbeast, 50.4 Gy x 28 fx, boost to cumulative dose 60.4 gray    Current Outpatient Prescriptions  Medication Sig Dispense Refill  . potassium chloride SA (K-DUR,KLOR-CON) 20 MEQ tablet Take 1 tablet (20 mEq total) by mouth 3 (three) times daily.  90 tablet  3  . tamoxifen (NOLVADEX) 20 MG tablet Take 20 mg by mouth daily.      . traMADol (ULTRAM) 50 MG tablet Take 1 tablet (50 mg total) by mouth every 8 (eight) hours as needed for pain.  60 tablet  5  . valsartan-hydrochlorothiazide (DIOVAN-HCT) 160-25 MG per tablet Take 1 tab QD  90 tablet  3   No current facility-administered medications for this visit.    Allergies  Allergen Reactions  . Aspirin   . Codeine   . Codeine Phosphate     REACTION: unspecified    Family History  Problem Relation Age of Onset  . Hypertension Other   . Alcohol abuse Neg Hx   . Diabetes Neg Hx   . Early death Neg Hx   . Hearing loss Neg Hx   . Heart disease Neg Hx   . Hyperlipidemia Neg Hx   . Stroke Neg Hx   . Kidney disease Neg Hx   . Lung cancer Father 93    smoker  . Cancer Father     lung    History   Social History  . Marital Status: Single    Spouse Name: N/A    Number of Children: N/A  . Years of Education: N/A   Occupational History  .     [employer]   Social History Main Topics  . Smoking status: Never Smoker   . Smokeless tobacco: Never Used  . Alcohol Use: 2.4 oz/week    4 Glasses of wine per week  . Drug Use: No  . Sexual Activity: Yes    Birth Control/ Protection: Pill   Other Topics Concern  . Not on file   Social History Narrative   Regular exercise-No     Constitutional: Denies fever, malaise, fatigue, headache or abrupt weight changes.  HEENT: Pt reports right ear pain. Denies eye pain, eye redness, ear pain, ringing in the ears, wax buildup, runny nose, nasal congestion, bloody nose, or sore throat. Respiratory: Denies difficulty breathing, shortness of breath, cough or sputum production.   Cardiovascular: Denies chest pain, chest tightness, palpitations or swelling in the hands or feet.  Neurological: Pt reports dizziness. Denies dizziness, difficulty with memory, difficulty with speech or problems with balance and coordination.   No other specific complaints in a complete review of systems (except as listed  in HPI above).  Objective:   Physical Exam   BP 124/84  Pulse 96  Temp(Src) 97.3 F (36.3 C) (Oral)  Ht 5\' 5"  (1.651 m)  Wt 157 lb 4 oz (71.328 kg)  BMI 26.17 kg/m2  SpO2 97% Wt Readings from Last 3 Encounters:  01/18/13 157 lb 4 oz (71.328 kg)  11/24/12 157 lb 6 oz (71.385 kg)  10/23/12 156 lb 1.6 oz (70.806 kg)    General: Appears her stated age, well developed, well nourished in NAD. HEENT: Head: normal shape and size; Eyes: sclera white, no icterus, conjunctiva pink, PERRLA and EOMs intact; Right Ear: Tm's gray and intact, normal light reflex, + effusion; Left Ear: cerumen impaction. Nose: mucosa pink and moist, septum midline; Throat/Mouth: Teeth present, mucosa pink and moist, no exudate, lesions or ulcerations noted.   Cardiovascular: Normal rate and rhythm. S1,S2 noted.  No murmur, rubs or gallops noted. No JVD or BLE edema. No carotid bruits noted. Pulmonary/Chest: Normal  effort and positive vesicular breath sounds. No respiratory distress. No wheezes, rales or ronchi noted.  Neurological: Alert and oriented. Cranial nerves II-XII intact. Coordination normal. +DTRs bilaterally.   BMET    Component Value Date/Time   NA 134* 11/24/2012 1040   NA 138 10/23/2012 0923   K 3.3* 11/24/2012 1040   K 3.6 10/23/2012 0923   CL 99 11/24/2012 1040   CL 98 07/24/2012 1248   CO2 29 11/24/2012 1040   CO2 24 10/23/2012 0923   GLUCOSE 90 11/24/2012 1040   GLUCOSE 98 10/23/2012 0923   GLUCOSE 158* 07/24/2012 1248   BUN 16 11/24/2012 1040   BUN 11.6 10/23/2012 0923   CREATININE 0.6 11/24/2012 1040   CREATININE 0.7 10/23/2012 0923   CALCIUM 9.6 11/24/2012 1040   CALCIUM 9.8 10/23/2012 0923   GFRNONAA >90 03/10/2012 0730   GFRAA >90 03/10/2012 0730    Lipid Panel     Component Value Date/Time   CHOL 167 11/24/2012 1040   TRIG 123.0 11/24/2012 1040   HDL 52.70 11/24/2012 1040   CHOLHDL 3 11/24/2012 1040   VLDL 24.6 11/24/2012 1040   LDLCALC 90 11/24/2012 1040    CBC    Component Value Date/Time   WBC 6.9 10/23/2012 0923   WBC 7.2 07/19/2010 1022   RBC 4.52 10/23/2012 0923   RBC 4.38 07/19/2010 1022   HGB 13.7 10/23/2012 0923   HGB 14.6 04/09/2012 0705   HCT 40.3 10/23/2012 0923   HCT 43.0 04/09/2012 0705   PLT 235 10/23/2012 0923   PLT 256.0 07/19/2010 1022   MCV 89.1 10/23/2012 0923   MCV 89.7 07/19/2010 1022   MCH 30.3 10/23/2012 0923   MCHC 34.0 10/23/2012 0923   MCHC 34.1 07/19/2010 1022   RDW 13.3 10/23/2012 0923   RDW 14.2 07/19/2010 1022   LYMPHSABS 1.7 10/23/2012 0923   LYMPHSABS 2.8 07/19/2010 1022   MONOABS 0.8 10/23/2012 0923   MONOABS 0.6 07/19/2010 1022   EOSABS 0.0 10/23/2012 0923   EOSABS 0.0 07/19/2010 1022   BASOSABS 0.0 10/23/2012 0923   BASOSABS 0.0 07/19/2010 1022    Hgb A1C Lab Results  Component Value Date   HGBA1C 6.1 11/24/2012        Assessment & Plan:   Right ear pain and dizziness secondary to eustachian tube dysfunction:  Take zyrtec OTC daily 80  mg Depo IM today  Cerumen impaction, left:  Lavage by CMA Can use OTC debrox solution once weekly  RTC as needed or if symptoms persist or  worsen

## 2013-01-19 ENCOUNTER — Ambulatory Visit (INDEPENDENT_AMBULATORY_CARE_PROVIDER_SITE_OTHER): Payer: Medicaid Other | Admitting: General Surgery

## 2013-01-19 ENCOUNTER — Encounter (INDEPENDENT_AMBULATORY_CARE_PROVIDER_SITE_OTHER): Payer: Self-pay | Admitting: General Surgery

## 2013-01-19 VITALS — BP 118/70 | HR 68 | Resp 14 | Ht 65.0 in | Wt 155.8 lb

## 2013-01-19 DIAGNOSIS — Z853 Personal history of malignant neoplasm of breast: Secondary | ICD-10-CM

## 2013-01-19 DIAGNOSIS — C50411 Malignant neoplasm of upper-outer quadrant of right female breast: Secondary | ICD-10-CM

## 2013-01-19 NOTE — Patient Instructions (Signed)
Continue regular self exams Continue tamoxifen 

## 2013-01-29 ENCOUNTER — Telehealth (INDEPENDENT_AMBULATORY_CARE_PROVIDER_SITE_OTHER): Payer: Self-pay

## 2013-01-29 ENCOUNTER — Encounter (INDEPENDENT_AMBULATORY_CARE_PROVIDER_SITE_OTHER): Payer: Self-pay | Admitting: General Surgery

## 2013-01-29 ENCOUNTER — Ambulatory Visit (INDEPENDENT_AMBULATORY_CARE_PROVIDER_SITE_OTHER): Payer: Medicaid Other | Admitting: General Surgery

## 2013-01-29 ENCOUNTER — Telehealth: Payer: Self-pay | Admitting: Oncology

## 2013-01-29 VITALS — BP 122/72 | HR 80 | Temp 98.8°F | Resp 15 | Ht 65.0 in | Wt 154.4 lb

## 2013-01-29 DIAGNOSIS — N61 Mastitis without abscess: Secondary | ICD-10-CM

## 2013-01-29 MED ORDER — CEPHALEXIN 500 MG PO CAPS: 500.0000 mg | ORAL_CAPSULE | Freq: Four times a day (QID) | ORAL | Status: DC

## 2013-01-29 NOTE — Telephone Encounter (Signed)
error 

## 2013-01-29 NOTE — Progress Notes (Signed)
Chief Complaint  Patient presents with  . Follow-up    eval right breast / red and warm to touch    HISTORY:  Brooke Wood is a 47 y.o. female who presents to clinic with a new onset Right breast erythema.  Other symptoms include itching and burning.  This has occurred for the last 24 hrs. She denies fevers or breast pain.  She is s/p lumpectomy in that breast with Dr Carolynne Edouard 9 months ago.  She is s/p radiation treatment in April and May.  She denies having anything like this before.  Past Medical History  Diagnosis Date  . Allergy     Neg RAST 2007  . Hypertension   . Vocal cord dysfunction     Neg Methacholine challenge test 2007  . Breast cancer     right breast  . Hx of radiation therapy 06/15/12 - 07/29/12    right rbeast, 50.4 Gy x 28 fx, boost to cumulative dose 60.4 gray       Past Surgical History  Procedure Laterality Date  . Breast surgery  1985    rt br bx  . Breast lumpectomy with sentinel lymph node biopsy  04/09/2012    Procedure: BREAST LUMPECTOMY WITH SENTINEL LYMPH NODE BX;  Surgeon: Robyne Askew, MD;  Location:  [facility];  Service: General;  Laterality: Right;      Current Outpatient Prescriptions  Medication Sig Dispense Refill  . cephALEXin (KEFLEX) 500 MG capsule Take 1 capsule (500 mg total) by mouth 4 (four) times daily.  40 capsule  0  . potassium chloride SA (K-DUR,KLOR-CON) 20 MEQ tablet Take 1 tablet (20 mEq total) by mouth 3 (three) times daily.  90 tablet  3  . tamoxifen (NOLVADEX) 20 MG tablet Take 20 mg by mouth daily.      . traMADol (ULTRAM) 50 MG tablet Take 1 tablet (50 mg total) by mouth every 8 (eight) hours as needed for pain.  60 tablet  5  . valsartan-hydrochlorothiazide (DIOVAN-HCT) 160-25 MG per tablet Take 1 tab QD  90 tablet  3   Current Facility-Administered Medications  Medication Dose Route Frequency Provider Last Rate Last Dose  . methylPREDNISolone acetate (DEPO-MEDROL) injection 80 mg  80 mg Intramuscular Once  Nicki Reaper, NP         Allergies  Allergen Reactions  . Aspirin   . Codeine   . Codeine Phosphate     REACTION: unspecified      Family History  Problem Relation Age of Onset  . Hypertension Other   . Alcohol abuse Neg Hx   . Diabetes Neg Hx   . Early death Neg Hx   . Hearing loss Neg Hx   . Heart disease Neg Hx   . Hyperlipidemia Neg Hx   . Stroke Neg Hx   . Kidney disease Neg Hx   . Lung cancer Father 74    smoker  . Cancer Father     lung      History   Social History  . Marital Status: Single    Spouse Name: N/A    Number of Children: N/A  . Years of Education: N/A   Occupational History  .      [employer]   Social History Main Topics  . Smoking status: Never Smoker   . Smokeless tobacco: Never Used  . Alcohol Use: 2.4 oz/week    4 Glasses of wine per week  . Drug Use: No  .  Sexual Activity: Yes    Birth Control/ Protection: Pill   Other Topics Concern  . None   Social History Narrative   Regular exercise-No       REVIEW OF SYSTEMS - PERTINENT POSITIVES ONLY: Review of Systems - General ROS: negative for - chills or fever Breast ROS: negative for breast lumps positive for - erythema negative for - nipple discharge Respiratory ROS: no cough, shortness of breath, or wheezing Cardiovascular ROS: no chest pain or dyspnea on exertion Gastrointestinal ROS: no abdominal pain, change in bowel habits, or black or bloody stools Genito-Urinary ROS: no dysuria, trouble voiding, or hematuria  EXAM: Filed Vitals:   01/29/13 1537  BP: 122/72  Pulse: 80  Temp: 98.8 F (37.1 C)  Resp: 15    General appearance: alert and cooperative Resp: clear to auscultation bilaterally Cardio: regular rate and rhythm GI: normal findings: soft, non-tender Breast: R breast with erythema of the whole breast with no fluctuant masses, dependent edema noted  ASSESSMENT AND PLAN: Brooke Wood is a 47 y.o. F that is s/p lumpectomy and radiation therapy this year  for breast cancer.  She remains on Tamoxifen only now.  She presents to the office with what appears to be mastitis.  I have prescribed her a 10 day course of Keflex.  She will follow up with Dr Carolynne Edouard next week.      Brooke Panda, MD Colon and Rectal Surgery / General Surgery [facility]      Visit Diagnoses: 1. Mastitis, right, acute     Primary Care Physician: Brooke Linger, MD

## 2013-01-29 NOTE — Patient Instructions (Signed)
Take the antibiotics as prescribed.  Call the office if your symptoms get worse.

## 2013-01-29 NOTE — Telephone Encounter (Signed)
Brooke Wood called and said that her right breast is red and warm to the touch.  She said it started this morning.  It is not painful.  She has also called her surgeon's office and is going to be seen today by Dr. Maisie Fus.  Adrian Prows that Dr. Roselind Messier is out of the office today but will be notified.

## 2013-02-01 ENCOUNTER — Ambulatory Visit: Payer: Medicaid Other | Admitting: Internal Medicine

## 2013-02-02 ENCOUNTER — Encounter (INDEPENDENT_AMBULATORY_CARE_PROVIDER_SITE_OTHER): Payer: Self-pay | Admitting: General Surgery

## 2013-02-02 ENCOUNTER — Encounter (INDEPENDENT_AMBULATORY_CARE_PROVIDER_SITE_OTHER): Payer: Medicaid Other | Admitting: General Surgery

## 2013-02-02 ENCOUNTER — Ambulatory Visit (INDEPENDENT_AMBULATORY_CARE_PROVIDER_SITE_OTHER): Payer: Medicaid Other | Admitting: General Surgery

## 2013-02-02 VITALS — BP 138/80 | HR 74 | Temp 97.6°F | Resp 16 | Ht 65.0 in | Wt 157.0 lb

## 2013-02-02 DIAGNOSIS — C50419 Malignant neoplasm of upper-outer quadrant of unspecified female breast: Secondary | ICD-10-CM

## 2013-02-02 DIAGNOSIS — C50411 Malignant neoplasm of upper-outer quadrant of right female breast: Secondary | ICD-10-CM

## 2013-02-02 NOTE — Patient Instructions (Signed)
Finish the abx that you have

## 2013-02-03 ENCOUNTER — Encounter: Payer: Self-pay | Admitting: Internal Medicine

## 2013-02-03 ENCOUNTER — Ambulatory Visit (INDEPENDENT_AMBULATORY_CARE_PROVIDER_SITE_OTHER): Payer: Medicaid Other | Admitting: Internal Medicine

## 2013-02-03 VITALS — BP 124/76 | HR 95 | Temp 98.2°F | Resp 16 | Ht 65.0 in | Wt 159.0 lb

## 2013-02-03 DIAGNOSIS — Z23 Encounter for immunization: Secondary | ICD-10-CM

## 2013-02-03 DIAGNOSIS — I10 Essential (primary) hypertension: Secondary | ICD-10-CM

## 2013-02-03 DIAGNOSIS — E876 Hypokalemia: Secondary | ICD-10-CM

## 2013-02-03 NOTE — Progress Notes (Signed)
  Subjective:    Patient ID: Brooke Wood, female    DOB: 10/16/1965, 47 y.o.   MRN: [id number]  Hypertension This is a chronic problem. The current episode started more than 1 year ago. The problem has been gradually improving since onset. The problem is controlled. Associated symptoms include anxiety. Pertinent negatives include no blurred vision, chest pain, headaches, malaise/fatigue, neck pain, orthopnea, palpitations, peripheral edema, PND, shortness of breath or sweats. Past treatments include angiotensin blockers and diuretics. The current treatment provides moderate improvement. Compliance problems include exercise and diet.       Review of Systems  Constitutional: Negative.  Negative for fever, chills, malaise/fatigue, diaphoresis, appetite change and fatigue.  HENT: Negative.   Eyes: Negative.  Negative for blurred vision.  Respiratory: Negative.  Negative for cough, choking, chest tightness, shortness of breath, wheezing and stridor.   Cardiovascular: Negative.  Negative for chest pain, palpitations, orthopnea, leg swelling and PND.  Gastrointestinal: Negative.  Negative for nausea, vomiting, abdominal pain, diarrhea, constipation and blood in stool.  Endocrine: Negative.   Genitourinary: Negative.   Musculoskeletal: Negative.  Negative for arthralgias, back pain, gait problem, myalgias, neck pain and neck stiffness.  Skin: Negative.   Allergic/Immunologic: Negative.   Neurological: Negative.  Negative for dizziness, tremors, facial asymmetry, weakness, light-headedness and headaches.  Hematological: Negative.  Negative for adenopathy. Does not bruise/bleed easily.  Psychiatric/Behavioral: Negative.        Objective:   Physical Exam  Vitals reviewed. Constitutional: She is oriented to person, place, and time. She appears well-developed and well-nourished. No distress.  HENT:  Head: Normocephalic and atraumatic.  Mouth/Throat: Oropharynx is clear and moist. No  oropharyngeal exudate.  Eyes: Conjunctivae are normal. Right eye exhibits no discharge. Left eye exhibits no discharge. No scleral icterus.  Neck: Normal range of motion. Neck supple. No JVD present. No tracheal deviation present. No thyromegaly present.  Cardiovascular: Normal rate, regular rhythm, normal heart sounds and intact distal pulses.  Exam reveals no gallop and no friction rub.   No murmur heard. Pulmonary/Chest: Effort normal and breath sounds normal. No stridor. No respiratory distress. She has no wheezes. She has no rales. She exhibits no tenderness.  Abdominal: Soft. Bowel sounds are normal. She exhibits no distension and no mass. There is no tenderness. There is no rebound and no guarding.  Musculoskeletal: Normal range of motion. She exhibits no edema and no tenderness.  Lymphadenopathy:    She has no cervical adenopathy.  Neurological: She is oriented to person, place, and time.  Skin: Skin is warm and dry. No rash noted. She is not diaphoretic. No erythema. No pallor.  Psychiatric: She has a normal mood and affect. Her behavior is normal. Judgment and thought content normal.     Lab Results  Component Value Date   WBC 6.9 10/23/2012   HGB 13.7 10/23/2012   HCT 40.3 10/23/2012   PLT 235 10/23/2012   GLUCOSE 90 11/24/2012   CHOL 167 11/24/2012   TRIG 123.0 11/24/2012   HDL 52.70 11/24/2012   LDLCALC 90 11/24/2012   ALT 11 10/23/2012   AST 10 10/23/2012   NA 134* 11/24/2012   K 3.3* 11/24/2012   CL 99 11/24/2012   CREATININE 0.6 11/24/2012   BUN 16 11/24/2012   CO2 29 11/24/2012   TSH 1.23 11/24/2012   HGBA1C 6.1 11/24/2012       Assessment & Plan:

## 2013-02-03 NOTE — Assessment & Plan Note (Signed)
Her BP is well controlled 

## 2013-02-03 NOTE — Progress Notes (Signed)
Pre-visit discussion using our clinic review tool. No additional management support is needed unless otherwise documented below in the visit note.  

## 2013-02-03 NOTE — Patient Instructions (Signed)

## 2013-02-03 NOTE — Assessment & Plan Note (Signed)
I will recheck her K+ level today 

## 2013-02-11 LAB — HM PAP SMEAR: HM PAP: NORMAL

## 2013-02-12 NOTE — Progress Notes (Signed)
Subjective:     Patient ID: Brooke Wood, female   DOB: Aug 07, 1965, 47 y.o.   MRN: [id number]  HPI The patient is a 47 year old black female who is 10 months status post right lumpectomy and negative sentinel node biopsy for a T2 N0 right breast cancer. She has completed radiation therapy and is taking tamoxifen. She is tolerating this well. She has no complaints today. Her appetite is good and her bowels are working normally.  Review of Systems  Constitutional: Negative.   HENT: Negative.   Eyes: Negative.   Respiratory: Negative.   Cardiovascular: Negative.   Gastrointestinal: Negative.   Endocrine: Negative.   Genitourinary: Negative.   Musculoskeletal: Negative.   Skin: Negative.   Allergic/Immunologic: Negative.   Neurological: Negative.   Hematological: Negative.   Psychiatric/Behavioral: Negative.        Objective:   Physical Exam  Constitutional: She is oriented to person, place, and time. She appears well-developed and well-nourished.  HENT:  Head: Normocephalic and atraumatic.  Eyes: Conjunctivae and EOM are normal. Pupils are equal, round, and reactive to light.  Neck: Normal range of motion. Neck supple.  Cardiovascular: Normal rate, regular rhythm and normal heart sounds.   Pulmonary/Chest: Effort normal and breath sounds normal.  There is some radiation change to the skin of the right breast. There is no palpable mass in either breast. There is no palpable axillary, supraclavicular, or cervical lymphadenopathy.  The incisions on her right breast and axilla are healing nicely with no sign of infection  Abdominal: Soft. Bowel sounds are normal. She exhibits no mass. There is no tenderness.  Musculoskeletal: Normal range of motion.  Lymphadenopathy:    She has no cervical adenopathy.  Neurological: She is alert and oriented to person, place, and time.  Skin: Skin is warm and dry.  Psychiatric: She has a normal mood and affect. Her behavior is normal.        Assessment:     The patient is 10 months status post right lumpectomy for breast cancer     Plan:     At this point she will continue to do regular self exams. She will continue to take tamoxifen. I will plan to see her back in 6 months.

## 2013-02-17 ENCOUNTER — Ambulatory Visit (INDEPENDENT_AMBULATORY_CARE_PROVIDER_SITE_OTHER): Payer: Medicaid Other | Admitting: General Surgery

## 2013-02-17 ENCOUNTER — Encounter (INDEPENDENT_AMBULATORY_CARE_PROVIDER_SITE_OTHER): Payer: Self-pay | Admitting: General Surgery

## 2013-02-17 VITALS — BP 129/84 | HR 62 | Temp 98.1°F | Resp 14 | Ht 65.0 in | Wt 153.2 lb

## 2013-02-17 DIAGNOSIS — C50411 Malignant neoplasm of upper-outer quadrant of right female breast: Secondary | ICD-10-CM

## 2013-02-17 DIAGNOSIS — Z853 Personal history of malignant neoplasm of breast: Secondary | ICD-10-CM

## 2013-02-17 DIAGNOSIS — N6459 Other signs and symptoms in breast: Secondary | ICD-10-CM

## 2013-02-17 NOTE — Progress Notes (Signed)
Subjective:     Patient ID: Brooke Wood, female   DOB: 1965-05-15, 47 y.o.   MRN: [id number]  HPI The patient is a 47 year old white female who is 10 months status post right lumpectomy and negative sentinel node biopsy for a T2 N0 right breast cancer. She finished radiation therapy and is taking tamoxifen. She is tolerating this well. We saw her not long ago and she was doing well. Since her last visit she has developed some slight redness along her right upper chest wall. She denies any breast pain. She denies any fevers or chills.  Review of Systems  Constitutional: Negative.   HENT: Negative.   Eyes: Negative.   Respiratory: Negative.   Cardiovascular: Negative.   Gastrointestinal: Negative.   Endocrine: Negative.   Genitourinary: Negative.   Musculoskeletal: Negative.   Skin: Negative.   Allergic/Immunologic: Negative.   Neurological: Negative.   Hematological: Negative.   Psychiatric/Behavioral: Negative.        Objective:   Physical Exam  Constitutional: She is oriented to person, place, and time. She appears well-developed and well-nourished.  HENT:  Head: Normocephalic and atraumatic.  Eyes: Conjunctivae and EOM are normal. Pupils are equal, round, and reactive to light.  Neck: Normal range of motion. Neck supple.  Cardiovascular: Normal rate, regular rhythm and normal heart sounds.   Pulmonary/Chest: Effort normal and breath sounds normal.  Her right breast and axillary incision and healed nicely. There is no redness or tenderness to the right breast. There is no palpable mass in the right breast. The redness along the upper right chest wall is very faint. There does not appear to be any heat to the area. There is no palpable axillary, supraclavicular, or cervical lymphadenopathy  Abdominal: Soft. Bowel sounds are normal.  Musculoskeletal: Normal range of motion.  Lymphadenopathy:    She has no cervical adenopathy.  Neurological: She is alert and oriented to person,  place, and time.  Skin: Skin is warm and dry.  Psychiatric: She has a normal mood and affect. Her behavior is normal.       Assessment:     The patient is 10 months status post right lumpectomy for breast cancer with some redness along the right upper chest wall     Plan:     At this point I will give her a prescription for doxycycline to be filled only if the redness gets worse. Otherwise I will see her back in a couple weeks to check her progress.

## 2013-02-17 NOTE — Patient Instructions (Signed)
Fill antibiotic only if the redness gets worse

## 2013-02-17 NOTE — Progress Notes (Signed)
Subjective:     Patient ID: Brooke Wood, female   DOB: 1965/12/30, 47 y.o.   MRN: [id number]  HPI The patient is a 47 year old white female who is 9 months status post right breast lumpectomy and negative sentinel node biopsy for a T2 N0 right breast cancer. She finished radiation therapy in April. She is now taking tamoxifen and tolerating it well. She complains only of some soreness in the right breast.  Review of Systems  Constitutional: Negative.   HENT: Negative.   Eyes: Negative.   Respiratory: Negative.   Cardiovascular: Negative.   Gastrointestinal: Negative.   Endocrine: Negative.   Genitourinary: Negative.   Musculoskeletal: Negative.   Skin: Negative.   Allergic/Immunologic: Negative.   Neurological: Negative.   Hematological: Negative.   Psychiatric/Behavioral: Negative.        Objective:   Physical Exam  Constitutional: She is oriented to person, place, and time. She appears well-developed and well-nourished.  HENT:  Head: Normocephalic and atraumatic.  Eyes: Conjunctivae and EOM are normal. Pupils are equal, round, and reactive to light.  Neck: Normal range of motion. Neck supple.  Cardiovascular: Normal rate, regular rhythm and normal heart sounds.   Pulmonary/Chest: Effort normal and breath sounds normal.  There is thickening of the right breast secondary to radiation. The right breast and axillary incisions have healed nicely. There is some thickness to the scar secondary to radiation but otherwise there is no palpable mass in either breast. There is no palpable axillary, supraclavicular, or cervical lymphadenopathy.  Abdominal: Soft. Bowel sounds are normal. She exhibits no mass. There is no tenderness.  Musculoskeletal: Normal range of motion.  Lymphadenopathy:    She has no cervical adenopathy.  Neurological: She is alert and oriented to person, place, and time.  Skin: Skin is warm and dry.  Psychiatric: She has a normal mood and affect. Her behavior is  normal.       Assessment:     The patient is 9 months status post right breast lumpectomy for breast cancer     Plan:     At this point she will continue to take tamoxifen. She will continue to do regular self exams. I will plan to see her back in 6 months.

## 2013-02-26 ENCOUNTER — Ambulatory Visit: Payer: Medicaid Other | Admitting: Internal Medicine

## 2013-03-01 ENCOUNTER — Ambulatory Visit: Payer: Medicaid Other | Admitting: Internal Medicine

## 2013-03-01 ENCOUNTER — Ambulatory Visit: Payer: Medicaid Other | Admitting: Radiation Oncology

## 2013-03-04 ENCOUNTER — Encounter: Payer: Self-pay | Admitting: Radiation Oncology

## 2013-03-04 ENCOUNTER — Ambulatory Visit
Admission: RE | Admit: 2013-03-04 | Discharge: 2013-03-04 | Disposition: A | Discharge status: Home / Self Care | Payer: Medicaid Other | Source: Ambulatory Visit | Attending: Radiation Oncology | Admitting: Radiation Oncology

## 2013-03-04 VITALS — BP 143/93 | HR 93 | Temp 98.0°F | Ht 65.0 in | Wt 154.1 lb

## 2013-03-04 DIAGNOSIS — C50411 Malignant neoplasm of upper-outer quadrant of right female breast: Secondary | ICD-10-CM

## 2013-03-04 NOTE — Progress Notes (Signed)
  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  She denies any pain in the breast area nipple discharge or bleeding. Last month she noticed some redness in the breast and was placed on doxycycline by Gen. surgery. This cleared up her skin reaction. She denies any problems with swelling in her right arm. She is on tamoxifen.                              ALLERGIES:  is allergic to aspirin; codeine; and codeine phosphate.  Meds: Current Outpatient Prescriptions  Medication Sig Dispense Refill  . potassium chloride SA (K-DUR,KLOR-CON) 20 MEQ tablet Take 1 tablet (20 mEq total) by mouth 3 (three) times daily.  90 tablet  3  . tamoxifen (NOLVADEX) 20 MG tablet Take 20 mg by mouth daily.      . valsartan-hydrochlorothiazide (DIOVAN-HCT) 160-25 MG per tablet Take 1 tab QD  90 tablet  3  . traMADol (ULTRAM) 50 MG tablet Take 1 tablet (50 mg total) by mouth every 8 (eight) hours as needed for pain.  60 tablet  5   No current facility-administered medications for this encounter.    Physical Findings: The patient is in no acute distress. Patient is alert and oriented.  height is 5\' 5"  (1.651 m) and weight is 154 lb 1.6 oz (69.899 kg). Her temperature is 98 F (36.7 C). Her blood pressure is 143/93 and her pulse is 93. Marland Kitchen No palpable supraclavicular or axillary adenopathy. The lungs are clear to auscultation. The heart has a regular rhythm and rate summation of left breast reveals no mass or nipple discharge. Examination of the right breast reveals some hyperpigmentation changes. There is some edema in the breast. There is no dominant mass appreciated  breast nipple discharge or bleeding.  Lab Findings: Lab Results  Component Value Date   WBC 6.9 10/23/2012   HGB 13.7 10/23/2012   HCT 40.3 10/23/2012   MCV 89.1 10/23/2012   PLT 235 10/23/2012      Radiographic Findings: No results found.  Impression:  The patient is recovering from the effects of radiation.  No evidence of recurrence on clinical exam today  Plan:  Routine followup in July of 2015 in between Gen. surgery followup appointments.  _____________________________________ -----------------------------------  Billie Lade, PhD, MD

## 2013-03-04 NOTE — Progress Notes (Signed)
Brooke Wood here for follow up after treatment to her right breast.  She denies pain.  She reports that her fatigue is a little better.  She is currently working 4 hours per day.  The skin on her right breast has hyperpigmentation.  She reports that the infection she had is gone.  She reports that her skin is dry.  Recommended using cocoa butter or Vitamin E cream.   She is currently taking tamoxifen.  She is wondering what kind of birth control she can use due to her breast cancer.

## 2013-03-05 ENCOUNTER — Encounter (INDEPENDENT_AMBULATORY_CARE_PROVIDER_SITE_OTHER): Payer: Medicaid Other | Admitting: General Surgery

## 2013-03-05 ENCOUNTER — Ambulatory Visit: Payer: Medicaid Other | Admitting: Internal Medicine

## 2013-03-26 ENCOUNTER — Ambulatory Visit: Payer: Medicaid Other | Admitting: Internal Medicine

## 2013-04-02 ENCOUNTER — Other Ambulatory Visit: Payer: Self-pay | Admitting: Oncology

## 2013-04-02 DIAGNOSIS — Z853 Personal history of malignant neoplasm of breast: Secondary | ICD-10-CM

## 2013-04-08 ENCOUNTER — Telehealth: Payer: Self-pay | Admitting: Genetic Counselor

## 2013-04-08 ENCOUNTER — Encounter: Payer: Self-pay | Admitting: Genetic Counselor

## 2013-04-08 NOTE — Telephone Encounter (Signed)
Revealed that her NBN VUS is reclassified as benign.  We will send her an updated report.

## 2013-04-09 ENCOUNTER — Encounter: Payer: Self-pay | Admitting: *Deleted

## 2013-04-09 NOTE — Progress Notes (Unsigned)
[facility]  Clinical Social Work was referred by patient for assessment of psychosocial needs due to questions about her medicaid coverage.  Clinical Social Worker spoke with patient over the phone in attempt to answer her questions. Pt wondering why she had not received a medicaid re-certification letter for the new year. CSW explained that CSW can't see medicaid coverage specifics and asked Pt if she had a medicaid case worker she could phone. Pt then pulled out her card and saw the phone number of who to call on her card. Pt states she will call medicaid and seek further assistance. Pt agrees to call back if she has other issues.     Loren Racer, LCSW Clinical Social Worker [facility] [place] Wednesday, Thursday and Friday Phone: [phone] Fax: [phone]

## 2013-04-21 ENCOUNTER — Telehealth (INDEPENDENT_AMBULATORY_CARE_PROVIDER_SITE_OTHER): Payer: Self-pay

## 2013-04-21 NOTE — Telephone Encounter (Signed)
LMOM> Per Dr Marlou Starks she needs to get approved by [facility] before she is seen. We have treated her in the past for these areas that arise with doxy. She was given a prescription for this to have on hand last time she was here. Get that filled. If she already took it, Dr Marlou Starks wants refill of doxy sent to her pharmacy to take until she gets insurance to be seen. She needs to follow the steps in below msg to be seen.

## 2013-04-21 NOTE — Telephone Encounter (Signed)
Pt returned call.  States she does not have a Rx and has no money for medicine if a new one is called in now.  She is to see Dr. Dana Allan tomorrow to assess if there is fluid that needs to be removed.  They will call from [place] if she needs to be seen urgently.

## 2013-04-21 NOTE — Telephone Encounter (Signed)
Message copied by Carlene Coria on Wed Apr 21, 2013  9:28 AM ------      Message from: [facility] Signs      Created: Mon Apr 19, 2013 12:40 PM       04-19-13 mcaid not active for January..I spoke with  Christine/[facility] @[id number].She says pt must come to her apt there on 1-27 nothing avail any earlier/for re-eval to [facility] program and follow proper chanels before being deen at [place]...[id number]...will send msg to [facility] for Dr Marlou Starks to review.       Pt states she has NO MONEY to pay for a visit.Altha Harm says that pt will need to be seen there first and then if necessary she will be sent to breast center for mgm and they will refer here if needed.PLEASE advise if this is ok per protcol or does Dr Marlou Starks want to see pt regardless of no money for visit.Marland KitchenMarland KitchenThayer Headings ------

## 2013-04-22 ENCOUNTER — Encounter: Payer: Self-pay | Admitting: Radiation Oncology

## 2013-04-22 ENCOUNTER — Ambulatory Visit
Admission: RE | Admit: 2013-04-22 | Discharge: 2013-04-22 | Disposition: A | Discharge status: Home / Self Care | Payer: Medicaid Other | Source: Ambulatory Visit | Attending: Radiation Oncology | Admitting: Radiation Oncology

## 2013-04-22 VITALS — BP 115/95 | HR 92 | Temp 98.1°F | Ht 65.0 in | Wt 151.9 lb

## 2013-04-22 DIAGNOSIS — C50419 Malignant neoplasm of upper-outer quadrant of unspecified female breast: Secondary | ICD-10-CM

## 2013-04-22 NOTE — Progress Notes (Addendum)
Brooke Wood here for follow up after treatment to her right breast.  She denies pain.  She does have fatigue and thinks she returned to work too soon.  She is taking tamoxifen daily.  The skin on her right breast has hyperpigmentation.  She is concerned that her right breast looks swollen.  She also has a healing pimple on her left breast.  She uses Aquaphor cream.

## 2013-04-22 NOTE — Progress Notes (Signed)
  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 The patient has noticed swelling in the right breast is concerned about this issue. She denies any nipple discharge or bleeding. She continues on tamoxifen and seems to be tolerating this well except for occasional hot flashes and night sweats. Patient has become more active at work and does lift some boxes. She denies any problems with swelling in her right arm or hand.                              ALLERGIES:  is allergic to aspirin; codeine; and codeine phosphate.  Meds: Current Outpatient Prescriptions  Medication Sig Dispense Refill  . potassium chloride SA (K-DUR,KLOR-CON) 20 MEQ tablet Take 1 tablet (20 mEq total) by mouth 3 (three) times daily.  90 tablet  3  . tamoxifen (NOLVADEX) 20 MG tablet Take 20 mg by mouth daily.      . valsartan-hydrochlorothiazide (DIOVAN-HCT) 160-25 MG per tablet Take 1 tab QD  90 tablet  3  . traMADol (ULTRAM) 50 MG tablet Take 1 tablet (50 mg total) by mouth every 8 (eight) hours as needed for pain.  60 tablet  5   No current facility-administered medications for this encounter.    Physical Findings: The patient is in no acute distress. Patient is alert and oriented.  height is 5\' 5"  (1.651 m) and weight is 151 lb 14.4 oz (68.901 kg). Her temperature is 98.1 F (36.7 C). Her blood pressure is 115/95 and her pulse is 92. Marland Kitchen  No palpable supraclavicular or or axillary adenopathy. The lungs are clear to auscultation. The heart has regular rhythm and rate. Examination of the left breast reveals no mass or nipple discharge. Examination right breast  reveals some diffuse swelling as well as hyperpigmentation changes. There is no dominant mass appreciated breast nipple discharge or bleeding.  Lab Findings: Lab Results  Component Value Date   WBC 6.9 10/23/2012   HGB 13.7 10/23/2012   HCT 40.3 10/23/2012   MCV 89.1 10/23/2012   PLT 235 10/23/2012      Radiographic Findings: No results found.  Impression:  No evidence of recurrence on clinical exam today. Patient does have swelling within the breast and would likely benefit from physical therapy evaluation and management for breast lymphedema.  Plan:  Patient will be scheduling her mammogram next week through the [facility] program. She also be seeing medical oncology in the near future.  ____________________________________ Blair Promise, MD

## 2013-04-23 ENCOUNTER — Telehealth: Payer: Self-pay | Admitting: *Deleted

## 2013-04-23 NOTE — Telephone Encounter (Signed)
Called patient to inform of appt. For PT on 04/28/13 at 3:15 pm at [facility], spoke with patient and she is aware of this appt.

## 2013-04-26 ENCOUNTER — Other Ambulatory Visit (HOSPITAL_COMMUNITY): Payer: Self-pay | Admitting: *Deleted

## 2013-04-26 ENCOUNTER — Telehealth: Payer: Self-pay | Admitting: *Deleted

## 2013-04-26 ENCOUNTER — Ambulatory Visit (HOSPITAL_BASED_OUTPATIENT_CLINIC_OR_DEPARTMENT_OTHER): Payer: Self-pay | Admitting: Oncology

## 2013-04-26 ENCOUNTER — Encounter: Payer: Self-pay | Admitting: Oncology

## 2013-04-26 ENCOUNTER — Other Ambulatory Visit (HOSPITAL_BASED_OUTPATIENT_CLINIC_OR_DEPARTMENT_OTHER): Payer: Medicaid Other

## 2013-04-26 VITALS — BP 126/84 | HR 103 | Temp 99.1°F | Resp 18 | Ht 65.0 in | Wt 152.9 lb

## 2013-04-26 DIAGNOSIS — C50419 Malignant neoplasm of upper-outer quadrant of unspecified female breast: Secondary | ICD-10-CM

## 2013-04-26 DIAGNOSIS — Z853 Personal history of malignant neoplasm of breast: Secondary | ICD-10-CM

## 2013-04-26 DIAGNOSIS — C50411 Malignant neoplasm of upper-outer quadrant of right female breast: Secondary | ICD-10-CM

## 2013-04-26 LAB — CBC WITH DIFFERENTIAL/PLATELET
BASO%: 1.3 % (ref 0.0–2.0)
BASOS ABS: 0.1 10*3/uL (ref 0.0–0.1)
EOS ABS: 0 10*3/uL (ref 0.0–0.5)
EOS%: 0.6 % (ref 0.0–7.0)
HCT: 38 % (ref 34.8–46.6)
HEMOGLOBIN: 12.8 g/dL (ref 11.6–15.9)
LYMPH#: 1.7 10*3/uL (ref 0.9–3.3)
LYMPH%: 27.1 % (ref 14.0–49.7)
MCH: 30.4 pg (ref 25.1–34.0)
MCHC: 33.7 g/dL (ref 31.5–36.0)
MCV: 90.1 fL (ref 79.5–101.0)
MONO#: 0.7 10*3/uL (ref 0.1–0.9)
MONO%: 11.3 % (ref 0.0–14.0)
NEUT%: 59.7 % (ref 38.4–76.8)
NEUTROS ABS: 3.7 10*3/uL (ref 1.5–6.5)
Platelets: 214 10*3/uL (ref 145–400)
RBC: 4.22 10*6/uL (ref 3.70–5.45)
RDW: 13.2 % (ref 11.2–14.5)
WBC: 6.2 10*3/uL (ref 3.9–10.3)

## 2013-04-26 LAB — COMPREHENSIVE METABOLIC PANEL (CC13)
ALK PHOS: 81 U/L (ref 40–150)
ALT: 10 U/L (ref 0–55)
AST: 8 U/L (ref 5–34)
Albumin: 3.7 g/dL (ref 3.5–5.0)
Anion Gap: 9 mEq/L (ref 3–11)
BUN: 13.5 mg/dL (ref 7.0–26.0)
CO2: 22 mEq/L (ref 22–29)
Calcium: 9.1 mg/dL (ref 8.4–10.4)
Chloride: 110 mEq/L — ABNORMAL HIGH (ref 98–109)
Creatinine: 0.7 mg/dL (ref 0.6–1.1)
Glucose: 113 mg/dl (ref 70–140)
Potassium: 4.1 mEq/L (ref 3.5–5.1)
SODIUM: 140 meq/L (ref 136–145)
TOTAL PROTEIN: 6.7 g/dL (ref 6.4–8.3)
Total Bilirubin: 0.28 mg/dL (ref 0.20–1.20)

## 2013-04-26 NOTE — Progress Notes (Signed)
OFFICE PROGRESS NOTE  CC  [name], MD [address] [facility] [place], 1st [address] [place] [address] Dr. Autumn Messing Dr. 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  PRIOR THERAPY:  #1 patient was originally seen in the multidisciplinary breast clinic on 03/11/2012 with new diagnosis of a palpable mass in the right breast measuring 2.9 cm on mammogram. Her MRI revealed the mass to be 2.7 cm. Core needle biopsy showed invasive lobular carcinoma ER positive PR positive HER-2/neu negative with a low Ki-67.  #2 she has now gone on to have a lumpectomy with sentinel lymph node biopsy. Her final pathology reveals a 2.8 cm invasive tubular lobular carcinoma grade 2 one sentinel node has isolated tumor cells. Tumor is ER +99% PR +4% HER-2/neu negative Ki-67 60%. Pathologic stage T2N(i).  #3 patient had an Oncotype DX performed her score was 10 giving her a 7% risk of distance recurrence with tamoxifen for 5 years. I have discussed this in detail with her  #4 patient is currently receiving radiation therapy she is tolerating it well. She will be finishing this up on April 30.  #5 once patient completes radiation I will give her tamoxifen 20 mg daily adjuvantly for duration of 10 years.  CURRENT THERAPY:  tamoxifen 20 mg daily starting may 2014  INTERVAL HISTORY: Brooke Wood 48 y.o. female returns for followup visit. Clinically patient seems to be doing well without any problems. She has no nausea vomiting fevers chills night sweats. Her right breast wound still is a little tender. Remainder of the 10 point review of systems is negative  MEDICAL HISTORY: Past Medical History  Diagnosis Date  . Allergy     Neg RAST 2007  . Hypertension   . Vocal cord dysfunction     Neg Methacholine challenge test 2007  . Breast cancer     right breast  . Hx of  radiation therapy 06/15/12 - 07/29/12    right rbeast, 50.4 Gy x 28 fx, boost to cumulative dose 60.4 gray    ALLERGIES:  is allergic to aspirin; codeine; and codeine phosphate.  MEDICATIONS:  Current Outpatient Prescriptions  Medication Sig Dispense Refill  . potassium chloride SA (K-DUR,KLOR-CON) 20 MEQ tablet Take 1 tablet (20 mEq total) by mouth 3 (three) times daily.  90 tablet  3  . tamoxifen (NOLVADEX) 20 MG tablet Take 20 mg by mouth daily.      . valsartan-hydrochlorothiazide (DIOVAN-HCT) 160-25 MG per tablet Take 1 tab QD  90 tablet  3   No current facility-administered medications for this visit.    SURGICAL HISTORY:  Past Surgical History  Procedure Laterality Date  . Breast surgery  1985    rt br bx  . Breast lumpectomy with sentinel lymph node biopsy  04/09/2012    Procedure: BREAST LUMPECTOMY WITH SENTINEL LYMPH NODE BX;  Surgeon: Brooke Roof, MD;  Location: [facility];  Service: General;  Laterality: Right;    REVIEW OF SYSTEMS:  Pertinent items are noted in HPI.   HEALTH MAINTENANCE:   PHYSICAL EXAMINATION: Blood pressure 126/84, pulse 103, temperature 99.1 F (37.3 C), temperature source Oral, resp. rate 18, height 5' 5"  (1.651 m), weight 152 lb 14.4 oz (69.355 kg). Body mass index is 25.44 kg/(m^2). ECOG PERFORMANCE STATUS: 0 - Asymptomatic   General appearance: alert, cooperative and appears stated age  Lymph nodes: Cervical, supraclavicular, and axillary nodes normal. Resp: clear to auscultation bilaterally Back: symmetric, no curvature. ROM normal. No CVA tenderness. Cardio: regular rate and rhythm GI: soft, non-tender; bowel sounds normal; no masses,  no organomegaly Extremities: extremities normal, atraumatic, no cyanosis or edema Neurologic: Grossly normal Right breast: no masses or nipple discharge, darkening of the skin, some swelling  LABORATORY DATA: Lab Results  Component Value Date   WBC 6.2 04/26/2013   HGB 12.8 04/26/2013    HCT 38.0 04/26/2013   MCV 90.1 04/26/2013   PLT 214 04/26/2013      Chemistry      Component Value Date/Time   NA 140 04/26/2013 0958   NA 134* 11/24/2012 1040   K 4.1 04/26/2013 0958   K 3.3* 11/24/2012 1040   CL 99 11/24/2012 1040   CL 98 07/24/2012 1248   CO2 22 04/26/2013 0958   CO2 29 11/24/2012 1040   BUN 13.5 04/26/2013 0958   BUN 16 11/24/2012 1040   CREATININE 0.7 04/26/2013 0958   CREATININE 0.6 11/24/2012 1040      Component Value Date/Time   CALCIUM 9.1 04/26/2013 0958   CALCIUM 9.6 11/24/2012 1040   ALKPHOS 81 04/26/2013 0958   AST 8 04/26/2013 0958   ALT 10 04/26/2013 0958   BILITOT 0.28 04/26/2013 0958     ADDITIONAL INFORMATION: 1. CHROMOGENIC IN-SITU HYBRIDIZATION Interpretation HER-2/NEU BY CISH - NO AMPLIFICATION OF HER-2 DETECTED. THE RATIO OF HER-2: CEP 17 SIGNALS WAS 1.10. H. Lynnell Chad MD Pathologist, Electronic Signature ( Signed 04/16/2012) FINAL DIAGNOSIS Diagnosis 1. Breast, lumpectomy, right - INVASIVE TUBULO-LOBULAR CARCINOMA, GRADE II/III, SPANNING 2.8 CM. 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 - THE SURGICAL RESECTION MARGINS ARE NEGATIVE FOR INVASIVE CARCINOMA. - SEE ONCOLOGY TABLE BELOW. 2. Lymph node, sentinel, biopsy, Right #1 - ISOLATED TUMOR CELL(S) IN 1 OF 1 LYMPH NODE. - SEE COMMENT. 3. Breast, excision, Right-new inferior margin - BENIGN BREAST PARENCHYMA. - THERE IS NO EVIDENCE OF MALIGNANCY. - SEE COMMENT. Microscopic Comment 1. BREAST, INVASIVE TUMOR, WITH LYMPH NODE SAMPLING Specimen, including laterality: Right breast 1 of 3 FINAL for Wood, Brooke I ([id number]) Microscopic Comment(continued) Procedure: Lumpectomy Grade: II Tubule formation: 3 Nuclear pleomorphism: 2 Mitotic: 1 Tumor size (gross measurement): 2.8 cm Margins: Negative for carcinoma Invasive, distance to closest margin: 0.5 cm to the inferior margin of specimen #1 (gross measurement) Lymphovascular invasion: Not identified Ductal  carcinoma in situ: Not identified Tumor focality: Unifocal Treatment effect: N/A Extent of tumor: Confined to breast parenchyma. Lymph nodes: # examined: 1 Lymph nodes with metastasis: 0 Isolated tumor cells (< 0.2 mm): 1 Micrometastasis: (> 0.2 mm and < 2.0 mm): 0 Macrometastasis: (> 2.0 mm): 0 Extracapsular extension: Not identified. Breast prognostic profile: Case [id number] Estrogen receptor: 99%, strong staining intensity Progesterone receptor: 84%, strong staining intensity Her 2 neu: No amplification was detected. The ratio was 1.30. Her 2 neu by CISH will be repeated on the current case and the results reported separately. Ki-67: 16% Non-neoplastic breast: Healing biopsy site TNM: pT2, pN0 (i+) Comments: Immunohistochemical stains performed on the lymph node reveal cytokeratin positive isolated tumor cell(s). Most of the invasive cells are negative for E-Cadherin, supporting a lobular phenotype. However, within the 2.8 cm tumor are distinct areas of phenotypically dissimilar invasive carcinoma cells consisting of well formed glands. These components are strongly positive for E-Cadherin, supporting a component of ductal carcinoma. Overall, I believe this invasive tumor is best phenotyped as an invasive tubulo-lobular carcinoma. 3.  The surgical resection margin(s) of the specimen were inked and microscopically evaluated.  RADIOGRAPHIC STUDIES:  Nm Sentinel Node Inj-no Rpt (breast)  04/09/2012  CLINICAL DATA: right breast cancer   Sulfur colloid was injected intradermally by the nuclear medicine  technologist for breast cancer sentinel node localization.      ASSESSMENT: 48 year old female with  #1 stage II (T2 N(i)) invasive tubular lobular carcinoma patient is status post lumpectomy in January 2014. She is doing well postop. We will send off an Oncotype DX testing on her. She and I discussed the rationale for this today. She will also be referred to radiation  oncology  #2 patient had an Oncotype DX testing her breast cancer recurrence score is 10 giving her about 7% risk of distance recurrence with tamoxifen for 5 years. I have discussed this with the patient. Plan will be eventually to put her on tamoxifen since she is premenopausal. There is new data regarding use of tamoxifen for 10 years we also discussed this.  #3patient will finish her radiation on April 30. Upon completion of this she will begin tamoxifen 20 mg daily. Prescription in for this was given to her. Risks and benefits and side effects of tamoxifen were discussed with her. Duration of therapy was also discussed with her which will be for 10 years.  PLAN:  #1 patient is tolerating tamoxifen 20 mg daily since she began this in May 2014.  #2she will now be seen back in 6 months time for followup.  All questions were answered. The patient knows to call the clinic with any problems, questions or concerns. We can certainly see the patient much sooner if necessary.  I spent 15 minutes counseling the patient face to face. The total time spent in the appointment was 30 minutes.    Marcy Panning, MD Medical/Oncology [facility] [phone] (beeper) [phone] (Office)

## 2013-04-26 NOTE — Patient Instructions (Signed)
Breast Cancer Survivor Follow-Up Breast cancer begins when cells in the breast divide too rapidly. The extra cells form a lump (tumor). When the cancer is treated, the goal is to get rid of all cancer cells. However, sometimes a few cells survive. These cancer cells can then grow. They become recurrent cancer. This means the cancer comes back after treatment.  Most cases of recurrent breast cancer develop 3 to 5 years after treatment. However, sometimes it comes back just a few months after treatment. Other times, it does not come back until years later. If the cancer comes back in the same area as the first breast cancer, it is called a local recurrence. If the cancer comes back somewhere else in the body, it is called regional recurrence if the site is fairly near the breast or distant recurrence if it is far from the breast. Your caregiver may also use the term metastasize to indicate a cancer that has gone to another part of your body. Treatment is still possible after either kind of recurrence. The cancer can still be controlled.  CAUSES OF RECURRENT CANCER No one knows exactly why breast cancer starts in the first place. Why the cancer comes back after treatment is also not clear. It is known that certain conditions, called risk factors, can make this more likely. They include:  Developing breast cancer for the first time before age 60.  Having breast cancer that involves the lymph nodes. These are small, round pieces of tissue found all over the body. Their job is to help fight infections.  Having a large tumor. Cancer is more apt to come back if the first tumor was bigger than 2 inches (5 cm).  Having certain types of breast cancer, such as:  Inflammatory breast cancer. This rare type grows rapidly and causes the breast to become red and swollen.  A high-grade tumor. The grade of a tumor indicates how fast it will grow and spread. High-grade tumors grow more quickly than other types.  HER2  cancer. This refers to the tumor's genetic makeup. Tumors that have this type of gene are more likely to come back after treatment.  Having close tumor margins. This refers to the space between the tumor and normal, noncancerous cells. If the space is small, the tumor has a greater chance of coming back.  Having treatment involving a surgery to remove the tumor but not the entire breast (lumpectomy) and no radiation therapy. CARE AFTER BREAST CANCER Home Monitoring Women who have had breast cancer should continue to examine their breasts every month. The goal is to catch the cancer quickly if it comes back. Many women find it helpful to do so on the same day each month and to mark the calendar as a reminder. Let your caregiver know immediately if you have any signs of recurrent breast cancer. Symptoms will vary, depending on where the cancer recurs. The original type of treatment can also make a difference. Symptoms of local recurrence after a lumpectomy or a recurrence in the opposite breast may include:  A new lump or thickening in the breast.  A change in the way the skin looks on the breast (such as a rash, dimpling, or wrinkling).  Redness or swelling of the breast.  Changes in the nipple (such as being red, puckered, swollen, or leaking fluid). Symptoms of a recurrence after a breast removal surgery (mastectomy) may include:  A lump or thickening under the skin.  A thickening around the mastectomy scar. Symptoms   of regional recurrence in the lymph nodes near the breast may include:  A lump under the arm or above the collarbone.  Swelling of the arm.  Pain in the arm, shoulder, or chest.  Numbness in the hand or arm. Symptoms of distant recurrence may include:  A cough that does not go away.  Trouble breathing or shortness of breath.  Pain in the bones or the chest. This is pain that lasts or does not respond to rest and medicine.  Headaches.  Sudden vision  problems.  Dizziness.  Nausea or vomiting.  Losing weight without trying to.  Persistent abdominal pain.  Changes in bowel movements or blood in the stool.  Yellowing of the skin or eyes (jaundice).  Blood in the urine or bloody vaginal discharge. Clinical Monitoring  It is helpful to keep a schedule of appointments for needed tests and exams. This includes physical exams, breast exams, exams of the lymph nodes, and general exams.  For the first 3 years after being treated for breast cancer, see your caregiver every 3 to 6 months.  For years 4 and 5 after breast cancer, see your caregiver every 6 to 12 months.  After 5 years, see your caregiver at least once a year.  Regular breast X-rays (mammograms) should continue even if you had a mastectomy.  A mammogram should be done 1 year after the mammogram that first detected breast cancer.  A mammogram should be done every 6 to 12 months after that. Follow your caregiver's advice.  A pelvic exam done by your caregiver checks whether female organs are the normal size and shape. The exam is usually done every year. Ask your caregiver if that schedule is right for you.  Women taking tamoxifen should report any vaginal bleeding immediately to their caregiver. Tamoxifen is often given to women with a certain type of breast cancer. It has been shown to help prevent recurrence.  You will need to decide who your primary caregiver will be.  Most people continue to see their cancer specialist (oncologist) every 3 to 6 months for the first year after cancer treatment.  At some point, you may want to go back to seeing your family caregiver. You would no longer see your oncologist for regular checkups. Many women do this about 1 year after their first diagnosis of breast cancer.  You will still need to be seen every so often by your oncologist. Ask how often that should be. Coordinate this with your family or primary caregiver.  Think about  having genetic counseling. This would provide information on traits that can be passed or inherited from one generation to the next. In some cases, breast cancer runs in families. Tell your caregiver if you:  Are of Ashkenazi Jewish heritage.  Have any family member who has had ovarian cancer.  Have a mother, sister, or daughter who had breast cancer before age 7.  Have 2 or more close relatives who have had breast cancer. This means a mother, sister, daughter, aunt, or grandmother.  Had breast cancer in both breasts.  Have a female relative who has had breast cancer.  Some tests are not recommended for routine screening. Someone recovering from breast cancer does not need to have these tests if there are no problems. The tests have risks, such as radiation exposure, and can be costly. The risks of these tests are thought to be greater than the benefits:  Blood tests.  Chest X-rays.  Bone scans.  Liver ultrasound.  Computed tomography (CT scan).  Positron emission tomography (PET scan).  Magnetic resonance imaging (MRI scan). DIAGNOSIS OF RECURRENT CANCER Recurrent breast cancer may be suspected for various reasons. A mammogram may not look normal. You might feel a lump or have other symptoms. Your caregiver may find something unusual during an exam. To be sure, your caregiver will probably order some tests. The tests are needed because there are symptoms or hints of a problem. They could include:  Blood tests, including a test to check how well the liver is working. The liver is a common site for a distant cancer recurrence.  Imaging tests that create pictures of the inside of the body. These tests include:  Chest X-rays to show if the cancer has come back in the lungs.  CT scans to create detailed pictures of various areas of the body and help find a distant recurrence.  MRI scans to find anything unusual in the breast, chest, or lymph nodes.  Breast ultrasound tests to  examine the breasts.  Bone scans to create a picture of your whole skeleton and find cancer in bony areas.  PET scans to create an image of the whole body. PET scans can be used together with CT scans to show more detail.  Biopsy. A small sample of tissue is taken and checked under a microscope. If cancer cells are found, they may be tested to see if they contain the HER2 gene or the hormones estrogen and progesterone. This will help your caregiver decide how to treat the recurrent cancer. TREATMENT  How recurrent breast cancer is treated depends on where the new cancer is found. The type of treatment that was used for the first breast cancer makes a difference, too. A combination of treatments may be used. Options include:  Surgery.  If the cancer comes back in the breast that was not treated before, you may need a lumpectomy or mastectomy.  If the cancer comes back in the breast that was treated before, you may need a mastectomy.  The lymph nodes under the arm may need to be removed.  Radiation therapy.  For a local recurrence, radiation may be used if it was not used during the first treatment.  For a distance recurrence, radiation is sometimes used.  Chemotherapy.  This may be used before surgery to treat recurrent breast cancer.  This may be used to treat recurrent cancer that cannot be treated with surgery.  This may be used to treat a distant recurrence.  Hormone therapy.  Women with the HER2 gene may be given hormone therapy to attack this gene. Document Released: 11/14/2010 Document Revised: 06/10/2011 Document Reviewed: 11/14/2010 Exit[employer] Patient Information 2014 [employer].  

## 2013-04-26 NOTE — Telephone Encounter (Signed)
appts made and printed...td 

## 2013-04-27 ENCOUNTER — Encounter (HOSPITAL_COMMUNITY): Payer: Self-pay

## 2013-04-27 ENCOUNTER — Ambulatory Visit (HOSPITAL_COMMUNITY)
Admission: RE | Admit: 2013-04-27 | Discharge: 2013-04-27 | Disposition: A | Discharge status: Home / Self Care | Payer: Self-pay | Source: Ambulatory Visit | Attending: Obstetrics and Gynecology | Admitting: Obstetrics and Gynecology

## 2013-04-27 VITALS — BP 102/76 | Temp 97.9°F | Ht 65.0 in | Wt 155.6 lb

## 2013-04-27 DIAGNOSIS — Z1239 Encounter for other screening for malignant neoplasm of breast: Secondary | ICD-10-CM

## 2013-04-27 NOTE — Patient Instructions (Signed)
[facility] how to perform BSE. Patient did not need a Pap smear today due to last Pap smear was 02/06/2012. Let her know BCCCP will cover Pap smears every 3 years unless has a history of abnormal Pap smears.  Referred patient to the [facility] for diagnostic mammogram and possible right breast ultrasound. Appointment scheduled for Wednesday, April 28, 2013 at 0930. Patient aware of appointment and will be there. [name] verbalized understanding.  Maelani Yarbro, Arvil Chaco, RN 3:41 PM

## 2013-04-27 NOTE — Progress Notes (Signed)
Complaints of right breast swollen x 2 weeks.  Pap Smear:  Pap smear not completed today. Patients last Pap smear was 02/06/2012 and normal. Per patient she has a history of one abnormal Pap smear around 27 years ago that required cryotherapy for follow up. No Pap smear results in EPIC.  Physical exam: Breasts Right breast larger than left breast. Right breast is swollen with redness lower breast. Right lower breast warm to touch. No skin abnormalities left breast. No nipple retraction bilateral breasts. No nipple discharge bilateral breasts. No lymphadenopathy. No lumps palpated bilateral breasts. Complaints of pain when palpated right breast. Referred patient to the [facility] for diagnostic mammogram and possible right breast ultrasound. Appointment scheduled for Wednesday, April 28, 2013 at 0930.    Pelvic/Bimanual No Pap smear completed today since last Pap smear was 02/06/2012. Pap smear not indicated per BCCCP guidelines.

## 2013-04-28 ENCOUNTER — Ambulatory Visit: Payer: No Typology Code available for payment source | Attending: Radiation Oncology | Admitting: Physical Therapy

## 2013-04-28 ENCOUNTER — Ambulatory Visit
Admission: RE | Admit: 2013-04-28 | Discharge: 2013-04-28 | Disposition: A | Discharge status: Home / Self Care | Payer: No Typology Code available for payment source | Source: Ambulatory Visit | Attending: Obstetrics and Gynecology | Admitting: Obstetrics and Gynecology

## 2013-04-28 DIAGNOSIS — R0989 Other specified symptoms and signs involving the circulatory and respiratory systems: Secondary | ICD-10-CM | POA: Insufficient documentation

## 2013-04-28 DIAGNOSIS — M549 Dorsalgia, unspecified: Secondary | ICD-10-CM | POA: Insufficient documentation

## 2013-04-28 DIAGNOSIS — Z853 Personal history of malignant neoplasm of breast: Secondary | ICD-10-CM

## 2013-04-28 DIAGNOSIS — I498 Other specified cardiac arrhythmias: Secondary | ICD-10-CM | POA: Insufficient documentation

## 2013-04-28 DIAGNOSIS — I890 Lymphedema, not elsewhere classified: Secondary | ICD-10-CM | POA: Insufficient documentation

## 2013-04-28 DIAGNOSIS — IMO0001 Reserved for inherently not codable concepts without codable children: Secondary | ICD-10-CM | POA: Insufficient documentation

## 2013-04-28 DIAGNOSIS — R0609 Other forms of dyspnea: Secondary | ICD-10-CM | POA: Insufficient documentation

## 2013-04-28 DIAGNOSIS — J383 Other diseases of vocal cords: Secondary | ICD-10-CM | POA: Insufficient documentation

## 2013-04-28 DIAGNOSIS — E049 Nontoxic goiter, unspecified: Secondary | ICD-10-CM | POA: Insufficient documentation

## 2013-05-06 ENCOUNTER — Ambulatory Visit: Payer: Self-pay | Attending: Radiation Oncology | Admitting: Physical Therapy

## 2013-05-06 DIAGNOSIS — Z853 Personal history of malignant neoplasm of breast: Secondary | ICD-10-CM | POA: Insufficient documentation

## 2013-05-06 DIAGNOSIS — I890 Lymphedema, not elsewhere classified: Secondary | ICD-10-CM | POA: Insufficient documentation

## 2013-05-06 DIAGNOSIS — R0989 Other specified symptoms and signs involving the circulatory and respiratory systems: Secondary | ICD-10-CM | POA: Insufficient documentation

## 2013-05-06 DIAGNOSIS — E049 Nontoxic goiter, unspecified: Secondary | ICD-10-CM | POA: Insufficient documentation

## 2013-05-06 DIAGNOSIS — IMO0001 Reserved for inherently not codable concepts without codable children: Secondary | ICD-10-CM | POA: Insufficient documentation

## 2013-05-06 DIAGNOSIS — I498 Other specified cardiac arrhythmias: Secondary | ICD-10-CM | POA: Insufficient documentation

## 2013-05-06 DIAGNOSIS — J383 Other diseases of vocal cords: Secondary | ICD-10-CM | POA: Insufficient documentation

## 2013-05-06 DIAGNOSIS — M549 Dorsalgia, unspecified: Secondary | ICD-10-CM | POA: Insufficient documentation

## 2013-05-06 DIAGNOSIS — R0609 Other forms of dyspnea: Secondary | ICD-10-CM | POA: Insufficient documentation

## 2013-05-12 ENCOUNTER — Ambulatory Visit: Payer: Self-pay

## 2013-05-21 ENCOUNTER — Ambulatory Visit: Payer: Self-pay | Admitting: Physical Therapy

## 2013-06-03 ENCOUNTER — Ambulatory Visit (INDEPENDENT_AMBULATORY_CARE_PROVIDER_SITE_OTHER): Payer: Medicaid Other | Admitting: Internal Medicine

## 2013-06-03 ENCOUNTER — Encounter: Payer: Self-pay | Admitting: Internal Medicine

## 2013-06-03 VITALS — BP 130/88 | HR 80 | Temp 97.8°F | Resp 16 | Ht 65.0 in | Wt 152.0 lb

## 2013-06-03 DIAGNOSIS — Z0000 Encounter for general adult medical examination without abnormal findings: Secondary | ICD-10-CM | POA: Insufficient documentation

## 2013-06-03 DIAGNOSIS — I10 Essential (primary) hypertension: Secondary | ICD-10-CM

## 2013-06-03 NOTE — Progress Notes (Signed)
Pre visit review using our clinic review tool, if applicable. No additional management support is needed unless otherwise documented below in the visit note. 

## 2013-06-03 NOTE — Assessment & Plan Note (Signed)
Her BP is well controlled 

## 2013-06-03 NOTE — Patient Instructions (Signed)

## 2013-06-03 NOTE — Progress Notes (Signed)
   Subjective:    Patient ID: Brooke Wood, female    DOB: Feb 29, 1966, 48 y.o.   MRN: [id number]  Hypertension This is a chronic problem. The current episode started more than 1 year ago. The problem has been gradually improving since onset. The problem is controlled. Pertinent negatives include no anxiety, blurred vision, chest pain, headaches, malaise/fatigue, neck pain, orthopnea, palpitations, peripheral edema, PND, shortness of breath or sweats. Past treatments include angiotensin blockers and diuretics. There are no compliance problems.  There is no history of angina, kidney disease, CAD/MI, CVA, heart failure, left ventricular hypertrophy or retinopathy.      Review of Systems  Constitutional: Negative.  Negative for fever, chills, malaise/fatigue, diaphoresis, appetite change and fatigue.  HENT: Negative.   Eyes: Negative.  Negative for blurred vision.  Respiratory: Negative.  Negative for cough, choking, chest tightness, shortness of breath, wheezing and stridor.   Cardiovascular: Negative.  Negative for chest pain, palpitations, orthopnea, leg swelling and PND.  Gastrointestinal: Negative.  Negative for nausea, vomiting, abdominal pain, diarrhea, constipation and blood in stool.  Endocrine: Negative.   Genitourinary: Negative.  Negative for dysuria, urgency, frequency, hematuria, flank pain, difficulty urinating and genital sores.  Musculoskeletal: Negative.  Negative for neck pain.  Skin: Negative.   Allergic/Immunologic: Negative.   Neurological: Negative.  Negative for headaches.  Hematological: Negative.  Negative for adenopathy. Does not bruise/bleed easily.  Psychiatric/Behavioral: Negative.        Objective:   Physical Exam  Vitals reviewed. Constitutional: She is oriented to person, place, and time. She appears well-developed and well-nourished. No distress.  HENT:  Head: Normocephalic and atraumatic.  Mouth/Throat: Oropharynx is clear and moist. No oropharyngeal  exudate.  Eyes: Conjunctivae are normal. Right eye exhibits no discharge. Left eye exhibits no discharge. No scleral icterus.  Neck: Normal range of motion. Neck supple. No JVD present. No tracheal deviation present. No thyromegaly present.  Cardiovascular: Normal rate, regular rhythm, normal heart sounds and intact distal pulses.  Exam reveals no gallop and no friction rub.   No murmur heard. Pulmonary/Chest: Effort normal and breath sounds normal. No stridor. No respiratory distress. She has no wheezes. She has no rales. She exhibits no tenderness.  Abdominal: Soft. Bowel sounds are normal. She exhibits no distension and no mass. There is no tenderness. There is no rebound and no guarding.  Musculoskeletal: Normal range of motion. She exhibits no edema and no tenderness.  Lymphadenopathy:    She has no cervical adenopathy.  Neurological: She is oriented to person, place, and time.  Skin: Skin is warm and dry. No rash noted. She is not diaphoretic. No erythema. No pallor.     Lab Results  Component Value Date   WBC 6.2 04/26/2013   HGB 12.8 04/26/2013   HCT 38.0 04/26/2013   PLT 214 04/26/2013   GLUCOSE 113 04/26/2013   CHOL 167 11/24/2012   TRIG 123.0 11/24/2012   HDL 52.70 11/24/2012   LDLCALC 90 11/24/2012   ALT 10 04/26/2013   AST 8 04/26/2013   NA 140 04/26/2013   K 4.1 04/26/2013   CL 99 11/24/2012   CREATININE 0.7 04/26/2013   BUN 13.5 04/26/2013   CO2 22 04/26/2013   TSH 1.23 11/24/2012   HGBA1C 6.1 11/24/2012       Assessment & Plan:

## 2013-07-13 ENCOUNTER — Telehealth: Payer: Self-pay | Admitting: Oncology

## 2013-07-13 NOTE — Telephone Encounter (Signed)
Brooke Wood called and said she has noticed that her right hand has been swelling after she has been at work.  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  She said it is not swollen now.  She is wondering if she needs to come in to be seen tomorrow.

## 2013-07-15 ENCOUNTER — Encounter: Payer: Self-pay | Admitting: Radiation Oncology

## 2013-07-15 ENCOUNTER — Telehealth: Payer: Self-pay | Admitting: Oncology

## 2013-07-15 ENCOUNTER — Ambulatory Visit
Admission: RE | Admit: 2013-07-15 | Discharge: 2013-07-15 | Disposition: A | Discharge status: Home / Self Care | Payer: Self-pay | Source: Ambulatory Visit | Attending: Radiation Oncology | Admitting: Radiation Oncology

## 2013-07-15 VITALS — BP 154/96 | HR 96 | Resp 16 | Wt 154.4 lb

## 2013-07-15 DIAGNOSIS — C50419 Malignant neoplasm of upper-outer quadrant of unspecified female breast: Secondary | ICD-10-CM

## 2013-07-15 NOTE — Progress Notes (Addendum)
Patient most concern about her right hand swelling when she is at work but, reports it resolves once she gets home. She denies nipple discharge or bleeding. She continues to take her tamoxifen as directed. Reports occasional night sweats and hot flashes. She continues to work. Patient reports she was advised by her physical therapy to purchase a compression bra but, hasn't because they are "too expensive." Blood pressure elevated. Advised patient to take blood pressure medication she has in her purse.

## 2013-07-15 NOTE — Addendum Note (Signed)
Encounter addended by: Jacqulyn Liner, RN on: 07/15/2013  9:26 AM<BR>     Documentation filed: Vitals Section

## 2013-07-15 NOTE — Progress Notes (Signed)
  Radiation Oncology         (336) [phone] ________________________________  Name: Brooke Wood MRN: [id number]  Date: 07/15/2013  DOB: 1965-10-09  Follow-Up Visit Note  CC: [name], MD  Deatra Robinson, MD  Diagnosis:   Stage IIa invasive lobular carcinoma the right breast  Interval Since Last Radiation:  11  months  Narrative:  The patient returns today for routine follow-up.  She requested to be seen earlier than her scheduled followup appointment. Patient has noticed problems with swelling in her right hand at work. She does have a lymphedema sleeve but no glove. She has not been wearing her lymphedema sleeve on a regular basis. She denies any pain in the breast area nipple discharge or bleeding. She continues to take tamoxifen.                              ALLERGIES:  is allergic to aspirin; codeine; and codeine phosphate.  Meds: Current Outpatient Prescriptions  Medication Sig Dispense Refill  . potassium chloride SA (K-DUR,KLOR-CON) 20 MEQ tablet Take 1 tablet (20 mEq total) by mouth 3 (three) times daily.  90 tablet  3  . tamoxifen (NOLVADEX) 20 MG tablet Take 20 mg by mouth daily.      . valsartan-hydrochlorothiazide (DIOVAN-HCT) 160-25 MG per tablet Take 1 tab QD  90 tablet  3   No current facility-administered medications for this encounter.    Physical Findings: The patient is in no acute distress. Patient is alert and oriented.  weight is 154 lb 6.4 oz (70.035 kg). Her blood pressure is 145/93 and her pulse is 100. Her respiration is 16. Marland Kitchen  No palpable supraclavicular or axillary adenopathy. The lungs are clear to auscultation. The heart has regular rhythm and rate. Examination of the left breast reveals no mass or nipple discharge. Examination right breast reveals hyperpigmentation changes. Patient continues to have some edema in the breast. No dominant masses appreciated breast discharge or bleeding.  Examination of the right arm and hand reveals mild lymphedema. No  signs of DVT on clinical exam today.  Lab Findings: Lab Results  Component Value Date   WBC 6.2 04/26/2013   HGB 12.8 04/26/2013   HCT 38.0 04/26/2013   MCV 90.1 04/26/2013   PLT 214 04/26/2013    Radiographic Findings: No results found.  Impression:  No evidence for recurrence on clinical exam today.  Plan:  Patient will be set up for physical therapy evaluation and fitting for a lymphedema glove for her right hand. Blood pressure will be repeated today. Patient was given a prescription for compression bra but I am unsure whether she will be able to obtain this without insurance. According to patient she has no insurance at this time.  Patient will also see social work today as she expresses significant stressors in her life.  ____________________________________ Blair Promise, MD

## 2013-07-15 NOTE — Telephone Encounter (Signed)
Left a message for Dr. Ronnald Ramp at [facility] regarding Brooke Wood's blood pressure today of 145/93 and 158/114.

## 2013-07-22 ENCOUNTER — Other Ambulatory Visit: Payer: Self-pay | Admitting: Oncology

## 2013-07-22 DIAGNOSIS — C50419 Malignant neoplasm of upper-outer quadrant of unspecified female breast: Secondary | ICD-10-CM

## 2013-07-28 ENCOUNTER — Telehealth: Payer: Self-pay | Admitting: Oncology

## 2013-07-28 NOTE — Telephone Encounter (Signed)
Called Brooke Wood back and advised her to use hydrocortisone cream to the itchy/red area on her right breast to see if it helps.  Advised her that she can come in to be seen by nursing tomorrow.  [name] said she would call in the morning to let us know if she is coming in.

## 2013-07-28 NOTE — Telephone Encounter (Signed)
Brooke Wood called and said that her right breast is red.  She said she had some itching in the center of her chest and that is when she noticed the redness.  She is wondering if she needs to be seen.

## 2013-07-29 ENCOUNTER — Ambulatory Visit
Admission: RE | Admit: 2013-07-29 | Discharge: 2013-07-29 | Disposition: A | Discharge status: Home / Self Care | Payer: Self-pay | Source: Ambulatory Visit | Attending: Radiation Oncology | Admitting: Radiation Oncology

## 2013-07-29 ENCOUNTER — Other Ambulatory Visit: Payer: Self-pay | Admitting: Internal Medicine

## 2013-07-29 VITALS — BP 139/98 | HR 101 | Temp 97.8°F | Ht 65.0 in | Wt 156.6 lb

## 2013-07-29 DIAGNOSIS — C50419 Malignant neoplasm of upper-outer quadrant of unspecified female breast: Secondary | ICD-10-CM

## 2013-07-29 NOTE — Progress Notes (Addendum)
Brooke Wood noticed a red area on her mid right chest yesterday.  She said it started itching and that is when she noticed it was red.  She also has a raised, red area on her nose and cheeks and some red areas on her upper arms.  Her right mid chest is red with a small raised, area.  Advised her to use hydrocortisone cream on the red areas.  Advised her to take Zyrtec that she already has tonight.  .Also to stop using her Gain detergent and to try a baby detergent like dreft.  She is using [employer].  She is also reporting that her legs are aching at night and that her heels hurt.  Advised her that it may be from standing at work for 4 hours.  Advised her that I would call her tomorrow to see how she is doing.

## 2013-08-02 ENCOUNTER — Ambulatory Visit (INDEPENDENT_AMBULATORY_CARE_PROVIDER_SITE_OTHER): Payer: Self-pay | Admitting: Internal Medicine

## 2013-08-02 ENCOUNTER — Encounter: Payer: Self-pay | Admitting: Internal Medicine

## 2013-08-02 VITALS — BP 130/82 | HR 82 | Temp 98.1°F | Resp 16 | Wt 150.0 lb

## 2013-08-02 DIAGNOSIS — I10 Essential (primary) hypertension: Secondary | ICD-10-CM

## 2013-08-02 DIAGNOSIS — J019 Acute sinusitis, unspecified: Secondary | ICD-10-CM

## 2013-08-02 DIAGNOSIS — E876 Hypokalemia: Secondary | ICD-10-CM

## 2013-08-02 MED ORDER — POTASSIUM CHLORIDE 20 MEQ PO PACK: 20.0000 meq | PACK | Freq: Every day | ORAL | Status: DC

## 2013-08-02 MED ORDER — AMOXICILLIN 875 MG PO TABS: 875.0000 mg | ORAL_TABLET | Freq: Two times a day (BID) | ORAL | Status: DC

## 2013-08-02 NOTE — Progress Notes (Signed)
Pre-visit discussion using our clinic review tool, as applicable. No additional management support is needed unless otherwise documented below in the visit note.  

## 2013-08-02 NOTE — Progress Notes (Signed)
Subjective:    Patient ID: Brooke Wood, female    DOB: 05-Aug-1965, 48 y.o.   MRN: [id number]  Sinusitis This is a new problem. The current episode started in the past 7 days. The problem has been gradually worsening since onset. The maximum temperature recorded prior to her arrival was 100 - 100.9 F. Her pain is at a severity of 1/10. The pain is mild. Associated symptoms include chills, congestion, coughing (NP) and sinus pressure. Pertinent negatives include no diaphoresis, ear pain, headaches, hoarse voice, neck pain, shortness of breath, sneezing, sore throat or swollen glands. Past treatments include oral decongestants. The treatment provided mild relief.      Review of Systems  Constitutional: Positive for chills. Negative for diaphoresis.  HENT: Positive for congestion, postnasal drip, rhinorrhea and sinus pressure. Negative for dental problem, ear pain, hoarse voice, mouth sores, nosebleeds, sneezing, sore throat, trouble swallowing and voice change.   Eyes: Negative.   Respiratory: Positive for cough (NP). Negative for shortness of breath.   Cardiovascular: Negative.  Negative for chest pain, palpitations and leg swelling.  Gastrointestinal: Negative.  Negative for nausea, vomiting, abdominal pain, diarrhea and blood in stool.  Endocrine: Negative.   Genitourinary: Negative.   Musculoskeletal: Negative.  Negative for neck pain.  Skin: Negative.   Allergic/Immunologic: Negative.   Neurological: Negative.  Negative for weakness and headaches.  Hematological: Negative.  Negative for adenopathy. Does not bruise/bleed easily.  Psychiatric/Behavioral: Negative.        Objective:   Physical Exam  Vitals reviewed. Constitutional: She is oriented to person, place, and time. She appears well-developed and well-nourished. No distress.  HENT:  Head: Normocephalic and atraumatic.  Right Ear: Hearing, tympanic membrane, external ear and ear canal normal.  Left Ear: Hearing, tympanic  membrane, external ear and ear canal normal.  Nose: Mucosal edema and rhinorrhea present. No sinus tenderness. No epistaxis.  No foreign bodies. Right sinus exhibits no maxillary sinus tenderness and no frontal sinus tenderness. Left sinus exhibits maxillary sinus tenderness. Left sinus exhibits no frontal sinus tenderness.  Mouth/Throat: Oropharynx is clear and moist and mucous membranes are normal. Mucous membranes are not pale, not dry and not cyanotic. No oral lesions. No trismus in the jaw. No uvula swelling. No oropharyngeal exudate, posterior oropharyngeal edema, posterior oropharyngeal erythema or tonsillar abscesses.  Eyes: Conjunctivae are normal. Right eye exhibits no discharge. Left eye exhibits no discharge. No scleral icterus.  Neck: Normal range of motion. Neck supple. No JVD present. No tracheal deviation present. No thyromegaly present.  Cardiovascular: Normal rate, regular rhythm, normal heart sounds and intact distal pulses.  Exam reveals no gallop and no friction rub.   No murmur heard. Pulmonary/Chest: Effort normal and breath sounds normal. No stridor. No respiratory distress. She has no wheezes. She has no rales. She exhibits no tenderness.  Abdominal: Soft. Bowel sounds are normal. She exhibits no distension and no mass. There is no tenderness. There is no rebound and no guarding.  Musculoskeletal: Normal range of motion. She exhibits no edema and no tenderness.  Lymphadenopathy:    She has no cervical adenopathy.  Neurological: She is oriented to person, place, and time.  Skin: Skin is warm and dry. No rash noted. She is not diaphoretic. No erythema. No pallor.  Psychiatric: She has a normal mood and affect. Her behavior is normal. Judgment and thought content normal.     Lab Results  Component Value Date   WBC 6.2 04/26/2013   HGB 12.8 04/26/2013  HCT 38.0 04/26/2013   PLT 214 04/26/2013   GLUCOSE 113 04/26/2013   CHOL 167 11/24/2012   TRIG 123.0 11/24/2012   HDL 52.70  11/24/2012   LDLCALC 90 11/24/2012   ALT 10 04/26/2013   AST 8 04/26/2013   NA 140 04/26/2013   K 4.1 04/26/2013   CL 99 11/24/2012   CREATININE 0.7 04/26/2013   BUN 13.5 04/26/2013   CO2 22 04/26/2013   TSH 1.23 11/24/2012   HGBA1C 6.1 11/24/2012       Assessment & Plan:

## 2013-08-02 NOTE — Patient Instructions (Signed)

## 2013-08-02 NOTE — Assessment & Plan Note (Signed)
Will treat the infection with amoxil 

## 2013-08-02 NOTE — Assessment & Plan Note (Signed)
I changed the K+ replacement to a liquid at her choice

## 2013-08-02 NOTE — Assessment & Plan Note (Signed)
Her BP is well controlled 

## 2013-08-03 ENCOUNTER — Telehealth: Payer: Self-pay | Admitting: Internal Medicine

## 2013-08-03 DIAGNOSIS — J0191 Acute recurrent sinusitis, unspecified: Secondary | ICD-10-CM

## 2013-08-03 NOTE — Telephone Encounter (Signed)
Relevant patient education mailed to patient.  

## 2013-08-03 NOTE — Telephone Encounter (Signed)
Patient states that her amoxicillin (AMOXIL) Rx made her vomit because the pills are too large for her to swallow. She wants to know if a "cheap" alternative can be sent to her pharmacy.

## 2013-08-04 MED ORDER — AMOXICILLIN 250 MG/5ML PO SUSR: 500.0000 mg | Freq: Three times a day (TID) | ORAL | Status: DC

## 2013-08-04 NOTE — Telephone Encounter (Signed)
Try the suspension

## 2013-08-04 NOTE — Telephone Encounter (Signed)
Left detailed message on pts VM. 

## 2013-08-17 ENCOUNTER — Other Ambulatory Visit: Payer: Self-pay | Admitting: Oncology

## 2013-08-24 ENCOUNTER — Other Ambulatory Visit: Payer: Self-pay | Admitting: Internal Medicine

## 2013-08-24 DIAGNOSIS — E876 Hypokalemia: Secondary | ICD-10-CM

## 2013-08-24 MED ORDER — POTASSIUM CHLORIDE CRYS ER 20 MEQ PO TBCR: EXTENDED_RELEASE_TABLET | ORAL | Status: DC

## 2013-08-31 ENCOUNTER — Encounter (INDEPENDENT_AMBULATORY_CARE_PROVIDER_SITE_OTHER): Payer: Self-pay | Admitting: General Surgery

## 2013-08-31 ENCOUNTER — Telehealth (INDEPENDENT_AMBULATORY_CARE_PROVIDER_SITE_OTHER): Payer: Self-pay | Admitting: *Deleted

## 2013-08-31 ENCOUNTER — Ambulatory Visit (INDEPENDENT_AMBULATORY_CARE_PROVIDER_SITE_OTHER): Payer: Self-pay | Admitting: General Surgery

## 2013-08-31 ENCOUNTER — Ambulatory Visit (HOSPITAL_COMMUNITY)
Admission: RE | Admit: 2013-08-31 | Discharge: 2013-08-31 | Disposition: A | Discharge status: Home / Self Care | Payer: Self-pay | Source: Ambulatory Visit | Attending: General Surgery | Admitting: General Surgery

## 2013-08-31 VITALS — BP 126/74 | HR 74 | Temp 97.5°F | Ht 65.0 in | Wt 153.0 lb

## 2013-08-31 DIAGNOSIS — M7989 Other specified soft tissue disorders: Secondary | ICD-10-CM

## 2013-08-31 DIAGNOSIS — C50419 Malignant neoplasm of upper-outer quadrant of unspecified female breast: Secondary | ICD-10-CM

## 2013-08-31 DIAGNOSIS — M25561 Pain in right knee: Secondary | ICD-10-CM | POA: Insufficient documentation

## 2013-08-31 DIAGNOSIS — M25569 Pain in unspecified knee: Secondary | ICD-10-CM

## 2013-08-31 DIAGNOSIS — M179 Osteoarthritis of knee, unspecified: Secondary | ICD-10-CM

## 2013-08-31 DIAGNOSIS — M1710 Unilateral primary osteoarthritis, unspecified knee: Secondary | ICD-10-CM | POA: Insufficient documentation

## 2013-08-31 NOTE — Telephone Encounter (Signed)
LM for pt to return my call.  Dr. Marlou Starks referred her to Dr. Ninfa Linden ([facility]).  Please advise her of the appt 09-06-13 @ 8:15.  Their address is [place].  If she needs to reschedule this appt, have her call [phone].  Thanks!  Anderson Malta

## 2013-08-31 NOTE — Telephone Encounter (Signed)
Pt returned my call and is aware of her appt below.  Pt agrees and is understanding.  Anderson Malta

## 2013-08-31 NOTE — Progress Notes (Signed)
Subjective:     Patient ID: Brooke Wood, female   DOB: 03-08-1966, 48 y.o.   MRN: [id number]  HPI The patient is a 48 year old black female who is one and a half years status post right lumpectomy and negative sentinel node biopsy for a T2 N0 right breast cancer. She finished radiation therapy and is now taking tamoxifen. She is tolerating this well. Her main complaint is of some swelling and pain in her legs and feet bilaterally but worse on the left. She also complains of some pain in her right knee.  Review of Systems  Constitutional: Negative.   HENT: Negative.   Eyes: Negative.   Respiratory: Negative.   Cardiovascular: Positive for leg swelling.  Gastrointestinal: Negative.   Endocrine: Negative.   Genitourinary: Negative.   Musculoskeletal: Positive for arthralgias.  Skin: Negative.   Allergic/Immunologic: Negative.   Neurological: Negative.   Hematological: Negative.   Psychiatric/Behavioral: Negative.        Objective:   Physical Exam  Constitutional: She is oriented to person, place, and time. She appears well-developed and well-nourished.  HENT:  Head: Normocephalic and atraumatic.  Eyes: Conjunctivae and EOM are normal. Pupils are equal, round, and reactive to light.  Neck: Normal range of motion. Neck supple.  Cardiovascular: Normal rate, regular rhythm and normal heart sounds.   Pulmonary/Chest: Effort normal and breath sounds normal.  Her right breast is firmer and thicker than the left breast secondary to radiation. There is no palpable mass in either breast. There is no palpable axillary, subclavicular, or cervical lymphadenopathy.  Abdominal: Soft. Bowel sounds are normal.  Musculoskeletal: Normal range of motion.  Lymphadenopathy:    She has no cervical adenopathy.  Neurological: She is alert and oriented to person, place, and time.  Skin: Skin is warm and dry.  Psychiatric: She has a normal mood and affect. Her behavior is normal.       Assessment:    The patient is1-1/2 years status post right lumpectomy for breast cancer     Plan:     At this point she will continue to do regular self exams. She will continue to take tamoxifen. I'll order a duplex study of her lower extremities to rule out DVT. I will also refer her to Dr. Zollie Beckers and orthopedics to evaluate her right knee

## 2013-08-31 NOTE — Patient Instructions (Signed)
Will get duplex of legs to look for clot. If neg then follow up with medical doc

## 2013-08-31 NOTE — Progress Notes (Signed)
Bilateral lower extremity venous duplex:  No evidence of DVT, superficial thrombosis, or Baker's cyst.   

## 2013-09-06 ENCOUNTER — Telehealth (INDEPENDENT_AMBULATORY_CARE_PROVIDER_SITE_OTHER): Payer: Self-pay | Admitting: *Deleted

## 2013-09-06 NOTE — Telephone Encounter (Signed)
Pt called regarding her referral to Dr. Ninfa Linden, Orthopedic [facility].  She states that their office said that she was going to have to pay $200 for the visit because she didn't have insurance.  I advised the pt that all we do is fax her records, along with the demographic and insurance information and then they schedule the appt.  I advised pt that we cannot discuss any financial obligation for their office with the pt.    Pt verbalized understanding.  Anderson Malta

## 2013-09-30 ENCOUNTER — Ambulatory Visit: Payer: Self-pay | Admitting: Radiation Oncology

## 2013-09-30 ENCOUNTER — Ambulatory Visit
Admission: RE | Admit: 2013-09-30 | Discharge: 2013-09-30 | Disposition: A | Discharge status: Home / Self Care | Payer: Self-pay | Source: Ambulatory Visit | Attending: Radiation Oncology | Admitting: Radiation Oncology

## 2013-09-30 ENCOUNTER — Encounter: Payer: Self-pay | Admitting: Radiation Oncology

## 2013-09-30 VITALS — BP 135/98 | HR 93 | Temp 98.3°F | Ht 65.0 in | Wt 151.7 lb

## 2013-09-30 DIAGNOSIS — C50411 Malignant neoplasm of upper-outer quadrant of right female breast: Secondary | ICD-10-CM

## 2013-09-30 NOTE — Progress Notes (Signed)
  Radiation Oncology         (336) [phone] ________________________________  Name: Brooke Wood MRN: [id number]  Date: 09/30/2013  DOB: 1966/02/14  Follow-Up Visit Note  CC: [name], MD  Deatra Robinson, MD  Diagnosis:   Cancer of upper-outer quadrant of female breast   Primary site: Breast (Right)   Staging method: AJCC 7th Edition   Clinical: Stage IIA (T2, N0, cM0)   Summary: Stage IIA (T2, N0, cM0)   Clinical comments: Staged at breast conference 12.11.13   Interval Since Last Radiation:  One year and 2 months  Narrative:  The patient returns today for routine follow-up.  She denies any pain in the right breast at this time. She does have a compression bra which has been helpful with her swelling in the right breast. She denies any nipple discharge or bleeding. She denies any problems with swelling in her right arm. She has had pain in her feet with standing and was to see orthopedic surgery but unable to make this appointment due to financial issues.  she is also due for a colonoscopy and will have social work meet with her later today to see if assistance can be obtained for both of these appointments.                               ALLERGIES:  is allergic to aspirin; codeine; and codeine phosphate.  Meds: Current Outpatient Prescriptions  Medication Sig Dispense Refill  . cetirizine (ZYRTEC) 10 MG tablet Take 10 mg by mouth daily.      . potassium chloride SA (K-DUR,KLOR-CON) 20 MEQ tablet TAKE 1 TABLET BY MOUTH THREE TIMES A DAY  90 tablet  11  . tamoxifen (NOLVADEX) 20 MG tablet Take 20 mg by mouth daily.      . tamoxifen (NOLVADEX) 20 MG tablet TAKE 1 TABLET BY MOUTH ONCE DAILY  90 tablet  12  . valsartan-hydrochlorothiazide (DIOVAN-HCT) 160-25 MG per tablet Take 1 tab QD  90 tablet  3   No current facility-administered medications for this encounter.    Physical Findings: The patient is in no acute distress. Patient is alert and oriented.  height is 5\' 5"  (1.651 m)  and weight is 151 lb 11.2 oz (68.811 kg). Her oral temperature is 98.3 F (36.8 C). Her blood pressure is 135/98 and her pulse is 93. .   no palpable supraclavicular or axillary adenopathy. The lungs are clear to auscultation. The heart has a regular rhythm and rate. Examination of the left breast reveals no mass or nipple discharge. Examination right breast reveals continued hyperpigmentation changes and some edema. There is no dominant mass appreciated breast nipple discharge or bleeding.   Lab Findings: Lab Results  Component Value Date   WBC 6.2 04/26/2013   HGB 12.8 04/26/2013   HCT 38.0 04/26/2013   MCV 90.1 04/26/2013   PLT 214 04/26/2013      Radiographic Findings: No results found.  Impression:  The patient is recovering from the effects of radiation.  no evidence of recurrence on clinical exam today  Plan:   routine followup in November. Patient will be seen by medical oncology in the interim.   ____________________________________ Blair Promise, MD

## 2013-09-30 NOTE — Addendum Note (Signed)
Encounter addended by: Jacqulyn Liner, RN on: 09/30/2013 11:16 AM<BR>     Documentation filed: Notes Section

## 2013-09-30 NOTE — Progress Notes (Signed)
Brooke Wood here for follow up after treatment to her right breast.  She denies pain now but has pain in her feet with standing.  She is taking tomxifen.  Her bp is elevated today at 135/98.  She reports that she is taking diovan/hct.  She reports a small amount of edema in her lower right arm.  She has hyperpigmentation on her right breast   She has a small bruise near her right shoulder that she says is from her compression bra.

## 2013-09-30 NOTE — Progress Notes (Signed)
Called Abby Potash, CSW to contact [facility] regarding her concerns about her insurance paying for her colonoscopy and having to pay money up front to see a foot doctor.  Abby Potash will call the patient.  Also called Dr. Virgie Dad nurse and left a message stating that Brooke Wood would like to make Dr. Jana Hakim her Medical Oncologist.  She would like to see Dr. Jana Hakim for her 10/28/13 appointment.

## 2013-10-04 ENCOUNTER — Encounter: Payer: Self-pay | Admitting: *Deleted

## 2013-10-04 NOTE — Progress Notes (Signed)
[place] Work  Clinical Social Work was referred by nurse for assessment of psychosocial needs due to no insurance and wanting assistance for coverage for future appointments. CSW not able to see pt on day she was here due to CSW seeing another pt. Clinical Social Worker has worked with pt in the past and has educated her on options to seek coverage through marketplace and other resources to assist with follow ups. CSW attempted to phone pt and discuss further, but pt does not have a vm box set up. CSW hopeful pt will return call.   Loren Racer, LCSW Clinical Social Worker [facility] Wednesday, Thursday and Friday Phone: [phone] Fax: [phone]

## 2013-10-05 ENCOUNTER — Telehealth: Payer: Self-pay

## 2013-10-05 ENCOUNTER — Encounter: Payer: Self-pay | Admitting: *Deleted

## 2013-10-05 NOTE — Telephone Encounter (Signed)
Per request of Delia Chimes Dr. Sondra Come, spoke with patient regarding appt 7/30.  Pt wants Dr. Jana Hakim as her primary oncologist.  Advised pt that GM is booked til late August and she would see an APP 7/30.  Pt is ok with that.  "Just wants to be sure he is her doctor".  Routed to Val.

## 2013-10-05 NOTE — Progress Notes (Signed)
[place] Work  Clinical Social Work received return call from pt. She continues to struggle with routine medical follow up and health maintenance due to no insurance. Pt reports her medicaid was cancelled and at [facility] they report she no longer qualifies. Pt can't afford health care through the exchange per her report. CSW problem solved with pt and discussed resources available for follow up care. Pt plans to look into Internal Medicine at [facility] as an option. CSW doesn't really have a lot of other ideas for pt currently. She plans to follow up at Internal Medicine or the [facility].    Clinical Social Work interventions: Resource education  Loren Racer, LCSW Clinical Social Worker [facility] [place] [facility] [place] Wednesday, Thursday and Friday Phone: [phone] Fax: [phone]

## 2013-10-07 ENCOUNTER — Telehealth: Payer: Self-pay | Admitting: Physician Assistant

## 2013-10-07 NOTE — Telephone Encounter (Signed)
, °

## 2013-10-27 ENCOUNTER — Other Ambulatory Visit: Payer: Self-pay | Admitting: *Deleted

## 2013-10-27 DIAGNOSIS — C50411 Malignant neoplasm of upper-outer quadrant of right female breast: Secondary | ICD-10-CM

## 2013-10-28 ENCOUNTER — Other Ambulatory Visit: Payer: Self-pay

## 2013-10-28 ENCOUNTER — Other Ambulatory Visit (HOSPITAL_BASED_OUTPATIENT_CLINIC_OR_DEPARTMENT_OTHER): Payer: Self-pay

## 2013-10-28 ENCOUNTER — Ambulatory Visit (HOSPITAL_BASED_OUTPATIENT_CLINIC_OR_DEPARTMENT_OTHER): Payer: Self-pay | Admitting: Nurse Practitioner

## 2013-10-28 ENCOUNTER — Ambulatory Visit: Payer: Self-pay | Admitting: Oncology

## 2013-10-28 ENCOUNTER — Telehealth: Payer: Self-pay | Admitting: Nurse Practitioner

## 2013-10-28 VITALS — BP 145/87 | HR 97 | Temp 98.2°F | Resp 18 | Ht 65.0 in | Wt 152.1 lb

## 2013-10-28 DIAGNOSIS — C50419 Malignant neoplasm of upper-outer quadrant of unspecified female breast: Secondary | ICD-10-CM

## 2013-10-28 DIAGNOSIS — Z170 Estrogen receptor positive status [ER+]: Secondary | ICD-10-CM

## 2013-10-28 DIAGNOSIS — C50411 Malignant neoplasm of upper-outer quadrant of right female breast: Secondary | ICD-10-CM

## 2013-10-28 DIAGNOSIS — E876 Hypokalemia: Secondary | ICD-10-CM

## 2013-10-28 LAB — CBC WITH DIFFERENTIAL/PLATELET
BASO%: 0.4 % (ref 0.0–2.0)
Basophils Absolute: 0 10*3/uL (ref 0.0–0.1)
EOS ABS: 0.1 10*3/uL (ref 0.0–0.5)
EOS%: 0.9 % (ref 0.0–7.0)
HCT: 39.3 % (ref 34.8–46.6)
HEMOGLOBIN: 13.1 g/dL (ref 11.6–15.9)
LYMPH#: 2.4 10*3/uL (ref 0.9–3.3)
LYMPH%: 34.7 % (ref 14.0–49.7)
MCH: 29.4 pg (ref 25.1–34.0)
MCHC: 33.3 g/dL (ref 31.5–36.0)
MCV: 88.3 fL (ref 79.5–101.0)
MONO#: 0.8 10*3/uL (ref 0.1–0.9)
MONO%: 11.4 % (ref 0.0–14.0)
NEUT%: 52.6 % (ref 38.4–76.8)
NEUTROS ABS: 3.7 10*3/uL (ref 1.5–6.5)
Platelets: 232 10*3/uL (ref 145–400)
RBC: 4.45 10*6/uL (ref 3.70–5.45)
RDW: 13.4 % (ref 11.2–14.5)
WBC: 7 10*3/uL (ref 3.9–10.3)

## 2013-10-28 LAB — COMPREHENSIVE METABOLIC PANEL (CC13)
ALT: 12 U/L (ref 0–55)
AST: 11 U/L (ref 5–34)
Albumin: 3.7 g/dL (ref 3.5–5.0)
Alkaline Phosphatase: 89 U/L (ref 40–150)
Anion Gap: 8 mEq/L (ref 3–11)
BUN: 12.4 mg/dL (ref 7.0–26.0)
CO2: 28 meq/L (ref 22–29)
Calcium: 9.6 mg/dL (ref 8.4–10.4)
Chloride: 103 mEq/L (ref 98–109)
Creatinine: 0.8 mg/dL (ref 0.6–1.1)
GLUCOSE: 97 mg/dL (ref 70–140)
POTASSIUM: 3.8 meq/L (ref 3.5–5.1)
Sodium: 139 mEq/L (ref 136–145)
Total Bilirubin: 0.27 mg/dL (ref 0.20–1.20)
Total Protein: 6.9 g/dL (ref 6.4–8.3)

## 2013-10-28 NOTE — Progress Notes (Addendum)
ID: Brooke Wood OB: [birth year]  MR#: [id number]  VZD#:[id number]  PCP: [name], MD GYN:   SU:  OTHER MD:  CHIEF COMPLAINT: Right invasive tubular lobular breast cancer  CURRENT THERAPY: Tamoxifen 49m PO daily  BREAST CANCER HISTORY:  TDeliahpalpated a lump in the right breast in early November 2013. [facility] and these were performed on 03/04/2012. The mass measured 2.8cm mammographically. The ultrasound showed a spiculated mass in the 11 o'clock location of the right breast, 8 cm from the nipple measuring 2.9 cm. The right axilla showed no adenopathy.   An ultrasound guided core biopsy of the right breast on 03/04/2012 revealed invasive ductal carcinoma, grade 2, ER positive, PR positive, HER-2/neu negative, Ki-67 20%. An MRI performed on 03/10/2012 showed the mass to be 2.7cm and irregular. No abnormalities were found in the left breast, or either axilla. Her case was discussed at the multidisciplinary breast clinic on 03/11/2012 and referred for genetic counseling and testing.   A right breast lumpectomy with sentinel lymph node biopsy was performed on 04/09/2012. 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 One sentinel node had isolated tumor cells. All margins clean.   The patient had an Oncotype DX performed. Her score was 10, giving her a 7% risk of distance recurrence with tamoxifen for 5 year. She was referred to radiation oncology. Her subsequent history is as follows.   INTERVAL HISTORY: [name] her for follow up of her right breast cancer, previously a patient of Dr. KHumphrey Rolls She has been obtaining her tamoxifen for $14/month but is only working part-time at [employer] is uninsured. She wishes to find a better price for these reasons. Her interval history is unremarkable. She is not currently exercise but  states that she is on her feet all day at work.   REVIEW OF SYSTEMS: [name] tolerating the tamoxifen well, her only complaint is the occasional hot flash, but they don't really bother her. Her right breast is "usually always swollen" but she regularly wears the compression bras recommended to her by physical therapy. She wears glasses but wonders if her vision is any worse. Without insurance she has not had an eye exam in "a while." Lastly, TJamarcomplains of intermittent right knee pain. This is usually exacerbated by a long work day.  PAST MEDICAL HISTORY: Past Medical History  Diagnosis Date  . Allergy     Neg RAST 2007  . Hypertension   . Vocal cord dysfunction     Neg Methacholine challenge test 2007  . Breast cancer     right breast  . Hx of radiation therapy 06/15/12 - 07/29/12    right rbeast, 50.4 Gy x 28 fx, boost to cumulative dose 60.4 gray    PAST SURGICAL HISTORY: Past Surgical History  Procedure Laterality Date  . Breast surgery  1985    rt br bx  . Breast lumpectomy with sentinel lymph node biopsy  04/09/2012    Procedure: BREAST LUMPECTOMY WITH SENTINEL LYMPH NODE BX;  Surgeon: PMerrie Roof MD;  [facility]  Service: General;  Laterality: Right;    FAMILY HISTORY Family History  Problem Relation Age of Onset  . Hypertension Other   . Alcohol abuse Neg Hx   . Diabetes Neg Hx   . Early death Neg Hx   .  Hearing loss Neg Hx   . Heart disease Neg Hx   . Hyperlipidemia Neg Hx   . Stroke Neg Hx   . Kidney disease Neg Hx   . Lung cancer Father 39    smoker  . Cancer Father     lung  Her mother is still alive in her 53s and is in good health. Her father passed away when she was a teenager from lung cancer. Her 3 brothers are younger, and have no significant health history that she can recall.   GYNECOLOGIC HISTORY:  Menarche occurred at age 4. The patient is GX P1, giving birth to her son about age 50-25. She is still menstruating.  She was on birth control pills for 10+ years.   SOCIAL HISTORY: Brooke Wood lives at home alone. 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 Her son, Brooke Wood is 82. He graduated with his masters degree in sports education and Mudlogger from [employer] in May 2015. Her mother, Brooke Wood, lives 5 minutes away and is supportive. 2 of her brothers reside in [place], the other in [place]. She was married to her son's father for 9-10 years, but they got divorced in her 71s. She does not currently have a significant other. She is a Psychologist, forensic but "still needs to find a good church home."     ADVANCED DIRECTIVES: Not in place at this time. She intends to name her son, Brooke Wood, as her healthcare power of attorney. "Brooke Wood" can be reached at [phone].    HEALTH MAINTENANCE: History  Substance Use Topics  . Smoking status: Never Smoker   . Smokeless tobacco: Never Used  . Alcohol Use: No    Colonoscopy: "needs to schedule when insurance obtained" Bone Density Scan: never Pap Smear: UTD/2013 Lipid Panel:    Allergies  Allergen Reactions  . Aspirin   . Codeine   . Codeine Phosphate     REACTION: unspecified    Current Outpatient Prescriptions  Medication Sig Dispense Refill  . cetirizine (ZYRTEC) 10 MG tablet Take 10 mg by mouth daily.      . potassium chloride SA (K-DUR,KLOR-CON) 20 MEQ tablet TAKE 1 TABLET BY MOUTH THREE TIMES A DAY/ Pt has been taking 1 tablet every day not three times a day as stated      . tamoxifen (NOLVADEX) 20 MG tablet Take 20 mg by mouth daily.      . valsartan-hydrochlorothiazide (DIOVAN-HCT) 160-25 MG per tablet Take 1 tab QD  90 tablet  3   No current facility-administered medications for this visit.    OBJECTIVE: Filed Vitals:   10/28/13 1439  BP: 145/87  Pulse: 97  Temp: 98.2 F (36.8 C)  Resp: 18      Body mass index is 25.31 kg/(m^2).   ROS   Physical Exam Sclerae unicteric, pupils equal and reactive Oropharynx clear and moist-- no thrush No cervical or supraclavicular adenopathy Lungs no rales or rhonchi Heart regular rate and rhythm Abd soft, nontender, positive bowel sounds MSK no focal spinal tenderness, no upper extremity lymphedema Neuro: nonfocal, well oriented, appropriate affect Breasts: Left breast unremarkable. Right breast darker than left, some swelling noted, no masses or nipple discharge. Right axilla benign.    ECOG FS:0 - Asymptomatic  LAB RESULTS: Lab Results  Component Value Date   WBC 7.0 10/28/2013   HGB  13.1 10/28/2013   HCT 39.3 10/28/2013   MCV 88.3 10/28/2013   PLT 232 10/28/2013     Chemistry      Component Value Date/Time   NA 139 10/28/2013 1427   NA 134* 11/24/2012 1040   K 3.8 10/28/2013 1427   K 3.3* 11/24/2012 1040   CL 99 11/24/2012 1040   CL 98 07/24/2012 1248   CO2 28 10/28/2013 1427   CO2 29 11/24/2012 1040   BUN 12.4 10/28/2013 1427   BUN 16 11/24/2012 1040   CREATININE 0.8 10/28/2013 1427   CREATININE 0.6 11/24/2012 1040      Component Value Date/Time   CALCIUM 9.6 10/28/2013 1427   CALCIUM 9.6 11/24/2012 1040   ALKPHOS 89 10/28/2013 1427   AST 11 10/28/2013 1427   ALT 12 10/28/2013 1427   BILITOT 0.27 10/28/2013 1427      Urinalysis No results found for this basename: colorurine, appearanceur, labspec, phurine, glucoseu, hgbur, bilirubinur, ketonesur, proteinur, urobilinogen, nitrite, leukocytesur    STUDIES: Most recent mammogram on 04/28/13 was unremarkable.   ASSESSMENT:  48 y.o. [place] woman   (1) status post right breast biopsy on 03/11/2012 for a clinical invasive ductal carcinoma with tubular features, grade 2, ER positive, PR positive, HER-2/neu negative, Ki-67 20%  (2) status post definitive right lumpectomy and sentinel lymph node biopsy on 04/09/2012. This revealed a 2.8 cm invasive tubular lobular carcinoma,T2 N0i,  stage IIA, grade 2, ER 99%, PR 4%, HER-2/neu negative, Ki-67 60%. One sentinel node had isolated tumor cells. All margins clean.   (3) Oncotype DX performed. Her score was 10, giving her a 7% risk of distance recurrence with tamoxifen for 5 year.   (4) She completed radiation therapy in April 2015  (5) She began tamoxifen in May 2014 with a plan to continue therapy for 10 years  PLAN:  See is doing well as far as breast cancer is concerned. She is tolerating tamoxifen well with little side effects. Her last period was just last week and was "normal."  She is not currently sexually active, but was advised to use condoms should that change.The plan is to continue the tamoxifen and she will be seen back in clinic in 6 months for another follow up. A paper prescription was given at this time with several pharmacy recommendations to obtain the drug for <$10.   The labs were reviewed in detail with the patient and were unremarkable. Her K+ remained stable at 3.8, though she admitted that she had only been taking the 63mq k-dur tablets once per day instead of TID. I told her that this was fine and that follow up labs in February 2016 would be drawn before her next appointment.  She was encouraged to become active for 30 minutes most days of the week. We also discussed to increasing her vegetable and water intake, and limiting sweets and processed foods. The patient was very eager to change her diet and assures me she will make this effort.   FMarcelino Duster NP  10/28/2013 4:58 PM

## 2013-10-29 DIAGNOSIS — E876 Hypokalemia: Secondary | ICD-10-CM | POA: Insufficient documentation

## 2013-10-29 NOTE — Assessment & Plan Note (Signed)
Brooke Wood is doing well as far as breast cancer is concerned. She is tolerating tamoxifen well with little side effects. Her last period was just last week and was "normal."  The plan is to continue the tamoxifen and she will be seen back in clinic in 6 months for another follow up. A paper prescription was given at this time with several pharmacy recommendations to obtain the drug for <$10.

## 2013-11-01 ENCOUNTER — Other Ambulatory Visit: Payer: Self-pay | Admitting: Internal Medicine

## 2013-11-05 ENCOUNTER — Encounter: Payer: Self-pay | Admitting: Nurse Practitioner

## 2013-11-10 ENCOUNTER — Other Ambulatory Visit: Payer: Self-pay | Admitting: Nurse Practitioner

## 2013-11-10 DIAGNOSIS — C50411 Malignant neoplasm of upper-outer quadrant of right female breast: Secondary | ICD-10-CM

## 2013-11-10 NOTE — Progress Notes (Signed)
Patient dropped in for a walk-in visit today requesting a meal plan for healthy weight loss. Suggested low fat/low carb diet. Referring to dietitian for more detailed instructions.

## 2013-11-16 ENCOUNTER — Ambulatory Visit: Payer: Self-pay | Admitting: Nutrition

## 2013-11-16 NOTE — Progress Notes (Signed)
Patient called requesting information about healthy diet for breast cancer.  Educated patient regarding importance of increasing plant-based foods, such as, vegetables, fruits, and whole gr.  Discouraged processed foods and high-fat diet.  Provided patient with sample options of meals.  Will mail fact sheets to patient on plant-based diet and nutrition for breast cancer survivors.  Provided contact information.  Teach back method used.  **Disclaimer: This note was dictated with voice recognition software. Similar sounding words can inadvertently be transcribed and this note may contain transcription errors which may not have been corrected upon publication of note.**

## 2013-12-02 ENCOUNTER — Telehealth: Payer: Self-pay | Admitting: *Deleted

## 2013-12-03 ENCOUNTER — Encounter: Payer: Self-pay | Admitting: Internal Medicine

## 2013-12-03 ENCOUNTER — Ambulatory Visit (INDEPENDENT_AMBULATORY_CARE_PROVIDER_SITE_OTHER): Payer: BC Managed Care – PPO | Admitting: Internal Medicine

## 2013-12-03 ENCOUNTER — Other Ambulatory Visit (INDEPENDENT_AMBULATORY_CARE_PROVIDER_SITE_OTHER): Payer: BC Managed Care – PPO

## 2013-12-03 VITALS — BP 112/76 | HR 87 | Temp 98.2°F | Wt 151.8 lb

## 2013-12-03 DIAGNOSIS — I10 Essential (primary) hypertension: Secondary | ICD-10-CM

## 2013-12-03 DIAGNOSIS — E049 Nontoxic goiter, unspecified: Secondary | ICD-10-CM

## 2013-12-03 DIAGNOSIS — D179 Benign lipomatous neoplasm, unspecified: Secondary | ICD-10-CM | POA: Insufficient documentation

## 2013-12-03 LAB — TSH: TSH: 2.12 u[IU]/mL (ref 0.35–4.50)

## 2013-12-03 NOTE — Progress Notes (Signed)
Pre visit review using our clinic review tool, if applicable. No additional management support is needed unless otherwise documented below in the visit note. 

## 2013-12-03 NOTE — Patient Instructions (Signed)
Please continue all other medications as before, and refills have been done if requested.  Please have the pharmacy call with any other refills you may need.  Please keep your appointments with your specialists as you may have planned  You will be contacted regarding the referral for: thyroid ultrasound  Please go to the LAB in the Basement (turn left off the elevator) for the tests to be done today - just the thyroid blood test

## 2013-12-03 NOTE — Progress Notes (Signed)
Subjective:    Patient ID: Brooke Wood, female    DOB: 12-25-1965, 48 y.o.   MRN: [id number]  HPI  Here to f/u ostensibly for left neck pain, but when here states no pain , but is concerned about swelling to the left anterior neck/upper chest area; no recent trauma, fever or skin change.  Not sure when this occurred, just noticed yesterday, mild but persistent. Nothing seems to make better, nothing makes worse.  Has been told sometime in the past she may have had a somewhat enlarged thyroid. Denies hyper or hypo thyroid symptoms such as voice, skin or hair change..Pt denies chest pain, increased sob or doe, wheezing, orthopnea, PND, increased LE swelling, palpitations, dizziness or syncope.  Pt denies new neurological symptoms such as new headache, or facial or extremity weakness or numbness Past Medical History  Diagnosis Date  . Allergy     Neg RAST 2007  . Hypertension   . Vocal cord dysfunction     Neg Methacholine challenge test 2007  . Breast cancer     right breast  . Hx of radiation therapy 06/15/12 - 07/29/12    right rbeast, 50.4 Gy x 28 fx, boost to cumulative dose 60.4 gray   Past Surgical History  Procedure Laterality Date  . Breast surgery  1985    rt br bx  . Breast lumpectomy with sentinel lymph node biopsy  04/09/2012    Procedure: BREAST LUMPECTOMY WITH SENTINEL LYMPH NODE BX;  Surgeon: Merrie Roof, MD;  Location: [facility];  Service: General;  Laterality: Right;    reports that she has never smoked. She has never used smokeless tobacco. She reports that she does not drink alcohol or use illicit drugs. family history includes Cancer in her father; Hypertension in her other; Lung cancer (age of onset: 73) in her father. There is no history of Alcohol abuse, Diabetes, Early death, Hearing loss, Heart disease, Hyperlipidemia, Stroke, or Kidney disease. Allergies  Allergen Reactions  . Aspirin   . Codeine   . Codeine Phosphate     REACTION: unspecified    Current Outpatient Prescriptions on File Prior to Visit  Medication Sig Dispense Refill  . cetirizine (ZYRTEC) 10 MG tablet Take 10 mg by mouth daily.      . potassium chloride SA (K-DUR,KLOR-CON) 20 MEQ tablet TAKE 1 TABLET BY MOUTH THREE TIMES A DAY/ Pt has been taking 1 tablet every day not three times a day as stated      . tamoxifen (NOLVADEX) 20 MG tablet Take 20 mg by mouth daily.      . valsartan-hydrochlorothiazide (DIOVAN-HCT) 160-25 MG per tablet TAKE 1 TABLET BY MOUTH ONCE DAILY  90 tablet  3   No current facility-administered medications on file prior to visit.   Review of Systems  Constitutional: Negative for unusual diaphoresis or other sweats  HENT: Negative for ringing in ear Eyes: Negative for double vision or worsening visual disturbance.  Respiratory: Negative for choking and stridor.   Gastrointestinal: Negative for vomiting or other signifcant bowel change Genitourinary: Negative for hematuria or decreased urine volume.  Musculoskeletal: Negative for other MSK pain or swelling Skin: Negative for color change and worsening wound.  Neurological: Negative for tremors and numbness other than noted  Psychiatric/Behavioral: Negative for decreased concentration or agitation other than above       Objective:   Physical Exam BP 112/76  Pulse 87  Temp(Src) 98.2 F (36.8 C) (Oral)  Wt 151 lb  12 oz (68.833 kg)  SpO2 98% VS noted,  Constitutional: Pt appears well-developed, well-nourished.  HENT: Head: NCAT.  Right Ear: External ear normal.  Left Ear: External ear normal.  Eyes: . Pupils are equal, round, and reactive to light. Conjunctivae and EOM are normal Neck: Normal range of motion. Neck supple. thyroid probably mild diffusely enlarged, no specific dominant nodule/mass or tender, no neck lymphadenopathy noted; does also have prob small to mod sized left supraclavicular lipoma like mass, nondiscrete, nontender Cardiovascular: Normal rate and regular rhythm.     Pulmonary/Chest: Effort normal and breath sounds normal.  Abd:  Soft, NT, ND, + BS Neurological: Pt is alert. Not confused , motor grossly intact Skin: Skin is warm. No rash Psychiatric: Pt behavior is normal. No agitation. But very nervous, extremely worried about cancer, with reiterations of explanation today of suspected diagnosis and evaluation difficult for her to comprehend due to anxiety     Assessment & Plan:

## 2013-12-04 NOTE — Assessment & Plan Note (Signed)
I think this is most likely the main reason pt thinks the area is swollen, d/w pt, ok to follow, if enlarges would need to consider gen surgury eval

## 2013-12-04 NOTE — Assessment & Plan Note (Signed)
stable overall by history and exam, recent data reviewed with pt, and pt to continue medical treatment as before,  to f/u any worsening symptoms or concerns BP Readings from Last 3 Encounters:  12/03/13 112/76  10/28/13 145/87  09/30/13 135/98

## 2013-12-04 NOTE — Assessment & Plan Note (Signed)
With ? 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

## 2013-12-07 ENCOUNTER — Telehealth: Payer: Self-pay | Admitting: Internal Medicine

## 2013-12-07 ENCOUNTER — Ambulatory Visit: Payer: Self-pay | Admitting: Internal Medicine

## 2013-12-07 NOTE — Telephone Encounter (Signed)
Patient informed of lab results per Dr. Jenny Reichmann

## 2013-12-07 NOTE — Telephone Encounter (Signed)
Pt request lab result that was done 12/03/13. Please call pt today if possible.

## 2013-12-13 ENCOUNTER — Encounter: Payer: Self-pay | Admitting: Radiation Oncology

## 2013-12-13 ENCOUNTER — Ambulatory Visit
Admission: RE | Admit: 2013-12-13 | Discharge: 2013-12-13 | Disposition: A | Discharge status: Home / Self Care | Payer: BC Managed Care – PPO | Source: Ambulatory Visit | Attending: Radiation Oncology | Admitting: Radiation Oncology

## 2013-12-13 VITALS — BP 124/86 | HR 82 | Temp 98.9°F | Ht 65.0 in | Wt 148.7 lb

## 2013-12-13 DIAGNOSIS — C50411 Malignant neoplasm of upper-outer quadrant of right female breast: Secondary | ICD-10-CM

## 2013-12-13 NOTE — Progress Notes (Signed)
Patient given aquaphor samples per Dr. Lisbeth Renshaw.

## 2013-12-13 NOTE — Progress Notes (Signed)
  Radiation Oncology         [phone] ________________________________  Name: Brooke Wood MRN: [id number]  Date: 12/13/2013  DOB: [birth year]  Follow-Up Visit Note  CC: [name], MD  [name], MD  Diagnosis:   Right-sided breast cancer  Interval Since Last Radiation:  She completed radiation treatment in April of 2014   Narrative:  The patient returns today for routine follow-up.  The patient came to clinic today complaining of some redness in the right breast region. She states that she noticed this last night. She denies any associated symptoms. No pain. No itching. She was concerned about this and came directly to clinic this morning. She has been treated by Dr. Nell Range and she was unaware that he was not in clinic today.nard                               ALLERGIES:  is allergic to aspirin; codeine; and codeine phosphate.  Meds: Current Outpatient Prescriptions  Medication Sig Dispense Refill  . cetirizine (ZYRTEC) 10 MG tablet Take 10 mg by mouth daily.      . potassium chloride SA (K-DUR,KLOR-CON) 20 MEQ tablet TAKE 1 TABLET BY MOUTH THREE TIMES A DAY/ Pt has been taking 1 tablet every day not three times a day as stated      . tamoxifen (NOLVADEX) 20 MG tablet Take 20 mg by mouth daily.      . valsartan-hydrochlorothiazide (DIOVAN-HCT) 160-25 MG per tablet TAKE 1 TABLET BY MOUTH ONCE DAILY  90 tablet  3   No current facility-administered medications for this encounter.    Physical Findings: The patient is in no acute distress. Patient is alert and oriented.  height is 5\' 5"  (1.651 m) and weight is 148 lb 11.2 oz (67.45 kg). Her oral temperature is 98.9 F (37.2 C). Her blood pressure is 124/86 and her pulse is 82. .   The patient still has hyperpigmentation in the breast area on the right. An area of erythema is present at the upper in her aspect of the treatment area and extending somewhat about the tangent field area. No dryness of skin. No other significant changes  in the breast area.  Lab Findings: Lab Results  Component Value Date   WBC 7.0 10/28/2013   HGB 13.1 10/28/2013   HCT 39.3 10/28/2013   MCV 88.3 10/28/2013   PLT 232 10/28/2013     Radiographic Findings: No results found.  Impression/Plan:    The patient came in today for a skin check some changes she was concerned about. I do not see anything really concerning today. She will follow this area over the next couple of days. She will let us know on Wednesday if this area persists and then she expressed an interest in seeing Dr.  Sondra Come if so later this week. If this area resolves then I do not believe that further workup is needed.  I spent 10 minutes with the patient today, the majority of which was spent counseling the patient on the diagnosis of cancer and coordinating care.   Jodelle Gross, M.D., Ph.D.

## 2013-12-13 NOTE — Progress Notes (Signed)
Zakaria Fromer here for follow up after treatment to her right breast.  She finished treatment in April 2014.  She denies pain.  She noticed yesterday that her right breast was red.  She denies using any lotions.  She reports it itches occasionally.  She is currently taking tamoxifen.  On inspections the upper portion of her breast is red.  She also has hyperpigmentation on her right breast.  She reports she has had swelling in her left neck and is going to have a thyroid ultrasound tomorrow.

## 2013-12-14 ENCOUNTER — Encounter: Payer: Self-pay | Admitting: Internal Medicine

## 2013-12-14 ENCOUNTER — Other Ambulatory Visit: Payer: Self-pay

## 2013-12-14 ENCOUNTER — Ambulatory Visit
Admission: RE | Admit: 2013-12-14 | Discharge: 2013-12-14 | Disposition: A | Discharge status: Home / Self Care | Payer: BC Managed Care – PPO | Source: Ambulatory Visit | Attending: Internal Medicine | Admitting: Internal Medicine

## 2013-12-14 DIAGNOSIS — E049 Nontoxic goiter, unspecified: Secondary | ICD-10-CM

## 2013-12-15 ENCOUNTER — Telehealth: Payer: Self-pay | Admitting: Internal Medicine

## 2013-12-15 NOTE — Telephone Encounter (Signed)
Pt called in requesting for a nurse to call her back today if possible, she is concerned with the results of the ultra sound? She would like to talk about results.     Thank you!

## 2013-12-16 ENCOUNTER — Ambulatory Visit
Admission: RE | Admit: 2013-12-16 | Discharge: 2013-12-16 | Disposition: A | Discharge status: Home / Self Care | Payer: BC Managed Care – PPO | Source: Ambulatory Visit | Attending: Radiation Oncology | Admitting: Radiation Oncology

## 2013-12-16 ENCOUNTER — Telehealth: Payer: Self-pay | Admitting: Oncology

## 2013-12-16 VITALS — BP 141/90 | HR 84 | Temp 99.1°F | Ht 65.0 in | Wt 150.7 lb

## 2013-12-16 DIAGNOSIS — Z510 Encounter for antineoplastic radiation therapy: Secondary | ICD-10-CM | POA: Diagnosis present

## 2013-12-16 DIAGNOSIS — C50411 Malignant neoplasm of upper-outer quadrant of right female breast: Secondary | ICD-10-CM

## 2013-12-16 DIAGNOSIS — C50919 Malignant neoplasm of unspecified site of unspecified female breast: Secondary | ICD-10-CM | POA: Diagnosis not present

## 2013-12-16 MED ORDER — RADIAPLEXRX EX GEL
Freq: Once | CUTANEOUS | Status: AC
  Administered 2013-12-16: 17:00:00 via TOPICAL

## 2013-12-16 NOTE — Progress Notes (Signed)
Brooke Wood here for follow up after treatment to her right breast.  She denies pain.  She has noticed swelling in her lower left neck area.  She had a thyroid ultrasound on 9/15 that shows 2 cysts versus nodules.  The skin on her right breast has hyperpigmentation.  She has redness on her upper right and left chest.

## 2013-12-16 NOTE — Progress Notes (Signed)
Radiaplex given per Dr. Sondra Come.  Instructed Brooke Wood to apply to red area on her right chest twice a day.  Zoua verbalized understanding.

## 2013-12-16 NOTE — Telephone Encounter (Signed)
De Burrs with no answer.  Her voice mail is not set up.

## 2013-12-16 NOTE — Telephone Encounter (Signed)
Pt notified of results

## 2013-12-17 ENCOUNTER — Encounter: Payer: Self-pay | Admitting: Radiation Oncology

## 2013-12-17 NOTE — Progress Notes (Signed)
Radiation Oncology         (336) [phone] ________________________________  Name: Brooke Wood MRN: [id number]  Date: 12/16/2013  DOB: Nov 13, 1965  Follow-Up Visit Note  CC: [name], MD  Deatra Robinson, MD  Diagnosis:   Clinical stage II right breast cancer  Interval Since Last Radiation:  One year and 4 months   Narrative:  The patient returns today for  follow-up.  She requested followup after noticing some erythema in the upper aspect of her right breast and upper chest region. She denies any itching in the area. She was seen by Dr. Lisbeth Renshaw September 14 a for the same issue. She denies any pain in the right breast area nipple discharge or bleeding. She did undergo ultrasound recently which showed benign changes in the thyroid area.                          ALLERGIES:  is allergic to aspirin; codeine; and codeine phosphate.  Meds: Current Outpatient Prescriptions  Medication Sig Dispense Refill  . cetirizine (ZYRTEC) 10 MG tablet Take 10 mg by mouth daily.      . potassium chloride SA (K-DUR,KLOR-CON) 20 MEQ tablet TAKE 1 TABLET BY MOUTH THREE TIMES A DAY/ Pt has been taking 1 tablet every day not three times a day as stated      . tamoxifen (NOLVADEX) 20 MG tablet Take 20 mg by mouth daily.      . valsartan-hydrochlorothiazide (DIOVAN-HCT) 160-25 MG per tablet TAKE 1 TABLET BY MOUTH ONCE DAILY  90 tablet  3   No current facility-administered medications for this encounter.    Physical Findings: The patient is in no acute distress. Patient is alert and oriented.  height is 5\' 5"  (1.651 m) and weight is 150 lb 11.2 oz (68.357 kg). Her oral temperature is 99.1 F (37.3 C). Her blood pressure is 141/90 and her pulse is 84. Marland Kitchen   No palpable supraclavicular or axillary adenopathy. The lungs are clear to auscultation. The heart has regular rhythm and rate. Examination of the left breast reveals no mass or nipple discharge. Examination right breast reveals some hyperpigmentation  changes. No dominant masses appreciated breast nipple discharge or bleeding. Patient has mild erythema in the upper inner aspect of the right chest and upper central chest area. No signs of infection. The patient has some mild swelling in the left supraclavicular fossa but no palpable mass or cysts or discomfort.  Lab Findings: Lab Results  Component Value Date   WBC 7.0 10/28/2013   HGB 13.1 10/28/2013   HCT 39.3 10/28/2013   MCV 88.3 10/28/2013   PLT 232 10/28/2013      Radiographic Findings: US Soft Tissue Head/neck  12/14/2013   CLINICAL DATA:  Followup thyroid goiter  EXAM: THYROID ULTRASOUND  TECHNIQUE: Ultrasound examination of the thyroid gland and adjacent soft tissues was performed.  COMPARISON:  Prior thyroid ultrasound 05/31/2011  FINDINGS: Right thyroid lobe  Measurements: 5.0 x 1.5 x 2.1 cm. Hypoechoic solid nodule in the periphery of the upper gland measures 5.6 mm which is in significantly changed compared to 5.2 mm previously.  Left thyroid lobe  Measurements: 5.0 x 1.7 x 2.0 cm. There are 2 tiny hypoechoic nodules within the upper and mid aspect of the gland measuring 3 and 4 mm respectively.  Isthmus  Thickness: 0.4 cm.  No nodules visualized.  Lymphadenopathy  None visualized.  IMPRESSION: 1. No interval change in the size of the  small (less than 1 cm) hypoechoic nodule in the upper right gland. 2. Two tiny (3 and 5 mm) hypoechoic cysts versus nodules noted within the left gland which were not previously measured. The imaging features are not suspicious. Findings do not meet current SRU consensus criteria for biopsy. Follow-up by clinical exam is recommended. If patient has known risk factors for thyroid carcinoma, consider follow-up ultrasound in 12 months. If patient is clinically hyperthyroid, consider nuclear medicine thyroid uptake and scan.  Reference: Management of Thyroid Nodules Detected at [place]: Society of Radiologists in [place]. Radiology  2005; [id number].   Electronically Signed   By: Jacqulynn Cadet M.D.   On: 12/14/2013 14:53    Impression:  Clinically NED. The patient was reassured  Plan:  The patient will keep her already scheduled followup appointment  ____________________________________ Blair Promise, MD

## 2013-12-27 ENCOUNTER — Ambulatory Visit: Payer: BC Managed Care – PPO | Admitting: Internal Medicine

## 2013-12-30 ENCOUNTER — Ambulatory Visit
Admission: RE | Admit: 2013-12-30 | Discharge: 2013-12-30 | Disposition: A | Discharge status: Home / Self Care | Payer: BC Managed Care – PPO | Source: Ambulatory Visit | Attending: Radiation Oncology | Admitting: Radiation Oncology

## 2013-12-30 ENCOUNTER — Encounter: Payer: Self-pay | Admitting: Radiation Oncology

## 2013-12-30 VITALS — BP 127/91 | HR 92 | Temp 99.1°F | Resp 12 | Ht 65.0 in | Wt 148.9 lb

## 2013-12-30 DIAGNOSIS — C50411 Malignant neoplasm of upper-outer quadrant of right female breast: Secondary | ICD-10-CM

## 2013-12-30 NOTE — Progress Notes (Signed)
Brooke Wood here for follow up after treatment to her right breast.  She denies pain.  She is taking tamoxifen.  The skin on her right breast has hyperpigmentation with a slight red area on her mid right chest.  She is using radiaplex gel once a day.  She reports it is not swollen.  She reports that she was given flagyl yesterday by Dr. Philis Pique  for 7 days for a yeast infection.

## 2013-12-30 NOTE — Progress Notes (Signed)
  Radiation Oncology         (336) [phone] ________________________________  Name: Brooke Wood MRN: [id number]  Date: 12/30/2013  DOB: 1966/01/24  Follow-Up Visit Note  CC: Brooke Calico, MD  Brooke Lima, MD  Diagnosis: Clinical stage II right breast cancer     Interval Since Last Radiation:  One year 4-1/2 months   Narrative:  The patient returns today for close followup. She was noted to have some erythema in the upper-inner aspect of the right breast. Since her last followup 2 weeks ago she denies any chills or fever. She denies any nipple discharge or bleeding or pain within the breast area.                            ALLERGIES:  is allergic to aspirin; codeine; and codeine phosphate.  Meds: Current Outpatient Prescriptions  Medication Sig Dispense Refill  . cetirizine (ZYRTEC) 10 MG tablet Take 10 mg by mouth daily.      . metroNIDAZOLE (FLAGYL) 500 MG tablet       . potassium chloride SA (K-DUR,KLOR-CON) 20 MEQ tablet TAKE 1 TABLET BY MOUTH THREE TIMES A DAY/ Pt has been taking 1 tablet every day not three times a day as stated      . tamoxifen (NOLVADEX) 20 MG tablet Take 20 mg by mouth daily.      . valsartan-hydrochlorothiazide (DIOVAN-HCT) 160-25 MG per tablet TAKE 1 TABLET BY MOUTH ONCE DAILY  90 tablet  3   No current facility-administered medications for this encounter.    Physical Findings: The patient is in no acute distress. Patient is alert and oriented.  height is 5\' 5"  (1.651 m) and weight is 148 lb 14.4 oz (67.541 kg). Her oral temperature is 99.1 F (37.3 C). Her blood pressure is 127/91 and her pulse is 92. Her respiration is 12. . No palpable subclavicular or axillary adenopathy. Lungs are clear to auscultation. The heart has a regular rhythm and rate. Summation of the left breast reveals no mass or nipple discharge. Examination of the right breast reveals some mild hyperpigmentation changes and edema. No dominant masses appreciated breast nipple discharge  or bleeding. Patient has some slight erythema in the upper-inner aspect of the right breast. No signs of infection  Lab Findings: Lab Results  Component Value Date   WBC 7.0 10/28/2013   HGB 13.1 10/28/2013   HCT 39.3 10/28/2013   MCV 88.3 10/28/2013   PLT 232 10/28/2013       Impression:  No evidence of recurrence on clinical exam today.  Plan:  Routine followup in 4 months  ____________________________________ Blair Promise, MD

## 2014-01-03 ENCOUNTER — Ambulatory Visit (INDEPENDENT_AMBULATORY_CARE_PROVIDER_SITE_OTHER): Payer: BC Managed Care – PPO | Admitting: Internal Medicine

## 2014-01-03 ENCOUNTER — Encounter: Payer: Self-pay | Admitting: Internal Medicine

## 2014-01-03 ENCOUNTER — Other Ambulatory Visit (INDEPENDENT_AMBULATORY_CARE_PROVIDER_SITE_OTHER): Payer: BC Managed Care – PPO

## 2014-01-03 ENCOUNTER — Telehealth: Payer: Self-pay | Admitting: Internal Medicine

## 2014-01-03 ENCOUNTER — Ambulatory Visit (INDEPENDENT_AMBULATORY_CARE_PROVIDER_SITE_OTHER)
Admission: RE | Admit: 2014-01-03 | Discharge: 2014-01-03 | Disposition: A | Discharge status: Home / Self Care | Payer: BC Managed Care – PPO | Source: Ambulatory Visit | Attending: Internal Medicine | Admitting: Internal Medicine

## 2014-01-03 VITALS — BP 120/80 | HR 92 | Temp 98.5°F | Resp 16 | Ht 65.0 in | Wt 146.0 lb

## 2014-01-03 DIAGNOSIS — I10 Essential (primary) hypertension: Secondary | ICD-10-CM

## 2014-01-03 DIAGNOSIS — M25552 Pain in left hip: Secondary | ICD-10-CM

## 2014-01-03 DIAGNOSIS — E049 Nontoxic goiter, unspecified: Secondary | ICD-10-CM

## 2014-01-03 DIAGNOSIS — Z23 Encounter for immunization: Secondary | ICD-10-CM

## 2014-01-03 DIAGNOSIS — Z0000 Encounter for general adult medical examination without abnormal findings: Secondary | ICD-10-CM

## 2014-01-03 DIAGNOSIS — E785 Hyperlipidemia, unspecified: Secondary | ICD-10-CM

## 2014-01-03 DIAGNOSIS — E876 Hypokalemia: Secondary | ICD-10-CM

## 2014-01-03 LAB — COMPREHENSIVE METABOLIC PANEL
ALBUMIN: 4.3 g/dL (ref 3.5–5.2)
ALK PHOS: 68 U/L (ref 39–117)
ALT: 18 U/L (ref 0–35)
AST: 18 U/L (ref 0–37)
BUN: 11 mg/dL (ref 6–23)
CO2: 23 meq/L (ref 19–32)
Calcium: 9.1 mg/dL (ref 8.4–10.5)
Chloride: 101 mEq/L (ref 96–112)
Creatinine, Ser: 0.6 mg/dL (ref 0.4–1.2)
GFR: 130.71 mL/min (ref >60.00)
Glucose, Bld: 86 mg/dL (ref 70–99)
Potassium: 3.9 mEq/L (ref 3.5–5.1)
SODIUM: 133 meq/L — AB (ref 135–145)
Total Bilirubin: 0.4 mg/dL (ref 0.2–1.2)
Total Protein: 7.7 g/dL (ref 6.0–8.3)

## 2014-01-03 LAB — CBC WITH DIFFERENTIAL/PLATELET
Basophils Absolute: 0 10*3/uL (ref 0.0–0.1)
Basophils Relative: 0.5 % (ref 0.0–3.0)
EOS ABS: 0 10*3/uL (ref 0.0–0.7)
EOS PCT: 0.4 % (ref 0.0–5.0)
HCT: 42.6 % (ref 36.0–46.0)
Hemoglobin: 13.9 g/dL (ref 12.0–15.0)
Lymphocytes Relative: 31.7 % (ref 12.0–46.0)
Lymphs Abs: 2 10*3/uL (ref 0.7–4.0)
MCHC: 32.7 g/dL (ref 30.0–36.0)
MCV: 90 fl (ref 78.0–100.0)
MONO ABS: 0.8 10*3/uL (ref 0.1–1.0)
Monocytes Relative: 13.1 % — ABNORMAL HIGH (ref 3.0–12.0)
Neutro Abs: 3.4 10*3/uL (ref 1.4–7.7)
Neutrophils Relative %: 54.3 % (ref 43.0–77.0)
Platelets: 243 10*3/uL (ref 150.0–400.0)
RBC: 4.74 Mil/uL (ref 3.87–5.11)
RDW: 13.6 % (ref 11.5–15.5)
WBC: 6.3 10*3/uL (ref 4.0–10.5)

## 2014-01-03 LAB — TSH: TSH: 1.57 u[IU]/mL (ref 0.35–4.50)

## 2014-01-03 LAB — BASIC METABOLIC PANEL
BUN: 11 mg/dL (ref 6–23)
CO2: 23 mEq/L (ref 19–32)
CREATININE: 0.6 mg/dL (ref 0.4–1.2)
Calcium: 9.1 mg/dL (ref 8.4–10.5)
Chloride: 101 mEq/L (ref 96–112)
GFR: 130.71 mL/min (ref >60.00)
Glucose, Bld: 86 mg/dL (ref 70–99)
POTASSIUM: 3.9 meq/L (ref 3.5–5.1)
Sodium: 133 mEq/L — ABNORMAL LOW (ref 135–145)

## 2014-01-03 LAB — LIPID PANEL
CHOLESTEROL: 128 mg/dL (ref 0–200)
HDL: 46.9 mg/dL (ref >39.00)
LDL CALC: 67 mg/dL (ref 0–99)
NonHDL: 81.1
Total CHOL/HDL Ratio: 3
Triglycerides: 73 mg/dL (ref 0.0–149.0)
VLDL: 14.6 mg/dL (ref 0.0–40.0)

## 2014-01-03 LAB — T3, FREE: T3 FREE: 2.9 pg/mL (ref 2.3–4.2)

## 2014-01-03 LAB — T4, FREE: Free T4: 0.9 ng/dL (ref 0.60–1.60)

## 2014-01-03 NOTE — Telephone Encounter (Signed)
Patient would like xray results.

## 2014-01-03 NOTE — Assessment & Plan Note (Signed)
She is due for an FLP today

## 2014-01-03 NOTE — Progress Notes (Signed)
Pre visit review using our clinic review tool, if applicable. No additional management support is needed unless otherwise documented below in the visit note. 

## 2014-01-03 NOTE — Assessment & Plan Note (Signed)
Her BP is well controlled Will monitor her lytes and renal function 

## 2014-01-03 NOTE — Progress Notes (Signed)
Subjective:    Patient ID: Brooke Wood, female    DOB: 15-Jan-1966, 48 y.o.   MRN: [id number]  Arthritis Presents for follow-up visit. She complains of pain and stiffness. She reports no joint swelling or joint warmth. Affected locations include the left hip, left knee and right knee. Her pain is at a severity of 2/10. Pertinent negatives include no diarrhea, dry eyes, dry mouth, dysuria, fatigue, fever, pain at night, pain while resting, rash, Raynaud's syndrome, uveitis or weight loss. Her past medical history is significant for osteoarthritis. Her pertinent risk factors include overuse. Past treatments include nothing. The treatment provided no relief. Factors aggravating her arthritis include activity. Compliance with prior treatments has been poor.      Review of Systems  Constitutional: Negative.  Negative for fever, chills, weight loss, diaphoresis, activity change, appetite change, fatigue and unexpected weight change.  HENT: Negative.   Eyes: Negative.   Respiratory: Negative.  Negative for apnea, cough, choking, chest tightness, shortness of breath, wheezing and stridor.   Cardiovascular: Negative.  Negative for chest pain, palpitations and leg swelling.  Gastrointestinal: Negative.  Negative for nausea, vomiting, abdominal pain, diarrhea, constipation and blood in stool.  Endocrine: Negative.   Genitourinary: Negative.  Negative for dysuria.  Musculoskeletal: Positive for arthralgias, arthritis and stiffness. Negative for back pain, gait problem, joint swelling, myalgias, neck pain and neck stiffness.  Skin: Negative.  Negative for rash.  Allergic/Immunologic: Negative.   Neurological: Negative.  Negative for dizziness, tremors, weakness, light-headedness, numbness and headaches.  Hematological: Negative.  Negative for adenopathy. Does not bruise/bleed easily.  Psychiatric/Behavioral: Negative.        Objective:   Physical Exam  Vitals reviewed. Constitutional: She is  oriented to person, place, and time. She appears well-developed and well-nourished. No distress.  HENT:  Head: Normocephalic and atraumatic.  Mouth/Throat: Oropharynx is clear and moist. No oropharyngeal exudate.  Eyes: Conjunctivae are normal. Right eye exhibits no discharge. Left eye exhibits no discharge. No scleral icterus.  Neck: Normal range of motion. Neck supple. No JVD present. No tracheal deviation present. No thyromegaly present.  Cardiovascular: Normal rate, regular rhythm, normal heart sounds and intact distal pulses.  Exam reveals no gallop and no friction rub.   No murmur heard. Pulmonary/Chest: Effort normal and breath sounds normal. No stridor. No respiratory distress. She has no wheezes. She has no rales. She exhibits no tenderness.  Abdominal: Soft. Bowel sounds are normal. She exhibits no distension and no mass. There is no tenderness. There is no rebound and no guarding.  Musculoskeletal: Normal range of motion. She exhibits no edema and no tenderness.       Left hip: Normal. She exhibits normal range of motion, normal strength, no tenderness, no bony tenderness, no swelling, no crepitus, no deformity and no laceration.       Right knee: Normal.       Left knee: Normal.  Lymphadenopathy:    She has no cervical adenopathy.  Neurological: She is oriented to person, place, and time.  Skin: Skin is warm and dry. No rash noted. She is not diaphoretic. No erythema. No pallor.  Psychiatric: She has a normal mood and affect. Her behavior is normal. Judgment and thought content normal.     Lab Results  Component Value Date   WBC 7.0 10/28/2013   HGB 13.1 10/28/2013   HCT 39.3 10/28/2013   PLT 232 10/28/2013   GLUCOSE 97 10/28/2013   CHOL 167 11/24/2012   TRIG 123.0 11/24/2012  HDL 52.70 11/24/2012   LDLCALC 90 11/24/2012   ALT 12 10/28/2013   AST 11 10/28/2013   NA 139 10/28/2013   K 3.8 10/28/2013   CL 99 11/24/2012   CREATININE 0.8 10/28/2013   BUN 12.4 10/28/2013   CO2 28  10/28/2013   TSH 2.12 12/03/2013   HGBA1C 6.1 11/24/2012       Assessment & Plan:

## 2014-01-03 NOTE — Assessment & Plan Note (Addendum)
Exam done She was given a flu vax Labs ordered She was referred for a colonoscopy Pt ed material was given

## 2014-01-03 NOTE — Assessment & Plan Note (Signed)
She will try tylenol for the pain I will check a plain film to see if there is DJD

## 2014-01-03 NOTE — Patient Instructions (Signed)
Preventive Care for Adults A healthy lifestyle and preventive care can promote health and wellness. Preventive health guidelines for women include the following key practices.  A routine yearly physical is a good way to check with your health care provider about your health and preventive screening. It is a chance to share any concerns and updates on your health and to receive a thorough exam.  Visit your dentist for a routine exam and preventive care every 6 months. Brush your teeth twice a day and floss once a day. Good oral hygiene prevents tooth decay and gum disease.  The frequency of eye exams is based on your age, health, family medical history, use of contact lenses, and other factors. Follow your health care provider's recommendations for frequency of eye exams.  Eat a healthy diet. Foods like vegetables, fruits, whole grains, low-fat dairy products, and lean protein foods contain the nutrients you need without too many calories. Decrease your intake of foods high in solid fats, added sugars, and salt. Eat the right amount of calories for you.Get information about a proper diet from your health care provider, if necessary.  Regular physical exercise is one of the most important things you can do for your health. Most adults should get at least 150 minutes of moderate-intensity exercise (any activity that increases your heart rate and causes you to sweat) each week. In addition, most adults need muscle-strengthening exercises on 2 or more days a week.  Maintain a healthy weight. The body mass index (BMI) is a screening tool to identify possible weight problems. It provides an estimate of body fat based on height and weight. Your health care provider can find your BMI and can help you achieve or maintain a healthy weight.For adults 20 years and older:  A BMI below 18.5 is considered underweight.  A BMI of 18.5 to 24.9 is normal.  A BMI of 25 to 29.9 is considered overweight.  A BMI of  30 and above is considered obese.  Maintain normal blood lipids and cholesterol levels by exercising and minimizing your intake of saturated fat. Eat a balanced diet with plenty of fruit and vegetables. Blood tests for lipids and cholesterol should begin at age 76 and be repeated every 5 years. If your lipid or cholesterol levels are high, you are over 50, or you are at high risk for heart disease, you may need your cholesterol levels checked more frequently.Ongoing high lipid and cholesterol levels should be treated with medicines if diet and exercise are not working.  If you smoke, find out from your health care provider how to quit. If you do not use tobacco, do not start.  Lung cancer screening is recommended for adults aged 22-80 years who are at high risk for developing lung cancer because of a history of smoking. A yearly low-dose CT scan of the lungs is recommended for people who have at least a 30-pack-year history of smoking and are a current smoker or have quit within the past 15 years. A pack year of smoking is smoking an average of 1 pack of cigarettes a day for 1 year (for example: 1 pack a day for 30 years or 2 packs a day for 15 years). Yearly screening should continue until the smoker has stopped smoking for at least 15 years. Yearly screening should be stopped for people who develop a health problem that would prevent them from having lung cancer treatment.  If you are pregnant, do not drink alcohol. If you are breastfeeding,  be very cautious about drinking alcohol. If you are not pregnant and choose to drink alcohol, do not have more than 1 drink per day. One drink is considered to be 12 ounces (355 mL) of beer, 5 ounces (148 mL) of wine, or 1.5 ounces (44 mL) of liquor.  Avoid use of street drugs. Do not share needles with anyone. Ask for help if you need support or instructions about stopping the use of drugs.  High blood pressure causes heart disease and increases the risk of  stroke. Your blood pressure should be checked at least every 1 to 2 years. Ongoing high blood pressure should be treated with medicines if weight loss and exercise do not work.  If you are 3-86 years old, ask your health care provider if you should take aspirin to prevent strokes.  Diabetes screening involves taking a blood sample to check your fasting blood sugar level. This should be done once every 3 years, after age 67, if you are within normal weight and without risk factors for diabetes. Testing should be considered at a younger age or be carried out more frequently if you are overweight and have at least 1 risk factor for diabetes.  Breast cancer screening is essential preventive care for women. You should practice "breast self-awareness." This means understanding the normal appearance and feel of your breasts and may include breast self-examination. Any changes detected, no matter how small, should be reported to a health care provider. Women in their 8s and 30s should have a clinical breast exam (CBE) by a health care provider as part of a regular health exam every 1 to 3 years. After age 70, women should have a CBE every year. Starting at age 25, women should consider having a mammogram (breast X-ray test) every year. Women who have a family history of breast cancer should talk to their health care provider about genetic screening. Women at a high risk of breast cancer should talk to their health care providers about having an MRI and a mammogram every year.  Breast cancer gene (BRCA)-related cancer risk assessment is recommended for women who have family members with BRCA-related cancers. BRCA-related cancers include breast, ovarian, tubal, and peritoneal cancers. Having family members with these cancers may be associated with an increased risk for harmful changes (mutations) in the breast cancer genes BRCA1 and BRCA2. Results of the assessment will determine the need for genetic counseling and  BRCA1 and BRCA2 testing.  Routine pelvic exams to screen for cancer are no longer recommended for nonpregnant women who are considered low risk for cancer of the pelvic organs (ovaries, uterus, and vagina) and who do not have symptoms. Ask your health care provider if a screening pelvic exam is right for you.  If you have had past treatment for cervical cancer or a condition that could lead to cancer, you need Pap tests and screening for cancer for at least 20 years after your treatment. If Pap tests have been discontinued, your risk factors (such as having a new sexual partner) need to be reassessed to determine if screening should be resumed. Some women have medical problems that increase the chance of getting cervical cancer. In these cases, your health care provider may recommend more frequent screening and Pap tests.  The HPV test is an additional test that may be used for cervical cancer screening. The HPV test looks for the virus that can cause the cell changes on the cervix. The cells collected during the Pap test can be  tested for HPV. The HPV test could be used to screen women aged 30 years and older, and should be used in women of any age who have unclear Pap test results. After the age of 30, women should have HPV testing at the same frequency as a Pap test.  Colorectal cancer can be detected and often prevented. Most routine colorectal cancer screening begins at the age of 50 years and continues through age 75 years. However, your health care provider may recommend screening at an earlier age if you have risk factors for colon cancer. On a yearly basis, your health care provider may provide home test kits to check for hidden blood in the stool. Use of a small camera at the end of a tube, to directly examine the colon (sigmoidoscopy or colonoscopy), can detect the earliest forms of colorectal cancer. Talk to your health care provider about this at age 50, when routine screening begins. Direct  exam of the colon should be repeated every 5-10 years through age 75 years, unless early forms of pre-cancerous polyps or small growths are found.  People who are at an increased risk for hepatitis B should be screened for this virus. You are considered at high risk for hepatitis B if:  You were born in a country where hepatitis B occurs often. Talk with your health care provider about which countries are considered high risk.  Your parents were born in a high-risk country and you have not received a shot to protect against hepatitis B (hepatitis B vaccine).  You have HIV or AIDS.  You use needles to inject street drugs.  You live with, or have sex with, someone who has hepatitis B.  You get hemodialysis treatment.  You take certain medicines for conditions like cancer, organ transplantation, and autoimmune conditions.  Hepatitis C blood testing is recommended for all people born from 1945 through 1965 and any individual with known risks for hepatitis C.  Practice safe sex. Use condoms and avoid high-risk sexual practices to reduce the spread of sexually transmitted infections (STIs). STIs include gonorrhea, chlamydia, syphilis, trichomonas, herpes, HPV, and human immunodeficiency virus (HIV). Herpes, HIV, and HPV are viral illnesses that have no cure. They can result in disability, cancer, and death.  You should be screened for sexually transmitted illnesses (STIs) including gonorrhea and chlamydia if:  You are sexually active and are younger than 24 years.  You are older than 24 years and your health care provider tells you that you are at risk for this type of infection.  Your sexual activity has changed since you were last screened and you are at an increased risk for chlamydia or gonorrhea. Ask your health care provider if you are at risk.  If you are at risk of being infected with HIV, it is recommended that you take a prescription medicine daily to prevent HIV infection. This is  called preexposure prophylaxis (PrEP). You are considered at risk if:  You are a heterosexual woman, are sexually active, and are at increased risk for HIV infection.  You take drugs by injection.  You are sexually active with a partner who has HIV.  Talk with your health care provider about whether you are at high risk of being infected with HIV. If you choose to begin PrEP, you should first be tested for HIV. You should then be tested every 3 months for as long as you are taking PrEP.  Osteoporosis is a disease in which the bones lose minerals and strength   with aging. This can result in serious bone fractures or breaks. The risk of osteoporosis can be identified using a bone density scan. Women ages 65 years and over and women at risk for fractures or osteoporosis should discuss screening with their health care providers. Ask your health care provider whether you should take a calcium supplement or vitamin D to reduce the rate of osteoporosis.  Menopause can be associated with physical symptoms and risks. Hormone replacement therapy is available to decrease symptoms and risks. You should talk to your health care provider about whether hormone replacement therapy is right for you.  Use sunscreen. Apply sunscreen liberally and repeatedly throughout the day. You should seek shade when your shadow is shorter than you. Protect yourself by wearing long sleeves, pants, a wide-brimmed hat, and sunglasses year round, whenever you are outdoors.  Once a month, do a whole body skin exam, using a mirror to look at the skin on your back. Tell your health care provider of new moles, moles that have irregular borders, moles that are larger than a pencil eraser, or moles that have changed in shape or color.  Stay current with required vaccines (immunizations).  Influenza vaccine. All adults should be immunized every year.  Tetanus, diphtheria, and acellular pertussis (Td, Tdap) vaccine. Pregnant women should  receive 1 dose of Tdap vaccine during each pregnancy. The dose should be obtained regardless of the length of time since the last dose. Immunization is preferred during the 27th-36th week of gestation. An adult who has not previously received Tdap or who does not know her vaccine status should receive 1 dose of Tdap. This initial dose should be followed by tetanus and diphtheria toxoids (Td) booster doses every 10 years. Adults with an unknown or incomplete history of completing a 3-dose immunization series with Td-containing vaccines should begin or complete a primary immunization series including a Tdap dose. Adults should receive a Td booster every 10 years.  Varicella vaccine. An adult without evidence of immunity to varicella should receive 2 doses or a second dose if she has previously received 1 dose. Pregnant females who do not have evidence of immunity should receive the first dose after pregnancy. This first dose should be obtained before leaving the health care facility. The second dose should be obtained 4-8 weeks after the first dose.  Human papillomavirus (HPV) vaccine. Females aged 13-26 years who have not received the vaccine previously should obtain the 3-dose series. The vaccine is not recommended for use in pregnant females. However, pregnancy testing is not needed before receiving a dose. If a female is found to be pregnant after receiving a dose, no treatment is needed. In that case, the remaining doses should be delayed until after the pregnancy. Immunization is recommended for any person with an immunocompromised condition through the age of 26 years if she did not get any or all doses earlier. During the 3-dose series, the second dose should be obtained 4-8 weeks after the first dose. The third dose should be obtained 24 weeks after the first dose and 16 weeks after the second dose.  Zoster vaccine. One dose is recommended for adults aged 60 years or older unless certain conditions are  present.  Measles, mumps, and rubella (MMR) vaccine. Adults born before 1957 generally are considered immune to measles and mumps. Adults born in 1957 or later should have 1 or more doses of MMR vaccine unless there is a contraindication to the vaccine or there is laboratory evidence of immunity to   each of the three diseases. A routine second dose of MMR vaccine should be obtained at least 28 days after the first dose for students attending postsecondary schools, health care workers, or international travelers. People who received inactivated measles vaccine or an unknown type of measles vaccine during 1963-1967 should receive 2 doses of MMR vaccine. People who received inactivated mumps vaccine or an unknown type of mumps vaccine before 1979 and are at high risk for mumps infection should consider immunization with 2 doses of MMR vaccine. For females of childbearing age, rubella immunity should be determined. If there is no evidence of immunity, females who are not pregnant should be vaccinated. If there is no evidence of immunity, females who are pregnant should delay immunization until after pregnancy. Unvaccinated health care workers born before 1957 who lack laboratory evidence of measles, mumps, or rubella immunity or laboratory confirmation of disease should consider measles and mumps immunization with 2 doses of MMR vaccine or rubella immunization with 1 dose of MMR vaccine.  Pneumococcal 13-valent conjugate (PCV13) vaccine. When indicated, a person who is uncertain of her immunization history and has no record of immunization should receive the PCV13 vaccine. An adult aged 19 years or older who has certain medical conditions and has not been previously immunized should receive 1 dose of PCV13 vaccine. This PCV13 should be followed with a dose of pneumococcal polysaccharide (PPSV23) vaccine. The PPSV23 vaccine dose should be obtained at least 8 weeks after the dose of PCV13 vaccine. An adult aged 19  years or older who has certain medical conditions and previously received 1 or more doses of PPSV23 vaccine should receive 1 dose of PCV13. The PCV13 vaccine dose should be obtained 1 or more years after the last PPSV23 vaccine dose.  Pneumococcal polysaccharide (PPSV23) vaccine. When PCV13 is also indicated, PCV13 should be obtained first. All adults aged 65 years and older should be immunized. An adult younger than age 65 years who has certain medical conditions should be immunized. Any person who resides in a nursing home or long-term care facility should be immunized. An adult smoker should be immunized. People with an immunocompromised condition and certain other conditions should receive both PCV13 and PPSV23 vaccines. People with human immunodeficiency virus (HIV) infection should be immunized as soon as possible after diagnosis. Immunization during chemotherapy or radiation therapy should be avoided. Routine use of PPSV23 vaccine is not recommended for American Indians, [place] Natives, or people younger than 65 years unless there are medical conditions that require PPSV23 vaccine. When indicated, people who have unknown immunization and have no record of immunization should receive PPSV23 vaccine. One-time revaccination 5 years after the first dose of PPSV23 is recommended for people aged 19-64 years who have chronic kidney failure, nephrotic syndrome, asplenia, or immunocompromised conditions. People who received 1-2 doses of PPSV23 before age 65 years should receive another dose of PPSV23 vaccine at age 65 years or later if at least 5 years have passed since the previous dose. Doses of PPSV23 are not needed for people immunized with PPSV23 at or after age 65 years.  Meningococcal vaccine. Adults with asplenia or persistent complement component deficiencies should receive 2 doses of quadrivalent meningococcal conjugate (MenACWY-D) vaccine. The doses should be obtained at least 2 months apart.  Microbiologists working with certain meningococcal bacteria, military recruits, people at risk during an outbreak, and people who travel to or live in countries with a high rate of meningitis should be immunized. A first-year college student up through age   21 years who is living in a residence hall should receive a dose if she did not receive a dose on or after her 16th birthday. Adults who have certain high-risk conditions should receive one or more doses of vaccine.  Hepatitis A vaccine. Adults who wish to be protected from this disease, have certain high-risk conditions, work with hepatitis A-infected animals, work in hepatitis A research labs, or travel to or work in countries with a high rate of hepatitis A should be immunized. Adults who were previously unvaccinated and who anticipate close contact with an international adoptee during the first 60 days after arrival in the [place] from a country with a high rate of hepatitis A should be immunized.  Hepatitis B vaccine. Adults who wish to be protected from this disease, have certain high-risk conditions, may be exposed to blood or other infectious body fluids, are household contacts or sex partners of hepatitis B positive people, are clients or workers in certain care facilities, or travel to or work in countries with a high rate of hepatitis B should be immunized.  Haemophilus influenzae type b (Hib) vaccine. A previously unvaccinated person with asplenia or sickle cell disease or having a scheduled splenectomy should receive 1 dose of Hib vaccine. Regardless of previous immunization, a recipient of a hematopoietic stem cell transplant should receive a 3-dose series 6-12 months after her successful transplant. Hib vaccine is not recommended for adults with HIV infection. Preventive Services / Frequency Ages 64 to 68 years  Blood pressure check.** / Every 1 to 2 years.  Lipid and cholesterol check.** / Every 5 years beginning at age  22.  Clinical breast exam.** / Every 3 years for women in their 88s and 53s.  BRCA-related cancer risk assessment.** / For women who have family members with a BRCA-related cancer (breast, ovarian, tubal, or peritoneal cancers).  Pap test.** / Every 2 years from ages 90 through 51. Every 3 years starting at age 21 through age 56 or 3 with a history of 3 consecutive normal Pap tests.  HPV screening.** / Every 3 years from ages 24 through ages 1 to 46 with a history of 3 consecutive normal Pap tests.  Hepatitis C blood test.** / For any individual with known risks for hepatitis C.  Skin self-exam. / Monthly.  Influenza vaccine. / Every year.  Tetanus, diphtheria, and acellular pertussis (Tdap, Td) vaccine.** / Consult your health care provider. Pregnant women should receive 1 dose of Tdap vaccine during each pregnancy. 1 dose of Td every 10 years.  Varicella vaccine.** / Consult your health care provider. Pregnant females who do not have evidence of immunity should receive the first dose after pregnancy.  HPV vaccine. / 3 doses over 6 months, if 72 and younger. The vaccine is not recommended for use in pregnant females. However, pregnancy testing is not needed before receiving a dose.  Measles, mumps, rubella (MMR) vaccine.** / You need at least 1 dose of MMR if you were born in 1957 or later. You may also need a 2nd dose. For females of childbearing age, rubella immunity should be determined. If there is no evidence of immunity, females who are not pregnant should be vaccinated. If there is no evidence of immunity, females who are pregnant should delay immunization until after pregnancy.  Pneumococcal 13-valent conjugate (PCV13) vaccine.** / Consult your health care provider.  Pneumococcal polysaccharide (PPSV23) vaccine.** / 1 to 2 doses if you smoke cigarettes or if you have certain conditions.  Meningococcal vaccine.** /  1 dose if you are age 19 to 21 years and a first-year college  student living in a residence hall, or have one of several medical conditions, you need to get vaccinated against meningococcal disease. You may also need additional booster doses.  Hepatitis A vaccine.** / Consult your health care provider.  Hepatitis B vaccine.** / Consult your health care provider.  Haemophilus influenzae type b (Hib) vaccine.** / Consult your health care provider. Ages 40 to 64 years  Blood pressure check.** / Every 1 to 2 years.  Lipid and cholesterol check.** / Every 5 years beginning at age 20 years.  Lung cancer screening. / Every year if you are aged 55-80 years and have a 30-pack-year history of smoking and currently smoke or have quit within the past 15 years. Yearly screening is stopped once you have quit smoking for at least 15 years or develop a health problem that would prevent you from having lung cancer treatment.  Clinical breast exam.** / Every year after age 40 years.  BRCA-related cancer risk assessment.** / For women who have family members with a BRCA-related cancer (breast, ovarian, tubal, or peritoneal cancers).  Mammogram.** / Every year beginning at age 40 years and continuing for as long as you are in good health. Consult with your health care provider.  Pap test.** / Every 3 years starting at age 30 years through age 65 or 70 years with a history of 3 consecutive normal Pap tests.  HPV screening.** / Every 3 years from ages 30 years through ages 65 to 70 years with a history of 3 consecutive normal Pap tests.  Fecal occult blood test (FOBT) of stool. / Every year beginning at age 50 years and continuing until age 75 years. You may not need to do this test if you get a colonoscopy every 10 years.  Flexible sigmoidoscopy or colonoscopy.** / Every 5 years for a flexible sigmoidoscopy or every 10 years for a colonoscopy beginning at age 50 years and continuing until age 75 years.  Hepatitis C blood test.** / For all people born from 1945 through  1965 and any individual with known risks for hepatitis C.  Skin self-exam. / Monthly.  Influenza vaccine. / Every year.  Tetanus, diphtheria, and acellular pertussis (Tdap/Td) vaccine.** / Consult your health care provider. Pregnant women should receive 1 dose of Tdap vaccine during each pregnancy. 1 dose of Td every 10 years.  Varicella vaccine.** / Consult your health care provider. Pregnant females who do not have evidence of immunity should receive the first dose after pregnancy.  Zoster vaccine.** / 1 dose for adults aged 60 years or older.  Measles, mumps, rubella (MMR) vaccine.** / You need at least 1 dose of MMR if you were born in 1957 or later. You may also need a 2nd dose. For females of childbearing age, rubella immunity should be determined. If there is no evidence of immunity, females who are not pregnant should be vaccinated. If there is no evidence of immunity, females who are pregnant should delay immunization until after pregnancy.  Pneumococcal 13-valent conjugate (PCV13) vaccine.** / Consult your health care provider.  Pneumococcal polysaccharide (PPSV23) vaccine.** / 1 to 2 doses if you smoke cigarettes or if you have certain conditions.  Meningococcal vaccine.** / Consult your health care provider.  Hepatitis A vaccine.** / Consult your health care provider.  Hepatitis B vaccine.** / Consult your health care provider.  Haemophilus influenzae type b (Hib) vaccine.** / Consult your health care provider. Ages 65   years and over  Blood pressure check.** / Every 1 to 2 years.  Lipid and cholesterol check.** / Every 5 years beginning at age 29 years.  Lung cancer screening. / Every year if you are aged 93-80 years and have a 30-pack-year history of smoking and currently smoke or have quit within the past 15 years. Yearly screening is stopped once you have quit smoking for at least 15 years or develop a health problem that would prevent you from having lung cancer  treatment.  Clinical breast exam.** / Every year after age 50 years.  BRCA-related cancer risk assessment.** / For women who have family members with a BRCA-related cancer (breast, ovarian, tubal, or peritoneal cancers).  Mammogram.** / Every year beginning at age 43 years and continuing for as long as you are in good health. Consult with your health care provider.  Pap test.** / Every 3 years starting at age 90 years through age 62 or 58 years with 3 consecutive normal Pap tests. Testing can be stopped between 65 and 70 years with 3 consecutive normal Pap tests and no abnormal Pap or HPV tests in the past 10 years.  HPV screening.** / Every 3 years from ages 32 years through ages 54 or 59 years with a history of 3 consecutive normal Pap tests. Testing can be stopped between 65 and 70 years with 3 consecutive normal Pap tests and no abnormal Pap or HPV tests in the past 10 years.  Fecal occult blood test (FOBT) of stool. / Every year beginning at age 39 years and continuing until age 79 years. You may not need to do this test if you get a colonoscopy every 10 years.  Flexible sigmoidoscopy or colonoscopy.** / Every 5 years for a flexible sigmoidoscopy or every 10 years for a colonoscopy beginning at age 49 years and continuing until age 21 years.  Hepatitis C blood test.** / For all people born from [birth year] through [birth year] and any individual with known risks for hepatitis C.  Osteoporosis screening.** / A one-time screening for women ages 55 years and over and women at risk for fractures or osteoporosis.  Skin self-exam. / Monthly.  Influenza vaccine. / Every year.  Tetanus, diphtheria, and acellular pertussis (Tdap/Td) vaccine.** / 1 dose of Td every 10 years.  Varicella vaccine.** / Consult your health care provider.  Zoster vaccine.** / 1 dose for adults aged 2 years or older.  Pneumococcal 13-valent conjugate (PCV13) vaccine.** / Consult your health care provider.  Pneumococcal  polysaccharide (PPSV23) vaccine.** / 1 dose for all adults aged 73 years and older.  Meningococcal vaccine.** / Consult your health care provider.  Hepatitis A vaccine.** / Consult your health care provider.  Hepatitis B vaccine.** / Consult your health care provider.  Haemophilus influenzae type b (Hib) vaccine.** / Consult your health care provider. ** Family history and personal history of risk and conditions may change your health care provider's recommendations. Document Released: 05/14/2001 Document Revised: 08/02/2013 Document Reviewed: 08/13/2010 [facility] Patient Information 2015 [place], [place]. This information is not intended to replace advice given to you by your health care provider. Make sure you discuss any questions you have with your health care provider. Osteoarthritis Osteoarthritis is a disease that causes soreness and inflammation of a joint. It occurs when the cartilage at the affected joint wears down. Cartilage acts as a cushion, covering the ends of bones where they meet to form a joint. Osteoarthritis is the most common form of arthritis. It often occurs in older  people. The joints affected most often by this condition include those in the:  Ends of the fingers.  Thumbs.  Neck.  Lower back.  Knees.  Hips. CAUSES  Over time, the cartilage that covers the ends of bones begins to wear away. This causes bone to rub on bone, producing pain and stiffness in the affected joints.  RISK FACTORS Certain factors can increase your chances of having osteoarthritis, including:  Older age.  Excessive body weight.  Overuse of joints.  Previous joint injury. SIGNS AND SYMPTOMS   Pain, swelling, and stiffness in the joint.  Over time, the joint may lose its normal shape.  Small deposits of bone (osteophytes) may grow on the edges of the joint.  Bits of bone or cartilage can break off and float inside the joint space. This may cause more pain and  damage. DIAGNOSIS  Your health care provider will do a physical exam and ask about your symptoms. Various tests may be ordered, such as:  X-rays of the affected joint.  An MRI scan.  Blood tests to rule out other types of arthritis.  Joint fluid tests. This involves using a needle to draw fluid from the joint and examining the fluid under a microscope. TREATMENT  Goals of treatment are to control pain and improve joint function. Treatment plans may include:  A prescribed exercise program that allows for rest and joint relief.  A weight control plan.  Pain relief techniques, such as:  Properly applied heat and cold.  Electric pulses delivered to nerve endings under the skin (transcutaneous electrical nerve stimulation [TENS]).  Massage.  Certain nutritional supplements.  Medicines to control pain, such as:  Acetaminophen.  Nonsteroidal anti-inflammatory drugs (NSAIDs), such as naproxen.  Narcotic or central-acting agents, such as tramadol.  Corticosteroids. These can be given orally or as an injection.  Surgery to reposition the bones and relieve pain (osteotomy) or to remove loose pieces of bone and cartilage. Joint replacement may be needed in advanced states of osteoarthritis. HOME CARE INSTRUCTIONS   Take medicines only as directed by your health care provider.  Maintain a healthy weight. Follow your health care provider's instructions for weight control. This may include dietary instructions.  Exercise as directed. Your health care provider can recommend specific types of exercise. These may include:  Strengthening exercises. These are done to strengthen the muscles that support joints affected by arthritis. They can be performed with weights or with exercise bands to add resistance.  Aerobic activities. These are exercises, such as brisk walking or low-impact aerobics, that get your heart pumping.  Range-of-motion activities. These keep your joints  limber.  Balance and agility exercises. These help you maintain daily living skills.  Rest your affected joints as directed by your health care provider.  Keep all follow-up visits as directed by your health care provider. SEEK MEDICAL CARE IF:   Your skin turns red.  You develop a rash in addition to your joint pain.  You have worsening joint pain.  You have a fever along with joint or muscle aches. SEEK IMMEDIATE MEDICAL CARE IF:  You have a significant loss of weight or appetite.  You have night sweats. [place] of Arthritis and Musculoskeletal and Skin Diseases: www.niams.SouthExposed.es  [employer] on Aging: http://kim-miller.com/  American College of Rheumatology: www.rheumatology.org Document Released: 03/18/2005 Document Revised: 08/02/2013 Document Reviewed: 11/23/2012 [facility] Patient Information 2015 [place], [place]. This information is not intended to replace advice given to you by your  health care provider. Make sure you discuss any questions you have with your health care provider.

## 2014-01-04 ENCOUNTER — Encounter: Payer: Self-pay | Admitting: Internal Medicine

## 2014-01-04 NOTE — Telephone Encounter (Signed)
LMOVM

## 2014-01-24 ENCOUNTER — Telehealth: Payer: Self-pay | Admitting: Oncology

## 2014-01-24 NOTE — Telephone Encounter (Signed)
Called Hayle back and advised her to wait for a few days per Dr. Sondra Come to see if the redness resolves.  Advised her that I woul

## 2014-01-24 NOTE — Telephone Encounter (Signed)
Brooke Wood called and said she was at work today and noticed that her right upper breast was itchy.  She also said her upper right breast was red.  She denies having a fever and said her breast is not swollen.  Advised her that Dr. Sondra Come will be notified.

## 2014-01-26 ENCOUNTER — Telehealth: Payer: Self-pay | Admitting: Oncology

## 2014-01-26 NOTE — Telephone Encounter (Signed)
Brooke Wood called and said that her breast is not red anymore.  She thinks it may be her hair that is irritating the area on her breast.  She had her hair relaxed over the weekend and Tuesday was when she wore her hair down for the first time and noticed the irritation.  Advised her to keep her hair back over her shoulder to avoid any irritation and to call if she needs anything.  Brooke Wood verbalized agreement.

## 2014-01-31 ENCOUNTER — Encounter: Payer: Self-pay | Admitting: Internal Medicine

## 2014-02-07 ENCOUNTER — Ambulatory Visit (AMBULATORY_SURGERY_CENTER): Payer: Self-pay | Admitting: *Deleted

## 2014-02-07 VITALS — Ht 65.0 in | Wt 149.0 lb

## 2014-02-07 DIAGNOSIS — Z1211 Encounter for screening for malignant neoplasm of colon: Secondary | ICD-10-CM

## 2014-02-07 NOTE — Progress Notes (Signed)
Patient denies any allergies to eggs or soy. Patient has nausea after anesthesia. Patient denies any oxygen use at home and does not take any diet/weight loss medications. EMMI education assisgned to patient on colonoscopy, this was explained and instructions given to patient.

## 2014-02-16 ENCOUNTER — Ambulatory Visit: Payer: BC Managed Care – PPO | Attending: General Surgery | Admitting: Physical Therapy

## 2014-02-16 DIAGNOSIS — M62411 Contracture of muscle, right shoulder: Secondary | ICD-10-CM | POA: Diagnosis not present

## 2014-02-16 DIAGNOSIS — C50911 Malignant neoplasm of unspecified site of right female breast: Secondary | ICD-10-CM | POA: Diagnosis not present

## 2014-02-16 DIAGNOSIS — M25511 Pain in right shoulder: Secondary | ICD-10-CM | POA: Diagnosis not present

## 2014-02-16 DIAGNOSIS — Z5189 Encounter for other specified aftercare: Secondary | ICD-10-CM | POA: Insufficient documentation

## 2014-02-16 NOTE — Therapy (Signed)
Physical Therapy Evaluation  Patient Details  Name: Brooke Wood MRN: [id number] Date of Birth: 03/26/1966  Encounter Date: 02/16/2014      PT End of Session - 02/16/14 1107    Visit Number 1   Number of Visits 8   PT Start Time 4098   PT Stop Time 1100   PT Time Calculation (min) 45 min      Past Medical History  Diagnosis Date  . Allergy     Neg RAST 2007  . Hypertension   . Vocal cord dysfunction     Neg Methacholine challenge test 2007  . Breast cancer     right breast  . Hx of radiation therapy 06/15/12 - 07/29/12    right rbeast, 50.4 Gy x 28 fx, boost to cumulative dose 60.4 gray    Past Surgical History  Procedure Laterality Date  . Breast surgery  1985    rt br bx  . Breast lumpectomy with sentinel lymph node biopsy  04/09/2012    Procedure: BREAST LUMPECTOMY WITH SENTINEL LYMPH NODE BX;  Surgeon: Merrie Roof, MD;  Location: [place];  Service: General;  Laterality: Right;    LMP 01/08/2014  Visit Diagnosis:  Right shoulder pain - Plan: PT plan of care cert/re-cert  Contracture of muscle of right shoulder - Plan: PT plan of care cert/re-cert      Subjective Assessment - 02/16/14 1021    Symptoms pt is here because of pain in right upper arm   Currently in Pain? Yes   Pain Score 8    Pain Descriptors / Indicators Sore   Pain Type Acute pain   Pain Onset 1 to 4 weeks ago   Pain Frequency Constant   Aggravating Factors  when she lift it and picks up stuff   Pain Relieving Factors when she doesn't use it   Effect of Pain on Daily Activities has to lift a bucket of water at work and she wonders if that is making her arm worse          [facility] PT Assessment - 02/16/14 0001    Assessment   Medical Diagnosis right breast cancer   Onset Date 01/31/12   Precautions   Precautions Other (comment)   Precaution Comments cancer precautions   Restrictions   Weight Bearing Restrictions No   Balance Screen   Has the patient fallen in the  past 6 months No   Has the patient had a decrease in activity level because of a fear of falling?  No   Is the patient reluctant to leave their home because of a fear of falling?  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   AROM   Right Shoulder Flexion 103 Degrees   Right Shoulder ABduction 94 Degrees   Right Shoulder Internal Rotation 24 Degrees   Right Shoulder External Rotation 85 Degrees   Left Shoulder Flexion 170 Degrees   Left Shoulder  ABduction 168 Degrees   Left Shoulder Internal Rotation 85 Degrees   Left Shoulder External Rotation 50 Degrees          [facility] Adult PT Treatment/Exercise - 02/16/14 0001    Shoulder Exercises: Supine   Shoulder Flexion Weight (lbs) bilateral flexion with dowel rod   Other Supine Exercises neural stretch  with arm abducted, wrist and fingers extended     Quick Dash Score 22.73           Plan - 02/16/14 1109    Clinical Impression Statement 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  She received a Tg soft for mild compression to upper arm and found this to ve very helpful..     Pt will benefit from skilled therapeutic intervention in order to improve on the following deficits Pain;Increased edema;Decreased range of motion;Increased fascial  restricitons;Decreased scar mobility   Rehab Potential Excellent   PT Frequency 2x / week   PT Duration 4 weeks   PT Treatment/Interventions Manual techniques;Manual lymph drainage;Therapeutic exercise;Scar mobilization;Passive range of motion   PT Next Visit Plan manual lymph drainage, soft tissue moblizaion and myofascial release to tight tissues assess her compression garment   Consulted and Agree with Plan of Care Patient        Problem List Patient Active Problem List   Diagnosis Date Noted  . Left hip pain 01/03/2014  . Hypokalemia 10/29/2013  . Routine general medical examination at a health care facility 06/03/2013  . DJD (degenerative joint disease) of knee 11/24/2012  . Hyperlipidemia with target LDL less than 130 11/24/2012  . Cancer of upper-outer quadrant of female breast 03/06/2012  . Goiter 07/26/2009  . VOCAL CORD DISORDER 07/24/2009  . Essential hypertension, benign 01/19/2007  . ALLERGIC RHINITIS 01/19/2007            LYMPHEDEMA/ONCOLOGY QUESTIONNAIRE - 02/16/14 1028    Type   Cancer Type right breast cancer   Surgeries   Lumpectomy Date 04/09/12   Number Lymph Nodes Removed 2   Date Lymphedema/Swelling Started   Date 02/02/14   Treatment   Active Chemotherapy Treatment No   Past Chemotherapy Treatment No   Active Radiation Treatment No   Past Radiation Treatment Yes   Date 06/16/12   Body Site right chest and underarm   Current Hormone Treatment Yes   What other symptoms do you have   Are you Having Heaviness or Tightness No   Are you having Pain Yes   Are you having pitting edema No   Is it Hard or Difficult finding clothes that fit No   Do you have infections No   Is there Decreased scar mobility Yes   Stemmer Sign No   Other Symptoms axillary webbing noted in axilla and    Right Upper Extremity Lymphedema   15 cm Proximal to Olecranon Process 27.9 cm   10 cm Proximal to Olecranon Process 25.6 cm   Olecranon Process 22.3 cm   15 cm  Proximal to Ulnar Styloid Process 22.4 cm   10 cm Proximal to Ulnar Styloid Process 18.5 cm   Just Proximal to Ulnar Styloid Process 14.4 cm   Across Hand at [employer] 17 cm   At [place] of 2nd Digit 5.5 cm   Left Upper Extremity Lymphedema   15 cm Proximal  to Olecranon Process 27.3 cm   10 cm Proximal to Olecranon Process 25.2 cm   Olecranon Process 22.2 cm   15 cm Proximal to Ulnar Styloid Process 22.5 cm   10 cm Proximal to Ulnar Styloid Process 18.5 cm   Just Proximal to Ulnar Styloid Process 14.2 cm   Across Hand at [employer] 16.7 cm   At [place] of 2nd Digit 5.4 cm            Short Term Clinic Goals - 02/16/14 1758    CC Short Term Goal  #1   Title pt will report at least a 25 percent decrease in pain in right arm.   Time 4   Period Weeks   CC Short Term Goal  #2   Title pt will have active right shoulder flexion of at least 140 degrees for more functional use of right arm   Time 4   Period Weeks   CC Short Term Goal  #3   Title pt will have active right shoulder abduction of at least 140 degrees for more functional use of right arm   Time 4   Period [facility] Goals - 02/16/14 1758    CC Long Term Goal  #1   Title patient will report at least 50 percent reduction of pain in her right arm   Time 8   Period Weeks   Status New   CC Long Term Goal  #2   Title patient will verbalize understanding in lymphedema risk reduction and use of compression as needed   Time 8   Period Weeks   Status New   CC Long Term Goal  #3   Title pt will be independent in a home program for range of motion and strength   Time 8   Period Weeks   Status New   CC Long Term Goal  #4   Title pt will have active  right shoulder flexion range of motion to > 120 for more funtional use of right arm   Time 8   Period Weeks   Status New   CC Long Term Goal  #5   Title pt will have active right shoulder abduction range of motion of > 120 degrees for more  functional use of right arm   Time 8   Period Weeks   Status New          [place], [place] 02/16/2014, 6:16 PM

## 2014-02-18 ENCOUNTER — Ambulatory Visit: Payer: BC Managed Care – PPO | Admitting: Physical Therapy

## 2014-02-18 DIAGNOSIS — M62411 Contracture of muscle, right shoulder: Secondary | ICD-10-CM

## 2014-02-18 DIAGNOSIS — Z5189 Encounter for other specified aftercare: Secondary | ICD-10-CM | POA: Diagnosis not present

## 2014-02-18 NOTE — Therapy (Signed)
Physical Therapy Treatment  Patient Details  Name: Brooke Wood MRN: [id number] Date of Birth: 01-04-1966  Encounter Date: 02/18/2014      PT End of Session - 02/18/14 1234    Visit Number 2   Number of Visits 9   Date for PT Re-Evaluation 03/17/14      Past Medical History  Diagnosis Date  . Allergy     Neg RAST 2007  . Hypertension   . Vocal cord dysfunction     Neg Methacholine challenge test 2007  . Breast cancer     right breast  . Hx of radiation therapy 06/15/12 - 07/29/12    right rbeast, 50.4 Gy x 28 fx, boost to cumulative dose 60.4 gray    Past Surgical History  Procedure Laterality Date  . Breast surgery  1985    rt br bx  . Breast lumpectomy with sentinel lymph node biopsy  04/09/2012    Procedure: BREAST LUMPECTOMY WITH SENTINEL LYMPH NODE BX;  Surgeon: Merrie Roof, MD;  Location: [place];  Service: General;  Laterality: Right;    LMP 01/08/2014  Visit Diagnosis:  Contracture of muscle of right shoulder      Subjective Assessment - 02/18/14 0850    Symptoms Nothing new.  Pain is better since wearing the TG soft.   Currently in Pain? Yes   Pain Score 5    Pain Location Arm   Pain Orientation Right;Upper   Pain Descriptors / Indicators Sore   Aggravating Factors  picking up a bucket of water or pulling trashcans outside at work   Pain Relieving Factors TG soft stockinette for light compression            [facility] Adult PT Treatment/Exercise - 02/18/14 0001    Manual Therapy   Manual Therapy Manual Lymphatic Drainage (MLD);Passive ROM   Passive ROM Right shoulder PROM with stretch to tolerance into ER, abduction, flexion, horizontal abduction; right UE neural tension stretch; gentle massage of cording at right upper arm with stretch.     Also:  Checked fit of patient's right compression sleeve, which fits fine.  Although she reported feeling it is tight, it is not particularly tight, especially at superior aspect, though it  did appear to hold her arm.  She was given instruction in how to don it properly to avoid stretching it out by pulling up on the top of it.           Plan - 02/18/14 1236    Clinical Impression Statement Cording evident with treatment today; pt. reported tenderness with palpation.   PT Next Visit Plan Continue PROM, myofascial therapy, manual lymph drainage prn.        Problem List Patient Active Problem List   Diagnosis Date Noted  . Left hip pain 01/03/2014  . Hypokalemia 10/29/2013  . Routine general medical examination at a health care facility 06/03/2013  . DJD (degenerative joint disease) of knee 11/24/2012  . Hyperlipidemia with target LDL less than 130 11/24/2012  . Cancer of upper-outer quadrant of female breast 03/06/2012  . Goiter 07/26/2009  . VOCAL CORD DISORDER 07/24/2009  . Essential hypertension, benign 01/19/2007  . ALLERGIC RHINITIS 01/19/2007      Chayson Charters, PT 02/18/2014, 12:38 PM

## 2014-02-21 ENCOUNTER — Encounter: Payer: BC Managed Care – PPO | Admitting: Physical Therapy

## 2014-02-22 ENCOUNTER — Ambulatory Visit: Payer: BC Managed Care – PPO

## 2014-02-22 DIAGNOSIS — Z5189 Encounter for other specified aftercare: Secondary | ICD-10-CM | POA: Diagnosis not present

## 2014-02-22 DIAGNOSIS — M25511 Pain in right shoulder: Secondary | ICD-10-CM

## 2014-02-22 DIAGNOSIS — M62411 Contracture of muscle, right shoulder: Secondary | ICD-10-CM

## 2014-02-22 NOTE — Therapy (Signed)
Physical Therapy Treatment  Patient Details  Name: Brooke Wood MRN: [id number] Date of Birth: 1965/11/03  Encounter Date: 02/22/2014      PT End of Session - 02/22/14 1110    Visit Number 3   Number of Visits 9   Date for PT Re-Evaluation 03/17/14   PT Start Time 1106   PT Stop Time 1148   PT Time Calculation (min) 42 min      Past Medical History  Diagnosis Date  . Allergy     Neg RAST 2007  . Hypertension   . Vocal cord dysfunction     Neg Methacholine challenge test 2007  . Breast cancer     right breast  . Hx of radiation therapy 06/15/12 - 07/29/12    right rbeast, 50.4 Gy x 28 fx, boost to cumulative dose 60.4 gray    Past Surgical History  Procedure Laterality Date  . Breast surgery  1985    rt br bx  . Breast lumpectomy with sentinel lymph node biopsy  04/09/2012    Procedure: BREAST LUMPECTOMY WITH SENTINEL LYMPH NODE BX;  Surgeon: Luella Cook III, MD;  Location: [place];  Service: General;  Laterality: Right;    LMP 01/08/2014  Visit Diagnosis:  Contracture of muscle of right shoulder  Right shoulder pain          [facility] Adult PT Treatment/Exercise - 02/22/14 0001    Manual Therapy   Manual Therapy Myofascial release;Passive ROM   Myofascial Release To Rt axilla and anterior elbow at cording, UE pulling throughout PROM.   Passive ROM Supine into Rt shoulder flexion, abduction and neural tension stretch instructing patient throughout to work these into her ADLs, all stretching to patients tolerance.                Plan - 02/22/14 1156    Clinical Impression Statement Cording still evident during treatment today and patient with increased sensitivity at cord until she was able to better relax, then noticed an improved stretch.   Pt will benefit from skilled therapeutic intervention in order to improve on the following deficits Pain;Increased edema;Decreased range of motion;Increased fascial restricitons;Decreased scar  mobility   Rehab Potential Excellent   PT Frequency 2x / week   PT Duration 4 weeks   PT Treatment/Interventions Manual techniques;Manual lymph drainage;Therapeutic exercise;Scar mobilization;Passive range of motion   PT Next Visit Plan Continue PROM, myofascial therapy, manual lymph drainage prn.   Consulted and Agree with Plan of Care Patient        Problem List Patient Active Problem List   Diagnosis Date Noted  . Left hip pain 01/03/2014  . Hypokalemia 10/29/2013  . Routine general medical examination at a health care facility 06/03/2013  . DJD (degenerative joint disease) of knee 11/24/2012  . Hyperlipidemia with target LDL less than 130 11/24/2012  . Cancer of upper-outer quadrant of female breast 03/06/2012  . Goiter 07/26/2009  . VOCAL CORD DISORDER 07/24/2009  . Essential hypertension, benign 01/19/2007  . ALLERGIC RHINITIS 01/19/2007                                          Short Term Clinic Goals - 02/22/14 1158    CC Short Term Goal  #1   Title pt will report at least a 25 percent decrease in pain in right arm.   Time 4  Period Weeks   Status On-going   CC Short Term Goal  #2   Title pt will have active right shoulder flexion of at least 140 degrees for more functional use of right arm   Time 4   Period Weeks   Status On-going   CC Short Term Goal  #3   Title pt will have active right shoulder abduction of at least 140 degrees for more functional use of right arm   Time 4   Period Weeks   Status On-going            Otelia Limes, PTA 02/22/2014, 11:59 AM

## 2014-02-28 ENCOUNTER — Encounter: Payer: Self-pay | Admitting: Internal Medicine

## 2014-02-28 ENCOUNTER — Ambulatory Visit (AMBULATORY_SURGERY_CENTER): Payer: BC Managed Care – PPO | Admitting: Internal Medicine

## 2014-02-28 VITALS — BP 152/90 | HR 83 | Temp 97.4°F | Resp 15 | Ht 65.0 in | Wt 149.0 lb

## 2014-02-28 DIAGNOSIS — D120 Benign neoplasm of cecum: Secondary | ICD-10-CM

## 2014-02-28 DIAGNOSIS — D123 Benign neoplasm of transverse colon: Secondary | ICD-10-CM

## 2014-02-28 DIAGNOSIS — Z1211 Encounter for screening for malignant neoplasm of colon: Secondary | ICD-10-CM

## 2014-02-28 LAB — HM COLONOSCOPY

## 2014-02-28 MED ORDER — SODIUM CHLORIDE 0.9 % IV SOLN: 500.0000 mL | INTRAVENOUS | Status: DC

## 2014-02-28 NOTE — Op Note (Signed)
[place]  [address] [employer] [address]. [place], [id number]   COLONOSCOPY PROCEDURE REPORT  PATIENT: Brooke Wood, Brooke Wood  MR#: [id number] BIRTHDATE: 1965-09-19 , 23  yrs. old GENDER: female ENDOSCOPIST: Gatha Mayer, MD, [facility] PROCEDURE DATE:  02/28/2014 PROCEDURE:   Colonoscopy with snare polypectomy First Screening Colonoscopy - Avg.  risk and is 50 yrs.  old or older Yes.  Prior Negative Screening - Now for repeat screening. N/A  History of Adenoma - Now for follow-up colonoscopy & has been > or = to 3 yrs.  N/A  Polyps Removed Today? Yes. ASA CLASS:   Class II INDICATIONS:first colonoscopy and average risk for colorectal cancer. MEDICATIONS: Propofol 200 mg IV and Monitored anesthesia care  DESCRIPTION OF PROCEDURE:   After the risks benefits and alternatives of the procedure were thoroughly explained, informed consent was obtained.  The digital rectal exam revealed no abnormalities of the rectum.   The LB [id number]  endoscope was introduced through the anus and advanced to the cecum, which was identified by both the appendix and ileocecal valve. No adverse events experienced.   The quality of the prep was excellent, using MiraLax  The instrument was then slowly withdrawn as the colon was fully examined.      COLON FINDINGS: Two sessile polyps ranging from 4 to 76mm in size were found in the transverse colon and ascending colon. Polypectomies were performed with a cold snare.  The resection was complete, the polyp tissue was completely retrieved and sent to histology.   The examination was otherwise normal.  Retroflexed rectal and right colon views revealed no abnormalities. The time to cecum=2 minutes 24 seconds.  Withdrawal time=9 minutes 31 seconds. The scope was withdrawn and the procedure completed. COMPLICATIONS: There were no immediate complications.  ENDOSCOPIC IMPRESSION: 1.   Two sessile polyps ranging from 4 to 68mm in size were found in the transverse  colon and ascending colon; polypectomies were performed with a cold snare 2.   The examination was otherwise normal - excellent prep - first colonoscopy  RECOMMENDATIONS:   eSigned:  Gatha Mayer, MD, [facility] 02/28/2014 12:37 PM   cc: [name], MD and The Patient

## 2014-02-28 NOTE — Progress Notes (Signed)
Called to room to assist during endoscopic procedure.  Patient ID and intended procedure confirmed with present staff. Received instructions for my participation in the procedure from the performing physician.  

## 2014-02-28 NOTE — Progress Notes (Signed)
Stable to RR 

## 2014-02-28 NOTE — Patient Instructions (Addendum)
I found and removed 2 small polyps that look benign. Everything else was ok.  I will let you know pathology results and when to have another routine colonoscopy by mail.  I appreciate the opportunity to care for you. Gatha Mayer, MD, FACG  YOU HAD AN ENDOSCOPIC PROCEDURE TODAY AT [facility]: Refer to the procedure report that was given to you for any specific questions about what was found during the examination.  If the procedure report does not answer your questions, please call your gastroenterologist to clarify.  If you requested that your care partner not be given the details of your procedure findings, then the procedure report has been included in a sealed envelope for you to review at your convenience later.  YOU SHOULD EXPECT: Some feelings of bloating in the abdomen. Passage of more gas than usual.  Walking can help get rid of the air that was put into your GI tract during the procedure and reduce the bloating. If you had a lower endoscopy (such as a colonoscopy or flexible sigmoidoscopy) you may notice spotting of blood in your stool or on the toilet paper. If you underwent a bowel prep for your procedure, then you may not have a normal bowel movement for a few days.  DIET: Your first meal following the procedure should be a light meal and then it is ok to progress to your normal diet.  A half-sandwich or bowl of soup is an example of a good first meal.  Heavy or fried foods are harder to digest and may make you feel nauseous or bloated.  Likewise meals heavy in dairy and vegetables can cause extra gas to form and this can also increase the bloating.  Drink plenty of fluids but you should avoid alcoholic beverages for 24 hours.  ACTIVITY: Your care partner should take you home directly after the procedure.  You should plan to take it easy, moving slowly for the rest of the day.  You can resume normal activity the day after the procedure however you should NOT  DRIVE or use heavy machinery for 24 hours (because of the sedation medicines used during the test).    SYMPTOMS TO REPORT IMMEDIATELY: A gastroenterologist can be reached at any hour.  During normal business hours, 8:30 AM to 5:00 PM Monday through Friday, call [phone].  After hours and on weekends, please call the GI answering service at [phone] who will take a message and have the physician on call contact you.   Following lower endoscopy (colonoscopy or flexible sigmoidoscopy):  Excessive amounts of blood in the stool  Significant tenderness or worsening of abdominal pains  Swelling of the abdomen that is new, acute  Fever of 100F or higher  FOLLOW UP: If any biopsies were taken you will be contacted by phone or by letter within the next 1-3 weeks.  Call your gastroenterologist if you have not heard about the biopsies in 3 weeks.  Our staff will call the home number listed on your records the next business day following your procedure to check on you and address any questions or concerns that you may have at that time regarding the information given to you following your procedure. This is a courtesy call and so if there is no answer at the home number and we have not heard from you through the emergency physician on call, we will assume that you have returned to your regular daily activities without incident.  SIGNATURES/CONFIDENTIALITY: You  and/or your care partner have signed paperwork which will be entered into your electronic medical record.  These signatures attest to the fact that that the information above on your After Visit Summary has been reviewed and is understood.  Full responsibility of the confidentiality of this discharge information lies with you and/or your care-partner.  Please, read the handouts given to you by your recovery room nurse.

## 2014-03-01 ENCOUNTER — Telehealth: Payer: Self-pay

## 2014-03-01 ENCOUNTER — Encounter: Payer: BC Managed Care – PPO | Admitting: Physical Therapy

## 2014-03-01 HISTORY — PX: COLONOSCOPY: SHX174

## 2014-03-01 NOTE — Telephone Encounter (Signed)
  Follow up Call-  Call back number 02/28/2014  Post procedure Call Back phone  # [phone]  Permission to leave phone message Yes     Patient questions:  Do you have a fever, pain , or abdominal swelling? No. Pain Score  0 *  Have you tolerated food without any problems? Yes.    Have you been able to return to your normal activities? Yes.    Do you have any questions about your discharge instructions: Diet   No. Medications  No. Follow up visit  No.  Do you have questions or concerns about your Care? Yes.    Actions: * If pain score is 4 or above: No action needed, pain <4.

## 2014-03-03 ENCOUNTER — Encounter: Payer: Self-pay | Admitting: Internal Medicine

## 2014-03-03 DIAGNOSIS — Z86010 Personal history of colonic polyps: Secondary | ICD-10-CM | POA: Insufficient documentation

## 2014-03-03 DIAGNOSIS — Z860101 Personal history of adenomatous and serrated colon polyps: Secondary | ICD-10-CM | POA: Insufficient documentation

## 2014-03-03 HISTORY — DX: Personal history of colonic polyps: Z86.010

## 2014-03-03 HISTORY — DX: Personal history of adenomatous and serrated colon polyps: Z86.0101

## 2014-03-03 LAB — HM MAMMOGRAPHY

## 2014-03-03 NOTE — Progress Notes (Signed)
Quick Note:  2 adenomas max 6 mm Repeat colon 2020 ______

## 2014-03-07 ENCOUNTER — Ambulatory Visit: Payer: BC Managed Care – PPO | Admitting: Internal Medicine

## 2014-03-09 ENCOUNTER — Ambulatory Visit: Payer: BC Managed Care – PPO | Admitting: Physical Therapy

## 2014-03-14 ENCOUNTER — Other Ambulatory Visit: Payer: Self-pay | Admitting: Oncology

## 2014-03-14 DIAGNOSIS — Z853 Personal history of malignant neoplasm of breast: Secondary | ICD-10-CM

## 2014-03-23 ENCOUNTER — Telehealth: Payer: Self-pay

## 2014-03-23 ENCOUNTER — Ambulatory Visit: Payer: BC Managed Care – PPO | Admitting: Physical Therapy

## 2014-04-14 ENCOUNTER — Ambulatory Visit
Admission: RE | Admit: 2014-04-14 | Discharge: 2014-04-14 | Disposition: A | Discharge status: Home / Self Care | Payer: BC Managed Care – PPO | Source: Ambulatory Visit | Attending: Radiation Oncology | Admitting: Radiation Oncology

## 2014-04-14 ENCOUNTER — Encounter: Payer: Self-pay | Admitting: Radiation Oncology

## 2014-04-14 VITALS — BP 113/89 | HR 90 | Temp 98.5°F | Resp 12 | Wt 144.2 lb

## 2014-04-14 DIAGNOSIS — C50411 Malignant neoplasm of upper-outer quadrant of right female breast: Secondary | ICD-10-CM

## 2014-04-14 NOTE — Progress Notes (Signed)
  Radiation Oncology         [phone] ________________________________  Name: Brooke Wood MRN: [id number]  Date: 04/14/2014  DOB: [birth year]  Follow-Up Visit Note  CC: [name], MD  Deatra Robinson, MD    ICD-9-CM ICD-10-CM   1. Cancer of upper-outer quadrant of female breast, right 174.4 C50.411     Diagnosis:   Clinical stage II right breast cancer  Interval Since Last Radiation:  One year and 7-1/2 months   Narrative:  The patient returns today for routine follow-up.  She denies any pain in the right breast nipple discharge or bleeding. She continues to take tamoxifen on a daily basis. She denies any problems with swelling in her right arm or hand.                              ALLERGIES:  is allergic to aspirin; codeine; and codeine phosphate.  Meds: Current Outpatient Prescriptions  Medication Sig Dispense Refill  . cetirizine (ZYRTEC) 10 MG tablet Take 10 mg by mouth daily.    . potassium chloride SA (K-DUR,KLOR-CON) 20 MEQ tablet TAKE 1 TABLET BY MOUTH THREE TIMES A DAY/ Pt has been taking 1 tablet every day not three times a day as stated    . tamoxifen (NOLVADEX) 20 MG tablet Take 20 mg by mouth daily.    . valsartan-hydrochlorothiazide (DIOVAN-HCT) 160-25 MG per tablet TAKE 1 TABLET BY MOUTH ONCE DAILY 90 tablet 3   No current facility-administered medications for this encounter.    Physical Findings: The patient is in no acute distress. Patient is alert and oriented.  weight is 144 lb 3.2 oz (65.409 kg). Her oral temperature is 98.5 F (36.9 C). Her blood pressure is 113/89 and her pulse is 90. Her respiration is 12 and oxygen saturation is 100%. .  No palpable supraclavicular or axillary adenopathy. The lungs are clear to auscultation. The heart has a regular rhythm and rate. Examination of the left breast reveals no mass or nipple discharge.  the right breast reveals mild hyperpigmentation changes. There is mild induration throughout the breast. No dominant  masses appreciated breast nipple discharge or bleeding.  Lab Findings: Lab Results  Component Value Date   WBC 6.3 01/03/2014   HGB 13.9 01/03/2014   HCT 42.6 01/03/2014   MCV 90.0 01/03/2014   PLT 243.0 01/03/2014    Radiographic Findings: No results found.  Impression:  No evidence of recurrence on clinical exam today  Plan:  Routine follow-up in 6 months. Later this month patient will undergo mammogram and follow-up with medical oncology in mid February.  ____________________________________ Blair Promise, MD

## 2014-04-14 NOTE — Progress Notes (Signed)
She is currently in no pain. Pt complains of Fatigue.  Pt right breast for hyperpigmentation. No edema noted or reported. BP 113/89 mmHg  Pulse 90  Temp(Src) 98.5 F (36.9 C) (Oral)  Resp 12  Wt 144 lb 3.2 oz (65.409 kg)  SpO2 100%

## 2014-04-15 ENCOUNTER — Telehealth: Payer: Self-pay | Admitting: *Deleted

## 2014-04-15 NOTE — Telephone Encounter (Signed)
Pt called to let us know she is doing well with her diet and wanted to know some other food choices she can incorporate with what she is already doing. It sounds like she is really adhering to the diet that Christeen Douglas talked about with her during her last visit. Pt told me she has pain occasionally to her legs, but not everyday. She said this has been going  on for about a week. I asked her is there anything that makes it feel better, she said " not really". I asked her if she has taken Tylenol or applied heat or cold to it. Pt said, "no".  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 I advised her to make an appt with her PCP if pain doesn't subside. Pt agreed to the plan. Message to be forwarded to [facility].

## 2014-04-25 ENCOUNTER — Ambulatory Visit: Payer: 59 | Admitting: Internal Medicine

## 2014-04-25 ENCOUNTER — Telehealth: Payer: Self-pay | Admitting: Internal Medicine

## 2014-04-25 NOTE — Telephone Encounter (Signed)
Patient was a no show. Please advise.  °

## 2014-04-26 NOTE — Telephone Encounter (Signed)
noted 

## 2014-04-28 ENCOUNTER — Telehealth: Payer: Self-pay | Admitting: Internal Medicine

## 2014-04-28 ENCOUNTER — Ambulatory Visit (INDEPENDENT_AMBULATORY_CARE_PROVIDER_SITE_OTHER): Payer: 59 | Admitting: Internal Medicine

## 2014-04-28 ENCOUNTER — Encounter: Payer: Self-pay | Admitting: Internal Medicine

## 2014-04-28 VITALS — BP 114/80 | HR 89 | Temp 98.2°F | Resp 16 | Ht 65.0 in | Wt 145.0 lb

## 2014-04-28 DIAGNOSIS — M79676 Pain in unspecified toe(s): Secondary | ICD-10-CM

## 2014-04-28 DIAGNOSIS — B9789 Other viral agents as the cause of diseases classified elsewhere: Principal | ICD-10-CM

## 2014-04-28 DIAGNOSIS — J069 Acute upper respiratory infection, unspecified: Secondary | ICD-10-CM

## 2014-04-28 DIAGNOSIS — B351 Tinea unguium: Secondary | ICD-10-CM

## 2014-04-28 LAB — HM PAP SMEAR

## 2014-04-28 MED ORDER — PROMETHAZINE-DM 6.25-15 MG/5ML PO SYRP: 5.0000 mL | ORAL_SOLUTION | Freq: Four times a day (QID) | ORAL | Status: DC | PRN

## 2014-04-28 NOTE — Progress Notes (Signed)
Pre visit review using our clinic review tool, if applicable. No additional management support is needed unless otherwise documented below in the visit note. 

## 2014-04-28 NOTE — Telephone Encounter (Signed)
Pt called in and said that the [place] needs Another referral put in in order for pt to be seen   [place]

## 2014-04-28 NOTE — Patient Instructions (Signed)
Upper Respiratory Infection, Adult An upper respiratory infection (URI) is also known as the common cold. It is often caused by a type of germ (virus). Colds are easily spread (contagious). You can pass it to others by kissing, coughing, sneezing, or drinking out of the same glass. Usually, you get better in 1 or 2 weeks.  HOME CARE   Only take medicine as told by your doctor.  Use a warm mist humidifier or breathe in steam from a hot shower.  Drink enough water and fluids to keep your pee (urine) clear or pale yellow.  Get plenty of rest.  Return to work when your temperature is back to normal or as told by your doctor. You may use a face mask and wash your hands to stop your cold from spreading. GET HELP RIGHT AWAY IF:   After the first few days, you feel you are getting worse.  You have questions about your medicine.  You have chills, shortness of breath, or brown or red spit (mucus).  You have yellow or brown snot (nasal discharge) or pain in the face, especially when you bend forward.  You have a fever, puffy (swollen) neck, pain when you swallow, or white spots in the back of your throat.  You have a bad headache, ear pain, sinus pain, or chest pain.  You have a high-pitched whistling sound when you breathe in and out (wheezing).  You have a lasting cough or cough up blood.  You have sore muscles or a stiff neck. MAKE SURE YOU:   Understand these instructions.  Will watch your condition.  Will get help right away if you are not doing well or get worse. Document Released: 09/04/2007 Document Revised: 06/10/2011 Document Reviewed: 06/23/2013 ExitCare Patient Information 2015 [employer]. This information is not intended to replace advice given to you by your health care provider. Make sure you discuss any questions you have with your health care provider.  

## 2014-04-29 ENCOUNTER — Other Ambulatory Visit: Payer: Self-pay | Admitting: Oncology

## 2014-04-29 ENCOUNTER — Ambulatory Visit
Admission: RE | Admit: 2014-04-29 | Discharge: 2014-04-29 | Disposition: A | Discharge status: Home / Self Care | Payer: 59 | Source: Ambulatory Visit | Attending: Oncology | Admitting: Oncology

## 2014-04-29 DIAGNOSIS — Z853 Personal history of malignant neoplasm of breast: Secondary | ICD-10-CM

## 2014-04-29 NOTE — Progress Notes (Signed)
   Subjective:    Patient ID: Brooke Wood, female    DOB: 10/16/1965, 49 y.o.   MRN: [id number]  HPI Comments: She is concerned about both of her great toenails, they have been discolored and painful for manu months.  Cough This is a new problem. The current episode started in the past 7 days. The problem has been gradually improving. The problem occurs every few hours. The cough is non-productive. Associated symptoms include rhinorrhea and a sore throat. Pertinent negatives include no chest pain, chills, ear congestion, ear pain, fever, headaches, heartburn, hemoptysis, myalgias, nasal congestion, postnasal drip, rash, shortness of breath, sweats, weight loss or wheezing. She has tried OTC cough suppressant for the symptoms. The treatment provided no relief. There is no history of asthma, bronchiectasis, bronchitis, COPD, emphysema, environmental allergies or pneumonia.      Review of Systems  Constitutional: Negative.  Negative for fever, chills and weight loss.  HENT: Positive for rhinorrhea and sore throat. Negative for ear pain, postnasal drip, trouble swallowing and voice change.   Respiratory: Positive for cough. Negative for hemoptysis, choking, chest tightness, shortness of breath, wheezing and stridor.   Cardiovascular: Negative.  Negative for chest pain, palpitations and leg swelling.  Gastrointestinal: Negative.  Negative for heartburn and abdominal pain.  Endocrine: Negative.   Genitourinary: Negative.   Musculoskeletal: Negative for myalgias.  Skin: Negative for rash.  Allergic/Immunologic: Negative.  Negative for environmental allergies.  Neurological: Negative.  Negative for headaches.  Hematological: Negative.  Negative for adenopathy. Does not bruise/bleed easily.  Psychiatric/Behavioral: Negative.   All other systems reviewed and are negative.      Objective:   Physical Exam  Constitutional: She is oriented to person, place, and time. She appears well-developed and  well-nourished.  Non-toxic appearance. She does not have a sickly appearance. She does not appear ill. No distress.  HENT:  Head: Normocephalic and atraumatic.  Mouth/Throat: Oropharynx is clear and moist. No oropharyngeal exudate.  Eyes: Conjunctivae are normal. Right eye exhibits no discharge. Left eye exhibits no discharge. No scleral icterus.  Neck: Normal range of motion. Neck supple. No JVD present. No tracheal deviation present. No thyromegaly present.  Cardiovascular: Normal rate, regular rhythm, normal heart sounds and intact distal pulses.  Exam reveals no gallop and no friction rub.   No murmur heard. Pulmonary/Chest: Effort normal and breath sounds normal. No stridor. No tachypnea. No respiratory distress. She has no wheezes. She has no rales. She exhibits no tenderness.  Abdominal: Soft. Bowel sounds are normal. She exhibits no distension and no mass. There is no tenderness. There is no rebound and no guarding.  Musculoskeletal: Normal range of motion. She exhibits no edema or tenderness.  Lymphadenopathy:    She has no cervical adenopathy.  Neurological: She is oriented to person, place, and time.  Skin: Skin is warm and dry. No rash noted. She is not diaphoretic. No erythema. No pallor.  Both great toenails show nail thickening and subungual debris, there is no erythema/swelling/tenderness  Vitals reviewed.         Assessment & Plan:

## 2014-04-29 NOTE — Assessment & Plan Note (Signed)
She has a viral URI, antibiotics are not indicated Will treat the cough with a suppressant If this does not resolves in 2 weeks then will reconsider antibiotics

## 2014-04-29 NOTE — Assessment & Plan Note (Signed)
Will refer to podiatry to consider treatment options

## 2014-05-03 ENCOUNTER — Telehealth: Payer: Self-pay | Admitting: Oncology

## 2014-05-03 NOTE — Telephone Encounter (Signed)
[place] regarding her mammogram results and was not able to leave a message due to her voice mail not being set up.  Will continue to try to reach her.

## 2014-05-04 ENCOUNTER — Telehealth: Payer: Self-pay | Admitting: Oncology

## 2014-05-04 ENCOUNTER — Other Ambulatory Visit: Payer: Self-pay | Admitting: Internal Medicine

## 2014-05-04 DIAGNOSIS — M79676 Pain in unspecified toe(s): Principal | ICD-10-CM

## 2014-05-04 DIAGNOSIS — B351 Tinea unguium: Secondary | ICD-10-CM

## 2014-05-04 NOTE — Telephone Encounter (Signed)
Called Brooke Wood and let her know the normal results on her mammogram and ultrasound per Dr. Sondra Come.  Brooke Wood verbalized agreement.

## 2014-05-05 ENCOUNTER — Other Ambulatory Visit (HOSPITAL_BASED_OUTPATIENT_CLINIC_OR_DEPARTMENT_OTHER): Payer: 59

## 2014-05-05 DIAGNOSIS — C50411 Malignant neoplasm of upper-outer quadrant of right female breast: Secondary | ICD-10-CM

## 2014-05-05 DIAGNOSIS — D0511 Intraductal carcinoma in situ of right breast: Secondary | ICD-10-CM

## 2014-05-05 LAB — CBC WITH DIFFERENTIAL/PLATELET
BASO%: 0.9 % (ref 0.0–2.0)
BASOS ABS: 0.1 10*3/uL (ref 0.0–0.1)
EOS%: 0.6 % (ref 0.0–7.0)
Eosinophils Absolute: 0 10*3/uL (ref 0.0–0.5)
HEMATOCRIT: 41.6 % (ref 34.8–46.6)
HGB: 13.2 g/dL (ref 11.6–15.9)
LYMPH#: 2.3 10*3/uL (ref 0.9–3.3)
LYMPH%: 36.4 % (ref 14.0–49.7)
MCH: 28.4 pg (ref 25.1–34.0)
MCHC: 31.8 g/dL (ref 31.5–36.0)
MCV: 89.5 fL (ref 79.5–101.0)
MONO#: 0.7 10*3/uL (ref 0.1–0.9)
MONO%: 11.8 % (ref 0.0–14.0)
NEUT%: 50.3 % (ref 38.4–76.8)
NEUTROS ABS: 3.2 10*3/uL (ref 1.5–6.5)
Platelets: 220 10*3/uL (ref 145–400)
RBC: 4.65 10*6/uL (ref 3.70–5.45)
RDW: 13.5 % (ref 11.2–14.5)
WBC: 6.3 10*3/uL (ref 3.9–10.3)

## 2014-05-05 LAB — COMPREHENSIVE METABOLIC PANEL (CC13)
ALBUMIN: 4.1 g/dL (ref 3.5–5.0)
ALK PHOS: 83 U/L (ref 40–150)
ALT: 14 U/L (ref 0–55)
ANION GAP: 11 meq/L (ref 3–11)
AST: 10 U/L (ref 5–34)
BILIRUBIN TOTAL: 0.34 mg/dL (ref 0.20–1.20)
BUN: 10.1 mg/dL (ref 7.0–26.0)
CO2: 27 mEq/L (ref 22–29)
CREATININE: 0.7 mg/dL (ref 0.6–1.1)
Calcium: 9.8 mg/dL (ref 8.4–10.4)
Chloride: 101 mEq/L (ref 98–109)
EGFR: >90 mL/min/{1.73_m2} (ref >90)
Glucose: 90 mg/dl (ref 70–140)
Potassium: 3.9 mEq/L (ref 3.5–5.1)
Sodium: 139 mEq/L (ref 136–145)
TOTAL PROTEIN: 7.1 g/dL (ref 6.4–8.3)

## 2014-05-06 ENCOUNTER — Ambulatory Visit (INDEPENDENT_AMBULATORY_CARE_PROVIDER_SITE_OTHER): Payer: 59 | Admitting: Podiatry

## 2014-05-06 ENCOUNTER — Ambulatory Visit (INDEPENDENT_AMBULATORY_CARE_PROVIDER_SITE_OTHER): Payer: 59

## 2014-05-06 ENCOUNTER — Encounter: Payer: Self-pay | Admitting: Podiatry

## 2014-05-06 VITALS — BP 129/85 | HR 74 | Resp 12 | Ht 65.0 in | Wt 145.0 lb

## 2014-05-06 DIAGNOSIS — M79673 Pain in unspecified foot: Secondary | ICD-10-CM

## 2014-05-06 DIAGNOSIS — M722 Plantar fascial fibromatosis: Secondary | ICD-10-CM | POA: Diagnosis not present

## 2014-05-06 DIAGNOSIS — B351 Tinea unguium: Secondary | ICD-10-CM

## 2014-05-06 DIAGNOSIS — L601 Onycholysis: Secondary | ICD-10-CM

## 2014-05-06 MED ORDER — EFINACONAZOLE 10 % EX SOLN: 1.0000 [drp] | Freq: Every day | CUTANEOUS | Status: DC

## 2014-05-06 NOTE — Patient Instructions (Signed)
Plantar Fasciitis (Heel Spur Syndrome) with Rehab The plantar fascia is a fibrous, ligament-like, soft-tissue structure that spans the bottom of the foot. Plantar fasciitis is a condition that causes pain in the foot due to inflammation of the tissue. SYMPTOMS   Pain and tenderness on the underneath side of the foot.  Pain that worsens with standing or walking. CAUSES  Plantar fasciitis is caused by irritation and injury to the plantar fascia on the underneath side of the foot. Common mechanisms of injury include:  Direct trauma to bottom of the foot.  Damage to a small nerve that runs under the foot where the main fascia attaches to the heel bone.  Stress placed on the plantar fascia due to bone spurs. RISK INCREASES WITH:   Activities that place stress on the plantar fascia (running, jumping, pivoting, or cutting).  Poor strength and flexibility.  Improperly fitted shoes.  Tight calf muscles.  Flat feet.  Failure to warm-up properly before activity.  Obesity. PREVENTION  Warm up and stretch properly before activity.  Allow for adequate recovery between workouts.  Maintain physical fitness:  Strength, flexibility, and endurance.  Cardiovascular fitness.  Maintain a health body weight.  Avoid stress on the plantar fascia.  Wear properly fitted shoes, including arch supports for individuals who have flat feet. PROGNOSIS  If treated properly, then the symptoms of plantar fasciitis usually resolve without surgery. However, occasionally surgery is necessary. RELATED COMPLICATIONS   Recurrent symptoms that may result in a chronic condition.  Problems of the lower back that are caused by compensating for the injury, such as limping.  Pain or weakness of the foot during push-off following surgery.  Chronic inflammation, scarring, and partial or complete fascia tear, occurring more often from repeated injections. TREATMENT  Treatment initially involves the use of  ice and medication to help reduce pain and inflammation. The use of strengthening and stretching exercises may help reduce pain with activity, especially stretches of the Achilles tendon. These exercises may be performed at home or with a therapist. Your caregiver may recommend that you use heel cups of arch supports to help reduce stress on the plantar fascia. Occasionally, corticosteroid injections are given to reduce inflammation. If symptoms persist for greater than 6 months despite non-surgical (conservative), then surgery may be recommended.  MEDICATION   If pain medication is necessary, then nonsteroidal anti-inflammatory medications, such as aspirin and ibuprofen, or other minor pain relievers, such as acetaminophen, are often recommended.  Do not take pain medication within 7 days before surgery.  Prescription pain relievers may be given if deemed necessary by your caregiver. Use only as directed and only as much as you need.  Corticosteroid injections may be given by your caregiver. These injections should be reserved for the most serious cases, because they may only be given a certain number of times. HEAT AND COLD  Cold treatment (icing) relieves pain and reduces inflammation. Cold treatment should be applied for 10 to 15 minutes every 2 to 3 hours for inflammation and pain and immediately after any activity that aggravates your symptoms. Use ice packs or massage the area with a piece of ice (ice massage).  Heat treatment may be used prior to performing the stretching and strengthening activities prescribed by your caregiver, physical therapist, or athletic trainer. Use a heat pack or soak the injury in warm water. SEEK IMMEDIATE MEDICAL CARE IF:  Treatment seems to offer no benefit, or the condition worsens.  Any medications produce adverse side effects. EXERCISES RANGE   OF MOTION (ROM) AND STRETCHING EXERCISES - Plantar Fasciitis (Heel Spur Syndrome) These exercises may help you  when beginning to rehabilitate your injury. Your symptoms may resolve with or without further involvement from your physician, physical therapist or athletic trainer. While completing these exercises, remember:   Restoring tissue flexibility helps normal motion to return to the joints. This allows healthier, less painful movement and activity.  An effective stretch should be held for at least 30 seconds.  A stretch should never be painful. You should only feel a gentle lengthening or release in the stretched tissue. RANGE OF MOTION - Toe Extension, Flexion  Sit with your right / left leg crossed over your opposite knee.  Grasp your toes and gently pull them back toward the top of your foot. You should feel a stretch on the bottom of your toes and/or foot.  Hold this stretch for __________ seconds.  Now, gently pull your toes toward the bottom of your foot. You should feel a stretch on the top of your toes and or foot.  Hold this stretch for __________ seconds. Repeat __________ times. Complete this stretch __________ times per day.  RANGE OF MOTION - Ankle Dorsiflexion, Active Assisted  Remove shoes and sit on a chair that is preferably not on a carpeted surface.  Place right / left foot under knee. Extend your opposite leg for support.  Keeping your heel down, slide your right / left foot back toward the chair until you feel a stretch at your ankle or calf. If you do not feel a stretch, slide your bottom forward to the edge of the chair, while still keeping your heel down.  Hold this stretch for __________ seconds. Repeat __________ times. Complete this stretch __________ times per day.  STRETCH - Gastroc, Standing  Place hands on wall.  Extend right / left leg, keeping the front knee somewhat bent.  Slightly point your toes inward on your back foot.  Keeping your right / left heel on the floor and your knee straight, shift your weight toward the wall, not allowing your back to  arch.  You should feel a gentle stretch in the right / left calf. Hold this position for __________ seconds. Repeat __________ times. Complete this stretch __________ times per day. STRETCH - Soleus, Standing  Place hands on wall.  Extend right / left leg, keeping the other knee somewhat bent.  Slightly point your toes inward on your back foot.  Keep your right / left heel on the floor, bend your back knee, and slightly shift your weight over the back leg so that you feel a gentle stretch deep in your back calf.  Hold this position for __________ seconds. Repeat __________ times. Complete this stretch __________ times per day. STRETCH - Gastrocsoleus, Standing  Note: This exercise can place a lot of stress on your foot and ankle. Please complete this exercise only if specifically instructed by your caregiver.   Place the ball of your right / left foot on a step, keeping your other foot firmly on the same step.  Hold on to the wall or a rail for balance.  Slowly lift your other foot, allowing your body weight to press your heel down over the edge of the step.  You should feel a stretch in your right / left calf.  Hold this position for __________ seconds.  Repeat this exercise with a slight bend in your right / left knee. Repeat __________ times. Complete this stretch __________ times per day.    STRENGTHENING EXERCISES - Plantar Fasciitis (Heel Spur Syndrome)  These exercises may help you when beginning to rehabilitate your injury. They may resolve your symptoms with or without further involvement from your physician, physical therapist or athletic trainer. While completing these exercises, remember:   Muscles can gain both the endurance and the strength needed for everyday activities through controlled exercises.  Complete these exercises as instructed by your physician, physical therapist or athletic trainer. Progress the resistance and repetitions only as guided. STRENGTH -  Towel Curls  Sit in a chair positioned on a non-carpeted surface.  Place your foot on a towel, keeping your heel on the floor.  Pull the towel toward your heel by only curling your toes. Keep your heel on the floor.  If instructed by your physician, physical therapist or athletic trainer, add ____________________ at the end of the towel. Repeat __________ times. Complete this exercise __________ times per day. STRENGTH - Ankle Inversion  Secure one end of a rubber exercise band/tubing to a fixed object (table, pole). Loop the other end around your foot just before your toes.  Place your fists between your knees. This will focus your strengthening at your ankle.  Slowly, pull your big toe up and in, making sure the band/tubing is positioned to resist the entire motion.  Hold this position for __________ seconds.  Have your muscles resist the band/tubing as it slowly pulls your foot back to the starting position. Repeat __________ times. Complete this exercises __________ times per day.  Document Released: 03/18/2005 Document Revised: 06/10/2011 Document Reviewed: 06/30/2008 Exit[employer] Patient Information 2015 [employer]. This information is not intended to replace advice given to you by your health care provider. Make sure you discuss any questions you have with your health care provider.  

## 2014-05-06 NOTE — Progress Notes (Signed)
   Subjective:    Patient ID: Brooke Wood, female    DOB: 10/21/1965, 49 y.o.   MRN: [id number]  HPI 49 year old female presents the office today with complaints of bilateral big toenail pain. She states the nails have started to lift and separate from the underlying nail bed. She states the nails are also discolored and thickened. She has tenderness to these nails upon pressure in certain shoes. She denies any recent redness or swelling or drainage around the nail sites.  Also at today's appointment she states that she has tenderness to both of her heels which is intermittent in nature and on a daily basis. She states that she has pain in the mornings which is relieved by ambulation or after standing for long period of time. She states of the pain is at the distal ache on the bottom of her heel. She denies any swelling or redness around the area. She denies any history of trauma or any change or increase in activity that time onto the symptoms. The pain does not wake her up at night. No other complaints at this time.   Review of Systems  Respiratory: Positive for cough.   Musculoskeletal: Positive for arthralgias.  All other systems reviewed and are negative.      Objective:   Physical Exam AAO 3, NAD DP/PT pulses palpable, CRT less than 3 seconds  Protective sensation intact with Simms Weinstein monofilament, vibratory sensation intact, Achilles tendon reflex intact. Bilateral hallux nails are hypertrophic, dystrophic, discolored distally and separate and underlying nailbed distally however firmly adhered proximally. There is tenderness to palpation overlying both of the nails. There is no swelling erythema or drainage from the nail sites. Remaining nails without pathology. There is tenderness palpation overlying the plantar medial tubercle bilateral calcaneus at the insertion of the plantar fascia. There is no pain on the course of plantar fascial in the arch of the foot. No pain with  lateral compression of the calcaneus or pain with vibratory sensation. No pain along the posterior aspect of the calcaneus or along the course/insertion of the Achilles tendon. There is a decrease in medial arch height upon weightbearing. Mild equinus present. MMT 5/5, ROM WNL No open lesions or pre-ulcerative lesions are identified. No pain with calf compression, swelling, warmth, erythema.        Assessment & Plan:   49 year old female with bilateral hallux onycholysis, likely onychomycosis; heel pain/plantar fasciitis -X-rays were obtained and reviewed with the patient. -Treatment options were discussed including alternatives, risks, complications. -Bilateral hallux nails are sharply debrided without complication/bleeding to patient comfort. Discussed. Treatment options for likely onychomycosis. This time she is elected to proceed with treatment. She elected to proceed with Jublia. Prescription for this was sent to her pharmacy. Side effects the medication were discussed the patient and directed to call the office and medially should any occur. Discussed that if the nail remains symptomatically may need to have a total nail avulsion however after debridement she had resolution of symptoms. -For bilateral heel pain discussed likely etiology. At today's appointment offer a steroid injection into the heels however she declined. Recommended ice to the area as well as stretching exercises which we discussed. Plantar fascial brace was dispensed. Discussed shoe gear modifications. -Follow-up in 3 weeks or sooner should any problems arise. In the meantime encouraged to call the office with any questions, concerns, change in symptoms.

## 2014-05-12 ENCOUNTER — Telehealth: Payer: Self-pay | Admitting: Oncology

## 2014-05-12 ENCOUNTER — Encounter: Payer: Self-pay | Admitting: Nurse Practitioner

## 2014-05-12 ENCOUNTER — Ambulatory Visit (HOSPITAL_BASED_OUTPATIENT_CLINIC_OR_DEPARTMENT_OTHER): Payer: 59 | Admitting: Nurse Practitioner

## 2014-05-12 VITALS — BP 131/81 | HR 81 | Temp 97.8°F | Resp 18 | Ht 65.0 in | Wt 146.2 lb

## 2014-05-12 DIAGNOSIS — C50411 Malignant neoplasm of upper-outer quadrant of right female breast: Secondary | ICD-10-CM

## 2014-05-12 NOTE — Telephone Encounter (Signed)
gave pt avs report and appt schedule for aug 2016

## 2014-05-12 NOTE — Progress Notes (Signed)
ID: Brooke Wood OB: [birth year]  MR#: [id number]  ZRA#:[id number]  PCP: [name], MD GYN:   SU:  OTHER MD:  CHIEF COMPLAINT: Right invasive tubular lobular breast cancer  CURRENT THERAPY: Tamoxifen 18m PO daily  BREAST CANCER HISTORY:  TNastasiapalpated a lump in the right breast in early November 2013. [facility] these were performed on 03/[facility]/2012. The mass measured 2.8cm mammographically. The ultrasound showed a spiculated mass in the 11 o'clock location of the right breast, 8 cm from the nipple measuring 2.9 cm. The right axilla showed no adenopathy.   An ultrasound guided core biopsy of the right breast on 03/04/2012 revealed invasive ductal carcinoma, grade 2, ER positive, PR positive, HER-2/neu negative, Ki-67 20%. An MRI performed on 03/10/2012 showed the mass to be 2.7cm and irregular. No abnormalities were found in the left breast, or either axilla. Her case was discussed at the multidisciplinary breast clinic on 03/11/2012 and referred for genetic counseling and testing.   A right breast lumpectomy with sentinel lymph node biopsy was performed on 04/09/2012. 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 One sentinel node had isolated tumor cells. All margins clean.   The patient had an Oncotype DX performed. Her score was 10, giving her a 7% risk of distance recurrence with tamoxifen for 5 year. She was referred to radiation oncology. Her subsequent history is as follows.   INTERVAL HISTORY: TAdleyreturns today for follow up of her breast cancer. She has been on tamoxifen since May 2014 and is tolerating it well. She has infrequent hot flashes, but denies joint aches or vaginal changes. Her last period was 1/28 and was normal. At our last visit we spoke extensively about the importance of diet and I wrote her a  list of foods to avoid. TCatalynahas taken this to heart and has lost 6-7lb since her last visit with diet alone! She has not started exercising yet, but would like to.   REVIEW OF SYSTEMS: TJaneenedenies fevers, chills, nausea, vomiting, or changes in bowel or bladder habits. She has no shortness of breath, chest pain, cough, or palpitations. She denies headaches, dizziness, or vision changes. She has some calf pain at night, but she thinks this is because she stands all day at work and the muscle just gets tight. She is well hydrated. A detailed review of systems is otherwise negative.  PAST MEDICAL HISTORY: Past Medical History  Diagnosis Date  . Allergy     Neg RAST 2007  . Hypertension   . Vocal cord dysfunction     Neg Methacholine challenge test 2007  . Breast cancer     right breast  . Hx of radiation therapy 06/15/12 - 07/29/12    right rbeast, 50.4 Gy x 28 fx, boost to cumulative dose 60.4 gray  . Hx of adenomatous colonic polyps 03/03/2014    PAST SURGICAL HISTORY: Past Surgical History  Procedure Laterality Date  . Breast surgery  1985    rt br bx  . Breast lumpectomy with sentinel lymph node biopsy  04/09/2012    Procedure: BREAST LUMPECTOMY WITH SENTINEL LYMPH NODE BX;  Surgeon: PMerrie Roof MD;  [facility]  Service: General;  Laterality: Right;    FAMILY HISTORY Family History  Problem Relation Age of Onset  . Hypertension Other   .  Alcohol abuse Neg Hx   . Diabetes Neg Hx   . Early death Neg Hx   . Hearing loss Neg Hx   . Heart disease Neg Hx   . Hyperlipidemia Neg Hx   . Stroke Neg Hx   . Kidney disease Neg Hx   . Colon cancer Neg Hx   . Breast cancer Neg Hx   . Lung cancer Father 72    smoker  . Cancer Father     lung  Her mother is still alive in her 78s and is in good health. Her father passed away when she was a teenager from lung cancer. Her 3 brothers are younger, and have no significant health history that she can recall.    GYNECOLOGIC HISTORY:  Menarche occurred at age 83. The patient is GX P1, giving birth to her son about age 74-25. She is still menstruating. She was on birth control pills for 10+ years.   SOCIAL HISTORY: Brooke Wood lives at home alone. 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 Her son, Brooke Wood is 25. He graduated with his masters degree in sports education and Mudlogger from [employer] in May 2015. Her mother, Brooke Wood, lives 5 minutes away and is supportive. 2 of her brothers reside in [place], the other in [place]. She was married to her son's father for 9-10 years, but they got divorced in her 49s. She does not currently have a significant other. She is a Psychologist, forensic but "still needs to find a good church home."     ADVANCED DIRECTIVES: Not in place at this time. She intends to name her son, Brooke Wood, as her healthcare power of attorney. "[name]" can be reached at [phone].    HEALTH MAINTENANCE: History  Substance Use Topics  . Smoking status: Never Smoker   . Smokeless tobacco: Never Used  . Alcohol Use: No    Colonoscopy: "needs to schedule when insurance obtained" Bone Density Scan: never Pap Smear: UTD/2013 Lipid Panel:    Allergies  Allergen Reactions  . Aspirin Hives  . Codeine Other (See Comments)    "confusion"  . Codeine Phosphate     REACTION: unspecified    Current Outpatient Prescriptions  Medication Sig Dispense Refill  . cetirizine (ZYRTEC) 10 MG tablet Take 10 mg by mouth daily.    . Efinaconazole 10 % SOLN Apply 1 drop topically daily. 8 mL 2  . potassium chloride SA (K-DUR,KLOR-CON) 20 MEQ tablet TAKE 1 TABLET BY MOUTH THREE TIMES A DAY/ Pt has been taking 1 tablet every day not three times a day as stated    . promethazine-dextromethorphan (PROMETHAZINE-DM) 6.25-15 MG/5ML syrup Take 5 mLs by mouth 4 (four)  times daily as needed for cough. 118 mL 0  . tamoxifen (NOLVADEX) 20 MG tablet Take 20 mg by mouth daily.    . valsartan-hydrochlorothiazide (DIOVAN-HCT) 160-25 MG per tablet TAKE 1 TABLET BY MOUTH ONCE DAILY 90 tablet 3   No current facility-administered medications for this visit.    OBJECTIVE: Filed Vitals:   05/12/14 1048  BP: 131/81  Pulse: 81  Temp: 97.8 F (36.6 C)  Resp: 18     Body mass index is 24.33 kg/(m^2).   ROS   Physical Exam`  Skin: warm, dry  HEENT: sclerae anicteric, conjunctivae pink, oropharynx clear. No thrush or mucositis.  Lymph Nodes: No  cervical or supraclavicular lymphadenopathy  Lungs: clear to auscultation bilaterally, no rales, wheezes, or rhonci  Heart: regular rate and rhythm  Abdomen: round, soft, non tender, positive bowel sounds  Musculoskeletal: No focal spinal tenderness, no peripheral edema  Neuro: non focal, well oriented, positive affect  Breasts: right breast status post lumpectomy and radiation. Hyperpigmentation noted to right breast. No evidence of local recurrence. Right axilla benign. Left breast unremarkable.   ECOG FS:0 - Asymptomatic  LAB RESULTS: Lab Results  Component Value Date   WBC 6.3 05/05/2014   HGB 13.2 05/05/2014   HCT 41.6 05/05/2014   MCV 89.5 05/05/2014   PLT 220 05/05/2014     Chemistry      Component Value Date/Time   NA 139 05/05/2014 0948   NA 133* 01/03/2014 0923   NA 133* 01/03/2014 0923   K 3.9 05/05/2014 0948   K 3.9 01/03/2014 0923   K 3.9 01/03/2014 0923   CL 101 01/03/2014 0923   CL 101 01/03/2014 0923   CL 98 07/24/2012 1248   CO2 27 05/05/2014 0948   CO2 23 01/03/2014 0923   CO2 23 01/03/2014 0923   BUN 10.1 05/05/2014 0948   BUN 11 01/03/2014 0923   BUN 11 01/03/2014 0923   CREATININE 0.7 05/05/2014 0948   CREATININE 0.6 01/03/2014 0923   CREATININE 0.6 01/03/2014 0923      Component Value Date/Time   CALCIUM 9.8 05/05/2014 0948   CALCIUM 9.1 01/03/2014 0923   CALCIUM 9.1  01/03/2014 0923   ALKPHOS 83 05/05/2014 0948   ALKPHOS 68 01/03/2014 0923   AST 10 05/05/2014 0948   AST 18 01/03/2014 0923   ALT 14 05/05/2014 0948   ALT 18 01/03/2014 0923   BILITOT 0.34 05/05/2014 0948   BILITOT 0.4 01/03/2014 0923      Urinalysis No results found for: COLORURINE  STUDIES: Most recent mammogram on 04/29/14 was unremarkable.   ASSESSMENT:  49 y.o. [place] woman   (1) status post right breast biopsy on 03/11/2012 for a clinical invasive ductal carcinoma with tubular features, grade 2, ER positive, PR positive, HER-2/neu negative, Ki-67 20%  (2) status post definitive right lumpectomy and sentinel lymph node biopsy on 04/09/2012. This revealed a 2.8 cm invasive tubular lobular carcinoma,T2 N0i, stage IIA, grade 2, ER 99%, PR 4%, HER-2/neu negative, Ki-67 60%. One sentinel node had isolated tumor cells. All margins clean.   (3) Oncotype DX performed. Her score was 10, giving her a 7% risk of distance recurrence with tamoxifen for 5 year.   (4) She completed radiation therapy in April 2015  (5) She began tamoxifen in May 2014 with a plan to continue therapy for 10 years  PLAN:  Elivia looks and feels great today. The labs were reviewed in detail and were entirely normal. She will continue with tamoxifen, as she is tolerating it well, with the goal of 10 years of antiestrogen therapy.   We spent another 20 minutes discussing her diet and plans for exercise. [facility] I think she will [facility] these and take full advantage of this opportunity.   Ashiyah will return in 6 months for labs and a follow up visit. She understands and agrees with this plan. She knows the goal of treatment in her case is cure. She has been encouraged to call with any issues that might arise before her next visit here.  Marcelino Duster, NP  05/12/2014 1:24 PM

## 2014-05-12 NOTE — Addendum Note (Signed)
Addended by: Marcelino Duster on: 05/12/2014 05:00 PM   Modules accepted: Orders

## 2014-05-16 ENCOUNTER — Ambulatory Visit: Payer: 59 | Admitting: Podiatry

## 2014-05-20 NOTE — Telephone Encounter (Signed)
none

## 2014-05-27 ENCOUNTER — Ambulatory Visit: Payer: 59 | Admitting: Podiatry

## 2014-09-05 ENCOUNTER — Other Ambulatory Visit: Payer: Self-pay | Admitting: Nurse Practitioner

## 2014-09-06 ENCOUNTER — Other Ambulatory Visit: Payer: Self-pay | Admitting: *Deleted

## 2014-09-06 MED ORDER — TAMOXIFEN CITRATE 20 MG PO TABS: 20.0000 mg | ORAL_TABLET | Freq: Every day | ORAL | Status: DC

## 2014-10-11 ENCOUNTER — Ambulatory Visit
Admission: RE | Admit: 2014-10-11 | Discharge: 2014-10-11 | Disposition: A | Discharge status: Home / Self Care | Payer: 59 | Source: Ambulatory Visit | Attending: Radiation Oncology | Admitting: Radiation Oncology

## 2014-10-11 ENCOUNTER — Encounter: Payer: Self-pay | Admitting: Radiation Oncology

## 2014-10-11 VITALS — BP 123/73 | HR 84 | Temp 99.0°F | Resp 12 | Ht 65.0 in | Wt 143.1 lb

## 2014-10-11 DIAGNOSIS — C50411 Malignant neoplasm of upper-outer quadrant of right female breast: Secondary | ICD-10-CM

## 2014-10-11 NOTE — Progress Notes (Signed)
Cathie Beams here for follow up.  She reports soreness in her right forearm that started a few days ago. She reports she is working out at [employer] and may have hurt it there.  She reports fatigue due to the heat.  She is taking tamoxifen.  She reports that she had been denied for the [employer] bus.  She will have a mammogram in January.  She has hyperpigmentation on her right breast.    BP 123/73 mmHg  Pulse 84  Temp(Src) 99 F (37.2 C) (Oral)  Resp 12  Ht 5\' 5"  (1.651 m)  Wt 143 lb 1.6 oz (64.91 kg)  BMI 23.81 kg/m2

## 2014-10-11 NOTE — Progress Notes (Signed)
  Radiation Oncology         [phone] ________________________________  Name: Brooke Wood MRN: [id number]  Date: 10/11/2014  DOB: 12/10/1965  Follow-Up Visit Note  CC: [name], MD  Consuela Mimes, MD    ICD-9-CM ICD-10-CM   1. Cancer of upper-outer quadrant of female breast, right 174.4 C50.411     Diagnosis:   Diagnosis: Clinical stage II right breast cancer  Interval Since Last Radiation:  2 years and 2 months   Narrative:  The patient returns today for routine follow-up.  She has some soreness in her right forearm which she attributes to exercise. She denies any swelling in her right arm or fever. She continues on tamoxifen. She reports hot flashes with this medication. She denies any pain within the right breast area nipple discharge or bleeding. She denies any left breast pain nipple discharge or bleeding.                              ALLERGIES:  is allergic to aspirin; codeine; and codeine phosphate.  Meds: Current Outpatient Prescriptions  Medication Sig Dispense Refill  . cetirizine (ZYRTEC) 10 MG tablet Take 10 mg by mouth daily.    . potassium chloride SA (K-DUR,KLOR-CON) 20 MEQ tablet TAKE 1 TABLET BY MOUTH THREE TIMES A DAY/ Pt has been taking 1 tablet every day not three times a day as stated    . tamoxifen (NOLVADEX) 20 MG tablet Take 1 tablet (20 mg total) by mouth daily. 30 tablet 2  . valsartan-hydrochlorothiazide (DIOVAN-HCT) 160-25 MG per tablet TAKE 1 TABLET BY MOUTH ONCE DAILY 90 tablet 3  . Efinaconazole 10 % SOLN Apply 1 drop topically daily. (Patient not taking: Reported on 10/11/2014) 8 mL 2  . promethazine-dextromethorphan (PROMETHAZINE-DM) 6.25-15 MG/5ML syrup Take 5 mLs by mouth 4 (four) times daily as needed for cough. (Patient not taking: Reported on 10/11/2014) 118 mL 0   No current facility-administered medications for this encounter.    Physical Findings: The patient is in no acute distress. Patient is alert and oriented.  height is 5'  5" (1.651 m) and weight is 143 lb 1.6 oz (64.91 kg). Her oral temperature is 99 F (37.2 C). Her blood pressure is 123/73 and her pulse is 84. Her respiration is 12. .  No palpable supraclavicular or axillary adenopathy. The lungs are clear to auscultation. The heart has a regular rhythm and rate. Examination of the left breast reveals no mass or nipple discharge. Examination right breast reveals hyperpigmentation changes. No dominant mass appreciated breast nipple discharge or bleeding.  Lab Findings: Lab Results  Component Value Date   WBC 6.3 05/05/2014   HGB 13.2 05/05/2014   HCT 41.6 05/05/2014   MCV 89.5 05/05/2014   PLT 220 05/05/2014    Radiographic Findings: No results found.  Impression:  No evidence of recurrence on clinical exam today  Plan:  Routine follow-up in 6 months. Patient will undergo repeat mammography in January 2017  ____________________________________ Gery Pray, MD

## 2014-10-12 ENCOUNTER — Ambulatory Visit: Payer: 59 | Admitting: Radiation Oncology

## 2014-10-13 ENCOUNTER — Ambulatory Visit: Payer: 59 | Admitting: Radiation Oncology

## 2014-10-27 ENCOUNTER — Other Ambulatory Visit: Payer: Self-pay | Admitting: Internal Medicine

## 2014-11-03 ENCOUNTER — Other Ambulatory Visit (HOSPITAL_BASED_OUTPATIENT_CLINIC_OR_DEPARTMENT_OTHER): Payer: 59

## 2014-11-03 DIAGNOSIS — C50411 Malignant neoplasm of upper-outer quadrant of right female breast: Secondary | ICD-10-CM

## 2014-11-03 DIAGNOSIS — Z853 Personal history of malignant neoplasm of breast: Secondary | ICD-10-CM | POA: Diagnosis not present

## 2014-11-03 LAB — COMPREHENSIVE METABOLIC PANEL (CC13)
ALK PHOS: 86 U/L (ref 40–150)
ALT: 14 U/L (ref 0–55)
AST: 11 U/L (ref 5–34)
Albumin: 3.9 g/dL (ref 3.5–5.0)
Anion Gap: 6 mEq/L (ref 3–11)
BILIRUBIN TOTAL: 0.34 mg/dL (ref 0.20–1.20)
BUN: 16.2 mg/dL (ref 7.0–26.0)
CALCIUM: 9.3 mg/dL (ref 8.4–10.4)
CO2: 30 mEq/L — ABNORMAL HIGH (ref 22–29)
Chloride: 103 mEq/L (ref 98–109)
Creatinine: 0.9 mg/dL (ref 0.6–1.1)
EGFR: 87 mL/min/{1.73_m2} — ABNORMAL LOW (ref >90)
Glucose: 88 mg/dl (ref 70–140)
Potassium: 3.9 mEq/L (ref 3.5–5.1)
Sodium: 139 mEq/L (ref 136–145)
Total Protein: 6.7 g/dL (ref 6.4–8.3)

## 2014-11-03 LAB — CBC WITH DIFFERENTIAL/PLATELET
BASO%: 0.5 % (ref 0.0–2.0)
BASOS ABS: 0 10*3/uL (ref 0.0–0.1)
EOS ABS: 0.1 10*3/uL (ref 0.0–0.5)
EOS%: 0.9 % (ref 0.0–7.0)
HEMATOCRIT: 40.8 % (ref 34.8–46.6)
HGB: 13.5 g/dL (ref 11.6–15.9)
LYMPH%: 31.6 % (ref 14.0–49.7)
MCH: 29.3 pg (ref 25.1–34.0)
MCHC: 33.1 g/dL (ref 31.5–36.0)
MCV: 88.6 fL (ref 79.5–101.0)
MONO#: 0.7 10*3/uL (ref 0.1–0.9)
MONO%: 10.7 % (ref 0.0–14.0)
NEUT#: 3.6 10*3/uL (ref 1.5–6.5)
NEUT%: 56.3 % (ref 38.4–76.8)
PLATELETS: 205 10*3/uL (ref 145–400)
RBC: 4.6 10*6/uL (ref 3.70–5.45)
RDW: 12.8 % (ref 11.2–14.5)
WBC: 6.4 10*3/uL (ref 3.9–10.3)
lymph#: 2 10*3/uL (ref 0.9–3.3)

## 2014-11-08 ENCOUNTER — Telehealth: Payer: Self-pay | Admitting: *Deleted

## 2014-11-08 NOTE — Telephone Encounter (Signed)
TC from patient this morning stating she has been experiencing some swelling in her hand-same side as her breast surgery. It does not extend up to her arm. She has a compression sleeve but it starts at wrist-does not have a hand component.  She has an appointment to see Gentry Fitz on Thursday @ 1:15 pm. Advised to keep appointment so swelling can be evaluated at that time. Denies pain. Also advised to keep hand elevated as much as possible to help decrease some swelling.  Pt voiced understanding.

## 2014-11-10 ENCOUNTER — Encounter: Payer: Self-pay | Admitting: Nurse Practitioner

## 2014-11-10 ENCOUNTER — Telehealth: Payer: Self-pay | Admitting: Nurse Practitioner

## 2014-11-10 ENCOUNTER — Ambulatory Visit (HOSPITAL_BASED_OUTPATIENT_CLINIC_OR_DEPARTMENT_OTHER): Payer: 59 | Admitting: Nurse Practitioner

## 2014-11-10 VITALS — BP 129/90 | HR 90 | Temp 97.4°F | Resp 18 | Ht 65.0 in | Wt 143.2 lb

## 2014-11-10 DIAGNOSIS — I890 Lymphedema, not elsewhere classified: Secondary | ICD-10-CM | POA: Insufficient documentation

## 2014-11-10 DIAGNOSIS — Z853 Personal history of malignant neoplasm of breast: Secondary | ICD-10-CM | POA: Diagnosis not present

## 2014-11-10 DIAGNOSIS — Z79810 Long term (current) use of selective estrogen receptor modulators (SERMs): Secondary | ICD-10-CM | POA: Diagnosis not present

## 2014-11-10 DIAGNOSIS — C50411 Malignant neoplasm of upper-outer quadrant of right female breast: Secondary | ICD-10-CM

## 2014-11-10 NOTE — Telephone Encounter (Signed)
Appointments made and avs printed for patient °

## 2014-11-10 NOTE — Addendum Note (Signed)
Addended by: Marcelino Duster on: 11/10/2014 02:30 PM   Modules accepted: Orders

## 2014-11-10 NOTE — Progress Notes (Addendum)
ID: [name] OB: [birth year]  MR#: [id number]  QBV#:[id number]  PCP: [name], MD GYN:   SU:  OTHER MD:  CHIEF COMPLAINT: Right invasive tubular lobular breast cancer  CURRENT THERAPY: Tamoxifen 30m PO daily  BREAST CANCER HISTORY:  TArshipalpated a lump in the right breast in early November 2013. [facility] and these were performed on 03/04/2012. The mass measured 2.8cm mammographically. The ultrasound showed a spiculated mass in the 11 o'clock location of the right breast, 8 cm from the nipple measuring 2.9 cm. The right axilla showed no adenopathy.   An ultrasound guided core biopsy of the right breast on 03/04/2012 revealed invasive ductal carcinoma, grade 2, ER positive, PR positive, HER-2/neu negative, Ki-67 20%. An MRI performed on 03/10/2012 showed the mass to be 2.7cm and irregular. No abnormalities were found in the left breast, or either axilla. Her case was discussed at the multidisciplinary breast clinic on 03/11/2012 and referred for genetic counseling and testing.   A right breast lumpectomy with sentinel lymph node biopsy was performed on 04/09/2012. 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 One sentinel node had isolated tumor cells. All margins clean.   The patient had an Oncotype DX performed. Her score was 10, giving her a 7% risk of distance recurrence with tamoxifen for 5 year. She was referred to radiation oncology. Her subsequent history is as follows.   INTERVAL HISTORY: TValoriereturns today for follow up of her breast cancer. She has been on tamoxifen since May 2014 and is tolerating it well. She has infrequent hot flashes, but denies joint aches or vaginal changes. She has not had a period in 2 months and believes she is headed in to menopause. She continues on her diet and exercise  journey, recently joining the [employer] She enjoys this time, and is down another 3lb since her last visit with [place]  REVIEW OF SYSTEMS: TNaiomydenies fevers, chills, nausea, vomiting, or changes in bowel or bladder habits. She has no shortness of breath, chest pain, cough, or palpitations. She denies headaches, dizziness, or vision changes. She has new swelling and pain to her right hand for 1 week. She has a history of right arm lymphedema and wears her sleeve to bed faithfully.  A detailed review of systems is otherwise negative.  PAST MEDICAL HISTORY: Past Medical History  Diagnosis Date  . Allergy     Neg RAST 2007  . Hypertension   . Vocal cord dysfunction     Neg Methacholine challenge test 2007  . Breast cancer     right breast  . Hx of radiation therapy 06/15/12 - 07/29/12    right rbeast, 50.4 Gy x 28 fx, boost to cumulative dose 60.4 gray  . Hx of adenomatous colonic polyps 03/03/2014    PAST SURGICAL HISTORY: Past Surgical History  Procedure Laterality Date  . Breast surgery  1985    rt br bx  . Breast lumpectomy with sentinel lymph node biopsy  04/09/2012    Procedure: BREAST LUMPECTOMY WITH SENTINEL LYMPH NODE BX;  Surgeon: PMerrie Roof MD;  [facility]  Service: General;  Laterality: Right;    FAMILY HISTORY Family History  Problem Relation Age of Onset  . Hypertension Other   . Alcohol abuse Neg Hx   . Diabetes Neg Hx   .  Early death Neg Hx   . Hearing loss Neg Hx   . Heart disease Neg Hx   . Hyperlipidemia Neg Hx   . Stroke Neg Hx   . Kidney disease Neg Hx   . Colon cancer Neg Hx   . Breast cancer Neg Hx   . Lung cancer Father 80    smoker  . Cancer Father     lung  Her mother is still alive in her 51s and is in good health. Her father passed away when she was a teenager from lung cancer. Her 3 brothers are younger, and have no significant health history that she can recall.   GYNECOLOGIC HISTORY:  Menarche occurred  at age 15. The patient is GX P1, giving birth to her son about age 17-25. She is still menstruating. She was on birth control pills for 10+ years.   SOCIAL HISTORY: Margarete lives at home alone. 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 Her son, Leylani Duley is 41. He graduated with his masters degree in sports education and Mudlogger from [employer] in May 2015. Her mother, [name], lives 5 minutes away and is supportive. 2 of her brothers reside in [place], the other in [place]. She was married to her son's father for 9-10 years, but they got divorced in her 51s. She does not currently have a significant other. She is a Psychologist, forensic but "still needs to find a good church home."     ADVANCED DIRECTIVES: Not in place at this time. She intends to name her son, Danashia Landers, as her healthcare power of attorney. "[name]" can be reached at [phone].    HEALTH MAINTENANCE: Social History  Substance Use Topics  . Smoking status: Never Smoker   . Smokeless tobacco: Never Used  . Alcohol Use: No    Colonoscopy: "needs to schedule when insurance obtained" Bone Density Scan: never Pap Smear: UTD/2013 Lipid Panel:    Allergies  Allergen Reactions  . Aspirin Hives  . Codeine Other (See Comments)    "confusion"  . Codeine Phosphate     REACTION: unspecified    Current Outpatient Prescriptions  Medication Sig Dispense Refill  . potassium chloride SA (K-DUR,KLOR-CON) 20 MEQ tablet TAKE 1 TABLET BY MOUTH THREE TIMES A DAY (Patient taking differently: TAKE 1 TABLET BY MOUTH DAILY) 90 tablet 5  . tamoxifen (NOLVADEX) 20 MG tablet Take 1 tablet (20 mg total) by mouth daily. 30 tablet 2  . valsartan-hydrochlorothiazide (DIOVAN-HCT) 160-25 MG per tablet TAKE 1 TABLET BY MOUTH ONCE DAILY 90 tablet 3  . cetirizine (ZYRTEC) 10 MG tablet Take 10 mg by mouth daily.      No current facility-administered medications for this visit.    OBJECTIVE: Filed Vitals:   11/09/49 1311  BP: 129/90  Pulse: 90  Temp: 97.4 F (36.3 C)  Resp: 18     Body mass index is 23.83 kg/(m^2).   ROS   Physical Exam`  Skin: warm, dry  HEENT: sclerae anicteric, conjunctivae pink, oropharynx clear. No thrush or mucositis.  Lymph Nodes: No cervical or supraclavicular lymphadenopathy  Lungs: clear to auscultation bilaterally, no rales, wheezes, or rhonci  Heart: regular rate and rhythm  Abdomen: round, soft, non tender, positive bowel sounds  Musculoskeletal: No focal spinal tenderness, +1 swelling tenderness to right hand  Neuro: non focal, well oriented, positive affect  Breasts: right breast status post lumpectomy and radiation. Hyperpigmentation noted to right breast. No evidence of local recurrence. Right axilla benign. Left breast unremarkable.   ECOG FS:0 - Asymptomatic  LAB RESULTS: Lab Results  Component Value Date   WBC 6.4 11/03/2014   HGB 13.5 11/03/2014   HCT 40.8 11/03/2014   MCV 88.6 11/03/2014   PLT 205 11/03/2014     Chemistry      Component Value Date/Time   NA 139 11/03/2014 1002   NA 133* 01/03/2014 0923   NA 133* 01/03/2014 0923   K 3.9 11/03/2014 1002   K 3.9 01/03/2014 0923   K 3.9 01/03/2014 0923   CL 101 01/03/2014 0923   CL 101 01/03/2014 0923   CL 98 07/24/2012 1248   CO2 30* 11/03/2014 1002   CO2 23 01/03/2014 0923   CO2 23 01/03/2014 0923   BUN 16.2 11/03/2014 1002   BUN 11 01/03/2014 0923   BUN 11 01/03/2014 0923   CREATININE 0.9 11/03/2014 1002   CREATININE 0.6 01/03/2014 0923   CREATININE 0.6 01/03/2014 0923      Component Value Date/Time   CALCIUM 9.3 11/03/2014 1002   CALCIUM 9.1 01/03/2014 0923   CALCIUM 9.1 01/03/2014 0923   ALKPHOS 86 11/03/2014 1002   ALKPHOS 68 01/03/2014 0923   AST 11 11/03/2014 1002   AST 18 01/03/2014 0923   ALT 14 11/03/2014 1002   ALT 18 01/03/2014 0923   BILITOT 0.34 11/03/2014  1002   BILITOT 0.4 01/03/2014 0923      Urinalysis No results found for: COLORURINE  STUDIES: Most recent mammogram on 04/29/14 was unremarkable.   ASSESSMENT:  49 y.o. [place] woman   (1) status post right breast biopsy on 03/11/2012 for a clinical invasive ductal carcinoma with tubular features, grade 2, ER positive, PR positive, HER-2/neu negative, Ki-67 20%  (2) status post definitive right lumpectomy and sentinel lymph node biopsy on 04/09/2012. This revealed a 2.8 cm invasive tubular lobular carcinoma,T2 N0i, stage IIA, grade 2, ER 99%, PR 4%, HER-2/neu negative, Ki-67 60%. One sentinel node had isolated tumor cells. All margins clean.   (3) Oncotype DX performed. Her score was 10, giving her a 7% risk of distance recurrence with tamoxifen for 5 year.   (4) She completed radiation therapy in April 2015  (5) She began tamoxifen in May 2014 with a plan to continue therapy for 10 years  PLAN:  Martyna is doing well as far as her breast cancer is concerned. She is now 2.5 years out from her definitive surgery with no evidence of recurrent disease. The labs were reviewed in detail and were entirely stable. She will continue with tamoxifen, as she is tolerating it well, with the goal of 10 years of antiestrogen therapy.   She is excited about her continued weight loss journey. We reviewed better food choices at the grocery store again, but for the most part she is doing well in this department. She would like to get back to the gym, but her hand swelling bothers her. I am going to rule out a DVT and send her to physical therapy to see if she might be fit for a glove. She does not remember injuring her hand in any way, and I do not believe it is fractured. She has good range of motion.   Chandell will return in 6 months for labs and a follow up visit. She understands and agrees with this plan. She knows the goal of treatment in her  case is cure. She has been encouraged to call with any  issues that might arise before her next visit here.  Laurie Panda, NP  11/10/2014 2:14 PM

## 2014-11-11 ENCOUNTER — Telehealth: Payer: Self-pay | Admitting: Nurse Practitioner

## 2014-11-11 NOTE — Telephone Encounter (Signed)
Voicemail is full. Unable to leave voicemail to confirm [place] on 08/15 @ 10.

## 2014-11-14 ENCOUNTER — Ambulatory Visit (HOSPITAL_COMMUNITY)
Admission: RE | Admit: 2014-11-14 | Discharge: 2014-11-14 | Disposition: A | Discharge status: Home / Self Care | Payer: 59 | Source: Ambulatory Visit | Attending: Nurse Practitioner | Admitting: Nurse Practitioner

## 2014-11-14 ENCOUNTER — Other Ambulatory Visit: Payer: Self-pay | Admitting: Internal Medicine

## 2014-11-14 ENCOUNTER — Other Ambulatory Visit: Payer: Self-pay | Admitting: Nurse Practitioner

## 2014-11-14 ENCOUNTER — Telehealth: Payer: Self-pay | Admitting: *Deleted

## 2014-11-14 DIAGNOSIS — C50411 Malignant neoplasm of upper-outer quadrant of right female breast: Secondary | ICD-10-CM | POA: Diagnosis not present

## 2014-11-14 DIAGNOSIS — M7989 Other specified soft tissue disorders: Secondary | ICD-10-CM | POA: Insufficient documentation

## 2014-11-14 DIAGNOSIS — M79601 Pain in right arm: Secondary | ICD-10-CM | POA: Insufficient documentation

## 2014-11-14 DIAGNOSIS — M25531 Pain in right wrist: Secondary | ICD-10-CM | POA: Diagnosis not present

## 2014-11-14 NOTE — Progress Notes (Signed)
VASCULAR LAB PRELIMINARY  PRELIMINARY  PRELIMINARY  PRELIMINARY  Right upper extremity venous duplex completed.    Preliminary report:  Right :  No evidence of DVT or superficial thrombosis.    Lealon Vanputten, RVS 11/14/2014, 10:46 AM

## 2014-11-14 NOTE — Telephone Encounter (Signed)
THE RESULTS OF THE RIGHT UPPER EXTREMITY VENOUS WAS NEGATIVE. NOTIFIED HEATHER BOELTER,NP.

## 2014-11-16 ENCOUNTER — Ambulatory Visit: Payer: 59 | Attending: Nurse Practitioner | Admitting: Physical Therapy

## 2014-11-16 ENCOUNTER — Ambulatory Visit: Payer: 59 | Admitting: Physical Therapy

## 2014-11-16 DIAGNOSIS — M7989 Other specified soft tissue disorders: Secondary | ICD-10-CM | POA: Diagnosis not present

## 2014-11-16 DIAGNOSIS — M79631 Pain in right forearm: Secondary | ICD-10-CM | POA: Diagnosis present

## 2014-11-16 NOTE — Therapy (Addendum)
[place], [place], [address] Phone: [phone]   Fax:  [phone]  Physical Therapy Evaluation  Patient Details  Name: Brooke Wood MRN: [id number] Date of Birth: 1965-06-15 Referring Provider:  Laurie Panda, NP  Encounter Date: 11/16/2014      PT End of Session - 11/16/14 1412    Visit Number 1   Number of Visits 7   Date for PT Re-Evaluation 12/09/14   PT Start Time 2505   PT Stop Time 1357   PT Time Calculation (min) 52 min   Activity Tolerance Patient tolerated treatment well   Behavior During Therapy [facility] for tasks assessed/performed      Past Medical History  Diagnosis Date  . Allergy     Neg RAST 2007  . Hypertension   . Vocal cord dysfunction     Neg Methacholine challenge test 2007  . Breast cancer     right breast  . Hx of radiation therapy 06/15/12 - 07/29/12    right rbeast, 50.4 Gy x 28 fx, boost to cumulative dose 60.4 gray  . Hx of adenomatous colonic polyps 03/03/2014    Past Surgical History  Procedure Laterality Date  . Breast surgery  1985    rt br bx  . Breast lumpectomy with sentinel lymph node biopsy  04/09/2012    Procedure: BREAST LUMPECTOMY WITH SENTINEL LYMPH NODE BX;  Surgeon: Merrie Roof, MD;  Location: [facility];  Service: General;  Laterality: Right;    There were no vitals filed for this visit.  Visit Diagnosis:  Swelling of right hand - Plan: PT plan of care cert/re-cert  Pain and swelling of right forearm - Plan: PT plan of care cert/re-cert      Subjective Assessment - 11/16/14 1311    Subjective right hand swelled up about a week ago; also has a knot at right wrist   Pertinent History Right breast cancer diagnosed in 2014 with lumpectomy and SLNB; radiation completed 07/29/2013.  Pt. has had some swelling in the past and is known to this clinic.  HTN controlled with meds.   Had Doppler of knot on right wrist recently, and  this was negative.                                                                                  Patient Stated Goals get the hand reduced   Currently in Pain? Yes   Pain Score 5    Pain Location Hand   Pain Orientation Right   Pain Descriptors / Indicators Sore   Pain Onset 1 to 4 weeks ago   Pain Frequency Intermittent   Aggravating Factors  sweeping, making coffee   Pain Relieving Factors rest   Effect of Pain on Daily Activities difficult to lift water to make coffee at work and also to [employer]            [facility] PT Assessment - 11/16/14 0001    Assessment   Medical Diagnosis right breast cancer   Precautions   Precautions Other (comment)   Precaution Comments cancer precautions   Restrictions   Weight Bearing Restrictions No  Balance Screen   Has the patient fallen in the past 6 months No   Has the patient had a decrease in activity level because of a fear of falling?  No   Is the patient reluctant to leave their home because of a fear of falling?  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   Cognition   Overall Cognitive Status Within Functional Limits for tasks assessed   Observation/Other Assessments   Observations Well looking woman, looks younger than her age   Quick DASH  54.55   AROM   Right Shoulder Flexion 141 Degrees   Right Shoulder ABduction 174 Degrees   Right Shoulder Internal Rotation 50 Degrees   Right Shoulder External Rotation 75 Degrees            LYMPHEDEMA/ONCOLOGY QUESTIONNAIRE - 11/16/14 1323    Type   Cancer Type right breast cancer   Surgeries   Lumpectomy Date 04/09/12   Number Lymph Nodes Removed 2   Right Upper Extremity Lymphedema   15 cm Proximal to Olecranon Process 27.3 cm   10 cm Proximal to Olecranon Process 25.7 cm   Olecranon Process 22.3 cm   15 cm Proximal to Ulnar Styloid Process 22.4 cm   10 cm Proximal to Ulnar Styloid Process 18.9 cm   Just Proximal to Ulnar Styloid Process 14.8 cm   Across Hand at [employer] 17.5 cm   At [place] of 2nd Digit 5.7 cm   Other     Left Upper Extremity Lymphedema   15 cm Proximal to Olecranon Process 27.2 cm   10 cm Proximal to Olecranon Process 25.5 cm   Olecranon Process 22 cm   15 cm Proximal to Ulnar Styloid Process 21.9 cm   10 cm Proximal to Ulnar Styloid Process 18.8 cm   Just Proximal to Ulnar Styloid Process 14.1 cm   Across Hand at [employer] 17.1 cm   At [place] of 2nd Digit 5.5 cm           Quick Dash - 11/16/14 0001    Open a tight or new jar Severe difficulty   Do heavy household chores (wash walls, wash floors) Moderate difficulty   Carry a shopping bag or briefcase Unable   Wash your back Unable   Use a knife to cut food Moderate difficulty   During the past week, to what extent has your arm, shoulder or hand problem interfered with your normal social activities with family, friends, neighbors, or groups? Modererately   During the past week, to what extent has your arm, shoulder or hand problem limited your work or other regular daily activities Modererately   Arm, shoulder, or hand pain. Moderate   Tingling (pins and needles) in your arm, shoulder, or hand Moderate   Difficulty Sleeping Moderate difficulty   DASH Score 54.55 %             [facility] Adult PT Treatment/Exercise - 11/16/14 0001    Manual Therapy  Manual Therapy Edema management;Manual Lymphatic Drainage (MLD);Compression Bandaging   Compression Bandaging Bandaging of  right hand and forearm with stockinette, Elastomull on all five fingers, Artiflex x 1, and 1-6 cm. and 1-8 cm. short stretch bandage from hand to elbow.                PT Education - 11/16/14 1411    Education provided Yes   Education Details Do AROM with fingers, wrist, and elbow of right hand, including in case of pain or paresthesias; if pain or paresthesias unrelieved by exercise, remove bandages   Person(s) Educated Patient   Methods Explanation   Comprehension Verbalized understanding           Short Term Clinic Goals - 02/22/14 1158    CC Short Term Goal  #1   Title pt will report at least a 25 percent decrease in pain in right arm.   Time 4   Period Weeks   Status On-going   CC Short Term Goal  #2   Title pt will have active right shoulder flexion of at least 140 degrees for more functional use of right arm   Time 4   Period Weeks   Status On-going   CC Short Term Goal  #3   Title pt will have active right shoulder abduction of at least 140 degrees for more functional use of right arm   Time 4   Period Weeks   Status On-going             Long Term Clinic Goals - 11/16/14 1419    CC Long Term Goal  #1   Title reduce swelling across hand at thumb webspace to 17.2 cm. or less   Baseline 17.5 cm. compared to 17 previously   Time 2   Period Weeks   CC Long Term Goal  #2   Title pt. will be knowledgeable about where and how to obtain a compression glove for right hand   Time 2   Period Weeks   Status New   CC Long Term Goal  #3   Title will report right hand pain decreased by at least 50%   Baseline 5/10   Time 2   Period Weeks   Status New            Plan - 11/16/14 1412    Clinical Impression Statement Patient known to this clinic with referral now due to new onset of right hand swelling; has h/o right breast cancer lumpectomy and radiation.  Has a "knot" at right wrist of unknown cause; clot was ruled out by Doppler.  Pt. should reduce  quickly with a short course of complete decongestive therapy, then may  need a compression glove.   Pt will benefit from skilled therapeutic intervention in order to improve on the following deficits Increased edema;Pain;Decreased knowledge of use of DME;Decreased knowledge of precautions   Rehab Potential Excellent   PT Frequency 3x / week   PT Duration 2 weeks   PT Treatment/Interventions Compression bandaging;Manual lymph drainage;Patient/family education;DME Instruction;ADLs/Self Care Home Management   PT Next Visit Plan Check benefit of bandaging and continue that; also do manual lymph drainage and assist patient with obtaining compression glove.   PT Home Exercise Plan AROM right hand, wrist, elbow.   Recommended Other Services compression glove to be obtained   Consulted and Agree with Plan of Care Patient         Problem List Patient Active Problem List   Diagnosis  Date Noted  . Lymphedema of upper extremity 11/10/2014  . Viral URI with cough 04/28/2014  . Pain due to onychomycosis of toenail 04/28/2014  . Hx of adenomatous colonic polyps 03/03/2014  . Hypokalemia 10/29/2013  . Routine general medical examination at a health care facility 06/03/2013  . DJD (degenerative joint disease) of knee 11/24/2012  . Hyperlipidemia with target LDL less than 130 11/24/2012  . Cancer of upper-outer quadrant of female breast 03/06/2012  . Goiter 07/26/2009  . VOCAL CORD DISORDER 07/24/2009  . Essential hypertension, benign 01/19/2007  . ALLERGIC RHINITIS 01/19/2007    [place],DONNA 11/16/2014, 10:29 PM  [place], [place], [address] Phone: [phone]   Fax:  [place], PT 11/16/2014 10:29 PM

## 2014-11-17 ENCOUNTER — Ambulatory Visit: Payer: 59 | Admitting: Physical Therapy

## 2014-11-17 DIAGNOSIS — M7989 Other specified soft tissue disorders: Secondary | ICD-10-CM | POA: Diagnosis not present

## 2014-11-17 DIAGNOSIS — M79631 Pain in right forearm: Secondary | ICD-10-CM

## 2014-11-17 NOTE — Patient Instructions (Signed)
Use TG soft stockinette day and night on right hand and forearm over the weekend. Call us Monday to let us know if the hand and forearm still feel good and have stayed small (swelling did or did not increase again). If no more swelling, we'll stop therapy and get you fitted for a compression glove to use as needed. If swelling comes back, we'll schedule you to come in to get bandaged again.

## 2014-11-17 NOTE — Therapy (Signed)
[place], [place], [address] Phone: [phone]   Fax:  [phone]  Physical Therapy Treatment  Patient Details  Name: GUISELLE MIAN MRN: [id number] Date of Birth: 07/04/1965 Referring Provider:  Laurie Panda, NP  Encounter Date: 11/17/2014      PT End of Session - 11/17/14 1711    Visit Number 2   Number of Visits 7   Date for PT Re-Evaluation 12/09/14   PT Start Time 1500   PT Stop Time 1555   PT Time Calculation (min) 55 min   Activity Tolerance Patient tolerated treatment well   Behavior During Therapy [facility] for tasks assessed/performed      Past Medical History  Diagnosis Date  . Allergy     Neg RAST 2007  . Hypertension   . Vocal cord dysfunction     Neg Methacholine challenge test 2007  . Breast cancer     right breast  . Hx of radiation therapy 06/15/12 - 07/29/12    right rbeast, 50.4 Gy x 28 fx, boost to cumulative dose 60.4 gray  . Hx of adenomatous colonic polyps 03/03/2014    Past Surgical History  Procedure Laterality Date  . Breast surgery  1985    rt br bx  . Breast lumpectomy with sentinel lymph node biopsy  04/09/2012    Procedure: BREAST LUMPECTOMY WITH SENTINEL LYMPH NODE BX;  Surgeon: Merrie Roof, MD;  Location: [place];  Service: General;  Laterality: Right;    There were no vitals filed for this visit.  Visit Diagnosis:  Swelling of right hand  Pain and swelling of right forearm      Subjective Assessment - 11/17/14 1502    Subjective "It was hard" but kept the bandages on.  Last night was rough, getting used to using just one hand.   Currently in Pain? No/denies            [facility] PT Assessment - 11/17/14 0001    Palpation   Palpation comment knot on right wrist has softened up compared to yesterday           LYMPHEDEMA/ONCOLOGY QUESTIONNAIRE - 11/17/14 1506    Right Upper Extremity Lymphedema   15 cm Proximal to  Olecranon Process 27.3 cm   10 cm Proximal to Olecranon Process 25.4 cm   Olecranon Process 22.4 cm   15 cm Proximal to Ulnar Styloid Process 22 cm   10 cm Proximal to Ulnar Styloid Process 18.9 cm   Just Proximal to Ulnar Styloid Process 14.3 cm   Across Hand at [employer] 17.4 cm   At [place] of 2nd Digit 5.4 cm                  [facility] Adult PT Treatment/Exercise - 11/17/14 0001    Manual Therapy   Manual Therapy Edema management;Manual Lymphatic Drainage (MLD)   Edema Management circumference measurements taken; cut and fitted a small TG soft stockinette for right hand and forearm and then cut a second one   Manual Lymphatic Drainage (MLD) Diaphragmatic breathing; short neck, left axilla and anterior interaxillary anastomosis, right groin axillo-inguinal anastomosis, and right UE from fingers to shoulder, in supine                PT Education - 11/17/14 1711    Education provided Yes   Education Details instructed patient re: using TG soft and monitoring swelling   Person(s) Educated Patient  Methods Explanation;Handout   Comprehension Verbalized understanding                [facility] Goals - 11/17/14 1715    CC Long Term Goal  #1   Title reduce swelling across hand at thumb webspace to 17.2 cm. or less   Status On-going   CC Long Term Goal  #3   Title will report right hand pain decreased by at least 50%   Status Achieved            Plan - 11/17/14 1712    Clinical Impression Statement Patient with signficant reduction in circumferences at right arm just with 24 hours of bandaging; also with softening of the knot in her right wrist and with reduction of her pain.  She is very pleased with this quick progress.   Pt will benefit from skilled therapeutic intervention in order to improve on the following deficits Increased edema;Pain;Decreased knowledge of use of DME;Decreased knowledge of precautions   Rehab Potential Excellent   PT  Frequency --  see plan below   PT Treatment/Interventions Manual lymph drainage;Patient/family education;DME Instruction;ADLs/Self Care Home Management   PT Next Visit Plan Patient will monitor swelling with use of TG soft.  If swelling does not increase by Monday, 8/22, we will discontinue and just have her get a compression glove; if swelling worsens, we will schedule her to come back for  bandaging again.   Consulted and Agree with Plan of Care Patient        Problem List Patient Active Problem List   Diagnosis Date Noted  . Lymphedema of upper extremity 11/10/2014  . Viral URI with cough 04/28/2014  . Pain due to onychomycosis of toenail 04/28/2014  . Hx of adenomatous colonic polyps 03/03/2014  . Hypokalemia 10/29/2013  . Routine general medical examination at a health care facility 06/03/2013  . DJD (degenerative joint disease) of knee 11/24/2012  . Hyperlipidemia with target LDL less than 130 11/24/2012  . Cancer of upper-outer quadrant of female breast 03/06/2012  . Goiter 07/26/2009  . VOCAL CORD DISORDER 07/24/2009  . Essential hypertension, benign 01/19/2007  . ALLERGIC RHINITIS 01/19/2007    SALISBURY,DONNA 11/17/2014, 5:16 PM  [place], [place], [address] Phone: [phone]   Fax:  [place], PT 11/17/2014 5:16 PM

## 2014-11-22 ENCOUNTER — Ambulatory Visit: Payer: 59 | Admitting: Physical Therapy

## 2014-11-22 DIAGNOSIS — M7989 Other specified soft tissue disorders: Secondary | ICD-10-CM | POA: Diagnosis not present

## 2014-11-22 DIAGNOSIS — M79631 Pain in right forearm: Secondary | ICD-10-CM

## 2014-11-22 NOTE — Therapy (Signed)
[place], [place], [address] Phone: [phone]   Fax:  [phone]  Physical Therapy Treatment  Patient Details  Name: Brooke Wood MRN: [id number] Date of Birth: 1965-07-23 Referring Provider:  Laurie Panda, NP  Encounter Date: 11/22/2014      PT End of Session - 11/22/14 1456    Visit Number 3   Number of Visits 7   Date for PT Re-Evaluation 12/09/14   PT Start Time 1435   PT Stop Time 1455   PT Time Calculation (min) 20 min   Behavior During Therapy [facility] for tasks assessed/performed      Past Medical History  Diagnosis Date  . Allergy     Neg RAST 2007  . Hypertension   . Vocal cord dysfunction     Neg Methacholine challenge test 2007  . Breast cancer     right breast  . Hx of radiation therapy 06/15/12 - 07/29/12    right rbeast, 50.4 Gy x 28 fx, boost to cumulative dose 60.4 gray  . Hx of adenomatous colonic polyps 03/03/2014    Past Surgical History  Procedure Laterality Date  . Breast surgery  1985    rt br bx  . Breast lumpectomy with sentinel lymph node biopsy  04/09/2012    Procedure: BREAST LUMPECTOMY WITH SENTINEL LYMPH NODE BX;  Surgeon: Merrie Roof, MD;  Location: [place];  Service: General;  Laterality: Right;    There were no vitals filed for this visit.  Visit Diagnosis:  Swelling of right hand  Pain and swelling of right forearm      Subjective Assessment - 11/22/14 1439    Subjective Its doing good.  She says she has not made it over there to get a  craft glove.    Pertinent History Right breast cancer diagnosed in 2014 with lumpectomy and SLNB; radiation completed 07/29/2013.  Pt. has had some swelling in the past and is known to this clinic.  HTN controlled with meds.   Had Doppler of knot on right wrist recently, and this was negative.                                                                                  Currently in Pain?  No/denies               LYMPHEDEMA/ONCOLOGY QUESTIONNAIRE - 11/22/14 1441    Right Upper Extremity Lymphedema   Olecranon Process 22.4 cm   15 cm Proximal to Ulnar Styloid Process 22 cm   10 cm Proximal to Ulnar Styloid Process 18.3 cm   Just Proximal to Ulnar Styloid Process 14.3 cm   Across Hand at [employer] 17.3 cm   At [place] of 2nd Digit 5.4 cm                  [facility] Adult PT Treatment/Exercise - 11/22/14 0001    Self-Care   Self-Care Other Self-Care Comments   Other Self-Care Comments  provided tg soft for right hand and forearm  [facility] Goals - 11/22/14 1449    CC Long Term Goal  #1   Title reduce swelling across hand at thumb webspace to 17.2 cm. or less   Baseline --  17.3   Status Partially Met   CC Long Term Goal  #2   Title pt. will be knowledgeable about where and how to obtain a compression glove for right hand   Status Achieved   CC Long Term Goal  #3   Title will report right hand pain decreased by at least 50%   Status Achieved            Plan - 11/22/14 1457    Clinical Impression Statement Pt has not had recurrance of edema without use of bandages except for small amount of visible swelling not easily measured at thenar webspace.  She plans to call the MD about the cause of the "bump " at her wrist. She is awaiting a call back from Rexford Maus about her glove.  Her goals have been met except the full reduction at hand has not been met    PT Next Visit Plan Discharge this visit    Consulted and Agree with Plan of Care Patient        Problem List Patient Active Problem List   Diagnosis Date Noted  . Lymphedema of upper extremity 11/10/2014  . Viral URI with cough 04/28/2014  . Pain due to onychomycosis of toenail 04/28/2014  . Hx of adenomatous colonic polyps 03/03/2014  . Hypokalemia 10/29/2013  . Routine general medical examination at a health care facility 06/03/2013   . DJD (degenerative joint disease) of knee 11/24/2012  . Hyperlipidemia with target LDL less than 130 11/24/2012  . Cancer of upper-outer quadrant of female breast 03/06/2012  . Goiter 07/26/2009  . VOCAL CORD DISORDER 07/24/2009  . Essential hypertension, benign 01/19/2007  . ALLERGIC RHINITIS 01/19/2007     PHYSICAL THERAPY DISCHARGE SUMMARY  Visits from Start of Care: 3  Current functional level related to goals / functional outcomes: Swelling has reduced, pt is awaiting a compression glove   Remaining deficits: Mild edema in right hand and at right wrist    Education / Equipment: Elevation and exercise, how to get compression glove, use of temporary tg soft compression Plan: Patient agrees to discharge.  Patient goals were partially met. Patient is being discharged due to meeting the stated rehab goals.  ?????       Donato Heinz. Owens Shark, PT  11/22/2014, 3:00 PM  [place], [place], [address] Phone: [phone]   Fax:  [phone]

## 2014-11-23 ENCOUNTER — Ambulatory Visit: Payer: 59

## 2014-12-12 ENCOUNTER — Ambulatory Visit: Payer: 59 | Admitting: Internal Medicine

## 2014-12-20 ENCOUNTER — Ambulatory Visit: Payer: 59 | Admitting: Internal Medicine

## 2015-02-27 ENCOUNTER — Telehealth: Payer: Self-pay | Admitting: Oncology

## 2015-02-27 NOTE — Telephone Encounter (Signed)
Brooke Wood called and said she felt some swelling under her left arm this morning.  She denies having any redness or warmth of the skin in the area.  She denies having a fever.  She would like to see Dr. Sondra Come this week.  Advised her that the schedulers will call her today with an appointment.

## 2015-02-28 ENCOUNTER — Ambulatory Visit
Admission: RE | Admit: 2015-02-28 | Discharge: 2015-02-28 | Disposition: A | Discharge status: Home / Self Care | Payer: 59 | Source: Ambulatory Visit | Attending: Radiation Oncology | Admitting: Radiation Oncology

## 2015-02-28 ENCOUNTER — Encounter: Payer: Self-pay | Admitting: Radiation Oncology

## 2015-02-28 VITALS — BP 125/82 | HR 88 | Temp 98.5°F | Ht 65.0 in | Wt 145.6 lb

## 2015-02-28 DIAGNOSIS — C50411 Malignant neoplasm of upper-outer quadrant of right female breast: Secondary | ICD-10-CM

## 2015-02-28 NOTE — Progress Notes (Signed)
Brooke Wood here for follow up.  She denies pain.  She has noticed some swelling under her left arm yesterday morning when she was shaving.  She is taking tamoxifen and is having frequent hot flashes.  The skin on her right breast has hyperpigmentation.  She does have some slight swelling under her left arm.    BP 125/82 mmHg  Pulse 88  Temp(Src) 98.5 F (36.9 C) (Oral)  Ht 5\' 5"  (1.651 m)  Wt 145 lb 9.6 oz (66.044 kg)  BMI 24.23 kg/m2

## 2015-02-28 NOTE — Progress Notes (Signed)
  Radiation Oncology         [phone] ________________________________  Name: Brooke Wood MRN: [id number]  Date: 02/28/2015  DOB: 1966/01/07  Follow-Up Visit Note  CC: [name], MD  Consuela Mimes, MD    ICD-9-CM ICD-10-CM   1. Malignant neoplasm of upper-outer quadrant of right female breast ([place]) 174.4 C50.411     Diagnosis:  Clinical stage II right breast cancer  Interval Since Last Radiation:  2 years and 7 months  Narrative:  The patient returns today earlier than her scheduled follow-up appointment in January. The patient noticed some swelling in her left axillary region and wishes to be seen for this issue. She denies any pain in this area swelling in her left arm or hand. She denies any pain within the left breast nipple discharge or bleeding. She denies any pain along the right breast for discharge or bleeding. She continues on tamoxifen. She does have hot flashes but otherwise is tolerating this medication reasonably well.  She continues to work part-time at [employer].                       ALLERGIES:  is allergic to aspirin; codeine; and codeine phosphate.  Meds: Current Outpatient Prescriptions  Medication Sig Dispense Refill  . cetirizine (ZYRTEC) 10 MG tablet Take 10 mg by mouth daily.    . potassium chloride SA (K-DUR,KLOR-CON) 20 MEQ tablet TAKE 1 TABLET BY MOUTH THREE TIMES A DAY (Patient taking differently: TAKE 1 TABLET BY MOUTH DAILY) 90 tablet 5  . tamoxifen (NOLVADEX) 20 MG tablet Take 1 tablet (20 mg total) by mouth daily. 30 tablet 2  . valsartan-hydrochlorothiazide (DIOVAN-HCT) 160-25 MG per tablet TAKE 1 TABLET BY MOUTH ONCE DAILY 90 tablet 3   No current facility-administered medications for this encounter.    Physical Findings: The patient is in no acute distress. Patient is alert and oriented.  height is 5\' 5"  (1.651 m) and weight is 145 lb 9.6 oz (66.044 kg). Her oral temperature is 98.5 F (36.9 C). Her blood pressure is 125/82 and her  pulse is 88. Marland Kitchen  No palpable supraclavicular or axillary adenopathy. Lungs are clear to auscultation. The heart has regular rhythm and rate. Examination of the left breast reveals no palpable mass or nipple discharge. Careful examination of the axillary region reveals no significant swelling or lymph nodes. Examination of the right breast reveals hyperpigmentation changes from her previous radiation therapy. The right breast is more firm than the left. No dominant mass within the breast nipple discharge or bleeding.  Lab Findings: Lab Results  Component Value Date   WBC 6.4 11/03/2014   HGB 13.5 11/03/2014   HCT 40.8 11/03/2014   MCV 88.6 11/03/2014   PLT 205 11/03/2014    Radiographic Findings: No results found.  Impression:  No evidence for recurrence on clinical exam today. Patient will proceed with her bilateral mammograms in January. She will be seen for routine follow-up in 6 months and will continue follow-up with medical oncology on a periodic basis.   Plan:  Routine follow-up in 6 months.  ____________________________________ Gery Pray, MD

## 2015-03-28 ENCOUNTER — Other Ambulatory Visit: Payer: Self-pay | Admitting: Oncology

## 2015-03-28 DIAGNOSIS — Z853 Personal history of malignant neoplasm of breast: Secondary | ICD-10-CM

## 2015-03-29 ENCOUNTER — Telehealth: Payer: Self-pay | Admitting: Internal Medicine

## 2015-03-29 NOTE — Telephone Encounter (Signed)
Pt called asking to be work in today. Pt stated she has sinus and hip pain from arthritis. Advise pt we do not have anything at all today, offer tomorrow appt and for pt to speak to the nurse (pt wants to see if Dr. Ronnald Ramp will work her in first). Please advise.   Phone # [phone]

## 2015-03-29 NOTE — Telephone Encounter (Signed)
Not today.

## 2015-03-29 NOTE — Telephone Encounter (Signed)
Pt has an appt 04/03/14 with Dr. Ronnald Ramp. Cancel this request

## 2015-04-04 ENCOUNTER — Encounter: Payer: Self-pay | Admitting: Internal Medicine

## 2015-04-04 ENCOUNTER — Ambulatory Visit (INDEPENDENT_AMBULATORY_CARE_PROVIDER_SITE_OTHER): Payer: BLUE CROSS/BLUE SHIELD | Admitting: Internal Medicine

## 2015-04-04 VITALS — BP 118/86 | HR 93 | Temp 98.4°F | Resp 16 | Ht 65.0 in | Wt 147.0 lb

## 2015-04-04 DIAGNOSIS — I10 Essential (primary) hypertension: Secondary | ICD-10-CM

## 2015-04-04 DIAGNOSIS — J069 Acute upper respiratory infection, unspecified: Secondary | ICD-10-CM | POA: Diagnosis not present

## 2015-04-04 DIAGNOSIS — Z23 Encounter for immunization: Secondary | ICD-10-CM | POA: Diagnosis not present

## 2015-04-04 DIAGNOSIS — B9789 Other viral agents as the cause of diseases classified elsewhere: Secondary | ICD-10-CM

## 2015-04-04 MED ORDER — PROMETHAZINE-DM 6.25-15 MG/5ML PO SYRP: 5.0000 mL | ORAL_SOLUTION | Freq: Four times a day (QID) | ORAL | Status: DC | PRN

## 2015-04-04 MED FILL — KLOR-CON M20 TABLET: 20 | 30 days supply | Qty: 90 | Fill #2

## 2015-04-04 MED FILL — PROMETHAZINE-DM SYRUP: 6.25-15 | 5 days supply | Qty: 118 | Fill #0

## 2015-04-04 MED FILL — VALSARTAN-HCTZ 160-25 MG TA: 160-25 | 30 days supply | Qty: 30 | Fill #5

## 2015-04-04 MED FILL — TAMOXIFEN 20 MG TABLET: 20 | 90 days supply | Qty: 90 | Fill #8

## 2015-04-04 NOTE — Progress Notes (Signed)
Subjective:  Patient ID: Brooke Wood, female    DOB: [birth year]  Age: 50 y.o. MRN: [id number]  CC: URI and Hypertension   HPI Brooke Wood presents for a 1 week hx of nasal congestion, runny nose, ST, and NP cough- all sx's are quickly resolving.  Outpatient Prescriptions Prior to Visit  Medication Sig Dispense Refill  . potassium chloride SA (K-DUR,KLOR-CON) 20 MEQ tablet TAKE 1 TABLET BY MOUTH THREE TIMES A DAY (Patient taking differently: TAKE 1 TABLET BY MOUTH DAILY) 90 tablet 5  . tamoxifen (NOLVADEX) 20 MG tablet Take 1 tablet (20 mg total) by mouth daily. 30 tablet 2  . valsartan-hydrochlorothiazide (DIOVAN-HCT) 160-25 MG per tablet TAKE 1 TABLET BY MOUTH ONCE DAILY 90 tablet 3  . cetirizine (ZYRTEC) 10 MG tablet Take 10 mg by mouth daily. Reported on 04/04/2015     No facility-administered medications prior to visit.    ROS Review of Systems  Constitutional: Negative.  Negative for fever, chills, diaphoresis, appetite change and fatigue.  HENT: Positive for congestion, postnasal drip, rhinorrhea, sneezing and sore throat. Negative for ear pain, facial swelling, sinus pressure, trouble swallowing and voice change.   Eyes: Negative.   Respiratory: Negative.  Negative for cough, choking, chest tightness, shortness of breath and stridor.   Cardiovascular: Negative.  Negative for chest pain, palpitations and leg swelling.  Gastrointestinal: Negative.  Negative for nausea, vomiting, abdominal pain, diarrhea, constipation and blood in stool.  Endocrine: Negative.   Genitourinary: Negative.  Negative for dysuria, urgency, frequency, hematuria, enuresis and difficulty urinating.  Musculoskeletal: Negative.  Negative for myalgias, back pain and joint swelling.  Skin: Negative.  Negative for color change, pallor and rash.  Allergic/Immunologic: Negative.   Neurological: Negative.   Hematological: Negative.  Negative for adenopathy. Does not bruise/bleed easily.    Psychiatric/Behavioral: Negative.     Objective:  BP 118/86 mmHg  Pulse 93  Temp(Src) 98.4 F (36.9 C) (Oral)  Resp 16  Ht 5\' 5"  (1.651 m)  Wt 147 lb (66.679 kg)  BMI 24.46 kg/m2  SpO2 96%  BP Readings from Last 3 Encounters:  04/04/15 118/86  02/28/15 125/82  11/10/14 129/90    Wt Readings from Last 3 Encounters:  04/04/15 147 lb (66.679 kg)  02/28/15 145 lb 9.6 oz (66.044 kg)  11/10/14 143 lb 3.2 oz (64.955 kg)    Physical Exam  Constitutional: She is oriented to person, place, and time. She appears well-developed and well-nourished.  Non-toxic appearance. She does not have a sickly appearance. She does not appear ill. No distress.  HENT:  Right Ear: Hearing, tympanic membrane, external ear and ear canal normal.  Left Ear: Hearing, tympanic membrane, external ear and ear canal normal.  Nose: No mucosal edema or rhinorrhea. Right sinus exhibits no maxillary sinus tenderness and no frontal sinus tenderness. Left sinus exhibits no maxillary sinus tenderness and no frontal sinus tenderness.  Mouth/Throat: Oropharynx is clear and moist and mucous membranes are normal. Mucous membranes are not pale, not dry and not cyanotic. No oral lesions. No trismus in the jaw. No uvula swelling. No oropharyngeal exudate, posterior oropharyngeal edema, posterior oropharyngeal erythema or tonsillar abscesses.  Eyes: Conjunctivae are normal. Right eye exhibits no discharge. Left eye exhibits no discharge. No scleral icterus.  Neck: Normal range of motion. Neck supple. No JVD present. No tracheal deviation present. No thyromegaly present.  Cardiovascular: Normal rate, regular rhythm, normal heart sounds and intact distal pulses.  Exam reveals no gallop and no friction rub.  No murmur heard. Pulmonary/Chest: Effort normal and breath sounds normal. No stridor. No respiratory distress. She has no wheezes. She has no rales. She exhibits no tenderness.  Abdominal: Soft. Bowel sounds are normal. She  exhibits no distension and no mass. There is no tenderness. There is no rebound and no guarding.  Musculoskeletal: Normal range of motion. She exhibits no edema or tenderness.  Lymphadenopathy:    She has no cervical adenopathy.  Neurological: She is oriented to person, place, and time.  Skin: Skin is warm and dry. No rash noted. She is not diaphoretic. No erythema. No pallor.  Psychiatric: She has a normal mood and affect. Her behavior is normal. Judgment and thought content normal.  Vitals reviewed.   Lab Results  Component Value Date   WBC 6.4 11/03/2014   HGB 13.5 11/03/2014   HCT 40.8 11/03/2014   PLT 205 11/03/2014   GLUCOSE 88 11/03/2014   CHOL 128 01/03/2014   TRIG 73.0 01/03/2014   HDL 46.90 01/03/2014   LDLCALC 67 01/03/2014   ALT 14 11/03/2014   AST 11 11/03/2014   NA 139 11/03/2014   K 3.9 11/03/2014   CL 101 01/03/2014   CL 101 01/03/2014   CREATININE 0.9 11/03/2014   BUN 16.2 11/03/2014   CO2 30* 11/03/2014   TSH 1.57 01/03/2014   HGBA1C 6.1 11/24/2012    No results found.  Assessment & Plan:   Brooke Wood was seen today for uri and hypertension.  Diagnoses and all orders for this visit:  Encounter for immunization  Viral URI with cough- this is viral, antibiotics are not indicated, will offer sx relief -     promethazine-dextromethorphan (PROMETHAZINE-DM) 6.25-15 MG/5ML syrup; Take 5 mLs by mouth 4 (four) times daily as needed for cough.  Essential hypertension, benign- her BP is well controlled  Other orders -     Flu Vaccine QUAD 36+ mos IM   I am having Brooke Wood start on promethazine-dextromethorphan. I am also having her maintain her cetirizine, tamoxifen, potassium chloride SA, and valsartan-hydrochlorothiazide.  Meds ordered this encounter  Medications  . promethazine-dextromethorphan (PROMETHAZINE-DM) 6.25-15 MG/5ML syrup    Sig: Take 5 mLs by mouth 4 (four) times daily as needed for cough.    Dispense:  118 mL    Refill:  0      Follow-up: No Follow-up on file.  [name], MD

## 2015-04-04 NOTE — Patient Instructions (Signed)

## 2015-04-04 NOTE — Progress Notes (Signed)
Pre visit review using our clinic review tool, if applicable. No additional management support is needed unless otherwise documented below in the visit note. 

## 2015-04-19 ENCOUNTER — Other Ambulatory Visit: Payer: Self-pay | Admitting: General Surgery

## 2015-04-19 ENCOUNTER — Encounter: Payer: Self-pay | Admitting: Internal Medicine

## 2015-04-19 ENCOUNTER — Other Ambulatory Visit: Payer: Self-pay | Admitting: Internal Medicine

## 2015-04-19 ENCOUNTER — Ambulatory Visit (INDEPENDENT_AMBULATORY_CARE_PROVIDER_SITE_OTHER): Payer: BLUE CROSS/BLUE SHIELD | Admitting: Internal Medicine

## 2015-04-19 ENCOUNTER — Telehealth: Payer: Self-pay

## 2015-04-19 ENCOUNTER — Other Ambulatory Visit (INDEPENDENT_AMBULATORY_CARE_PROVIDER_SITE_OTHER): Payer: BLUE CROSS/BLUE SHIELD

## 2015-04-19 DIAGNOSIS — R109 Unspecified abdominal pain: Secondary | ICD-10-CM

## 2015-04-19 DIAGNOSIS — Z0000 Encounter for general adult medical examination without abnormal findings: Secondary | ICD-10-CM | POA: Diagnosis not present

## 2015-04-19 DIAGNOSIS — I10 Essential (primary) hypertension: Secondary | ICD-10-CM

## 2015-04-19 LAB — LIPID PANEL
CHOLESTEROL: 167 mg/dL (ref 0–200)
HDL: 55.5 mg/dL (ref >39.00)
LDL Cholesterol: 96 mg/dL (ref 0–99)
NonHDL: 111.18
Total CHOL/HDL Ratio: 3
Triglycerides: 75 mg/dL (ref 0.0–149.0)
VLDL: 15 mg/dL (ref 0.0–40.0)

## 2015-04-19 LAB — COMPREHENSIVE METABOLIC PANEL
ALBUMIN: 4.4 g/dL (ref 3.5–5.2)
ALK PHOS: 84 U/L (ref 39–117)
ALT: 9 U/L (ref 0–35)
AST: 8 U/L (ref 0–37)
BILIRUBIN TOTAL: 0.4 mg/dL (ref 0.2–1.2)
BUN: 16 mg/dL (ref 6–23)
CO2: 26 mEq/L (ref 19–32)
CREATININE: 0.71 mg/dL (ref 0.40–1.20)
Calcium: 10 mg/dL (ref 8.4–10.5)
Chloride: 102 mEq/L (ref 96–112)
GFR: 111.22 mL/min (ref >60.00)
Glucose, Bld: 119 mg/dL — ABNORMAL HIGH (ref 70–99)
Potassium: 3.9 mEq/L (ref 3.5–5.1)
SODIUM: 137 meq/L (ref 135–145)
TOTAL PROTEIN: 7.4 g/dL (ref 6.0–8.3)

## 2015-04-19 LAB — TSH: TSH: 1.88 u[IU]/mL (ref 0.35–4.50)

## 2015-04-19 NOTE — Telephone Encounter (Signed)
Per amy someone called from labs pt refused the following labs: Hep C, HIV, CBC. She complied with LIP, CMP, and Thyroid

## 2015-04-19 NOTE — Progress Notes (Deleted)
Pre visit review using our clinic review tool, if applicable. No additional management support is needed unless otherwise documented below in the visit note. 

## 2015-04-20 NOTE — Progress Notes (Signed)
   Subjective:  Patient ID: Brooke Wood, female    DOB: 24-Feb-1966  Age: 50 y.o. MRN: [id number]  CC: No chief complaint on file.  She was not seen HPI Brooke Wood presents for not seen  Outpatient Prescriptions Prior to Visit  Medication Sig Dispense Refill  . cetirizine (ZYRTEC) 10 MG tablet Take 10 mg by mouth daily. Reported on 04/04/2015    . potassium chloride SA (K-DUR,KLOR-CON) 20 MEQ tablet TAKE 1 TABLET BY MOUTH THREE TIMES A DAY (Patient taking differently: TAKE 1 TABLET BY MOUTH DAILY) 90 tablet 5  . promethazine-dextromethorphan (PROMETHAZINE-DM) 6.25-15 MG/5ML syrup Take 5 mLs by mouth 4 (four) times daily as needed for cough. 118 mL 0  . tamoxifen (NOLVADEX) 20 MG tablet Take 1 tablet (20 mg total) by mouth daily. 30 tablet 2  . valsartan-hydrochlorothiazide (DIOVAN-HCT) 160-25 MG per tablet TAKE 1 TABLET BY MOUTH ONCE DAILY 90 tablet 3   No facility-administered medications prior to visit.    ROS Review of Systems  Objective:  LMP 04/02/2015  BP Readings from Last 3 Encounters:  04/04/15 118/86  02/28/15 125/82  11/10/14 129/90    Wt Readings from Last 3 Encounters:  04/04/15 147 lb (66.679 kg)  02/28/15 145 lb 9.6 oz (66.044 kg)  11/10/14 143 lb 3.2 oz (64.955 kg)    Physical Exam  Lab Results  Component Value Date   WBC 6.4 11/03/2014   HGB 13.5 11/03/2014   HCT 40.8 11/03/2014   PLT 205 11/03/2014   GLUCOSE 119* 04/19/2015   CHOL 167 04/19/2015   TRIG 75.0 04/19/2015   HDL 55.50 04/19/2015   LDLCALC 96 04/19/2015   ALT 9 04/19/2015   AST 8 04/19/2015   NA 137 04/19/2015   K 3.9 04/19/2015   CL 102 04/19/2015   CREATININE 0.71 04/19/2015   BUN 16 04/19/2015   CO2 26 04/19/2015   TSH 1.88 04/19/2015   HGBA1C 6.1 11/24/2012    No results found.  Assessment & Plan:   There are no diagnoses linked to this encounter. I am having Brooke Wood maintain her cetirizine, tamoxifen, potassium chloride SA, valsartan-hydrochlorothiazide, and  promethazine-dextromethorphan.  No orders of the defined types were placed in this encounter.     Follow-up: No Follow-up on file.  [name], MD

## 2015-04-26 ENCOUNTER — Ambulatory Visit
Admission: RE | Admit: 2015-04-26 | Discharge: 2015-04-26 | Disposition: A | Discharge status: Home / Self Care | Payer: BLUE CROSS/BLUE SHIELD | Source: Ambulatory Visit | Attending: General Surgery | Admitting: General Surgery

## 2015-04-26 DIAGNOSIS — R109 Unspecified abdominal pain: Secondary | ICD-10-CM

## 2015-04-26 MED ORDER — IOPAMIDOL (ISOVUE-300) INJECTION 61%
100.0000 mL | Freq: Once | INTRAVENOUS | Status: AC | PRN
  Administered 2015-04-26: 100 mL via INTRAVENOUS

## 2015-05-01 ENCOUNTER — Ambulatory Visit
Admission: RE | Admit: 2015-05-01 | Discharge: 2015-05-01 | Disposition: A | Discharge status: Home / Self Care | Payer: BLUE CROSS/BLUE SHIELD | Source: Ambulatory Visit | Attending: Oncology | Admitting: Oncology

## 2015-05-01 DIAGNOSIS — Z853 Personal history of malignant neoplasm of breast: Secondary | ICD-10-CM

## 2015-05-17 ENCOUNTER — Other Ambulatory Visit (HOSPITAL_BASED_OUTPATIENT_CLINIC_OR_DEPARTMENT_OTHER): Payer: BLUE CROSS/BLUE SHIELD

## 2015-05-17 ENCOUNTER — Ambulatory Visit (HOSPITAL_BASED_OUTPATIENT_CLINIC_OR_DEPARTMENT_OTHER): Payer: BLUE CROSS/BLUE SHIELD | Admitting: Nurse Practitioner

## 2015-05-17 ENCOUNTER — Encounter: Payer: Self-pay | Admitting: Nurse Practitioner

## 2015-05-17 ENCOUNTER — Telehealth: Payer: Self-pay | Admitting: Oncology

## 2015-05-17 VITALS — BP 130/92 | HR 94 | Temp 97.8°F | Resp 18 | Ht 65.0 in | Wt 148.2 lb

## 2015-05-17 DIAGNOSIS — C50911 Malignant neoplasm of unspecified site of right female breast: Secondary | ICD-10-CM

## 2015-05-17 DIAGNOSIS — C50411 Malignant neoplasm of upper-outer quadrant of right female breast: Secondary | ICD-10-CM

## 2015-05-17 LAB — CBC WITH DIFFERENTIAL/PLATELET
BASO%: 0.9 % (ref 0.0–2.0)
BASOS ABS: 0.1 10*3/uL (ref 0.0–0.1)
EOS%: 0.4 % (ref 0.0–7.0)
Eosinophils Absolute: 0 10*3/uL (ref 0.0–0.5)
HCT: 42.3 % (ref 34.8–46.6)
HGB: 13.9 g/dL (ref 11.6–15.9)
LYMPH%: 37 % (ref 14.0–49.7)
MCH: 29.1 pg (ref 25.1–34.0)
MCHC: 32.8 g/dL (ref 31.5–36.0)
MCV: 88.6 fL (ref 79.5–101.0)
MONO#: 1 10*3/uL — ABNORMAL HIGH (ref 0.1–0.9)
MONO%: 10.4 % (ref 0.0–14.0)
NEUT%: 51.3 % (ref 38.4–76.8)
NEUTROS ABS: 4.8 10*3/uL (ref 1.5–6.5)
PLATELETS: 220 10*3/uL (ref 145–400)
RBC: 4.77 10*6/uL (ref 3.70–5.45)
RDW: 13.7 % (ref 11.2–14.5)
WBC: 9.4 10*3/uL (ref 3.9–10.3)
lymph#: 3.5 10*3/uL — ABNORMAL HIGH (ref 0.9–3.3)

## 2015-05-17 LAB — COMPREHENSIVE METABOLIC PANEL
ALBUMIN: 4 g/dL (ref 3.5–5.0)
ALK PHOS: 83 U/L (ref 40–150)
ALT: 12 U/L (ref 0–55)
AST: 10 U/L (ref 5–34)
Anion Gap: 10 mEq/L (ref 3–11)
BILIRUBIN TOTAL: 0.37 mg/dL (ref 0.20–1.20)
BUN: 10.7 mg/dL (ref 7.0–26.0)
CALCIUM: 9.7 mg/dL (ref 8.4–10.4)
CO2: 25 mEq/L (ref 22–29)
CREATININE: 0.8 mg/dL (ref 0.6–1.1)
Chloride: 103 mEq/L (ref 98–109)
EGFR: >90 mL/min/{1.73_m2} (ref >90)
Glucose: 87 mg/dl (ref 70–140)
Potassium: 4.3 mEq/L (ref 3.5–5.1)
SODIUM: 138 meq/L (ref 136–145)
Total Protein: 7.4 g/dL (ref 6.4–8.3)

## 2015-05-17 MED FILL — VALSARTAN-HCTZ 160-25 MG TA: 160-25 | 30 days supply | Qty: 30 | Fill #6

## 2015-05-17 NOTE — Progress Notes (Signed)
ID: [name] OB: [birth year]  MR#: [id number]  HTD#:[id number]  PCP: Brooke Calico, MD GYN:   SU:  OTHER MD:  CHIEF COMPLAINT: Right invasive tubular lobular breast cancer  CURRENT THERAPY: Tamoxifen 38m PO daily  BREAST CANCER HISTORY:  TNatajahpalpated a lump in the right breast in early November 2013. [facility] and these were performed on 03/04/2012. The mass measured 2.8cm mammographically. The ultrasound showed a spiculated mass in the 11 o'clock location of the right breast, 8 cm from the nipple measuring 2.9 cm. The right axilla showed no adenopathy.   An ultrasound guided core biopsy of the right breast on 03/04/2012 revealed invasive ductal carcinoma, grade 2, ER positive, PR positive, HER-2/neu negative, Ki-67 20%. An MRI performed on 03/10/2012 showed the mass to be 2.7cm and irregular. No abnormalities were found in the left breast, or either axilla. Her case was discussed at the multidisciplinary breast clinic on 03/11/2012 and referred for genetic counseling and testing.   A right breast lumpectomy with sentinel lymph node biopsy was performed on 04/09/2012. 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 One sentinel node had isolated tumor cells. All margins clean.   The patient had an Oncotype DX performed. Her score was 10, giving her a 7% risk of distance recurrence with tamoxifen for 5 year. She was referred to radiation oncology. Her subsequent history is as follows.   INTERVAL HISTORY: TOceanareturns today for follow up of her breast cancer. She has been on tamoxifen since May 2014 and continues to tolerate it well with no complaints. She has rare hot flashes and denies joint pain or vaginal changes. Her periods continue to be irregular. She will go months with period, then a few days of bleeding 3 weeks  apart this January.   REVIEW OF SYSTEMS: TGwendolinehas few physical complaints. She cannot afford a gym membership at this time now that the LPeridotprogram is done. She is still keeping up with her diet and makes every effort to make good choice. She has no pain. She is not having any right arm swelling today, and is no longer wearing her sleeve. Brooke Wood fevers, A detailed review of systems is otherwise negative. chills, nausea, vomiting, or changes in bowel or bladder habits. She has no shortness of breath, chest pain, cough, or palpitations. She denies headaches, dizziness, or vision changes. A detailed review of systems is otherwise negative.  PAST MEDICAL HISTORY: Past Medical History  Diagnosis Date  . Allergy     Neg RAST 2007  . Hypertension   . Vocal cord dysfunction     Neg Methacholine challenge test 2007  . [place]     right breast  . Hx of radiation therapy 06/15/12 - 07/29/12    right rbeast, 50.4 Gy x 28 fx, boost to cumulative dose 60.4 gray  . Hx of adenomatous colonic polyps 03/03/2014   PAST SURGICAL HISTORY: Past Surgical History  Procedure Laterality Date  . Breast surgery  1985    rt br bx  . Breast lumpectomy with sentinel lymph node biopsy  04/09/2012    Procedure: BREAST LUMPECTOMY WITH SENTINEL LYMPH NODE BX;  Surgeon: PMerrie Roof MD;  [facility]  Service: General;  Laterality: Right;   FAMILY HISTORY Family History  Problem Relation Age of Onset  .  Hypertension Other   . Alcohol abuse Neg Hx   . Diabetes Neg Hx   . Early death Neg Hx   . Hearing loss Neg Hx   . Heart disease Neg Hx   . Hyperlipidemia Neg Hx   . Stroke Neg Hx   . Kidney disease Neg Hx   . Colon cancer Neg Hx   . Breast cancer Neg Hx   . Lung cancer Father 69    smoker  . Cancer Father     lung  Her mother is still alive in her 84s and is in good health. Her father passed away when she was a teenager from lung cancer. Her 3 brothers are  younger, and have no significant health history that she can recall.   GYNECOLOGIC HISTORY:  Menarche occurred at age 24. The patient is GX P1, giving birth to her son about age 12-25. She is still menstruating. She was on birth control pills for 10+ years.   SOCIAL HISTORY: Brooke Wood lives at home alone. 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 Her son, Brooke Wood is 16. He graduated with his masters degree in sports education and Mudlogger from [employer] in May 2015. Her mother, Brooke Wood, lives 5 minutes away and is supportive. 2 of her brothers reside in [place], the other in [place]. She was married to her son's father for 9-10 years, but they got divorced in her 54s. She does not currently have a significant other. She is a Psychologist, forensic but "still needs to find a good church home."    ADVANCED DIRECTIVES: Not in place at this time. She intends to name her son, Brooke Wood, as her healthcare power of attorney. "Brooke Wood" can be reached at [phone].   HEALTH MAINTENANCE: Social History  Substance Use Topics  . Smoking status: Never Smoker   . Smokeless tobacco: Never Used  . Alcohol Use: No    Colonoscopy: "needs to schedule when insurance obtained" Bone Density Scan: never Pap Smear: UTD/2013 Lipid Panel:    Allergies  Allergen Reactions  . Aspirin Hives  . Codeine Other (See Comments)    "confusion"  . Codeine Phosphate     REACTION: unspecified    Current Outpatient Prescriptions  Medication Sig Dispense Refill  . cetirizine (ZYRTEC) 10 MG tablet Take 10 mg by mouth daily. Reported on 04/04/2015    . potassium chloride SA (K-DUR,KLOR-CON) 20 MEQ tablet TAKE 1 TABLET BY MOUTH THREE TIMES A DAY (Patient taking differently: TAKE 1 TABLET BY MOUTH DAILY) 90 tablet 5  . tamoxifen (NOLVADEX) 20 MG tablet Take 1 tablet (20 mg total) by mouth  daily. 30 tablet 2  . valsartan-hydrochlorothiazide (DIOVAN-HCT) 160-25 MG per tablet TAKE 1 TABLET BY MOUTH ONCE DAILY 90 tablet 3  . promethazine-dextromethorphan (PROMETHAZINE-DM) 6.25-15 MG/5ML syrup Take 5 mLs by mouth 4 (four) times daily as needed for cough. (Patient not taking: Reported on 05/17/2015) 118 mL 0   No current facility-administered medications for this visit.    OBJECTIVE: Filed Vitals:   05/17/15 1041  BP: 130/92  Pulse: 94  Temp: 97.8 F (36.6 C)  Resp: 18     Body mass index is 24.66 kg/(m^2).   ROS   Physical Exam`  Skin: warm, dry  HEENT: sclerae anicteric, conjunctivae pink, oropharynx clear. No thrush or mucositis.  Lymph Nodes: No cervical or  supraclavicular lymphadenopathy  Lungs: clear to auscultation bilaterally, no rales, wheezes, or rhonci  Heart: regular rate and rhythm  Abdomen: round, soft, non tender, positive bowel sounds  Musculoskeletal: No focal spinal tenderness, no peripheral edema  Neuro: non focal, well oriented, positive affect  Breasts: right breast status post lumpectomy and radiation. Hyperpigmentation fading. No evidence of recurrent disease. Right axilla benign. Left breast unremarkable.  ECOG FS:0 - Asymptomatic  LAB RESULTS: Lab Results  Component Value Date   WBC 9.4 05/17/2015   HGB 13.9 05/17/2015   HCT 42.3 05/17/2015   MCV 88.6 05/17/2015   PLT 220 05/17/2015     Chemistry      Component Value Date/Time   NA 138 05/17/2015 0954   NA 137 04/19/2015 0838   K 4.3 05/17/2015 0954   K 3.9 04/19/2015 0838   CL 102 04/19/2015 0838   CL 98 07/24/2012 1248   CO2 25 05/17/2015 0954   CO2 26 04/19/2015 0838   BUN 10.7 05/17/2015 0954   BUN 16 04/19/2015 0838   CREATININE 0.8 05/17/2015 0954   CREATININE 0.71 04/19/2015 0838      Component Value Date/Time   CALCIUM 9.7 05/17/2015 0954   CALCIUM 10.0 04/19/2015 0838   ALKPHOS 83 05/17/2015 0954   ALKPHOS 84 04/19/2015 0838   AST 10 05/17/2015 0954   AST 8  04/19/2015 0838   ALT 12 05/17/2015 0954   ALT 9 04/19/2015 0838   BILITOT 0.37 05/17/2015 0954   BILITOT 0.4 04/19/2015 0838      Urinalysis No results found for: COLORURINE  STUDIES: Most recent mammogram on 05/01/15 was unremarkable.   ASSESSMENT:  50 y.o. [place] woman   (1) status post right breast biopsy on 03/11/2012 for a clinical invasive ductal carcinoma with tubular features, grade 2, ER positive, PR positive, HER-2/neu negative, Ki-67 20%  (2) status post definitive right lumpectomy and sentinel lymph node biopsy on 04/09/2012. This revealed a 2.8 cm invasive tubular lobular carcinoma,T2 N0i, stage IIA, grade 2, ER 99%, PR 4%, HER-2/neu negative, Ki-67 60%. One sentinel node had isolated tumor cells. All margins clean.   (3) Oncotype DX performed. Her score was 10, giving her a 7% risk of distance recurrence with tamoxifen for 5 year.   (4) She completed radiation therapy in April 2015  (5) She began tamoxifen in May 2014 with a plan to continue therapy for 10 years  PLAN:  Kiara is doing well as far as her breast cancer is concerned. She is now 3 years out from her definitive surgery with no evidence of recurrent disease. The labs were reviewed in detail and were entirely stable. Her most recent mammogram was benign. She had a CT of the abdomen and pelvis ordered by Dr. Marlou Starks that showed no acute findings. She will continue with tamoxifen, as she is tolerating it well, with the goal of 10 years of antiestrogen therapy.   As usual, we spent the last 10 minutes of our visit discussing healthy eating behaviors. I am impressed by her dedication to her diet. She has found that eating healthy is expensive however, and has applied for food stamps which helps her out to some degree with her grocery bill. We talked about simple exercises she can perform at home, such as calf raises and squats. [facility], which would be well  used.   Keziyah will return in 6 months for labs and a follow up  visit. She understands and agrees with this plan. She knows the goal of treatment in her case is cure. She has been encouraged to call with any issues that might arise before her next visit here.  Laurie Panda, NP 05/17/2015 2:31 PM

## 2015-05-17 NOTE — Telephone Encounter (Signed)
Gave patient avs report and appointments for May  °

## 2015-05-23 ENCOUNTER — Other Ambulatory Visit: Payer: Self-pay | Admitting: Nurse Practitioner

## 2015-05-23 ENCOUNTER — Ambulatory Visit: Payer: BLUE CROSS/BLUE SHIELD | Admitting: Internal Medicine

## 2015-05-23 DIAGNOSIS — M542 Cervicalgia: Secondary | ICD-10-CM

## 2015-05-25 ENCOUNTER — Other Ambulatory Visit: Payer: Self-pay | Admitting: Nurse Practitioner

## 2015-05-25 ENCOUNTER — Telehealth: Payer: Self-pay | Admitting: Oncology

## 2015-05-25 ENCOUNTER — Other Ambulatory Visit: Payer: Self-pay | Admitting: *Deleted

## 2015-05-25 DIAGNOSIS — R2232 Localized swelling, mass and lump, left upper limb: Secondary | ICD-10-CM

## 2015-05-25 DIAGNOSIS — N63 Unspecified lump in unspecified breast: Secondary | ICD-10-CM

## 2015-05-25 DIAGNOSIS — C50411 Malignant neoplasm of upper-outer quadrant of right female breast: Secondary | ICD-10-CM

## 2015-05-25 NOTE — Telephone Encounter (Signed)
Spoke with patient re appointment for mammo/us 2/28 @ 2:45 pm (arrival) at [facility]. Per [facility] patient will need to have mammo with [place] due to this is a new problem. Order for mammo entered HB to update/change [place] order. No other orders per 2/23 pof.

## 2015-05-25 NOTE — Telephone Encounter (Signed)
Spoke with patient re doppler for 2/24 @ 10 am at [facility].

## 2015-05-26 ENCOUNTER — Ambulatory Visit (HOSPITAL_COMMUNITY)
Admission: RE | Admit: 2015-05-26 | Discharge: 2015-05-26 | Disposition: A | Discharge status: Home / Self Care | Payer: BLUE CROSS/BLUE SHIELD | Source: Ambulatory Visit | Attending: Nurse Practitioner | Admitting: Nurse Practitioner

## 2015-05-26 ENCOUNTER — Telehealth: Payer: Self-pay | Admitting: *Deleted

## 2015-05-26 DIAGNOSIS — M542 Cervicalgia: Secondary | ICD-10-CM | POA: Diagnosis not present

## 2015-05-26 DIAGNOSIS — M7989 Other specified soft tissue disorders: Secondary | ICD-10-CM | POA: Diagnosis not present

## 2015-05-26 NOTE — Progress Notes (Signed)
VASCULAR LAB PRELIMINARY  PRELIMINARY  PRELIMINARY  PRELIMINARY  Left upper extremity venous duplex completed.    Preliminary report:  Left:  No evidence of DVT or superficial thrombosis.    Mareon Robinette, RVT 05/26/2015, 10:23 AM

## 2015-05-29 ENCOUNTER — Other Ambulatory Visit: Payer: Self-pay

## 2015-05-29 ENCOUNTER — Other Ambulatory Visit: Payer: Self-pay | Admitting: Nurse Practitioner

## 2015-05-29 ENCOUNTER — Other Ambulatory Visit: Payer: Self-pay | Admitting: Oncology

## 2015-05-29 DIAGNOSIS — C50411 Malignant neoplasm of upper-outer quadrant of right female breast: Secondary | ICD-10-CM

## 2015-05-29 DIAGNOSIS — N63 Unspecified lump in unspecified breast: Secondary | ICD-10-CM

## 2015-05-29 DIAGNOSIS — R2232 Localized swelling, mass and lump, left upper limb: Secondary | ICD-10-CM

## 2015-05-30 ENCOUNTER — Ambulatory Visit: Admission: RE | Admit: 2015-05-30 | Payer: BLUE CROSS/BLUE SHIELD | Source: Ambulatory Visit

## 2015-05-30 ENCOUNTER — Other Ambulatory Visit: Payer: Self-pay | Admitting: Nurse Practitioner

## 2015-05-30 ENCOUNTER — Ambulatory Visit
Admission: RE | Admit: 2015-05-30 | Discharge: 2015-05-30 | Disposition: A | Discharge status: Home / Self Care | Payer: BLUE CROSS/BLUE SHIELD | Source: Ambulatory Visit | Attending: Nurse Practitioner | Admitting: Nurse Practitioner

## 2015-05-30 DIAGNOSIS — R2232 Localized swelling, mass and lump, left upper limb: Secondary | ICD-10-CM

## 2015-06-01 ENCOUNTER — Telehealth: Payer: Self-pay | Admitting: Internal Medicine

## 2015-06-01 NOTE — Telephone Encounter (Signed)
[facility] - Client [place] Patient Name: WILENE ORLIK DOB: 1965-11-06 Initial Comment Caller states she ate her lunch and now her stomach is cramping - abdominal only - severe Nurse Assessment Nurse: Martyn Ehrich, RN, Felicia Date/Time (Eastern Time): 06/01/2015 3:31:08 PM Confirm and document reason for call. If symptomatic, describe symptoms. You must click the next button to save text entered. ---PT has abdominal pain cramping started around 3 pm and vomiting (2x) onset 20 min ago. No fever. No diarrhea Has the patient traveled out of the country within the last 30 days? ---No Does the patient have any new or worsening symptoms? ---Yes Will a triage be completed? ---Yes Related visit to physician within the last 2 weeks? ---NoDoes the PT have any chronic conditions? (i.e. diabetes, asthma, etc.) ---No Is the patient pregnant or possibly pregnant? (Ask all females between the ages of 29-55) ---No Is this a behavioral health or substance abuse call? ---No Guidelines Guideline Title Affirmed Question Affirmed Notes Vomiting MILD or MODERATE vomiting (e.g., 1 - 5 times / day) (all triage questions negative) Final Disposition User [facility] [place], RN, [facility] Disagree/Comply: Comply Call Id: [phone]

## 2015-06-05 ENCOUNTER — Other Ambulatory Visit: Payer: Self-pay | Admitting: Nurse Practitioner

## 2015-06-16 NOTE — Telephone Encounter (Signed)
No entry 

## 2015-06-19 MED FILL — VALSARTAN-HCTZ 160-25 MG TA: 160-25 | 30 days supply | Qty: 30 | Fill #7

## 2015-06-19 MED FILL — KLOR-CON M20 TABLET: 20 | 30 days supply | Qty: 90 | Fill #3

## 2015-07-11 ENCOUNTER — Other Ambulatory Visit: Payer: Self-pay | Admitting: Nurse Practitioner

## 2015-07-11 DIAGNOSIS — C50911 Malignant neoplasm of unspecified site of right female breast: Secondary | ICD-10-CM

## 2015-07-11 MED FILL — TAMOXIFEN 20 MG TABLET: 20 | 30 days supply | Qty: 30 | Fill #9

## 2015-07-12 MED FILL — VALSARTAN-HCTZ 160-25 MG TA: 160-25 | 90 days supply | Qty: 90 | Fill #8

## 2015-07-18 ENCOUNTER — Encounter: Payer: Self-pay | Admitting: Internal Medicine

## 2015-07-18 ENCOUNTER — Ambulatory Visit (INDEPENDENT_AMBULATORY_CARE_PROVIDER_SITE_OTHER)
Admission: RE | Admit: 2015-07-18 | Discharge: 2015-07-18 | Disposition: A | Discharge status: Home / Self Care | Payer: BLUE CROSS/BLUE SHIELD | Source: Ambulatory Visit | Attending: Internal Medicine | Admitting: Internal Medicine

## 2015-07-18 ENCOUNTER — Ambulatory Visit (INDEPENDENT_AMBULATORY_CARE_PROVIDER_SITE_OTHER): Payer: BLUE CROSS/BLUE SHIELD | Admitting: Internal Medicine

## 2015-07-18 VITALS — BP 120/82 | HR 88 | Temp 98.3°F | Resp 16 | Ht 65.0 in | Wt 151.0 lb

## 2015-07-18 DIAGNOSIS — R0789 Other chest pain: Secondary | ICD-10-CM | POA: Diagnosis not present

## 2015-07-18 DIAGNOSIS — I10 Essential (primary) hypertension: Secondary | ICD-10-CM

## 2015-07-18 DIAGNOSIS — R0781 Pleurodynia: Secondary | ICD-10-CM | POA: Diagnosis not present

## 2015-07-18 MED ORDER — CETIRIZINE HCL 10 MG PO TABS: 10.0000 mg | ORAL_TABLET | Freq: Every day | ORAL | Status: DC

## 2015-07-18 NOTE — Patient Instructions (Signed)

## 2015-07-18 NOTE — Progress Notes (Signed)
Pre visit review using our clinic review tool, if applicable. No additional management support is needed unless otherwise documented below in the visit note. 

## 2015-07-18 NOTE — Progress Notes (Signed)
Subjective:  Patient ID: Brooke Wood, female    DOB: [birth year]  Age: 50 y.o. MRN: [id number]  CC: Hypertension and Chest Pain   HPI XI PEARCEY presents for a BP check - she complains of a 1 week hx of pain over her right lower/posterior rib area, she had a fall a week or 2 prior to the pain onset but no recent trauma or injury. She describes the pain as intermittent aching, soreness, and stiffness. The pain is exacerbated by movement but not breathing. She has taken tylenol for the pain. She denies abdominal pain, chest pain, coughing, dysuria, hematuria, diarrhea, constipation, rash, nausea, vomiting, fever, or chills.  Outpatient Prescriptions Prior to Visit  Medication Sig Dispense Refill  . potassium chloride SA (K-DUR,KLOR-CON) 20 MEQ tablet TAKE 1 TABLET BY MOUTH THREE TIMES A DAY (Patient taking differently: TAKE 1 TABLET BY MOUTH DAILY) 90 tablet 5  . tamoxifen (NOLVADEX) 20 MG tablet TAKE 1 TABLET BY MOUTH ONCE DAILY 90 tablet 3  . valsartan-hydrochlorothiazide (DIOVAN-HCT) 160-25 MG per tablet TAKE 1 TABLET BY MOUTH ONCE DAILY 90 tablet 3  . cetirizine (ZYRTEC) 10 MG tablet Take 10 mg by mouth daily. Reported on 04/04/2015    . promethazine-dextromethorphan (PROMETHAZINE-DM) 6.25-15 MG/5ML syrup Take 5 mLs by mouth 4 (four) times daily as needed for cough. (Patient not taking: Reported on 05/17/2015) 118 mL 0   No facility-administered medications prior to visit.    ROS Review of Systems  Constitutional: Negative.  Negative for fever, chills, diaphoresis, appetite change and fatigue.  HENT: Negative.   Eyes: Negative.  Negative for visual disturbance.  Respiratory: Negative.  Negative for cough, choking, chest tightness, shortness of breath and stridor.   Cardiovascular: Negative.  Negative for chest pain, palpitations and leg swelling.  Gastrointestinal: Negative.  Negative for nausea, vomiting, abdominal pain, diarrhea, constipation and blood in stool.  Endocrine:  Negative.   Genitourinary: Positive for flank pain. Negative for dysuria, urgency, frequency, hematuria, decreased urine volume and difficulty urinating.  Musculoskeletal: Negative.  Negative for myalgias, back pain, joint swelling and arthralgias.  Skin: Negative.  Negative for color change, rash and wound.  Allergic/Immunologic: Negative.   Neurological: Negative.   Hematological: Negative.   Psychiatric/Behavioral: Negative.     Objective:  BP 120/82 mmHg  Pulse 88  Temp(Src) 98.3 F (36.8 C) (Oral)  Resp 16  Ht 5\' 5"  (1.651 m)  Wt 151 lb (68.493 kg)  BMI 25.13 kg/m2  SpO2 93%  BP Readings from Last 3 Encounters:  07/18/15 120/82  05/17/15 130/92  04/04/15 118/86    Wt Readings from Last 3 Encounters:  07/18/15 151 lb (68.493 kg)  05/17/15 148 lb 3.2 oz (67.223 kg)  04/04/15 147 lb (66.679 kg)    Physical Exam  Constitutional: She is oriented to person, place, and time.  Non-toxic appearance. She does not have a sickly appearance. She does not appear ill. No distress.  HENT:  Mouth/Throat: Oropharynx is clear and moist. No oropharyngeal exudate.  Eyes: Conjunctivae are normal. Right eye exhibits no discharge. Left eye exhibits no discharge. No scleral icterus.  Neck: Normal range of motion. Neck supple. No JVD present. No tracheal deviation present. No thyromegaly present.  Cardiovascular: Normal rate, regular rhythm, normal heart sounds and intact distal pulses.  Exam reveals no gallop and no friction rub.   No murmur heard. Pulmonary/Chest: Effort normal and breath sounds normal. No stridor. No respiratory distress. She has no wheezes. She has no rales. Chest wall is  not dull to percussion. She exhibits bony tenderness. She exhibits no mass, no tenderness, no laceration, no crepitus, no edema, no deformity, no swelling and no retraction.    Abdominal: Soft. Bowel sounds are normal. She exhibits no distension and no mass. There is no tenderness. There is no rebound and  no guarding.  Musculoskeletal: Normal range of motion. She exhibits no edema or tenderness.  Lymphadenopathy:    She has no cervical adenopathy.  Neurological: She is oriented to person, place, and time.  Skin: Skin is warm and dry. No rash noted. She is not diaphoretic. No erythema. No pallor.  Vitals reviewed.   Lab Results  Component Value Date   WBC 9.4 05/17/2015   HGB 13.9 05/17/2015   HCT 42.3 05/17/2015   PLT 220 05/17/2015   GLUCOSE 87 05/17/2015   CHOL 167 04/19/2015   TRIG 75.0 04/19/2015   HDL 55.50 04/19/2015   LDLCALC 96 04/19/2015   ALT 12 05/17/2015   AST 10 05/17/2015   NA 138 05/17/2015   K 4.3 05/17/2015   CL 102 04/19/2015   CREATININE 0.8 05/17/2015   BUN 10.7 05/17/2015   CO2 25 05/17/2015   TSH 1.88 04/19/2015   HGBA1C 6.1 11/24/2012    [place] Extrem Up Left Ltd  05/30/2015  CLINICAL DATA:  Patient describes palpable thickening within the left axilla. History of right breast cancer status post breast conservation therapy. EXAM: ULTRASOUND LEFT UPPER EXTREMITY LIMITED (AXILLA) TECHNIQUE: Ultrasound examination of the upper extremity soft tissues was performed in the area of clinical concern. COMPARISON:  Previous exams including recent bilateral mammogram dated 05/01/2015. FINDINGS: Ultrasound of the left axilla was performed, as directed by the patient. Only normal soft tissues are seen within the left axilla. No suspicious solid or cystic masses. No enlarged lymph nodes. IMPRESSION: No sonographic evidence of malignancy within the left axilla. RECOMMENDATIONS: Continued annual mammograms. Next bilateral mammogram will be due in January of 2018. Electronically Signed   By: Franki Cabot M.D.   On: 05/30/2015 17:15    Assessment & Plan:   Meenakshi was seen today for hypertension and chest pain.  Diagnoses and all orders for this visit:  Essential hypertension, benign- Her blood pressure is well-controlled, will continue to combination of an ARB and  hydrochlorothiazide.  Right-sided chest wall pain- exam is consistent with musculoskeletal chest wall pain, her plain films are negative for rib fracture, she will continue Tylenol as needed. -     DG Ribs Unilateral W/Chest Right; Future  Other orders -     cetirizine (ZYRTEC) 10 MG tablet; Take 1 tablet (10 mg total) by mouth daily. Reported on 04/04/2015  I have discontinued Ms. Galea's promethazine-dextromethorphan. I have also changed her cetirizine. Additionally, I am having her maintain her potassium chloride SA, valsartan-hydrochlorothiazide, and tamoxifen.  Meds ordered this encounter  Medications  . cetirizine (ZYRTEC) 10 MG tablet    Sig: Take 1 tablet (10 mg total) by mouth daily. Reported on 04/04/2015    Dispense:  90 tablet    Refill:  1     Follow-up: Return if symptoms worsen or fail to improve.  [name], MD

## 2015-08-04 ENCOUNTER — Other Ambulatory Visit: Payer: Self-pay | Admitting: Nurse Practitioner

## 2015-08-04 MED FILL — TAMOXIFEN 20 MG TABLET: 20 | 30 days supply | Qty: 30 | Fill #0

## 2015-08-07 ENCOUNTER — Telehealth: Payer: Self-pay | Admitting: Nurse Practitioner

## 2015-08-07 NOTE — Telephone Encounter (Signed)
Spoke with patient to confirm May appt change to 6/15 per HB 5/5 pof

## 2015-08-16 ENCOUNTER — Ambulatory Visit: Payer: BLUE CROSS/BLUE SHIELD | Admitting: Nurse Practitioner

## 2015-09-07 ENCOUNTER — Encounter: Payer: Self-pay | Admitting: Radiation Oncology

## 2015-09-07 ENCOUNTER — Ambulatory Visit
Admission: RE | Admit: 2015-09-07 | Discharge: 2015-09-07 | Disposition: A | Discharge status: Home / Self Care | Payer: BLUE CROSS/BLUE SHIELD | Source: Ambulatory Visit | Attending: Radiation Oncology | Admitting: Radiation Oncology

## 2015-09-07 ENCOUNTER — Other Ambulatory Visit: Payer: Self-pay | Admitting: Nurse Practitioner

## 2015-09-07 VITALS — BP 125/77 | HR 81 | Temp 98.0°F | Ht 65.0 in | Wt 154.6 lb

## 2015-09-07 DIAGNOSIS — C50411 Malignant neoplasm of upper-outer quadrant of right female breast: Secondary | ICD-10-CM

## 2015-09-07 MED FILL — TAMOXIFEN 20 MG TABLET: 20 | 30 days supply | Qty: 30 | Fill #0

## 2015-09-07 NOTE — Progress Notes (Signed)
Brooke Wood is here for follow up of radiation completed 07/29/12 to her Right Breast. She reports some depression, and plans to speak to Chestine Spore in survivorship today. She is taking Tamoxifen daily. Her last mammogram was 05/01/15. She denies any pain to her breast or other concerns. She denies any swelling to her breast.  BP 125/77 mmHg  Pulse 81  Temp(Src) 98 F (36.7 C)  Ht 5\' 5"  (1.651 m)  Wt 154 lb 9.6 oz (70.126 kg)  BMI 25.73 kg/m2   Wt Readings from Last 3 Encounters:  09/07/15 154 lb 9.6 oz (70.126 kg)  07/18/15 151 lb (68.493 kg)  05/17/15 148 lb 3.2 oz (67.223 kg)

## 2015-09-07 NOTE — Progress Notes (Signed)
  Radiation Oncology         ([phone]) [phone] ________________________________  Name: Brooke Wood MRN: [id number]  Date: 09/07/2015  DOB: 19-Jan-1966  Follow-Up Visit Note  CC: [name], MD  Consuela Mimes, MD  No diagnosis found.  Diagnosis:  Clinical stage II right breast cancer  Interval Since Last Radiation: ~ 3 years   Narrative: Brooke Wood is here for follow up of radiation completed 07/29/12 to her right Breast. She reports some depression, and plans to speak to Chestine Spore in survivorship today. She denies any suicidal thoughts. She is taking Tamoxifen daily. Her last mammogram was 05/01/15. She denies any pain to her breast or other concerns. She denies any swelling to her breast.                       ALLERGIES:  is allergic to aspirin; codeine; and codeine phosphate.  Meds: Current Outpatient Prescriptions  Medication Sig Dispense Refill  . cetirizine (ZYRTEC) 10 MG tablet Take 1 tablet (10 mg total) by mouth daily. Reported on 04/04/2015 90 tablet 1  . potassium chloride SA (K-DUR,KLOR-CON) 20 MEQ tablet TAKE 1 TABLET BY MOUTH THREE TIMES A DAY (Patient taking differently: TAKE 1 TABLET BY MOUTH DAILY) 90 tablet 5  . tamoxifen (NOLVADEX) 20 MG tablet TAKE 1 TABLET BY MOUTH ONCE DAILY 90 tablet 3  . valsartan-hydrochlorothiazide (DIOVAN-HCT) 160-25 MG per tablet TAKE 1 TABLET BY MOUTH ONCE DAILY 90 tablet 3  . tamoxifen (NOLVADEX) 20 MG tablet TAKE 1 TABLET BY MOUTH ONCE DAILY 30 tablet 3   No current facility-administered medications for this encounter.    Physical Findings: The patient is in no acute distress. Patient is alert and oriented.  height is 5\' 5"  (1.651 m) and weight is 154 lb 9.6 oz (70.126 kg). Her temperature is 98 F (36.7 C). Her blood pressure is 125/77 and her pulse is 81. Marland Kitchen  No palpable supraclavicular or axillary adenopathy. Lungs are clear to auscultation. The heart has regular rhythm and rate. Examination of the left breast reveals no palpable mass or  nipple discharge. Careful examination of the axillary region reveals no significant swelling or lymph nodes.  She has some thickening noted in the deep left axillary area with her arms above her head but not noticeable when by her side. Examination of the right breast reveals hyperpigmentation changes from her previous radiation therapy. The right breast is slightly more firm than the left. No dominant mass within the breast nipple discharge or bleeding.   Lab Findings: Lab Results  Component Value Date   WBC 9.4 05/17/2015   HGB 13.9 05/17/2015   HCT 42.3 05/17/2015   MCV 88.6 05/17/2015   PLT 220 05/17/2015    Radiographic Findings: No results found.  Impression:  No evidence of disease recurrence on clinical exam today.   Plan:  Routine follow-up in 6 months.  She will follow-up with survivorship later this summer.  -----------------------------------  Blair Promise, PhD, MD  This document serves as a record of services personally performed by Gery Pray, MD. It was created on his behalf by Derek Mound, a trained medical scribe. The creation of this record is based on the scribe's personal observations and the provider's statements to them. This document has been checked and approved by the attending provider.

## 2015-09-13 ENCOUNTER — Other Ambulatory Visit: Payer: Self-pay

## 2015-09-13 DIAGNOSIS — C50419 Malignant neoplasm of upper-outer quadrant of unspecified female breast: Secondary | ICD-10-CM

## 2015-09-14 ENCOUNTER — Other Ambulatory Visit (HOSPITAL_BASED_OUTPATIENT_CLINIC_OR_DEPARTMENT_OTHER): Payer: BLUE CROSS/BLUE SHIELD

## 2015-09-14 ENCOUNTER — Telehealth: Payer: Self-pay | Admitting: Nurse Practitioner

## 2015-09-14 ENCOUNTER — Ambulatory Visit (HOSPITAL_BASED_OUTPATIENT_CLINIC_OR_DEPARTMENT_OTHER): Payer: BLUE CROSS/BLUE SHIELD | Admitting: Nurse Practitioner

## 2015-09-14 ENCOUNTER — Encounter: Payer: Self-pay | Admitting: Nurse Practitioner

## 2015-09-14 VITALS — BP 136/92 | HR 77 | Temp 98.0°F | Resp 18 | Ht 65.0 in | Wt 152.7 lb

## 2015-09-14 DIAGNOSIS — C50411 Malignant neoplasm of upper-outer quadrant of right female breast: Secondary | ICD-10-CM | POA: Diagnosis not present

## 2015-09-14 DIAGNOSIS — C50419 Malignant neoplasm of upper-outer quadrant of unspecified female breast: Secondary | ICD-10-CM

## 2015-09-14 LAB — CBC WITH DIFFERENTIAL/PLATELET
BASO%: 0.3 % (ref 0.0–2.0)
BASOS ABS: 0 10*3/uL (ref 0.0–0.1)
EOS ABS: 0 10*3/uL (ref 0.0–0.5)
EOS%: 0.6 % (ref 0.0–7.0)
HCT: 40.8 % (ref 34.8–46.6)
HGB: 13.7 g/dL (ref 11.6–15.9)
LYMPH%: 42 % (ref 14.0–49.7)
MCH: 29.7 pg (ref 25.1–34.0)
MCHC: 33.6 g/dL (ref 31.5–36.0)
MCV: 88.3 fL (ref 79.5–101.0)
MONO#: 0.6 10*3/uL (ref 0.1–0.9)
MONO%: 9.1 % (ref 0.0–14.0)
NEUT#: 3.4 10*3/uL (ref 1.5–6.5)
NEUT%: 48 % (ref 38.4–76.8)
Platelets: 204 10*3/uL (ref 145–400)
RBC: 4.62 10*6/uL (ref 3.70–5.45)
RDW: 13.3 % (ref 11.2–14.5)
WBC: 7 10*3/uL (ref 3.9–10.3)
lymph#: 3 10*3/uL (ref 0.9–3.3)

## 2015-09-14 LAB — COMPREHENSIVE METABOLIC PANEL
ALK PHOS: 88 U/L (ref 40–150)
ALT: 15 U/L (ref 0–55)
AST: 11 U/L (ref 5–34)
Albumin: 4.1 g/dL (ref 3.5–5.0)
Anion Gap: 9 mEq/L (ref 3–11)
BUN: 15.5 mg/dL (ref 7.0–26.0)
CHLORIDE: 98 meq/L (ref 98–109)
CO2: 30 meq/L — AB (ref 22–29)
Calcium: 9.8 mg/dL (ref 8.4–10.4)
Creatinine: 0.7 mg/dL (ref 0.6–1.1)
GLUCOSE: 92 mg/dL (ref 70–140)
POTASSIUM: 3.6 meq/L (ref 3.5–5.1)
SODIUM: 137 meq/L (ref 136–145)
Total Bilirubin: 0.39 mg/dL (ref 0.20–1.20)
Total Protein: 7.3 g/dL (ref 6.4–8.3)

## 2015-09-14 NOTE — Progress Notes (Signed)
ID: Brooke Wood OB: 1965-06-26  MR#: [id number]  [name]#:[id number]  PCP: Brooke Calico, MD GYN:   SU:  OTHER MD:  CHIEF COMPLAINT: Right invasive tubular lobular breast cancer  CURRENT THERAPY: Tamoxifen 44m PO daily  BREAST CANCER HISTORY:  Brooke Wood a lump in the right breast in early November 2013. [facility] and these were performed on 03/04/2012. The mass measured 2.8cm mammographically. The ultrasound showed a spiculated mass in the 11 o'clock location of the right breast, 8 cm from the nipple measuring 2.9 cm. The right axilla showed no adenopathy.   An ultrasound guided core biopsy of the right breast on 03/04/2012 revealed invasive ductal carcinoma, grade 2, ER positive, PR positive, HER-2/neu negative, Ki-67 20%. An MRI performed on 03/10/2012 showed the mass to be 2.7cm and irregular. No abnormalities were found in the left breast, or either axilla. Her case was discussed at the multidisciplinary breast clinic on 03/11/2012 and referred for genetic counseling and testing.   A right breast lumpectomy with sentinel lymph node biopsy was performed on 04/09/2012. 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 One sentinel node had isolated tumor cells. All margins clean.   The patient had an Oncotype DX performed. Her score was 10, giving her a 7% risk of distance recurrence with tamoxifen for 5 year. She was referred to radiation oncology. Her subsequent history is as follows.   INTERVAL HISTORY: [place] follow up of her breast cancer. She continues on tamoxifen and tolerates this well with few side effects. She has occasional hot flashes, but denies vaginal changes. Her period is irregular. Her last one was about 4 months ago.   REVIEW OF SYSTEMS: Brooke Wood full time at [employer] She is on  her feet all day, so her ankles swell in the evenings. They reduce with elevation. She does small workouts and stretches at home. She makes a good effort to eat healthy. She has very mild lymphedema to the right arm. She does not wear her sleeve any more. A detailed review of systems is otherwise entirely negative.   PAST MEDICAL HISTORY: Past Medical History  Diagnosis Date  . Allergy     Neg RAST 2007  . Hypertension   . Vocal cord dysfunction     Neg Methacholine challenge test 2007  . [place]     [place] breast  . Hx of radiation therapy 06/15/12 - 07/29/12    right rbeast, 50.4 Gy x 28 fx, boost to cumulative dose 60.4 gray  . Hx of adenomatous colonic polyps 03/03/2014   PAST SURGICAL HISTORY: Past Surgical History  Procedure Laterality Date  . Breast surgery  1985    rt br bx  . Breast lumpectomy with sentinel lymph node biopsy  04/09/2012    Procedure: BREAST LUMPECTOMY WITH SENTINEL LYMPH NODE BX;  Surgeon: PMerrie Roof MD;  [place] [facility]  Service: General;  Laterality: Right;   FAMILY HISTORY Family History  Problem Relation Age of Onset  . Hypertension Other   . Alcohol abuse Neg Hx   . Diabetes Neg Hx   . Early death Neg Hx   . Hearing loss Neg Hx   . Heart disease Neg Hx   . Hyperlipidemia Neg Hx   . Stroke Neg Hx   . Kidney disease  Neg Hx   . Colon cancer Neg Hx   . Breast cancer Neg Hx   . Lung cancer Father 73    smoker  . Cancer Father     lung  Her mother is still alive in her 76s and is in good health. Her father passed away when she was a teenager from lung cancer. Her 3 brothers are younger, and have no significant health history that she can recall.   GYNECOLOGIC HISTORY:  Menarche occurred at age 33. The patient is GX P1, giving birth to her son about age 73-25. She is still menstruating. She was on birth control pills for 10+ years.   SOCIAL HISTORY: Brooke Wood lives at home alone. 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 Her son, Brooke Wood is 37. He graduated with his masters degree in sports education and Mudlogger from [employer] in May 2015. Her mother, Brooke Wood, lives 5 minutes away and is supportive. 2 of her brothers reside in [place], the other in [place]. She was married to her son's father for 9-10 years, but they got divorced in her 56s. She does not currently have a significant other. She is a Psychologist, forensic but "still needs to find a good church home."    ADVANCED DIRECTIVES: Not in place at this time. She intends to name her son, Brooke Wood, as her healthcare power of attorney. "Brooke Wood" can be reached at [phone].   HEALTH MAINTENANCE: Social History  Substance Use Topics  . Smoking status: Never Smoker   . Smokeless tobacco: Never Used  . Alcohol Use: No    Colonoscopy: "needs to schedule when insurance obtained" Bone Density Scan: never Pap Smear: UTD/2013 Lipid Panel:    Allergies  Allergen Reactions  . Aspirin Hives  . Codeine Other (See Comments)    "confusion"  . Codeine Phosphate     REACTION: unspecified    Current Outpatient Prescriptions  Medication Sig Dispense Refill  . cetirizine (ZYRTEC) 10 MG tablet Take 1 tablet (10 mg total) by mouth daily. Reported on 04/04/2015 90 tablet 1  . potassium chloride SA (K-DUR,KLOR-CON) 20 MEQ tablet TAKE 1 TABLET BY MOUTH THREE TIMES A DAY (Patient taking differently: TAKE 1 TABLET BY MOUTH DAILY) 90 tablet 5  . tamoxifen (NOLVADEX) 20 MG tablet TAKE 1 TABLET BY MOUTH ONCE DAILY 90 tablet 3  . valsartan-hydrochlorothiazide (DIOVAN-HCT) 160-25 MG per tablet TAKE 1 TABLET BY MOUTH ONCE DAILY 90 tablet 3   No current facility-administered medications for this visit.    OBJECTIVE: Filed Vitals:   09/14/15 1335 09/14/15 1348  BP: 150/96 136/92  Pulse: 77   Temp: 98 F (36.7 C)   Resp: 18       Body mass index is 25.41 kg/(m^2).   ROS   Physical Exam`  Skin: warm, dry  HEENT: sclerae anicteric, conjunctivae pink, oropharynx clear. No thrush or mucositis.  Lymph Nodes: No cervical or supraclavicular lymphadenopathy  Lungs: clear to auscultation bilaterally, no rales, wheezes, or rhonci  Heart: regular rate and rhythm  Abdomen: round, soft, non tender, positive bowel sounds  Musculoskeletal: No focal spinal tenderness, minimal right upper extremity lymphedema   Neuro: non focal, well oriented, positive affect  Breasts: right breast status post lumpectomy and radiation.  No evidence of recurrent disease. Right axilla benign. Left breast unremarkable.  ECOG FS:0 - Asymptomatic  LAB RESULTS: Lab Results  Component Value Date   WBC 7.0 09/14/2015   HGB 13.7 09/14/2015   HCT 40.8 09/14/2015   MCV 88.3 09/14/2015   PLT 204 09/14/2015     Chemistry      Component Value Date/Time   NA 137 09/14/2015 1312   NA 137 04/19/2015 0838   K 3.6 09/14/2015 1312   K 3.9 04/19/2015 0838   CL 102 04/19/2015 0838   CL 98 07/24/2012 1248   CO2 30* 09/14/2015 1312   CO2 26 04/19/2015 0838   BUN 15.5 09/14/2015 1312   BUN 16 04/19/2015 0838   CREATININE 0.7 09/14/2015 1312   CREATININE 0.71 04/19/2015 0838      Component Value Date/Time   CALCIUM 9.8 09/14/2015 1312   CALCIUM 10.0 04/19/2015 0838   ALKPHOS 88 09/14/2015 1312   ALKPHOS 84 04/19/2015 0838   AST 11 09/14/2015 1312   AST 8 04/19/2015 0838   ALT 15 09/14/2015 1312   ALT 9 04/19/2015 0838   BILITOT 0.39 09/14/2015 1312   BILITOT 0.4 04/19/2015 0838      Urinalysis No results found for: COLORURINE  STUDIES: Most recent mammogram on 05/01/15 was unremarkable.   ASSESSMENT:  50 y.o. [place] woman   (1) status post right breast biopsy on 03/11/2012 for a clinical invasive ductal carcinoma with tubular features, grade 2, ER positive, PR positive, HER-2/neu negative, Ki-67 20%  (2) status post  definitive right lumpectomy and sentinel lymph node biopsy on 04/09/2012. This revealed a 2.8 cm invasive tubular lobular carcinoma,T2 N0i, stage IIA, grade 2, ER 99%, PR 4%, HER-2/neu negative, Ki-67 60%. One sentinel node had isolated tumor cells. All margins clean.   (3) Oncotype DX performed. Her score was 10, giving her a 7% risk of distance recurrence with tamoxifen for 5 year.   (4) She completed radiation therapy in April 2015  (5) She began tamoxifen in May 2014 with a plan to continue therapy for 10 years  PLAN:  Brooke Wood looks and feels well today. The labs were reviewed in detail and were stable. She is tolerating the tamoxifen well and will continue this drug for 10 years of antiestrogen therapy.   She is due for a repeat mammogram in January 2018. We will initiate annual follow up visits starting in February. She understands and agrees with this plan. She knows the goal of treatment in her case is cure. She has been encouraged to call with any issues that might arise before her next visit here.   Laurie Panda, NP 09/14/2015 2:38 PM

## 2015-09-14 NOTE — Telephone Encounter (Signed)
appt made and avs printed °

## 2015-09-21 ENCOUNTER — Other Ambulatory Visit: Payer: Self-pay | Admitting: *Deleted

## 2015-09-21 DIAGNOSIS — C50411 Malignant neoplasm of upper-outer quadrant of right female breast: Secondary | ICD-10-CM

## 2015-09-27 ENCOUNTER — Encounter: Payer: Self-pay | Admitting: Radiation Oncology

## 2015-09-27 ENCOUNTER — Ambulatory Visit
Admission: RE | Admit: 2015-09-27 | Discharge: 2015-09-27 | Disposition: A | Discharge status: Home / Self Care | Payer: BLUE CROSS/BLUE SHIELD | Source: Ambulatory Visit | Attending: Radiation Oncology | Admitting: Radiation Oncology

## 2015-09-27 VITALS — BP 136/84 | HR 79 | Temp 97.8°F | Ht 65.0 in | Wt 153.1 lb

## 2015-09-27 DIAGNOSIS — C50411 Malignant neoplasm of upper-outer quadrant of right female breast: Secondary | ICD-10-CM | POA: Insufficient documentation

## 2015-09-27 NOTE — Progress Notes (Signed)
Patient given miconazole powder and was instructed to apply it to her right underarm area twice a day.  Brooke Wood verbalized agreement and understanding.

## 2015-09-27 NOTE — Progress Notes (Signed)
Cathie Beams here for follow up.  She denies having pain.  She reports having a red area under her right arm that started today.  She denies having pain or itching in this area.  She does have a red rash area under her right arm.  She has not used any new soaps or Deoderant in this area.  She is taking tamoxifen.  She reports her energy level is good.  She will have a mammogram in January.  BP 136/84 mmHg  Pulse 79  Temp(Src) 97.8 F (36.6 C) (Oral)  Ht 5\' 5"  (1.651 m)  Wt 153 lb 1.6 oz (69.446 kg)  BMI 25.48 kg/m2  SpO2 100%   Wt Readings from Last 3 Encounters:  09/27/15 153 lb 1.6 oz (69.446 kg)  09/14/15 152 lb 11.2 oz (69.264 kg)  09/07/15 154 lb 9.6 oz (70.126 kg)

## 2015-09-27 NOTE — Addendum Note (Signed)
Encounter addended by: Jacqulyn Liner, RN on: 09/27/2015  3:34 PM<BR>     Documentation filed: Charges VN

## 2015-09-27 NOTE — Progress Notes (Signed)
  Radiation Oncology         [phone] ________________________________  Name: Brooke Wood MRN: [id number]  Date: 09/27/2015  DOB: 04-19-1965  Follow-Up Visit Note  CC: [name], MD  Consuela Mimes, MD    ICD-9-CM ICD-10-CM   1. Malignant neoplasm of upper-outer quadrant of right female breast ([place]) 174.4 C50.411     Diagnosis: Stage IIA  invasive tubulo- lobular carcinoma the right breast  Interval Since Last Radiation: 3 years and 2 months  06/15/2012 through 07/29/2012: Right breast 50.4 gray in 28 fractions. The site of presentation (lumpectomy cavity) in the upper outer quadrant of the right breast was boosted to a cumulative dose of 60.4 gray   Narrative: Ms. Balas asked to be seen for a rash in her right armpit area. Most recent mammogram on 05/01/15 was negative. She denies having pain. She reports the rash under her right arm started this morning. She denies having pain or itching in this area. She has not applied any new soaps or deoderant in this area. She is taking tamoxifen. She reports her energy level is good.  ALLERGIES:  is allergic to aspirin; codeine; and codeine phosphate.  Meds: Current Outpatient Prescriptions  Medication Sig Dispense Refill  . cetirizine (ZYRTEC) 10 MG tablet Take 1 tablet (10 mg total) by mouth daily. Reported on 04/04/2015 90 tablet 1  . miconazole (MICOTIN) 2 % powder Apply topically as needed for itching.    . potassium chloride SA (K-DUR,KLOR-CON) 20 MEQ tablet TAKE 1 TABLET BY MOUTH THREE TIMES A DAY (Patient taking differently: TAKE 1 TABLET BY MOUTH DAILY) 90 tablet 5  . tamoxifen (NOLVADEX) 20 MG tablet TAKE 1 TABLET BY MOUTH ONCE DAILY 90 tablet 3  . valsartan-hydrochlorothiazide (DIOVAN-HCT) 160-25 MG per tablet TAKE 1 TABLET BY MOUTH ONCE DAILY 90 tablet 3   No current facility-administered medications for this encounter.    Physical Findings: The patient is in no acute distress. Patient is alert and oriented.  height is 5'  5" (1.651 m) and weight is 153 lb 1.6 oz (69.446 kg). Her oral temperature is 97.8 F (36.6 C). Her blood pressure is 136/84 and her pulse is 79. Her oxygen saturation is 100%.   Lungs are clear to auscultation bilaterally. Heart has regular rate and rhythm. No palpable cervical, supraclavicular, or axillary adenopathy. No signs of recurrence in right breast.  Macular erythematous rash along the right armpit area.  Lab Findings: Lab Results  Component Value Date   WBC 7.0 09/14/2015   HGB 13.7 09/14/2015   HCT 40.8 09/14/2015   MCV 88.3 09/14/2015   PLT 204 09/14/2015    Radiographic Findings: No results found.  Impression:  No evidence of disease recurrence on clinical exam today. The rash may possibly be a yeast infection.  Plan: The patient was given an antifungal powder to apply under her right arm. She will keep her follow up scheduled in December. She will call if the rash does not improve.  -----------------------------------  Blair Promise, PhD, MD  This document serves as a record of services personally performed by Gery Pray, MD. It was created on his behalf by Darcus Austin, a trained medical scribe. The creation of this record is based on the scribe's personal observations and the provider's statements to them. This document has been checked and approved by the attending provider.

## 2015-09-30 ENCOUNTER — Encounter (HOSPITAL_COMMUNITY): Payer: Self-pay | Admitting: *Deleted

## 2015-09-30 ENCOUNTER — Emergency Department (HOSPITAL_COMMUNITY): Payer: BLUE CROSS/BLUE SHIELD

## 2015-09-30 ENCOUNTER — Emergency Department (HOSPITAL_COMMUNITY)
Admission: EM | Admit: 2015-09-30 | Discharge: 2015-09-30 | Disposition: A | Discharge status: Home / Self Care | Payer: BLUE CROSS/BLUE SHIELD | Attending: Emergency Medicine | Admitting: Emergency Medicine

## 2015-09-30 DIAGNOSIS — Y9389 Activity, other specified: Secondary | ICD-10-CM | POA: Insufficient documentation

## 2015-09-30 DIAGNOSIS — M7072 Other bursitis of hip, left hip: Secondary | ICD-10-CM | POA: Insufficient documentation

## 2015-09-30 DIAGNOSIS — M25552 Pain in left hip: Secondary | ICD-10-CM | POA: Diagnosis not present

## 2015-09-30 DIAGNOSIS — I10 Essential (primary) hypertension: Secondary | ICD-10-CM | POA: Diagnosis not present

## 2015-09-30 DIAGNOSIS — Z853 Personal history of malignant neoplasm of breast: Secondary | ICD-10-CM | POA: Insufficient documentation

## 2015-09-30 MED ORDER — ACETAMINOPHEN 325 MG PO TABS
650.0000 mg | ORAL_TABLET | Freq: Once | ORAL | Status: AC
  Administered 2015-09-30: 650 mg via ORAL
  Filled 2015-09-30: qty 2

## 2015-09-30 MED ORDER — IBUPROFEN 400 MG PO TABS
400.0000 mg | ORAL_TABLET | Freq: Once | ORAL | Status: AC
  Administered 2015-09-30: 400 mg via ORAL
  Filled 2015-09-30: qty 1

## 2015-09-30 NOTE — ED Notes (Addendum)
Pt presents via POV c/o left hip pain beginning yesterday.  Denies injury to hip.  Reports hx bursitis.  A x 4, yelling in pain.   Pt stating "I just want an xray and go home to rest".

## 2015-09-30 NOTE — ED Notes (Signed)
Patient transported to X-ray 

## 2015-09-30 NOTE — ED Provider Notes (Signed)
CSN: [id number]     Arrival date & time 09/30/15  [id number] History   First MD Initiated Contact with Patient 09/30/15 1004     Chief Complaint  Patient presents with  . Hip Pain   (Consider location/radiation/quality/duration/timing/severity/associated sxs/prior Treatment) HPI 50 y.o. female presents to the Emergency Department today complaining of left hip pain with onset yesterday. 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 Has hx of similar in the past with Bursitis. Notes no trauma to the area. No fall. Notes that putting weight on leg makes pain worse. No pain with rest. Pain 10/10 with weight. Has not tried any OTC remedies. No fevers. No N/V/D. No CP/SOB/ABD pain, No numbness/tingling. No other symptoms noted..   Past Medical History  Diagnosis Date  . Allergy     Neg RAST 2007  . Hypertension   . Vocal cord dysfunction     Neg Methacholine challenge test 2007  . Breast cancer ([place])     right breast  . Hx of radiation therapy 06/15/12 - 07/29/12    right rbeast, 50.4 Gy x 28 fx, boost to cumulative dose 60.4 gray  . Hx of adenomatous colonic polyps 03/03/2014   Past Surgical History  Procedure Laterality Date  . Breast surgery  1985    rt br bx  . Breast lumpectomy with sentinel lymph node biopsy  04/09/2012    Procedure: BREAST LUMPECTOMY WITH SENTINEL LYMPH NODE BX;  Surgeon: Merrie Roof, MD;  Location: [facility];  Service: General;  Laterality: Right;   Family History  Problem Relation Age of Onset  . Hypertension Other   . Alcohol abuse Neg Hx   . Diabetes Neg Hx   . Early death Neg Hx   . Hearing loss Neg Hx   . Heart disease Neg Hx   . Hyperlipidemia Neg Hx   . Stroke Neg Hx   . Kidney disease Neg Hx   . Colon cancer Neg Hx   . Breast cancer Neg Hx   . Lung cancer Father 35    smoker  . Cancer Father     lung   Social History  Substance Use Topics  . Smoking status: Never Smoker   . Smokeless tobacco: Never Used   . Alcohol Use: No   OB History    Gravida Para Term Preterm AB TAB SAB Ectopic Multiple Living   1 1 1       1      Review of Systems ROS reviewed and all are negative for acute change except as noted in the HPI.  Allergies  Aspirin; Codeine; and Codeine phosphate  Home Medications   Prior to Admission medications   Medication Sig Start Date End Date Taking? Authorizing Provider  cetirizine (ZYRTEC) 10 MG tablet Take 1 tablet (10 mg total) by mouth daily. Reported on 04/04/2015 07/18/15   [name], MD  miconazole (MICOTIN) 2 % powder Apply topically as needed for itching.    Historical Provider, MD  potassium chloride SA (K-DUR,KLOR-CON) 20 MEQ tablet TAKE 1 TABLET BY MOUTH THREE TIMES A DAY Patient taking differently: TAKE 1 TABLET BY MOUTH DAILY 10/27/14   [name], MD  tamoxifen (NOLVADEX) 20 MG tablet TAKE 1 TABLET BY MOUTH ONCE DAILY 07/11/15   Laurie Panda, NP  valsartan-hydrochlorothiazide (DIOVAN-HCT) 160-25 MG per tablet TAKE 1 TABLET BY MOUTH ONCE DAILY 11/14/14   [name], MD   BP 142/81 mmHg  Pulse 65  Temp(Src) 97.7 F (36.5 C) (Oral)  Resp 20  SpO2 100%   Physical Exam  Constitutional: She is oriented to person, place, and time. She appears well-developed and well-nourished.  HENT:  Head: Normocephalic and atraumatic.  Eyes: EOM are normal. Pupils are equal, round, and reactive to light.  Neck: Normal range of motion. Neck supple. No tracheal deviation present.  Cardiovascular: Normal rate and regular rhythm.   Pulmonary/Chest: Effort normal.  Abdominal: Soft.  Musculoskeletal: Normal range of motion.       Left hip: Normal. She exhibits normal range of motion, normal strength, no tenderness, no bony tenderness, no swelling, no crepitus, no deformity and no laceration.  Hip Joint intact. ROM intact. TTP along lateral aspect. Weight bearing exhibits pain. Relief with stretching exercises in external and internal rotation. neurovascularly  intact.   Neurological: She is alert and oriented to person, place, and time.  Skin: Skin is warm and dry.  Psychiatric: She has a normal mood and affect. Her behavior is normal. Thought content normal.  Nursing note and vitals reviewed.  ED Course  Procedures (including critical care time) Labs Review Labs Reviewed - No data to display  Imaging Review Dg Hip Unilat With Pelvis 2-3 Views Left  09/30/2015  CLINICAL DATA:  Chronic left hip pain, no known injury, initial encounter EXAM: DG HIP (WITH OR WITHOUT PELVIS) 2-3V LEFT COMPARISON:  None. FINDINGS: There is no evidence of hip fracture or dislocation. There is no evidence of arthropathy or other focal bone abnormality. IMPRESSION: No acute abnormality noted. Electronically Signed   By: Inez Catalina M.D.   On: 09/30/2015 10:32   I have personally reviewed and evaluated these images and lab results as part of my medical decision-making.   EKG Interpretation None      MDM  I have reviewed and evaluated the relevant imaging studies.  I have reviewed the relevant previous healthcare records. I obtained HPI from historian. Patient discussed with supervising physician  ED Course:  Assessment: Pt is a 50yFwho presents with right hip pain since yesterday. No trauma/fall. On exam, pt in NAD. Nontoxic/nonseptic appearing. VSS. Afebrile. Left hip with ROM intact. TTP along lateral aspect along IT band. Possible Bursitis vs IT band. Imaging unremarkable for fracture. Given Ibuprofen/Tylenol in ED. Will start with trial of NSAIDs. Plan is to DC home with follow up to Orthopedics. At time of discharge, Patient is in no acute distress. Vital Signs are stable. Patient is able to ambulate. Patient able to tolerate PO.    Disposition/Plan:  DC Home Additional Verbal discharge instructions given and discussed with patient.  Pt Instructed to f/u with Ortho in the next week for evaluation and treatment of symptoms. Return precautions given Pt  acknowledges and agrees with plan  Supervising Physician Alfonzo Beers, MD   Final diagnoses:  Hip bursitis, left     Shary Decamp, PA-C 09/30/15 [place], MD 09/30/15 1055

## 2015-09-30 NOTE — Discharge Instructions (Signed)
Please read and follow all provided instructions.  Your diagnoses today include:  1. Hip bursitis, left     Tests performed today include:  Vital signs. See below for your results today.   Medications prescribed:   Take as prescribed   Home care instructions:  Follow any educational materials contained in this packet.  Follow-up instructions: Please follow-up with Orthopedics for further evaluation of symptoms and treatment   Return instructions:   Please return to the Emergency Department if you do not get better, if you get worse, or new symptoms OR  - Fever (temperature greater than 101.78F)  - Bleeding that does not stop with holding pressure to the area    -Severe pain (please note that you may be more sore the day after your accident)  - Chest Pain  - Difficulty breathing  - Severe nausea or vomiting  - Inability to tolerate food and liquids  - Passing out  - Skin becoming red around your wounds  - Change in mental status (confusion or lethargy)  - New numbness or weakness     Please return if you have any other emergent concerns.  Additional Information:  Your vital signs today were: BP 142/81 mmHg   Pulse 65   Temp(Src) 97.7 F (36.5 C) (Oral)   Resp 20   SpO2 100% If your blood pressure (BP) was elevated above 135/85 this visit, please have this repeated by your doctor within one month. ---------------

## 2015-09-30 NOTE — ED Notes (Signed)
Placed patient into a gown and on the monitor 

## 2015-10-04 MED FILL — KLOR-CON M20 TABLET: 20 | 30 days supply | Qty: 90 | Fill #4

## 2015-10-04 MED FILL — VALSARTAN-HCTZ 160-25 MG TA: 160-25 | 90 days supply | Qty: 90 | Fill #9

## 2015-10-04 MED FILL — TAMOXIFEN 20 MG TABLET: 20 | 30 days supply | Qty: 30 | Fill #1

## 2015-10-12 DIAGNOSIS — M7062 Trochanteric bursitis, left hip: Secondary | ICD-10-CM | POA: Diagnosis not present

## 2015-10-12 MED FILL — MELOXICAM 15 MG TABLET: 15 | 30 days supply | Qty: 30 | Fill #0

## 2015-10-12 MED FILL — traMADol HCL 50 MG TABS: 50 | 10 days supply | Qty: 60 | Fill #0

## 2015-10-20 DIAGNOSIS — C50411 Malignant neoplasm of upper-outer quadrant of right female breast: Secondary | ICD-10-CM | POA: Diagnosis not present

## 2015-10-25 DIAGNOSIS — M7062 Trochanteric bursitis, left hip: Secondary | ICD-10-CM | POA: Diagnosis not present

## 2015-10-26 MED FILL — ETODOLAC 400 MG TABLET: 400 | 30 days supply | Qty: 60 | Fill #0

## 2015-10-26 MED FILL — METHYLPREDNISOLONE 4 MG TAB: 4 | 6 days supply | Qty: 21 | Fill #0

## 2015-11-02 ENCOUNTER — Ambulatory Visit
Admission: RE | Admit: 2015-11-02 | Discharge: 2015-11-02 | Disposition: A | Discharge status: Home / Self Care | Payer: BLUE CROSS/BLUE SHIELD | Source: Ambulatory Visit | Attending: Radiation Oncology | Admitting: Radiation Oncology

## 2015-11-02 ENCOUNTER — Encounter: Payer: Self-pay | Admitting: Radiation Oncology

## 2015-11-02 DIAGNOSIS — C50411 Malignant neoplasm of upper-outer quadrant of right female breast: Secondary | ICD-10-CM | POA: Diagnosis not present

## 2015-11-02 DIAGNOSIS — M25552 Pain in left hip: Secondary | ICD-10-CM | POA: Diagnosis not present

## 2015-11-02 LAB — BUN AND CREATININE (CC13)
BUN: 19.4 mg/dL (ref 7.0–26.0)
Creatinine: 0.7 mg/dL (ref 0.6–1.1)
EGFR: >90 mL/min/{1.73_m2} (ref >90)

## 2015-11-02 MED FILL — TAMOXIFEN 20 MG TABLET: 20 | 30 days supply | Qty: 30 | Fill #2

## 2015-11-02 NOTE — Progress Notes (Signed)
Brooke Wood here for follow up.  She developed pain that comes and goes in her left hip at the end of June.  She went to the ER on 09/30/15 and was told it was from bursitis.  She saw an Dr. Rush Farmer who gave her steroid shots on 10/12/15.  She reports the pain returned two weeks after she received the injection.  She reports the pain is worse after standing at work.  She picked up a medrol dosepak today but has not started it yet.  She continues to take tamoxifen.    BP 128/82 (BP Location: Left Arm, Patient Position: Sitting)   Pulse 83   Temp 98.1 F (36.7 C) (Oral)   Ht 5\' 5"  (1.651 m)   Wt 150 lb 3.2 oz (68.1 kg)   SpO2 100%   BMI 24.99 kg/m    Wt Readings from Last 3 Encounters:  11/02/15 150 lb 3.2 oz (68.1 kg)  09/27/15 153 lb 1.6 oz (69.4 kg)  09/14/15 152 lb 11.2 oz (69.3 kg)

## 2015-11-02 NOTE — Progress Notes (Signed)
Radiation Oncology         [phone]) [phone] ________________________________  Name: Brooke Wood MRN: [id number]  Date: 11/02/2015  DOB: October 06, 1965  Follow-Up Visit Note  CC: [name], MD  [name], MD    ICD-9-CM ICD-10-CM   1. 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 The site of presentation (lumpectomy cavity) in the upper outer quadrant of the right breast was boosted to a cumulative dose of 60.4 gray   Narrative: Ms. Souffront developed pain that comes and goes in her left hip at the end of June.  She went to the ER on 09/30/15 and was told it was from bursitis. X -ray at that time showed no evidence of hip fracture or dislocation. She saw Dr. Ninfa Linden (orthopedic) who gave her steroid shots on 10/12/15.  She reports the pain returned two weeks after she received the injection.  She reports the pain is worse after standing at work.  She picked up a medrol dosepak today, but has not started it yet.  She continues to take tamoxifen.  ALLERGIES:  is allergic to aspirin; codeine; and codeine phosphate.  Meds: Current Outpatient Prescriptions  Medication Sig Dispense Refill  . cetirizine (ZYRTEC) 10 MG tablet Take 1 tablet (10 mg total) by mouth daily. Reported on 04/04/2015 90 tablet 1  . miconazole (MICOTIN) 2 % powder Apply topically as needed for itching.    . potassium chloride SA (K-DUR,KLOR-CON) 20 MEQ tablet TAKE 1 TABLET BY MOUTH THREE TIMES A DAY (Patient taking differently: TAKE 1 TABLET BY MOUTH DAILY) 90 tablet 5  . tamoxifen (NOLVADEX) 20 MG tablet TAKE 1 TABLET BY MOUTH ONCE DAILY 90 tablet 3  . valsartan-hydrochlorothiazide (DIOVAN-HCT) 160-25 MG per tablet TAKE 1 TABLET BY  MOUTH ONCE DAILY 90 tablet 3  . etodolac (LODINE) 400 MG tablet   0  . meloxicam (MOBIC) 15 MG tablet   3  . methylPREDNISolone (MEDROL DOSEPAK) 4 MG TBPK tablet   0   No current facility-administered medications for this encounter.     Physical Findings: The patient is in no acute distress. Patient is alert and oriented.  height is 5\' 5"  (1.651 m) and weight is 150 lb 3.2 oz (68.1 kg). Her oral temperature is 98.1 F (36.7 C). Her blood pressure is 128/82 and her pulse is 83. Her oxygen saturation is 100%.   Lungs are clear to auscultation bilaterally. Heart has regular rate and rhythm. No palpable cervical, supraclavicular, or axillary adenopathy.  Tenderness to palpation along the greater trochanter of the left lower extremity. Neurological exam: Good motor strength in proximal and distal portions of the lower extremities.  Lab Findings: Lab Results  Component Value Date   WBC 7.0 09/14/2015   HGB 13.7 09/14/2015   HCT 40.8 09/14/2015   MCV 88.3 09/14/2015   PLT 204 09/14/2015    Radiographic Findings: No results found.  Impression: A breast exam was deferred during this encounter.  Plan: The patient will be sent to the lab for blood work after this encounter. I would like for her to undergo a CT scan with contrast next week For further evaluation and follow up with radiation oncology after the scan. -----------------------------------  Blair Promise, PhD, MD  This document serves as a record of services personally performed by Gery Pray, MD. It was created on his behalf by Darcus Austin, a trained medical scribe. The creation of this record is based on the scribe's personal observations and the provider's statements to them. This document has been checked and approved by the attending provider.

## 2015-11-06 ENCOUNTER — Telehealth: Payer: Self-pay | Admitting: *Deleted

## 2015-11-06 NOTE — Telephone Encounter (Signed)
CALLED PATIENT TO INFORM OF CT FOR 11-09-15- ARRIVAL TIME - 2:45 PM @ [facility], NPO 4 HRS. PRIOR TO TEST AND HER FU WITH DR. KINARD ON 11-14-15 @ 4 PM, SPOKE WITH PATIENT AND SHE IS AWARE OF THESE APPTS.

## 2015-11-09 ENCOUNTER — Other Ambulatory Visit: Payer: Self-pay | Admitting: Radiation Oncology

## 2015-11-09 ENCOUNTER — Telehealth: Payer: Self-pay | Admitting: Oncology

## 2015-11-09 ENCOUNTER — Ambulatory Visit (HOSPITAL_COMMUNITY)
Admission: RE | Admit: 2015-11-09 | Discharge: 2015-11-09 | Disposition: A | Discharge status: Home / Self Care | Payer: BLUE CROSS/BLUE SHIELD | Source: Ambulatory Visit | Attending: Radiation Oncology | Admitting: Radiation Oncology

## 2015-11-09 ENCOUNTER — Encounter (HOSPITAL_COMMUNITY): Payer: Self-pay

## 2015-11-09 DIAGNOSIS — C50411 Malignant neoplasm of upper-outer quadrant of right female breast: Secondary | ICD-10-CM

## 2015-11-09 DIAGNOSIS — M25552 Pain in left hip: Secondary | ICD-10-CM | POA: Diagnosis not present

## 2015-11-09 DIAGNOSIS — E279 Disorder of adrenal gland, unspecified: Secondary | ICD-10-CM | POA: Diagnosis not present

## 2015-11-09 DIAGNOSIS — M5126 Other intervertebral disc displacement, lumbar region: Secondary | ICD-10-CM | POA: Diagnosis not present

## 2015-11-09 MED ORDER — IOPAMIDOL (ISOVUE-300) INJECTION 61%
100.0000 mL | Freq: Once | INTRAVENOUS | Status: AC | PRN
  Administered 2015-11-09: 100 mL via INTRAVENOUS

## 2015-11-09 NOTE — Telephone Encounter (Signed)
Thayer Headings from [facility] called and asking if the order for the CT Abdomen/pelvis could be changed from W and W/O contrast to just with contrast.  Called her back and notified her to proceed with contrast per Dr. Sondra Come.

## 2015-11-14 ENCOUNTER — Ambulatory Visit
Admission: RE | Admit: 2015-11-14 | Discharge: 2015-11-14 | Disposition: A | Discharge status: Home / Self Care | Payer: BLUE CROSS/BLUE SHIELD | Source: Ambulatory Visit | Attending: Radiation Oncology | Admitting: Radiation Oncology

## 2015-11-14 ENCOUNTER — Encounter: Payer: Self-pay | Admitting: Radiation Oncology

## 2015-11-14 VITALS — BP 118/85 | HR 93 | Temp 98.3°F | Ht 65.0 in | Wt 152.0 lb

## 2015-11-14 DIAGNOSIS — C50411 Malignant neoplasm of upper-outer quadrant of right female breast: Secondary | ICD-10-CM | POA: Diagnosis not present

## 2015-11-14 DIAGNOSIS — M25552 Pain in left hip: Secondary | ICD-10-CM | POA: Diagnosis not present

## 2015-11-14 DIAGNOSIS — M5126 Other intervertebral disc displacement, lumbar region: Secondary | ICD-10-CM | POA: Diagnosis not present

## 2015-11-14 NOTE — Progress Notes (Signed)
Brooke Wood presents for follow up of radiation completed 07/29/12 to her Right Breast. She denies pain at this time. She does report occasional pain in her Left Hip which she had a CT scan recently and is here to get the results. She does also report that this morning she had some tingling in both her feet which just started today. She is eating well per her report. She has no other concerns at this time.   BP 118/85   Pulse 93   Temp 98.3 F (36.8 C)   Ht 5\' 5"  (1.651 m)   Wt 152 lb (68.9 kg)   SpO2 99% Comment: room air  BMI 25.29 kg/m    Wt Readings from Last 3 Encounters:  11/14/15 152 lb (68.9 kg)  11/02/15 150 lb 3.2 oz (68.1 kg)  09/27/15 153 lb 1.6 oz (69.4 kg)

## 2015-11-14 NOTE — Progress Notes (Signed)
Radiation Oncology         [phone] ________________________________  Name: Brooke Wood MRN: [id number]  Date: 11/14/2015  DOB: 30-Mar-1966  Follow-Up Visit Note  CC: [name], MD  Marcy Panning, MD    ICD-9-CM ICD-10-CM   1. 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 The site of presentation (lumpectomy cavity) in the upper outer quadrant of the right breast was boosted to a cumulative dose of 60.4 gray   Narrative: Ms. Brooke Wood developed pain that comes and goes in her left hip at the end of June.  She went to the ER on 09/30/15 and was told it was from bursitis. X -ray at that time showed no evidence of hip fracture or dislocation. She saw Dr. Ninfa Linden (orthopedic) who gave her steroid shots on 10/12/15.  She reports the pain returned two weeks after she received the injection. She continues to take tamoxifen.  She reports occasional left hip pain continues. She also reports tingling in both feet that just started today. CT scan on 11/09/15 revealed a large left-sided disc extrusion at L4-5 that is likely contributing to her left hip pain. There is no evidence of metastasis.  ALLERGIES:  is allergic to aspirin; codeine; and codeine phosphate.  Meds: Current Outpatient Prescriptions  Medication Sig Dispense Refill  . cetirizine (ZYRTEC) 10 MG tablet Take 1 tablet (10 mg total) by mouth daily. Reported on 04/04/2015 90 tablet 1  . miconazole (MICOTIN) 2 % powder Apply topically as needed for itching.    . potassium chloride SA (K-DUR,KLOR-CON) 20 MEQ tablet TAKE 1 TABLET BY MOUTH THREE TIMES A DAY (Patient taking differently: TAKE 1 TABLET BY MOUTH DAILY) 90 tablet 5  . tamoxifen (NOLVADEX) 20 MG tablet TAKE 1 TABLET BY MOUTH ONCE DAILY 90  tablet 3  . valsartan-hydrochlorothiazide (DIOVAN-HCT) 160-25 MG per tablet TAKE 1 TABLET BY MOUTH ONCE DAILY 90 tablet 3   No current facility-administered medications for this encounter.     Physical Findings: The patient is in no acute distress. Patient is alert and oriented.  height is 5\' 5"  (1.651 m) and weight is 152 lb (68.9 kg). Her temperature is 98.3 F (36.8 C). Her blood pressure is 118/85 and her pulse is 93. Her oxygen saturation is 99%.   Lungs are clear to auscultation bilaterally. Heart has regular rate and rhythm.  Neurological exam: Good motor strength in proximal and distal portions of the lower extremities.  Lab Findings: Lab Results  Component Value Date   WBC 7.0 09/14/2015   HGB 13.7 09/14/2015   HCT 40.8 09/14/2015   MCV 88.3 09/14/2015   PLT 204 09/14/2015    Radiographic Findings: Ct Abdomen Pelvis W Contrast  Result Date: 11/10/2015 CLINICAL DATA:  Right breast cancer diagnosed in 2014 status post right lumpectomy and radiation therapy. Oral chemotherapy ongoing. Left hip pain for 1 month. EXAM: CT ABDOMEN AND PELVIS WITH CONTRAST TECHNIQUE: Multidetector CT imaging of the abdomen and pelvis was performed using the standard protocol following bolus administration of intravenous contrast. CONTRAST:  190mL ISOVUE-300 IOPAMIDOL (ISOVUE-300) INJECTION 61% COMPARISON:  Abdominal pelvic CT 04/26/2015 FINDINGS: Lower chest: Clear lung bases. No significant pleural or pericardial effusion. Hepatobiliary: The liver is normal in density without focal abnormality. No evidence of gallstones,  gallbladder wall thickening or biliary dilatation. Pancreas: Unremarkable. No pancreatic ductal dilatation or surrounding inflammatory changes. Spleen: Normal in size without focal abnormality. Adrenals/Urinary Tract: Both adrenal glands appear normal. The kidneys appear normal without evidence of urinary tract calculus, suspicious lesion or hydronephrosis. No bladder abnormalities are  seen. Stomach/Bowel: No evidence of bowel wall thickening, distention or surrounding inflammatory change. The appendix appears normal. Vascular/Lymphatic: There are no enlarged abdominal or pelvic lymph nodes. No significant vascular findings are present. Reproductive: Retroverted uterus appear stable. There is a water density left adnexal lesion measuring 4.4 x 3.3 cm on image 74, likely a functional ovarian cyst. The right ovary appears stable. Other: No evidence of abdominal wall mass or hernia. Musculoskeletal: No acute or significant osseous findings. The left hip and bony pelvis appear unremarkable aside from stable sacral Tarlov cysts. There is a large left foraminal disc extrusion at L4-5 with probable cephalad extension of disc material. This is likely symptomatic on the basis of left L4 nerve root compression. IMPRESSION: 1. 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 This could be further evaluated with dedicated lumbar MRI as clinically warranted. 2. No evidence of metastatic breast cancer. 3. Low-density left adnexal lesion, likely a functional cyst of the left ovary. This requires no specific follow-up if the patient is premenopausal. If postmenopausal or symptomatic, consider ultrasound correlation. Electronically Signed   By: Richardean Sale M.D.   On: 11/10/2015 08:51    Impression: A breast exam was deferred during this encounter.  Plan: I will refer the patient back to orthopedics for further workup concerning her disk protrusion. I provided the patient with a copy of her most recent CT scan. -----------------------------------  Blair Promise, PhD, MD  This document serves as a record of services personally performed by Gery Pray, MD. It was created on his behalf by Darcus Austin, a trained medical scribe. The creation of this record is based on the scribe's personal observations and the provider's statements to them. This document has  been checked and approved by the attending provider.

## 2015-11-15 ENCOUNTER — Telehealth: Payer: Self-pay | Admitting: *Deleted

## 2015-11-15 NOTE — Telephone Encounter (Signed)
CALLED PATIENT TO INFORM OF APPT. WITH DR. Jean Rosenthal ON 11-27-15- ARRIVAL TIME - 2 PM, LVM  FOR A RETURN CALL

## 2015-11-27 DIAGNOSIS — M5126 Other intervertebral disc displacement, lumbar region: Secondary | ICD-10-CM | POA: Diagnosis not present

## 2015-11-27 MED FILL — METHYLPREDNISOLONE 4 MG TAB: 4 | 6 days supply | Qty: 21 | Fill #0

## 2015-11-30 ENCOUNTER — Ambulatory Visit: Payer: BLUE CROSS/BLUE SHIELD | Admitting: Physical Therapy

## 2015-12-05 MED FILL — TAMOXIFEN 20 MG TABLET: 20 | 30 days supply | Qty: 30 | Fill #3

## 2015-12-07 ENCOUNTER — Ambulatory Visit: Payer: BLUE CROSS/BLUE SHIELD | Attending: Orthopaedic Surgery | Admitting: Physical Therapy

## 2015-12-07 DIAGNOSIS — R293 Abnormal posture: Secondary | ICD-10-CM | POA: Insufficient documentation

## 2015-12-07 NOTE — Therapy (Signed)
[facility] [place], [place], [address] Phone: [phone]   Fax:  [phone]  Physical Therapy Evaluation  Patient Details  Name: Brooke Wood MRN: [id number] Date of Birth: 1965-12-15 Referring Provider: Ninfa Linden  Encounter Date: 12/07/2015      PT End of Session - 12/07/15 1759    Visit Number 1   Number of Visits 1   PT Start Time 1300   PT Stop Time 1345   PT Time Calculation (min) 45 min   Activity Tolerance Patient tolerated treatment well   Behavior During Therapy [facility] for tasks assessed/performed      Past Medical History:  Diagnosis Date  . Allergy    Neg RAST 2007  . Breast cancer ([place])    right breast  . Hx of adenomatous colonic polyps 03/03/2014  . Hx of radiation therapy 06/15/12 - 07/29/12   right rbeast, 50.4 Gy x 28 fx, boost to cumulative dose 60.4 gray  . Hypertension   . Vocal cord dysfunction    Neg Methacholine challenge test 2007    Past Surgical History:  Procedure Laterality Date  . BREAST LUMPECTOMY WITH SENTINEL LYMPH NODE BIOPSY  04/09/2012   Procedure: BREAST LUMPECTOMY WITH SENTINEL LYMPH NODE BX;  Surgeon: Merrie Roof, MD;  Location: [facility];  Service: General;  Laterality: Right;  . BREAST SURGERY  1985   rt br bx    There were no vitals filed for this visit.       Subjective Assessment - 12/07/15 1313    Subjective On July 1 she had sudden onset of left pain, went to [facility] and was told it was bursitis and got a shot in her hip from Dr. Ninfa Linden. Did not get better, so she went to see Dr. Sondra Come who did a CT scan and showed a large disc extrusion at L4-L5 .  She went back to Dr. Ninfa Linden who referred her to PT for McKenzie extension exercise and mechanical traction    Pertinent History breast cancer    Limitations Standing;Walking   How long can you sit comfortably? hours on end    How long can you stand comfortably? hip bothers her at  work...good days and bad days    How long can you walk comfortably? doesn't bother her    Diagnostic tests CT scan 11/10/2015    Patient Stated Goals "to get this hip right"    Currently in Pain? No/denies  sometimes she has pain down into calf  night  she has to lie stiff...feels muscles throbbing .  no pain today             [facility] PT Assessment - 12/07/15 0001      Assessment   Medical Diagnosis L4-5  disc extrusion    Referring Provider Ninfa Linden   Onset Date/Surgical Date 09/30/15     Precautions   Precautions Back     Restrictions   Weight Bearing Restrictions No     Balance Screen   Has the patient fallen in the past 6 months No   Has the patient had a decrease in activity level because of a fear of falling?  No   Is the patient reluctant to leave their home because of a fear of falling?  No     Home Environment   Living Environment Private residence   Living Arrangements Alone   Type of [place] to enter  Entrance Stairs-Number of Steps 3   Entrance Stairs-Rails None   Home Layout Two level   Alternate Level Stairs-Number of Steps flight    Alternate Level Stairs-Rails Left   Home Equipment None     Prior Function   Level of Independence Independent   Vocation Part time employment  25 a week    Vocation Requirements stand on her feet all day    Leisure tries to exercise at home, likes to walk      Cognition   Overall Cognitive Status Within Functional Limits for tasks assessed     Posture/Postural Control   Posture/Postural Control Postural limitations   Postural Limitations Forward head;Increased lumbar lordosis  knee hyperextension in standing,protruding abdomen      AROM   Lumbar Flexion mod limited with knee hyperextension   no pain    Lumbar Extension WFL with no increase in hip pain x 10 repetitions   no pain    Lumbar - Right Side Bend WFL   Lumbar - Left Side Bend WFL   Lumbar - Right Rotation WFL   Lumbar -  Left Rotation WFL     Strength   Right/Left Hip Right;Left   Right Hip Flexion 4+/5   Left Hip Flexion 4+/5   Right Knee Extension 5/5   Left Knee Extension 5/5   Right Ankle Dorsiflexion 5/5  great toe extension  5/5    Left Ankle Dorsiflexion 5/5  left great toe extension                    [facility] Adult PT Treatment/Exercise - 12/07/15 0001      Self-Care   Self-Care Other Self-Care Comments                PT Education - 12/07/15 1755    Education provided Yes   Education Details do prone press up to elbows if pain occurs in legs at night    Person(s) Educated Patient   Methods Explanation;Demonstration   Comprehension Verbalized understanding;Returned demonstration                    Plan - 12/07/15 1759    Clinical Impression Statement Ms. Shepheard reports she has no pain at the current time.  She appears to have a lordodic posture, but did not have pain with any movement or muscle weakness today  She does not feel she needs physical theapy at this time, but will contact us with a new referral again if symptoms return    PT Next Visit Plan No PT follow up    Consulted and Agree with Plan of Care Patient      Patient will benefit from skilled therapeutic intervention in order to improve the following deficits and impairments:     Visit Diagnosis: Abnormal posture     Problem List Patient Active Problem List   Diagnosis Date Noted  . Right-sided chest wall pain 07/18/2015  . Lymphedema of upper extremity 11/10/2014  . Hx of adenomatous colonic polyps 03/03/2014  . Hypokalemia 10/29/2013  . Routine general medical examination at a health care facility 06/03/2013  . DJD (degenerative joint disease) of knee 11/24/2012  . Hyperlipidemia with target LDL less than 130 11/24/2012  . Cancer of upper-outer quadrant of female breast ([place]) 03/06/2012  . Goiter 07/26/2009  . VOCAL CORD DISORDER 07/24/2009  . Essential hypertension, benign  01/19/2007  . ALLERGIC RHINITIS 01/19/2007   Donato Heinz. Owens Shark, PT   Norwood Levo  12/07/2015, 6:02 PM  [facility] [place], [place], [address] Phone: [phone]   Fax:  [phone]  Name: Brooke Wood MRN: [id number] Date of Birth: 25-Apr-1965

## 2015-12-20 LAB — HM PAP SMEAR

## 2016-01-02 ENCOUNTER — Other Ambulatory Visit: Payer: Self-pay | Admitting: Internal Medicine

## 2016-01-02 ENCOUNTER — Other Ambulatory Visit: Payer: Self-pay | Admitting: *Deleted

## 2016-01-02 MED FILL — MELOXICAM 15 MG TABLET: 15 | 30 days supply | Qty: 30 | Fill #1

## 2016-01-02 MED FILL — VALSARTAN-HCTZ 160-25 MG TA: 160-25 | 90 days supply | Qty: 90 | Fill #0

## 2016-01-02 MED FILL — KLOR-CON M20 TABLET: 20 | 30 days supply | Qty: 90 | Fill #0

## 2016-01-02 MED FILL — TAMOXIFEN 20 MG TABLET: 20 | 90 days supply | Qty: 90 | Fill #0

## 2016-02-27 DIAGNOSIS — N926 Irregular menstruation, unspecified: Secondary | ICD-10-CM | POA: Diagnosis not present

## 2016-02-27 DIAGNOSIS — Z6824 Body mass index (BMI) 24.0-24.9, adult: Secondary | ICD-10-CM | POA: Diagnosis not present

## 2016-03-07 ENCOUNTER — Telehealth: Payer: Self-pay | Admitting: Internal Medicine

## 2016-03-07 NOTE — Telephone Encounter (Signed)
Please advise if there are any OTC meds pt can take for sinus congestion.

## 2016-03-07 NOTE — Telephone Encounter (Signed)
Patient called in wanting an appointment to be seen for maybe a sinus infection. She states she called yesterday but would not take any of the appointments for today because of the weather. Now we don't have any appointments for today. I offered her other appointments for tomorrow but once again she will not set one up. She just wanted the nurse to call her to advise her on what otc stuff she could take. I informed her you was busy and once you got a chance you would try to give her a call back.

## 2016-03-12 ENCOUNTER — Ambulatory Visit (INDEPENDENT_AMBULATORY_CARE_PROVIDER_SITE_OTHER): Payer: BLUE CROSS/BLUE SHIELD | Admitting: Internal Medicine

## 2016-03-12 ENCOUNTER — Encounter: Payer: Self-pay | Admitting: Internal Medicine

## 2016-03-12 VITALS — BP 138/78 | HR 110 | Temp 98.3°F | Resp 16 | Ht 65.0 in | Wt 157.2 lb

## 2016-03-12 DIAGNOSIS — J0100 Acute maxillary sinusitis, unspecified: Secondary | ICD-10-CM | POA: Diagnosis not present

## 2016-03-12 DIAGNOSIS — I10 Essential (primary) hypertension: Secondary | ICD-10-CM | POA: Diagnosis not present

## 2016-03-12 MED ORDER — PROMETHAZINE-DM 6.25-15 MG/5ML PO SYRP: 5.0000 mL | ORAL_SOLUTION | Freq: Four times a day (QID) | ORAL | 0 refills | Status: DC | PRN

## 2016-03-12 MED ORDER — CEFUROXIME AXETIL 500 MG PO TABS
500.0000 mg | ORAL_TABLET | Freq: Two times a day (BID) | ORAL | 0 refills | Status: AC
Start: 2016-03-12 — End: 2016-03-22

## 2016-03-12 MED FILL — PROMETHAZINE-DM SYRUP: 6.25-15 | 4 days supply | Qty: 118 | Fill #0

## 2016-03-12 MED FILL — CEFUROXIME AXETIL 500 MG TA: 500 | 10 days supply | Qty: 20 | Fill #0

## 2016-03-12 NOTE — Patient Instructions (Signed)

## 2016-03-12 NOTE — Progress Notes (Signed)
Pre visit review using our clinic review tool, if applicable. No additional management support is needed unless otherwise documented below in the visit note. 

## 2016-03-12 NOTE — Progress Notes (Signed)
Subjective:  Patient ID: Brooke Wood, female    DOB: Jun 05, 1965  Age: 50 y.o. MRN: [id number]  CC: Sinusitis   HPI Brooke Wood presents for a 10 day history of sinus pain, sore throat, postnasal drip, and chills but no fever, cough, lymphadenopathy, earaches, or night sweats.  Outpatient Medications Prior to Visit  Medication Sig Dispense Refill  . KLOR-CON M20 20 MEQ tablet TAKE 1 TABLET BY MOUTH 3 TIMES DAILY 90 tablet 5  . tamoxifen (NOLVADEX) 20 MG tablet TAKE 1 TABLET BY MOUTH ONCE DAILY 90 tablet 3  . valsartan-hydrochlorothiazide (DIOVAN-HCT) 160-25 MG tablet TAKE 1 TABLET BY MOUTH ONCE DAILY 90 tablet 1  . cetirizine (ZYRTEC) 10 MG tablet Take 1 tablet (10 mg total) by mouth daily. Reported on 04/04/2015 90 tablet 1  . miconazole (MICOTIN) 2 % powder Apply topically as needed for itching.     No facility-administered medications prior to visit.     ROS Review of Systems  Constitutional: Positive for chills. Negative for fatigue, fever and unexpected weight change.  HENT: Positive for congestion, postnasal drip, rhinorrhea, sinus pain and sinus pressure. Negative for ear pain, facial swelling, nosebleeds, sore throat and trouble swallowing.   Eyes: Negative.   Respiratory: Negative for cough, chest tightness, shortness of breath and stridor.   Cardiovascular: Negative.  Negative for chest pain, palpitations and leg swelling.  Gastrointestinal: Negative.  Negative for abdominal pain, constipation, diarrhea, nausea and vomiting.  Endocrine: Negative.   Genitourinary: Negative.  Negative for difficulty urinating, dysuria and hematuria.  Musculoskeletal: Negative.  Negative for back pain and myalgias.  Skin: Negative.  Negative for color change and rash.  Allergic/Immunologic: Negative.   Neurological: Negative.  Negative for dizziness.  Hematological: Negative.  Negative for adenopathy. Does not bruise/bleed easily.  Psychiatric/Behavioral: Negative.     Objective:  BP  138/78 (BP Location: Left Arm, Patient Position: Sitting, Cuff Size: Normal)   Pulse (!) 110   Temp 98.3 F (36.8 C) (Oral)   Resp 16   Ht 5\' 5"  (1.651 m)   Wt 157 lb 4 oz (71.3 kg)   SpO2 98%   BMI 26.17 kg/m   BP Readings from Last 3 Encounters:  03/12/16 138/78  11/14/15 118/85  11/02/15 128/82    Wt Readings from Last 3 Encounters:  03/12/16 157 lb 4 oz (71.3 kg)  11/14/15 152 lb (68.9 kg)  11/02/15 150 lb 3.2 oz (68.1 kg)    Physical Exam  Constitutional: She is oriented to person, place, and time. She appears well-developed and well-nourished.  Non-toxic appearance. She does not have a sickly appearance. She does not appear ill. No distress.  HENT:  Right Ear: Hearing, tympanic membrane, external ear and ear canal normal.  Left Ear: Hearing, tympanic membrane, external ear and ear canal normal.  Nose: No mucosal edema or rhinorrhea. No epistaxis. Right sinus exhibits maxillary sinus tenderness. Right sinus exhibits no frontal sinus tenderness. Left sinus exhibits maxillary sinus tenderness. Left sinus exhibits no frontal sinus tenderness.  Mouth/Throat: Mucous membranes are normal. Mucous membranes are not pale, not dry and not cyanotic. No oral lesions. No trismus in the jaw. No uvula swelling. No oropharyngeal exudate, posterior oropharyngeal edema, posterior oropharyngeal erythema or tonsillar abscesses.  Eyes: Conjunctivae are normal. Right eye exhibits no discharge. Left eye exhibits no discharge. No scleral icterus.  Neck: Normal range of motion. Neck supple. No JVD present. No tracheal deviation present. No thyromegaly present.  Cardiovascular: Normal rate, regular rhythm, normal heart  sounds and intact distal pulses.  Exam reveals no gallop and no friction rub.   No murmur heard. Pulmonary/Chest: Effort normal and breath sounds normal. No stridor. No respiratory distress. She has no wheezes. She has no rales. She exhibits no tenderness.  Abdominal: Soft. Bowel sounds  are normal. She exhibits no distension and no mass. There is no tenderness. There is no rebound and no guarding.  Musculoskeletal: Normal range of motion. She exhibits no edema, tenderness or deformity.  Lymphadenopathy:    She has no cervical adenopathy.  Neurological: She is oriented to person, place, and time.  Skin: Skin is warm and dry. No rash noted. She is not diaphoretic. No erythema. No pallor.  Vitals reviewed.   Lab Results  Component Value Date   WBC 7.0 09/14/2015   HGB 13.7 09/14/2015   HCT 40.8 09/14/2015   PLT 204 09/14/2015   GLUCOSE 92 09/14/2015   CHOL 167 04/19/2015   TRIG 75.0 04/19/2015   HDL 55.50 04/19/2015   LDLCALC 96 04/19/2015   ALT 15 09/14/2015   AST 11 09/14/2015   NA 137 09/14/2015   K 3.6 09/14/2015   CL 102 04/19/2015   CREATININE 0.7 11/02/2015   BUN 19.4 11/02/2015   CO2 30 (H) 09/14/2015   TSH 1.88 04/19/2015   HGBA1C 6.1 11/24/2012    No results found.  Assessment & Plan:   Colean was seen today for sinusitis.  Diagnoses and all orders for this visit:  Essential hypertension, benign- her blood pressure is adequately well-controlled  Acute non-recurrent maxillary sinusitis- I will treat the infection with Ceftin and will control her symptoms with Phenergan DM -     cefUROXime (CEFTIN) 500 MG tablet; Take 1 tablet (500 mg total) by mouth 2 (two) times daily. -     promethazine-dextromethorphan (PROMETHAZINE-DM) 6.25-15 MG/5ML syrup; Take 5 mLs by mouth 4 (four) times daily as needed for cough.   I have discontinued Ms. Meckley's cetirizine and miconazole. I am also having her start on cefUROXime and promethazine-dextromethorphan. Additionally, I am having her maintain her tamoxifen, valsartan-hydrochlorothiazide, and KLOR-CON M20.  Meds ordered this encounter  Medications  . cefUROXime (CEFTIN) 500 MG tablet    Sig: Take 1 tablet (500 mg total) by mouth 2 (two) times daily.    Dispense:  20 tablet    Refill:  0  .  promethazine-dextromethorphan (PROMETHAZINE-DM) 6.25-15 MG/5ML syrup    Sig: Take 5 mLs by mouth 4 (four) times daily as needed for cough.    Dispense:  118 mL    Refill:  0     Follow-up: Return in about 3 weeks (around 04/02/2016).  [name], MD

## 2016-03-13 ENCOUNTER — Telehealth: Payer: Self-pay | Admitting: Oncology

## 2016-03-13 NOTE — Telephone Encounter (Signed)
Left a message for Brooke Wood regarding rescheduling her appointment tomorrow.  Requested a return call.

## 2016-03-14 ENCOUNTER — Ambulatory Visit: Payer: BLUE CROSS/BLUE SHIELD | Admitting: Radiation Oncology

## 2016-03-18 ENCOUNTER — Encounter: Payer: Self-pay | Admitting: Radiation Oncology

## 2016-03-18 ENCOUNTER — Ambulatory Visit
Admission: RE | Admit: 2016-03-18 | Discharge: 2016-03-18 | Disposition: A | Discharge status: Home / Self Care | Payer: BLUE CROSS/BLUE SHIELD | Source: Ambulatory Visit | Attending: Radiation Oncology | Admitting: Radiation Oncology

## 2016-03-18 VITALS — BP 127/78 | HR 102 | Temp 98.3°F | Resp 18 | Ht 65.0 in | Wt 156.4 lb

## 2016-03-18 DIAGNOSIS — Z170 Estrogen receptor positive status [ER+]: Secondary | ICD-10-CM

## 2016-03-18 DIAGNOSIS — Z79811 Long term (current) use of aromatase inhibitors: Secondary | ICD-10-CM | POA: Diagnosis not present

## 2016-03-18 DIAGNOSIS — C50411 Malignant neoplasm of upper-outer quadrant of right female breast: Secondary | ICD-10-CM

## 2016-03-18 DIAGNOSIS — Z08 Encounter for follow-up examination after completed treatment for malignant neoplasm: Secondary | ICD-10-CM | POA: Diagnosis not present

## 2016-03-18 NOTE — Progress Notes (Signed)
Miss Brooke Wood is here for six month follow up appointment for right breast cancer.  Right breast has normal skin color.  Denies pain to right breast has some tenderness. Appetite is good.  Reports fatigue more in the morning.  Remains on Tamoxifen since 07-30-2012. Will get a mammogram in January 2018.  Will see Dr Jana Hakim in 05-2016. Wt Readings from Last 3 Encounters:  03/18/16 156 lb 6.4 oz (70.9 kg)  03/12/16 157 lb 4 oz (71.3 kg)  11/14/15 152 lb (68.9 kg)  BP 127/78   Pulse (!) 102   Temp 98.3 F (36.8 C) (Oral)   Resp 18   Ht 5\' 5"  (1.651 m)   Wt 156 lb 6.4 oz (70.9 kg)   SpO2 98%   BMI 26.03 kg/m

## 2016-03-18 NOTE — Progress Notes (Signed)
  Radiation Oncology         [phone] ________________________________  Name: Brooke Wood MRN: [id number]  Date: 03/18/2016  DOB: [birth year]  Follow-Up Visit Note  CC: [name], MD  Marcy Panning, MD    ICD-9-CM ICD-10-CM   1. 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 The site of presentation (lumpectomy cavity) in the upper outer quadrant of the right breast was boosted to a cumulative dose of 60.4 gray   Narrative: [name] presents for a routine follow-up. She reports some tenderness to the right breast. She notes a good appetite and denies pain. She is positive for fatigue in the morning. She remains on Tamoxifen since 07/30/2012. Per nursing, right breast is normal skin color.  ALLERGIES:  is allergic to aspirin; codeine; and codeine phosphate.  Meds: Current Outpatient Prescriptions  Medication Sig Dispense Refill  . cefUROXime (CEFTIN) 500 MG tablet Take 1 tablet (500 mg total) by mouth 2 (two) times daily. 20 tablet 0  . KLOR-CON M20 20 MEQ tablet TAKE 1 TABLET BY MOUTH 3 TIMES DAILY 90 tablet 5  . tamoxifen (NOLVADEX) 20 MG tablet TAKE 1 TABLET BY MOUTH ONCE DAILY 90 tablet 3  . valsartan-hydrochlorothiazide (DIOVAN-HCT) 160-25 MG tablet TAKE 1 TABLET BY MOUTH ONCE DAILY 90 tablet 1  . promethazine-dextromethorphan (PROMETHAZINE-DM) 6.25-15 MG/5ML syrup Take 5 mLs by mouth 4 (four) times daily as needed for cough. (Patient not taking: Reported on 03/18/2016) 118 mL 0   No current facility-administered medications for this encounter.     Physical Findings: The patient is in no acute distress. Patient is alert and oriented.  height is 5\' 5"  (1.651 m) and weight is 156 lb  6.4 oz (70.9 kg). Her oral temperature is 98.3 F (36.8 C). Her blood pressure is 127/78 and her pulse is 102 (abnormal). Her respiration is 18 and oxygen saturation is 98%.   Lungs are clear to auscultation bilaterally. Heart has regular rate and rhythm. No palpable cervical, supraclavicular, or axillary adenopathy. Abdomen soft, non-tender, normal bowel sounds. Mild hyperpigmentation changes in the right breast. No palpable or visible signs of recurrence.  Lab Findings: Lab Results  Component Value Date   WBC 7.0 09/14/2015   HGB 13.7 09/14/2015   HCT 40.8 09/14/2015   MCV 88.3 09/14/2015   PLT 204 09/14/2015    Radiographic Findings: No results found.  Impression:  No evidence of recurrence on clinical exam today.  Plan: Mammogram is scheduled for January. Patient will follow up with Dr. Jana Hakim in February. Follow up with radiation oncology in May 2018. The patient requests continue follow-up in radiation oncology in light of her anxiety. -----------------------------------  Blair Promise, PhD, MD  This document serves as a record of services personally performed by Gery Pray, MD. It was created on his behalf by Bethann Humble, a trained medical scribe. The creation of this record is based on the scribe's personal observations and the provider's statements to them. This document has been checked and approved by the attending provider.

## 2016-03-26 ENCOUNTER — Other Ambulatory Visit: Payer: Self-pay | Admitting: Oncology

## 2016-03-26 DIAGNOSIS — Z853 Personal history of malignant neoplasm of breast: Secondary | ICD-10-CM

## 2016-03-26 MED FILL — TAMOXIFEN 20 MG TABLET: 20 | 90 days supply | Qty: 90 | Fill #1

## 2016-03-26 MED FILL — VALSARTAN-HCTZ 160-25 MG TA: 160-25 | 90 days supply | Qty: 90 | Fill #1

## 2016-03-26 MED FILL — KLOR-CON M20 TABLET: 20 | 30 days supply | Qty: 90 | Fill #1

## 2016-04-03 ENCOUNTER — Other Ambulatory Visit: Payer: Self-pay | Admitting: Internal Medicine

## 2016-04-03 ENCOUNTER — Telehealth: Payer: Self-pay | Admitting: Internal Medicine

## 2016-04-03 DIAGNOSIS — J0100 Acute maxillary sinusitis, unspecified: Secondary | ICD-10-CM

## 2016-04-03 MED ORDER — CEFUROXIME AXETIL 500 MG PO TABS: 500.0000 mg | ORAL_TABLET | Freq: Two times a day (BID) | ORAL | 0 refills | Status: DC

## 2016-04-03 NOTE — Telephone Encounter (Signed)
Please advise if you are okay with working patient in today.

## 2016-04-03 NOTE — Telephone Encounter (Signed)
Patient states that she has never finished ceftin 500mg .  She would like to know if she can start back on this and get another script for the same medication?

## 2016-04-03 NOTE — Telephone Encounter (Signed)
Patient states she has same issues she had on 12/12.  Patient states she has cancer and would like to be worked in today.

## 2016-04-03 NOTE — Telephone Encounter (Signed)
RX sent

## 2016-04-03 NOTE — Telephone Encounter (Signed)
Pt ware to pick rx up,ok for her to restart this med.

## 2016-04-04 ENCOUNTER — Ambulatory Visit: Payer: BLUE CROSS/BLUE SHIELD | Admitting: Internal Medicine

## 2016-04-05 ENCOUNTER — Ambulatory Visit (INDEPENDENT_AMBULATORY_CARE_PROVIDER_SITE_OTHER): Payer: BLUE CROSS/BLUE SHIELD | Admitting: Internal Medicine

## 2016-04-05 ENCOUNTER — Encounter: Payer: Self-pay | Admitting: Internal Medicine

## 2016-04-05 VITALS — BP 120/80 | HR 97 | Temp 98.3°F | Resp 12 | Ht 65.0 in | Wt 156.2 lb

## 2016-04-05 DIAGNOSIS — J3089 Other allergic rhinitis: Secondary | ICD-10-CM

## 2016-04-05 DIAGNOSIS — Z23 Encounter for immunization: Secondary | ICD-10-CM | POA: Diagnosis not present

## 2016-04-05 MED ORDER — BENZONATATE 200 MG PO CAPS: 200.0000 mg | ORAL_CAPSULE | Freq: Two times a day (BID) | ORAL | 0 refills | Status: DC | PRN

## 2016-04-05 NOTE — Progress Notes (Signed)
   Subjective:    Patient ID: Brooke Wood, female    DOB: [birth year], 51 y.o.   MRN: [id number]  HPI The patient is a 51 YO female coming in for 1 day of congestion and eye weeping and mild cough. No fevers or chills. Has not tried anything for the symptoms. Does not take allergy medicine right now. No sinus pain or throat pain. No SOB or activity change.   Review of Systems  Constitutional: Negative for activity change, appetite change, chills, fatigue, fever and unexpected weight change.  HENT: Positive for congestion and rhinorrhea. Negative for ear discharge, ear pain, postnasal drip, sinus pain, sinus pressure, sore throat and trouble swallowing.   Eyes: Positive for discharge.  Respiratory: Positive for cough. Negative for chest tightness, shortness of breath and wheezing.   Cardiovascular: Negative.   Gastrointestinal: Negative.       Objective:   Physical Exam  Constitutional: She is oriented to person, place, and time. She appears well-developed and well-nourished.  HENT:  Head: Normocephalic and atraumatic.  Right Ear: External ear normal.  Left Ear: External ear normal.  Nose with redness and clear drainage, oropharynx with mild clear drainage.   Eyes: EOM are normal.  Cardiovascular: Normal rate and regular rhythm.   Pulmonary/Chest: Effort normal and breath sounds normal. No respiratory distress. She has no wheezes. She has no rales.  Abdominal: Soft.  Lymphadenopathy:    She has no cervical adenopathy.  Neurological: She is alert and oriented to person, place, and time.  Skin: Skin is warm and dry.    Vitals:   04/05/16 1035  BP: 120/80  Pulse: 97  Resp: 12  Temp: 98.3 F (36.8 C)  TempSrc: Oral  SpO2: 98%  Weight: 156 lb 4 oz (70.9 kg)  Height: 5\' 5"  (1.651 m)      Assessment & Plan:  Flu shot given at visit.

## 2016-04-05 NOTE — Progress Notes (Signed)
Pre visit review using our clinic review tool, if applicable. No additional management support is needed unless otherwise documented below in the visit note. 

## 2016-04-05 NOTE — Assessment & Plan Note (Signed)
Not taking anything for her symptoms. May be early viral URI or just allergies. Advised to take zyrtec otc and rx for tessalon perles. She just picked up antibiotic which she should have taken weeks ago. She does not need antibiotics today and she was advised to not take the ceftin as it is not indicated. Steroids also are not indicated.

## 2016-04-05 NOTE — Patient Instructions (Addendum)
You can take zyrtec over the counter for the allergies.   We have sent in a cough medicine which is non-drowsy and safe to take during the day called tessalon perles. You can take 1 pill up to 3 times per day.   You do not need an antibiotic so do not take the cefuroxime 500 mg as you do not need it.  It is also okay to take cold medicine over the counter if you need it for the symptoms.   Sometimes warm liquids can help break up the congestion as well.   Likely these symptoms will last 7-10 days but the cough may last another 1-2 weeks after that.    Upper Respiratory Infection, Adult Most upper respiratory infections (URIs) are a viral infection of the air passages leading to the lungs. A URI affects the nose, throat, and upper air passages. The most common type of URI is nasopharyngitis and is typically referred to as "the common cold." URIs run their course and usually go away on their own. Most of the time, a URI does not require medical attention, but sometimes a bacterial infection in the upper airways can follow a viral infection. This is called a secondary infection. Sinus and middle ear infections are common types of secondary upper respiratory infections. Bacterial pneumonia can also complicate a URI. A URI can worsen asthma and chronic obstructive pulmonary disease (COPD). Sometimes, these complications can require emergency medical care and may be life threatening. What are the causes? Almost all URIs are caused by viruses. A virus is a type of germ and can spread from one person to another. What increases the risk? You may be at risk for a URI if:  You smoke.  You have chronic heart or lung disease.  You have a weakened defense (immune) system.  You are very young or very old.  You have nasal allergies or asthma.  You work in crowded or poorly ventilated areas.  You work in health care facilities or schools. What are the signs or symptoms? Symptoms typically develop  2-3 days after you come in contact with a cold virus. Most viral URIs last 7-10 days. However, viral URIs from the influenza virus (flu virus) can last 14-18 days and are typically more severe. Symptoms may include:  Runny or stuffy (congested) nose.  Sneezing.  Cough.  Sore throat.  Headache.  Fatigue.  Fever.  Loss of appetite.  Pain in your forehead, behind your eyes, and over your cheekbones (sinus pain).  Muscle aches. How is this diagnosed? Your health care provider may diagnose a URI by:  Physical exam.  Tests to check that your symptoms are not due to another condition such as:  Strep throat.  Sinusitis.  Pneumonia.  Asthma. How is this treated? A URI goes away on its own with time. It cannot be cured with medicines, but medicines may be prescribed or recommended to relieve symptoms. Medicines may help:  Reduce your fever.  Reduce your cough.  Relieve nasal congestion. Follow these instructions at home:  Take medicines only as directed by your health care provider.  Gargle warm saltwater or take cough drops to comfort your throat as directed by your health care provider.  Use a warm mist humidifier or inhale steam from a shower to increase air moisture. This may make it easier to breathe.  Drink enough fluid to keep your urine clear or pale yellow.  Eat soups and other clear broths and maintain good nutrition.  Rest as  needed.  Return to work when your temperature has returned to normal or as your health care provider advises. You may need to stay home longer to avoid infecting others. You can also use a face mask and careful hand washing to prevent spread of the virus.  Increase the usage of your inhaler if you have asthma.  Do not use any tobacco products, including cigarettes, chewing tobacco, or electronic cigarettes. If you need help quitting, ask your health care provider. How is this prevented? The best way to protect yourself from getting  a cold is to practice good hygiene.  Avoid oral or hand contact with people with cold symptoms.  Wash your hands often if contact occurs. There is no clear evidence that vitamin C, vitamin E, echinacea, or exercise reduces the chance of developing a cold. However, it is always recommended to get plenty of rest, exercise, and practice good nutrition. Contact a health care provider if:  You are getting worse rather than better.  Your symptoms are not controlled by medicine.  You have chills.  You have worsening shortness of breath.  You have brown or red mucus.  You have yellow or brown nasal discharge.  You have pain in your face, especially when you bend forward.  You have a fever.  You have swollen neck glands.  You have pain while swallowing.  You have white areas in the back of your throat. Get help right away if:  You have severe or persistent:  Headache.  Ear pain.  Sinus pain.  Chest pain.  You have chronic lung disease and any of the following:  Wheezing.  Prolonged cough.  Coughing up blood.  A change in your usual mucus.  You have a stiff neck.  You have changes in your:  Vision.  Hearing.  Thinking.  Mood. This information is not intended to replace advice given to you by your health care provider. Make sure you discuss any questions you have with your health care provider. Document Released: 09/11/2000 Document Revised: 11/19/2015 Document Reviewed: 06/23/2013 Elsevier Interactive Patient Education  2017 [employer].

## 2016-04-08 MED FILL — CEFUROXIME AXETIL 500 MG TA: 500 | 10 days supply | Qty: 20 | Fill #0

## 2016-05-01 ENCOUNTER — Ambulatory Visit
Admission: RE | Admit: 2016-05-01 | Discharge: 2016-05-01 | Disposition: A | Discharge status: Home / Self Care | Payer: BLUE CROSS/BLUE SHIELD | Source: Ambulatory Visit | Attending: Oncology | Admitting: Oncology

## 2016-05-01 DIAGNOSIS — Z853 Personal history of malignant neoplasm of breast: Secondary | ICD-10-CM

## 2016-05-01 DIAGNOSIS — R922 Inconclusive mammogram: Secondary | ICD-10-CM | POA: Diagnosis not present

## 2016-05-09 DIAGNOSIS — Z6825 Body mass index (BMI) 25.0-25.9, adult: Secondary | ICD-10-CM | POA: Diagnosis not present

## 2016-05-09 DIAGNOSIS — N83202 Unspecified ovarian cyst, left side: Secondary | ICD-10-CM | POA: Diagnosis not present

## 2016-05-15 ENCOUNTER — Other Ambulatory Visit: Payer: Self-pay | Admitting: *Deleted

## 2016-05-15 DIAGNOSIS — Z170 Estrogen receptor positive status [ER+]: Secondary | ICD-10-CM

## 2016-05-15 DIAGNOSIS — C50411 Malignant neoplasm of upper-outer quadrant of right female breast: Secondary | ICD-10-CM

## 2016-05-16 ENCOUNTER — Other Ambulatory Visit (HOSPITAL_BASED_OUTPATIENT_CLINIC_OR_DEPARTMENT_OTHER): Payer: BLUE CROSS/BLUE SHIELD

## 2016-05-16 ENCOUNTER — Ambulatory Visit (HOSPITAL_BASED_OUTPATIENT_CLINIC_OR_DEPARTMENT_OTHER): Payer: BLUE CROSS/BLUE SHIELD | Admitting: Oncology

## 2016-05-16 ENCOUNTER — Other Ambulatory Visit: Payer: Self-pay | Admitting: *Deleted

## 2016-05-16 VITALS — BP 136/83 | HR 87 | Temp 97.9°F | Resp 18 | Ht 65.0 in | Wt 155.5 lb

## 2016-05-16 DIAGNOSIS — Z170 Estrogen receptor positive status [ER+]: Secondary | ICD-10-CM

## 2016-05-16 DIAGNOSIS — C50411 Malignant neoplasm of upper-outer quadrant of right female breast: Secondary | ICD-10-CM

## 2016-05-16 DIAGNOSIS — N83202 Unspecified ovarian cyst, left side: Secondary | ICD-10-CM

## 2016-05-16 DIAGNOSIS — N83209 Unspecified ovarian cyst, unspecified side: Secondary | ICD-10-CM

## 2016-05-16 LAB — COMPREHENSIVE METABOLIC PANEL
ALBUMIN: 3.8 g/dL (ref 3.5–5.0)
ALK PHOS: 98 U/L (ref 40–150)
ALT: 15 U/L (ref 0–55)
ANION GAP: 11 meq/L (ref 3–11)
AST: 12 U/L (ref 5–34)
BILIRUBIN TOTAL: 0.25 mg/dL (ref 0.20–1.20)
BUN: 17.3 mg/dL (ref 7.0–26.0)
CALCIUM: 9.7 mg/dL (ref 8.4–10.4)
CO2: 27 mEq/L (ref 22–29)
Chloride: 101 mEq/L (ref 98–109)
Creatinine: 0.7 mg/dL (ref 0.6–1.1)
Glucose: 115 mg/dl (ref 70–140)
POTASSIUM: 3.4 meq/L — AB (ref 3.5–5.1)
Sodium: 138 mEq/L (ref 136–145)
Total Protein: 7.2 g/dL (ref 6.4–8.3)

## 2016-05-16 LAB — CBC WITH DIFFERENTIAL/PLATELET
BASO%: 0.4 % (ref 0.0–2.0)
BASOS ABS: 0 10*3/uL (ref 0.0–0.1)
EOS ABS: 0.1 10*3/uL (ref 0.0–0.5)
EOS%: 0.9 % (ref 0.0–7.0)
HEMATOCRIT: 42.2 % (ref 34.8–46.6)
HEMOGLOBIN: 14 g/dL (ref 11.6–15.9)
LYMPH#: 2.1 10*3/uL (ref 0.9–3.3)
LYMPH%: 39.3 % (ref 14.0–49.7)
MCH: 29 pg (ref 25.1–34.0)
MCHC: 33.2 g/dL (ref 31.5–36.0)
MCV: 87.6 fL (ref 79.5–101.0)
MONO#: 0.6 10*3/uL (ref 0.1–0.9)
MONO%: 10.4 % (ref 0.0–14.0)
NEUT#: 2.6 10*3/uL (ref 1.5–6.5)
NEUT%: 49 % (ref 38.4–76.8)
PLATELETS: 191 10*3/uL (ref 145–400)
RBC: 4.82 10*6/uL (ref 3.70–5.45)
RDW: 13.4 % (ref 11.2–14.5)
WBC: 5.4 10*3/uL (ref 3.9–10.3)

## 2016-05-16 NOTE — Progress Notes (Signed)
ID: Brooke Wood OB: [birth year]  MR#: [id number]  KGY#:[id number]  PCP: [name], MD GYN:   SU:  OTHER MD:  CHIEF COMPLAINT: Right invasive tubular lobular breast cancer  CURRENT THERAPY: Tamoxifen 24m PO daily  BREAST CANCER HISTORY:  TAvalenepalpated a lump in the right breast in early November 2013. [facility] and these were performed on 03/04/2012. The mass measured 2.8cm mammographically. The ultrasound showed a spiculated mass in the 11 o'clock location of the right breast, 8 cm from the nipple measuring 2.9 cm. The right axilla showed no adenopathy.   An ultrasound guided core biopsy of the right breast on 03/04/2012 revealed invasive ductal carcinoma, grade 2, ER positive, PR positive, HER-2/neu negative, Ki-67 20%. An MRI performed on 03/10/2012 showed the mass to be 2.7cm and irregular. No abnormalities were found in the left breast, or either axilla. Her case was discussed at the multidisciplinary breast clinic on 03/11/2012 and referred for genetic counseling and testing.   A right breast lumpectomy with sentinel lymph node biopsy was performed on 04/09/2012. 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 One sentinel node had isolated tumor cells. All margins clean.   The patient had an Oncotype DX performed. Her score was 10, giving her a 7% risk of distance recurrence with tamoxifen for 5 year. She was referred to radiation oncology. Her subsequent history is as follows.   INTERVAL HISTORY: Brooke Wood today for follow-up of her estrogen receptor positive breast cancer. She continues on tamoxifen with good tolerance. She does have hot flashes but "best as part of life". She obtains a drug at a good price.  She was found to have a left adnexal lesion which by her report ultrasound shows to be a cyst. She is  considering bilateral salpingo-oophorectomy for this. Her last menstrual period was 01/07/2016. Before that it was February 2017  REVIEW OF SYSTEMS: A detailed review of systems today was otherwise noncontributory. She is not exercising as much as I would like but she is planning to return the Y  PAST MEDICAL HISTORY: Past Medical History:  Diagnosis Date  . Allergy    Neg RAST 2007  . [place]    [place] breast  . Hx of adenomatous colonic polyps 03/03/2014  . Hx of radiation therapy 06/15/12 - 07/29/12   right rbeast, 50.4 Gy x 28 fx, boost to cumulative dose 60.4 gray  . Hypertension   . Vocal cord dysfunction    Neg Methacholine challenge test 2007   PAST SURGICAL HISTORY: Past Surgical History:  Procedure Laterality Date  . BREAST LUMPECTOMY WITH SENTINEL LYMPH NODE BIOPSY  04/09/2012   Procedure: BREAST LUMPECTOMY WITH SENTINEL LYMPH NODE BX;  Surgeon: PMerrie Roof MD;  [facility]  Service: General;  Laterality: Right;  . BREAST SURGERY  1985   rt br bx   FAMILY HISTORY Family History  Problem Relation Age of Onset  . Hypertension Other   . Alcohol abuse Neg Hx   . Diabetes Neg Hx   . Early death Neg Hx   . Hearing loss Neg Hx   . Heart disease Neg Hx   . Hyperlipidemia Neg Hx   . Stroke Neg Hx   . Kidney disease Neg Hx   . Colon cancer Neg Hx   . Breast cancer  Neg Hx   . Lung cancer Father 21    smoker  . Cancer Father     lung  Her mother is still alive in her 34s and is in good health. Her father passed away when she was a teenager from lung cancer. Her 3 brothers are younger, and have no significant health history that she can recall.   GYNECOLOGIC HISTORY:  Menarche occurred at age 11. The patient is GX P1, giving birth to her son about age 102-25. She is still menstruating. She was on birth control pills for 10+ years.   SOCIAL HISTORY: Brooke Wood lives at home alone. 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 Her son, Brooke Wood graduated with his masters degree in sports education and management from [employer] in May 2015. Her mother, Brooke Wood, lives 5 minutes away and is supportive. 2 of her brothers reside in [place], the other in [place]. She was married to her son's father for 9-10 years, but they got divorced in her 10s.  She is a Psychologist, forensic but "still needs to find a good church home."    ADVANCED DIRECTIVES: Not in place at this time. She intends to name her son, Brooke Wood, as her healthcare power of attorney. "[name]" can be reached at [phone].   HEALTH MAINTENANCE: Social History  Substance Use Topics  . Smoking status: Never Smoker  . Smokeless tobacco: Never Used  . Alcohol use No    Colonoscopy: "needs to schedule when insurance obtained" Bone Density Scan: never Pap Smear: UTD/2013 Lipid Panel:    Allergies  Allergen Reactions  . Aspirin Hives  . Codeine Other (See Comments)    "confusion"  . Codeine Phosphate     REACTION: unspecified    Current Outpatient Prescriptions  Medication Sig Dispense Refill  . benzonatate (TESSALON) 200 MG capsule Take 1 capsule (200 mg total) by mouth 2 (two) times daily as needed for cough. 60 capsule 0  . KLOR-CON M20 20 MEQ tablet TAKE 1 TABLET BY MOUTH 3 TIMES DAILY 90 tablet 5  . promethazine-dextromethorphan (PROMETHAZINE-DM) 6.25-15 MG/5ML syrup Take 5 mLs by mouth 4 (four) times daily as needed for cough. 118 mL 0  . tamoxifen (NOLVADEX) 20 MG tablet TAKE 1 TABLET BY MOUTH ONCE DAILY 90 tablet 3  . valsartan-hydrochlorothiazide (DIOVAN-HCT) 160-25 MG tablet TAKE 1 TABLET BY MOUTH ONCE DAILY 90 tablet 1   No current facility-administered medications for this visit.     OBJECTIVE: Middle-aged African-American woman who appears younger than stated age 51:   05/16/16 1046  BP: 136/83  Pulse: 87  Resp: 18   Temp: 97.9 F (36.6 C)     Body mass index is 25.88 kg/m.  ECOG FS:0 - Asymptomatic  ROS   Physical Exam`  Sclerae unicteric, pupils round and equal Oropharynx clear and moist-- no thrush or other lesions No cervical or supraclavicular adenopathy Lungs no rales or rhonchi Heart regular rate and rhythm Abd soft, nontender, positive bowel sounds MSK no focal spinal tenderness, no upper extremity lymphedema Neuro: nonfocal, well oriented, appropriate affect Breasts: The right breast has undergone lumpectomy followed by radiation, with no evidence of local recurrence. The left breast is unremarkable. Both axillae are benign.   LAB RESULTS: Lab Results  Component Value Date   WBC 5.4 05/16/2016   HGB 14.0 05/16/2016   HCT 42.2  05/16/2016   MCV 87.6 05/16/2016   PLT 191 05/16/2016     Chemistry      Component Value Date/Time   NA 138 05/16/2016 1006   K 3.4 (L) 05/16/2016 1006   CL 102 04/19/2015 0838   CL 98 07/24/2012 1248   CO2 27 05/16/2016 1006   BUN 17.3 05/16/2016 1006   CREATININE 0.7 05/16/2016 1006      Component Value Date/Time   CALCIUM 9.7 05/16/2016 1006   ALKPHOS 98 05/16/2016 1006   AST 12 05/16/2016 1006   ALT 15 05/16/2016 1006   BILITOT 0.25 05/16/2016 1006      Urinalysis No results found for: COLORURINE  STUDIES: CLINICAL DATA:  History of right breast cancer status post lumpectomy in 2014.  EXAM: 2D DIGITAL DIAGNOSTIC BILATERAL MAMMOGRAM WITH CAD AND ADJUNCT TOMO  COMPARISON:  Previous exam(s).  ACR Breast Density Category c: The breast tissue is heterogeneously dense, which may obscure small masses.  FINDINGS: Stable lumpectomy changes are seen in the right breast. No suspicious mass or malignant type microcalcifications identified in either breast.  Mammographic images were processed with CAD.  IMPRESSION: No evidence of malignancy in either breast.  RECOMMENDATION: Bilateral diagnostic mammogram in 1 year is  recommended.  I have discussed the findings and recommendations with the patient. Results were also provided in writing at the conclusion of the visit. If applicable, a reminder letter will be sent to the patient regarding the next appointment.  BI-RADS CATEGORY  2: Benign.   Electronically Signed   By: Lillia Mountain M.D.   On: 05/01/2016 10:30  ASSESSMENT:  51 y.o. [place] woman   (1) status post right breast biopsy on 03/11/2012 for an invasive ductal carcinoma with tubular features, grade 2, ER positive, PR positive, HER-2/neu negative, Ki-67 20%  (2) status post definitive right lumpectomy and sentinel lymph node biopsy on 04/09/2012. This revealed a 2.8 cm invasive tubular lobular carcinoma,T2 N0(i+), stage IIA, grade 2, ER 99%, PR 4%, HER-2/neu negative, Ki-67 60%. One sentinel node had isolated tumor cells. All margins clean.   (a) in the 2018 calcification her tumor would be stage IB  (3) Oncotype DX performed. Her score was 10, giving her a 7% risk of distance recurrence with tamoxifen for 5 year.   (4) She completed radiation therapy in April 2015  (5) She began tamoxifen in May 2014   PLAN:  Laurella is now a little over 4 years out from her definitive surgery for breast cancer with no evidence of disease recurrence. This is very favorable.  She is tolerating tamoxifen well. The plan is to continue that for a total of 5 years, which means through April 2019. She is very excited at the possibility of "getting out of the cancer business" next year  She is very concerned about the ovarian cyst that Dr. Philis Pique is following. Fidelis is very interested in bilateral salpingo-oophorectomy. For women her age on tamoxifen removing the ovaries does not further decrease the risk of breast cancer. Nevertheless it is a relatively simple operation and it will of course greatly cut down her risk of ovarian cancer. More importantly I think for her it will remove the worry and concerned  she has and the need for intensive follow-up and evaluation for ovarian cancer in the future. In short she very much favors proceeding with bilateral salpingo-oophorectomy I am entirely comfortable with that decision.  She will see me one last time next year. After that she may or may not  wish to follow-up with our survivorship program  She knows to call for any problems that may develop before then. Chauncey Cruel, MD 05/16/2016 11:26 AM

## 2016-05-17 LAB — CA 125: Cancer Antigen (CA) 125: 17.4 U/mL (ref 0.0–38.1)

## 2016-06-04 DIAGNOSIS — N83209 Unspecified ovarian cyst, unspecified side: Secondary | ICD-10-CM | POA: Diagnosis not present

## 2016-06-04 DIAGNOSIS — Z6825 Body mass index (BMI) 25.0-25.9, adult: Secondary | ICD-10-CM | POA: Diagnosis not present

## 2016-06-10 ENCOUNTER — Other Ambulatory Visit: Payer: Self-pay | Admitting: Internal Medicine

## 2016-06-10 MED FILL — TAMOXIFEN 20 MG TABLET: 20 | 90 days supply | Qty: 90 | Fill #2

## 2016-06-10 MED FILL — POTASSIUM CL ER 20 MEQ TAB: 20 | 30 days supply | Qty: 90 | Fill #2

## 2016-06-27 ENCOUNTER — Ambulatory Visit (INDEPENDENT_AMBULATORY_CARE_PROVIDER_SITE_OTHER): Payer: BLUE CROSS/BLUE SHIELD

## 2016-06-27 ENCOUNTER — Ambulatory Visit (INDEPENDENT_AMBULATORY_CARE_PROVIDER_SITE_OTHER): Payer: BLUE CROSS/BLUE SHIELD | Admitting: Orthopaedic Surgery

## 2016-06-27 DIAGNOSIS — M25552 Pain in left hip: Secondary | ICD-10-CM | POA: Diagnosis not present

## 2016-06-27 DIAGNOSIS — M5442 Lumbago with sciatica, left side: Secondary | ICD-10-CM

## 2016-06-27 MED ORDER — METHYLPREDNISOLONE 4 MG PO TABS: ORAL_TABLET | ORAL | 0 refills | Status: DC

## 2016-06-27 NOTE — Progress Notes (Signed)
The patient is coming today to evaluate two-week history of left-sided hip and leg pain. 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 It does wake her up at night and sometimes is hard getting a comfortable position. She has a history of breast cancer and she is on tamoxifen this at this been not an issue. She says sometimes the knee feels like his give out on her. Again is been no injury or last 2 weeks his pain. Tried any medicines at all even any type of anti-inflammatories to help her situation feel better. She denies any current illnesses. She denies any chest pain, shortness of breath, fever, chills, nausea, vomiting.  On examination her exam is basically normal on her left lower extremity her back except for some pain over trochanteric area. She is neurovascular intact in her knee is ligamentously stable. An AP and lateral lumbar spine show no acute findings. Her alignment is well maintained disc heights and vertebral bodies well-maintained. An AP pelvis lateral of her left hip show a normal-appearing bone throughout the pelvis and hip. I see no evidence of any arthritic changes of the hip either.  I do feel this is likely trochanteric bursitis. I will try six-day steroid taper as well as some topical anti-inflammatories. We'll see her back in about 3 weeks to see how she doing overall. We may consider trochanteric injection that visit she still hurting in that area.

## 2016-07-17 MED FILL — VALSARTAN-HCTZ 160-25 MG TA: 160-25 | 90 days supply | Qty: 90 | Fill #0

## 2016-07-24 ENCOUNTER — Ambulatory Visit (INDEPENDENT_AMBULATORY_CARE_PROVIDER_SITE_OTHER): Payer: BLUE CROSS/BLUE SHIELD | Admitting: Orthopaedic Surgery

## 2016-08-01 ENCOUNTER — Ambulatory Visit
Admission: RE | Admit: 2016-08-01 | Discharge: 2016-08-01 | Disposition: A | Discharge status: Home / Self Care | Payer: BLUE CROSS/BLUE SHIELD | Source: Ambulatory Visit | Attending: Radiation Oncology | Admitting: Radiation Oncology

## 2016-08-01 ENCOUNTER — Encounter: Payer: Self-pay | Admitting: Radiation Oncology

## 2016-08-01 VITALS — BP 125/96 | HR 88 | Temp 98.4°F | Resp 20 | Wt 158.0 lb

## 2016-08-01 DIAGNOSIS — Z170 Estrogen receptor positive status [ER+]: Secondary | ICD-10-CM

## 2016-08-01 DIAGNOSIS — Z885 Allergy status to narcotic agent status: Secondary | ICD-10-CM | POA: Insufficient documentation

## 2016-08-01 DIAGNOSIS — Z79899 Other long term (current) drug therapy: Secondary | ICD-10-CM | POA: Insufficient documentation

## 2016-08-01 DIAGNOSIS — C50411 Malignant neoplasm of upper-outer quadrant of right female breast: Secondary | ICD-10-CM | POA: Diagnosis not present

## 2016-08-01 DIAGNOSIS — Z08 Encounter for follow-up examination after completed treatment for malignant neoplasm: Secondary | ICD-10-CM | POA: Diagnosis not present

## 2016-08-01 DIAGNOSIS — Z79810 Long term (current) use of selective estrogen receptor modulators (SERMs): Secondary | ICD-10-CM | POA: Diagnosis not present

## 2016-08-01 DIAGNOSIS — Z886 Allergy status to analgesic agent status: Secondary | ICD-10-CM | POA: Diagnosis not present

## 2016-08-01 NOTE — Progress Notes (Signed)
  Radiation Oncology         [phone]) [phone] ________________________________  Name: BLEU MOISAN MRN: [id number]  Date: 08/01/2016  DOB: [birth year]  Follow-Up Visit Note  CC: [name], MD  [name], MD    ICD-9-CM ICD-10-CM   1. 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 The site of presentation (lumpectomy cavity) in the upper outer quadrant of the right breast was boosted to a cumulative dose of 60.4 gray   Narrative: Ms. Bowditch presents for a routine follow-up. She denies pain at this time. She denies issues with appetite or fatigue. She states she will be scheduled for a hysterectomy in the near future as preventative measures.  ALLERGIES:  is allergic to aspirin; codeine; and codeine phosphate.  Meds: Current Outpatient Prescriptions  Medication Sig Dispense Refill  . KLOR-CON M20 20 MEQ tablet TAKE 1 TABLET BY MOUTH 3 TIMES DAILY 90 tablet 5  . tamoxifen (NOLVADEX) 20 MG tablet TAKE 1 TABLET BY MOUTH ONCE DAILY 90 tablet 3  . valsartan-hydrochlorothiazide (DIOVAN-HCT) 160-25 MG tablet TAKE 1 TABLET BY MOUTH ONCE DAILY 90 tablet 1   No current facility-administered medications for this encounter.     Physical Findings: The patient is in no acute distress. Patient is alert and oriented.  weight is 158 lb (71.7 kg). Her oral temperature is 98.4 F (36.9 C). Her blood pressure is 125/96 (abnormal) and her pulse is 88. Her respiration is 20 and oxygen saturation is 100%.   Lungs are clear to auscultation bilaterally. Heart has regular rate and rhythm. No palpable cervical, supraclavicular, or axillary adenopathy. Abdomen soft, non-tender, normal bowel sounds. Mild hyperpigmentation changes in  the right breast. No palpable mass, nipple discharge, or bleeding in bilateral breasts. Lab Findings: Lab Results  Component Value Date   WBC 5.4 05/16/2016   HGB 14.0 05/16/2016   HCT 42.2 05/16/2016   MCV 87.6 05/16/2016   PLT 191 05/16/2016    Radiographic Findings: No results found.  Impression:  No evidence of recurrence on clinical exam today.  Plan: Patient will follow-up in radiation oncology in 6 months. She will continue on Tamoxifen.  -----------------------------------  Blair Promise, PhD, MD  This document serves as a record of services personally performed by Gery Pray, MD. It was created on his behalf by Bethann Humble, a trained medical scribe. The creation of this record is based on the scribe's personal observations and the provider's statements to them. This document has been checked and approved by the attending provider.

## 2016-08-01 NOTE — Progress Notes (Signed)
Brooke Wood is here today for follow for radiation to right breast that completed 07/29/2012.  Patient denies having any pain. Patient reports having no issues with appetite.  Patient denies any issues with fatigue.  Patient states she might be getting a hysterectomy in near future as preventive measures. Vitals:   08/01/16 1128  BP: (!) 125/96  Pulse: 88  Resp: 20  Temp: 98.4 F (36.9 C)  TempSrc: Oral  SpO2: 100%  Weight: 158 lb (71.7 kg)    Wt Readings from Last 3 Encounters:  08/01/16 158 lb (71.7 kg)  05/16/16 155 lb 8 oz (70.5 kg)  04/05/16 156 lb 4 oz (70.9 kg)

## 2016-08-21 NOTE — Addendum Note (Signed)
Encounter addended by: Jacqulyn Liner, RN on: 08/21/2016  4:25 PM<BR>    Actions taken: Charge Capture section accepted

## 2016-09-16 MED FILL — TAMOXIFEN 20 MG TABLET: 20 | 90 days supply | Qty: 90 | Fill #3

## 2016-09-17 MED FILL — MELOXICAM 15 MG TABLET: 15 | 30 days supply | Qty: 30 | Fill #2

## 2016-09-24 MED FILL — POTASSIUM CL ER 20 MEQ TAB: 20 | 30 days supply | Qty: 90 | Fill #3

## 2016-10-16 ENCOUNTER — Telehealth: Payer: Self-pay | Admitting: Oncology

## 2016-10-16 MED FILL — VALSARTAN-HCTZ 160-25 MG TA: 160-25 | 90 days supply | Qty: 90 | Fill #1

## 2016-10-16 NOTE — Telephone Encounter (Signed)
Patient called and said her apartment is being renovated and it is "not liveable" due to fumes and dust.  She would like Dr. Sondra Come to write her a letter saying that she can't stay there so that her landlord will pay for her to stay in a motel.  Advised patient that we will not be able to provide her with a letter per Dr. Sondra Come.

## 2016-11-04 DIAGNOSIS — C50411 Malignant neoplasm of upper-outer quadrant of right female breast: Secondary | ICD-10-CM | POA: Diagnosis not present

## 2016-11-30 ENCOUNTER — Emergency Department (HOSPITAL_COMMUNITY)
Admission: EM | Admit: 2016-11-30 | Discharge: 2016-11-30 | Disposition: A | Discharge status: Home / Self Care | Payer: BLUE CROSS/BLUE SHIELD | Attending: Emergency Medicine | Admitting: Emergency Medicine

## 2016-11-30 ENCOUNTER — Encounter (HOSPITAL_COMMUNITY): Payer: Self-pay | Admitting: *Deleted

## 2016-11-30 DIAGNOSIS — I10 Essential (primary) hypertension: Secondary | ICD-10-CM | POA: Insufficient documentation

## 2016-11-30 DIAGNOSIS — Z79899 Other long term (current) drug therapy: Secondary | ICD-10-CM | POA: Diagnosis not present

## 2016-11-30 DIAGNOSIS — T63481A Toxic effect of venom of other arthropod, accidental (unintentional), initial encounter: Secondary | ICD-10-CM | POA: Insufficient documentation

## 2016-11-30 MED ORDER — HYDROXYZINE HCL 10 MG PO TABS: 10.0000 mg | ORAL_TABLET | Freq: Three times a day (TID) | ORAL | 0 refills | Status: DC | PRN

## 2016-11-30 MED ORDER — LORATADINE 10 MG PO TABS
10.0000 mg | ORAL_TABLET | Freq: Every day | ORAL | Status: DC
Start: 2016-11-30 — End: 2016-11-30
  Administered 2016-11-30: 10 mg via ORAL
  Filled 2016-11-30: qty 1

## 2016-11-30 MED ORDER — CETIRIZINE HCL 10 MG PO TABS: 10.0000 mg | ORAL_TABLET | Freq: Every day | ORAL | 0 refills | Status: DC

## 2016-11-30 MED ORDER — FAMOTIDINE 20 MG PO TABS
20.0000 mg | ORAL_TABLET | Freq: Once | ORAL | Status: AC
  Administered 2016-11-30: 20 mg via ORAL
  Filled 2016-11-30: qty 1

## 2016-11-30 MED ORDER — FAMOTIDINE 20 MG PO TABS: 20.0000 mg | ORAL_TABLET | Freq: Two times a day (BID) | ORAL | 0 refills | Status: DC

## 2016-11-30 MED ORDER — HYDROXYZINE HCL 10 MG PO TABS
10.0000 mg | ORAL_TABLET | Freq: Once | ORAL | Status: AC
  Administered 2016-11-30: 10 mg via ORAL
  Filled 2016-11-30: qty 1

## 2016-11-30 NOTE — ED Provider Notes (Signed)
[facility] DEPT Provider Note   CSN: [id number] Arrival date & time: 11/30/16  1216     History   Chief Complaint Chief Complaint  Patient presents with  . Pruritis    HPI Brooke Wood is a 51 y.o. female with PMH, HTN and breast cancer taking tamoxifen who presents to the ED with itching and redness to the left lower leg. Patient reports that she put lotion on her legs this morning and then went to work. While at work she noted itching and burning to the right lower leg. Patient denies difficulty swallowing or swelling of throat she denies wheezing or shortness of breath.  HPI  Past Medical History:  Diagnosis Date  . Allergy    Neg RAST 2007  . Breast cancer ([place])    right breast  . Hx of adenomatous colonic polyps 03/03/2014  . Hx of radiation therapy 06/15/12 - 07/29/12   right rbeast, 50.4 Gy x 28 fx, boost to cumulative dose 60.4 gray  . Hypertension   . Vocal cord dysfunction    Neg Methacholine challenge test 2007    Patient Active Problem List   Diagnosis Date Noted  . Acute left-sided low back pain with left-sided sciatica 06/27/2016  . Pain in left hip 06/27/2016  . Acute non-recurrent maxillary sinusitis 03/12/2016  . Lymphedema of upper extremity 11/10/2014  . Hx of adenomatous colonic polyps 03/03/2014  . Hypokalemia 10/29/2013  . Routine general medical examination at a health care facility 06/03/2013  . DJD (degenerative joint disease) of knee 11/24/2012  . Hyperlipidemia with target LDL less than 130 11/24/2012  . Malignant neoplasm of upper-outer quadrant of right breast in female, estrogen receptor positive ([place]) 03/06/2012  . Goiter 07/26/2009  . VOCAL CORD DISORDER 07/24/2009  . Essential hypertension, benign 01/19/2007  . Allergic rhinitis 01/19/2007    Past Surgical History:  Procedure Laterality Date  . BREAST LUMPECTOMY WITH SENTINEL LYMPH NODE BIOPSY  04/09/2012   Procedure: BREAST LUMPECTOMY WITH SENTINEL LYMPH NODE BX;  Surgeon: Merrie Roof, MD;  Location: [facility];  Service: General;  Laterality: Right;  . BREAST SURGERY  1985   rt br bx    OB History    Gravida Para Term Preterm AB Living   1 1 1     1    SAB TAB Ectopic Multiple Live Births                   Home Medications    Prior to Admission medications   Medication Sig Start Date End Date Taking? Authorizing Provider  cetirizine (ZYRTEC) 10 MG tablet Take 1 tablet (10 mg total) by mouth daily. 11/30/16   Ashley Murrain, NP  famotidine (PEPCID) 20 MG tablet Take 1 tablet (20 mg total) by mouth 2 (two) times daily. 11/30/16   Ashley Murrain, NP  hydrOXYzine (ATARAX/VISTARIL) 10 MG tablet Take 1 tablet (10 mg total) by mouth 3 (three) times daily as needed. 11/30/16   Ashley Murrain, NP  KLOR-CON M20 20 MEQ tablet TAKE 1 TABLET BY MOUTH 3 TIMES DAILY 01/02/16   [name], MD  tamoxifen (NOLVADEX) 20 MG tablet TAKE 1 TABLET BY MOUTH ONCE DAILY 07/11/15   Boelter, Genelle Gather, NP  valsartan-hydrochlorothiazide (DIOVAN-HCT) 160-25 MG tablet TAKE 1 TABLET BY MOUTH ONCE DAILY 06/11/16   [name], MD    Family History Family History  Problem Relation Age of Onset  . Lung cancer Father 58  smoker  . Cancer Father        lung  . Hypertension Other   . Alcohol abuse Neg Hx   . Diabetes Neg Hx   . Early death Neg Hx   . Hearing loss Neg Hx   . Heart disease Neg Hx   . Hyperlipidemia Neg Hx   . Stroke Neg Hx   . Kidney disease Neg Hx   . Colon cancer Neg Hx   . Breast cancer Neg Hx     Social History Social History  Substance Use Topics  . Smoking status: Never Smoker  . Smokeless tobacco: Never Used  . Alcohol use No     Allergies   Aspirin; Codeine; and Codeine phosphate   Review of Systems Review of Systems  HENT: Negative for congestion, sore throat and trouble swallowing.   Respiratory: Cough: dry.   Gastrointestinal: Negative for nausea and vomiting.  Musculoskeletal: Negative for neck pain.  Skin: Positive  for color change and rash.  Neurological: Negative for headaches.     Physical Exam Updated Vital Signs BP (!) 131/91 (BP Location: Left Arm)   Pulse 90   Temp 97.7 F (36.5 C) (Oral)   Resp 18   SpO2 100%   Physical Exam  Constitutional: She is oriented to person, place, and time. She appears well-developed and well-nourished. No distress.  HENT:  Head: Normocephalic and atraumatic.  Mouth/Throat: Uvula is midline and mucous membranes are normal. No posterior oropharyngeal edema or posterior oropharyngeal erythema.  Eyes: EOM are normal.  Neck: Neck supple.  Cardiovascular: Normal rate and regular rhythm.   Pulmonary/Chest: Effort normal and breath sounds normal.  Musculoskeletal: Normal range of motion.  Neurological: She is alert and oriented to person, place, and time. No cranial nerve deficit.  Skin:  Left lower anterior leg with area of swelling and erythema with tiny central area that appears as insect bite. No red streaking or drainage from the area.  Psychiatric: She has a normal mood and affect. Her behavior is normal.  Nursing note and vitals reviewed.    ED Treatments / Results  Labs (all labs ordered are listed, but only abnormal results are displayed) Labs Reviewed - No data to display  Radiology No results found.  Procedures Procedures (including critical care time)  Medications Ordered in ED Medications  loratadine (CLARITIN) tablet 10 mg (10 mg Oral Given 11/30/16 1312)  hydrOXYzine (ATARAX/VISTARIL) tablet 10 mg (10 mg Oral Given 11/30/16 1312)  famotidine (PEPCID) tablet 20 mg (20 mg Oral Given 11/30/16 1312)     Initial Impression / Assessment and Plan / ED Course  I have reviewed the triage vital signs and the nursing notes.  Final Clinical Impressions(s) / ED Diagnoses  51 y.o. female with itching and burning to the left lower leg stable for d/c without fever, red streaking, drainage or other signs of infection. Will treat for local reaction to  insect sting. Return precautions discussed.  Final diagnoses:  Local reaction to insect sting, accidental or unintentional, initial encounter    New Prescriptions Discharge Medication List as of 11/30/2016  1:04 PM    START taking these medications   Details  cetirizine (ZYRTEC) 10 MG tablet Take 1 tablet (10 mg total) by mouth daily., Starting Sat 11/30/2016, Print    famotidine (PEPCID) 20 MG tablet Take 1 tablet (20 mg total) by mouth 2 (two) times daily., Starting Sat 11/30/2016, Print    hydrOXYzine (ATARAX/VISTARIL) 10 MG tablet Take 1 tablet (10 mg total)  by mouth 3 (three) times daily as needed., Starting Sat 11/30/2016, Print         Diamond, [place], [place] 11/30/16 1335    Orlie Dakin, MD 11/30/16 [phone]

## 2016-11-30 NOTE — Discharge Instructions (Signed)
Take the medications as directed. If your swelling and redness increases, you develop red streaking up your leg, fever or other problems, return immediately.

## 2016-11-30 NOTE — ED Triage Notes (Signed)
Pt complains of itching and swelling to left leg since this morning. Pt believes she was bitten by an insect.

## 2016-12-05 DIAGNOSIS — N83209 Unspecified ovarian cyst, unspecified side: Secondary | ICD-10-CM | POA: Diagnosis not present

## 2016-12-05 DIAGNOSIS — Z6825 Body mass index (BMI) 25.0-25.9, adult: Secondary | ICD-10-CM | POA: Diagnosis not present

## 2016-12-10 MED FILL — POTASSIUM CL ER 20 MEQ TAB: 20 | 30 days supply | Qty: 90 | Fill #4

## 2016-12-10 MED FILL — TAMOXIFEN CITRATE 20 MG TAB: 20 | 90 days supply | Qty: 90 | Fill #4

## 2016-12-16 ENCOUNTER — Encounter: Payer: Self-pay | Admitting: Family Medicine

## 2016-12-16 ENCOUNTER — Ambulatory Visit (INDEPENDENT_AMBULATORY_CARE_PROVIDER_SITE_OTHER): Payer: BLUE CROSS/BLUE SHIELD | Admitting: Family Medicine

## 2016-12-16 VITALS — BP 138/80 | HR 90 | Temp 98.2°F | Ht 65.0 in | Wt 160.8 lb

## 2016-12-16 DIAGNOSIS — R21 Rash and other nonspecific skin eruption: Secondary | ICD-10-CM | POA: Diagnosis not present

## 2016-12-16 NOTE — Assessment & Plan Note (Signed)
It appears that it may be associated with bedbugs as she was staying with someone else. Does not appear to be infectious. -  Provided Rayos samples - Can use an antihistamine for itching. - Given indications to follow-up.

## 2016-12-16 NOTE — Progress Notes (Signed)
Brooke Wood - 51 y.o. female MRN [id number]  Date of birth: [birth year]  SUBJECTIVE:  Including CC & ROS.  Chief Complaint  Patient presents with  . Rash    Patient is here today C/O rash on arms.  The itching started at 1am.    Brooke Wood is a 51 year old female is presenting with rash. This is occurring on both arms, her chest, and her leg. She did not have this rash over the weekend. She woke up with this as she was staying at someone's house. She has not tried any medications for this. She denies any history of similar symptoms. They are itchy in nature. She denies any fevers. There is no streaking. She denies any exposure to new animals or plans. She denies any travel.     Review of Systems  Constitutional: Negative for fever.  Musculoskeletal: Negative for gait problem and joint swelling.  Skin: Positive for rash.  Neurological: Negative for weakness and numbness.    HISTORY: Past Medical, Surgical, Social, and Family History Reviewed & Updated per EMR.   Pertinent Historical Findings include:  Past Medical History:  Diagnosis Date  . Allergy    Neg RAST 2007  . Breast cancer ([place])    right breast  . Hx of adenomatous colonic polyps 03/03/2014  . Hx of radiation therapy 06/15/12 - 07/29/12   right rbeast, 50.4 Gy x 28 fx, boost to cumulative dose 60.4 gray  . Hypertension   . Vocal cord dysfunction    Neg Methacholine challenge test 2007    Past Surgical History:  Procedure Laterality Date  . BREAST LUMPECTOMY WITH SENTINEL LYMPH NODE BIOPSY  04/09/2012   Procedure: BREAST LUMPECTOMY WITH SENTINEL LYMPH NODE BX;  Surgeon: Merrie Roof, MD;  Location: [facility];  Service: General;  Laterality: Right;  . BREAST SURGERY  1985   rt br bx    Allergies  Allergen Reactions  . Aspirin Hives  . Codeine Other (See Comments)    "confusion"  . Codeine Phosphate     REACTION: unspecified    Family History  Problem Relation Age of Onset  . Lung cancer  Father 80       smoker  . Cancer Father        lung  . Hypertension Other   . Alcohol abuse Neg Hx   . Diabetes Neg Hx   . Early death Neg Hx   . Hearing loss Neg Hx   . Heart disease Neg Hx   . Hyperlipidemia Neg Hx   . Stroke Neg Hx   . Kidney disease Neg Hx   . Colon cancer Neg Hx   . Breast cancer Neg Hx      Social History   Social History  . Marital status: Single    Spouse name: N/A  . Number of children: N/A  . Years of education: N/A   Occupational History  .  Unemployed    [place] History Main Topics  . Smoking status: Never Smoker  . Smokeless tobacco: Never Used  . Alcohol use No  . Drug use: No  . Sexual activity: Not Currently    Birth control/ protection: None   Other Topics Concern  . Not on file   Social History Narrative   Regular exercise-No     PHYSICAL EXAM:  VS: BP 138/80 (BP Location: Left Arm, Patient Position: Sitting, Cuff Size: Normal)   Pulse 90   Temp 98.2 F (  36.8 C) (Oral)   Ht 5\' 5"  (1.651 m)   Wt 160 lb 12.8 oz (72.9 kg)   SpO2 98%   BMI 26.76 kg/m  Physical Exam Gen: NAD, alert, cooperative with exam,  ENT: normal lips, normal nasal mucosa,  Eye: normal EOM, normal conjunctiva and lids CV:  no edema, +2 pedal pulses   Resp: no accessory muscle use, non-labored,  Skin: Papular rashes with some redness but no streaking occurring on her arms chest, no areas of induration  Neuro: normal tone, normal sensation to touch Psych:  normal insight, alert and oriented MSK: Normal gait, normal strength      ASSESSMENT & PLAN:   Rash It appears that it may be associated with bedbugs as she was staying with someone else. Does not appear to be infectious. -  Provided Rayos samples - Can use an antihistamine for itching. - Given indications to follow-up.

## 2016-12-16 NOTE — Patient Instructions (Signed)
Thank you for coming in,   Please take the medication (Rayos) as directed.   Please take 6 pills the first day  Please take 5 pills the second day  Please take 4 pills the third day  Please take 3 pills the fourth day  Please take 2 pills the fifth day  Please take 1 pill the sixth day.   Please try taking zyrtec or allegra to help with the itching.    Please feel free to call with any questions or concerns at any time, at [phone]. --Dr. Mollie Germany Bedbugs are tiny bugs that live in and around beds. During the day, they stay hidden. At night, they come out and bite. Where are bedbugs found? Bedbugs can be found anywhere. It does not matter if a place is clean or dirty. They are often found in:  Hotels.  Shelters.  Dorms.  Hospitals.  Nursing homes.  Places where there are many birds or bats.  What are bedbug bites like? A bedbug bite leaves a small red bump with a darker red dot in the middle. The bump may show up soon after a person is bitten or a day or more later. Bedbug bites usually do not hurt, but they may itch. Most people do not need treatment for bedbug bites. The bumps usually go away on their own in a few days. How do I check for bedbugs? Bedbugs are reddish-brown, oval, and flat. They are very small and they cannot fly. Look for bedbugs in these places:  On mattresses, bed frames, headboards, and box springs.  On drapes and curtains in bedrooms.  Under the carpet in bedrooms.  Behind electrical outlets.  Behind any wallpaper that is peeling.  Inside luggage.  Also look for black or red spots or stains on or near the bed. What should I do if I find bedbugs? When Traveling Check your clothes, suitcase, and belongings for bedbugs before you go back home. You may want to throw away anything that has bedbugs on it. At Home Your bedroom may need to be treated by a pest control expert. You may also need to throw away mattresses or luggage. To  help keep bedbugs from coming back, you may want to:  Put a plastic cover over your mattress.  Wash your clothes and bedding in water that is hotter than 120F (48.9C). Dry them on a hot setting.  Vacuum often around the bed and in all of the cracks where the bugs might hide.  Check all used furniture, bedding, or clothes that you bring into your home.  Get rid of bird nests and bat roosts that are near your home.  In Your Bed Try wearing pajamas that have long sleeves and pant legs. Bedbugs usually bite areas of the skin that are not covered. This information is not intended to replace advice given to you by your health care provider. Make sure you discuss any questions you have with your health care provider. Document Released: 07/03/2010 Document Revised: 08/24/2015 Document Reviewed: 03/14/2014 Elsevier Interactive Patient Education  [employer].

## 2017-01-06 ENCOUNTER — Other Ambulatory Visit: Payer: Self-pay | Admitting: Oncology

## 2017-01-06 ENCOUNTER — Telehealth: Payer: Self-pay | Admitting: *Deleted

## 2017-01-06 ENCOUNTER — Ambulatory Visit (HOSPITAL_COMMUNITY)
Admission: RE | Admit: 2017-01-06 | Discharge: 2017-01-06 | Disposition: A | Discharge status: Home / Self Care | Payer: BLUE CROSS/BLUE SHIELD | Source: Ambulatory Visit | Attending: Oncology | Admitting: Oncology

## 2017-01-06 DIAGNOSIS — C50411 Malignant neoplasm of upper-outer quadrant of right female breast: Secondary | ICD-10-CM | POA: Insufficient documentation

## 2017-01-06 DIAGNOSIS — I890 Lymphedema, not elsewhere classified: Secondary | ICD-10-CM

## 2017-01-06 DIAGNOSIS — Z170 Estrogen receptor positive status [ER+]: Secondary | ICD-10-CM | POA: Diagnosis not present

## 2017-01-06 NOTE — Telephone Encounter (Signed)
This RN returned call to pt per her VM stating " I have a swollen right arm ".  Per call - Brooke Wood states onset noticed this weekend with swelling in hand as well.  No focal area of acute pain or redness-just discomfort at elbow joint when she stretches the arm out.  Per discussion informed pt of need for doppler study to evaluate for blood flow and then referral can be placed for therapy at the lymphedema clinic.  Brooke Wood verbalized understanding but stated she is at work and will get off at 2pm- and be able to take the bus to come for testing.  This RN obtained order and contacted the vascular dept to schedule- obtained a VM- message left for an urgent return call to schedule for today post 330 pm.  Return call for pt given as [phone].

## 2017-01-06 NOTE — Progress Notes (Signed)
VASCULAR LAB PRELIMINARY  PRELIMINARY  PRELIMINARY  PRELIMINARY  Right upper extremity venous duplex completed.    Preliminary report:  There is no DVT or SVT noted in the right upper extremity.  Interstitial fluid noted throughout.  Left message on Dr. Virgie Dad voicemail.   Anoushka Divito, RVT 01/06/2017, 4:18 PM

## 2017-01-06 NOTE — Telephone Encounter (Signed)
This RN obtained an appointment for doppler to be performed at [facility] at 4 pm today.  This RN called pt and left a detailed VM per her request per above.  Will follow up post above exam.

## 2017-01-07 ENCOUNTER — Other Ambulatory Visit: Payer: Self-pay | Admitting: Oncology

## 2017-01-07 ENCOUNTER — Telehealth: Payer: Self-pay | Admitting: *Deleted

## 2017-01-07 DIAGNOSIS — C50411 Malignant neoplasm of upper-outer quadrant of right female breast: Secondary | ICD-10-CM

## 2017-01-07 DIAGNOSIS — I890 Lymphedema, not elsewhere classified: Secondary | ICD-10-CM

## 2017-01-07 DIAGNOSIS — Z170 Estrogen receptor positive status [ER+]: Secondary | ICD-10-CM

## 2017-01-07 NOTE — Telephone Encounter (Signed)
This RN returned call to pt per doppler reading showing no DVT- discussed and referral placed for the lymphedema clinic PT.

## 2017-01-09 ENCOUNTER — Other Ambulatory Visit: Payer: Self-pay | Admitting: Obstetrics and Gynecology

## 2017-01-09 ENCOUNTER — Inpatient Hospital Stay (HOSPITAL_COMMUNITY)
Admission: RE | Admit: 2017-01-09 | Discharge: 2017-01-09 | Disposition: A | Discharge status: Home / Self Care | Payer: BLUE CROSS/BLUE SHIELD | Source: Ambulatory Visit

## 2017-01-09 NOTE — Patient Instructions (Addendum)
Your procedure is scheduled on:  Wednesday, Oct. 24 at 7:30 am  Enter through the Main Entrance of [facility] at:  6 am  Pick up the phone at the desk and dial [phone].  Call this number if you have problems the morning of surgery: [phone].  Remember: Do NOT eat food or drink clear liquids (including water) after midnight Tuesday, 10/23  Take these medicines the morning of surgery with a SIP OF WATER: tamoxifen  Do NOT wear jewelry (body piercing), metal hair clips/bobby pins, make-up, or nail polish. Do NOT wear lotions, powders, or perfumes.  You may wear deoderant. Do NOT shave for 48 hours prior to surgery. Do NOT bring valuables to the hospital.  Leave suitcase in car.  After surgery it may be brought to your room.  For patients admitted to the hospital, checkout time is 11:00 AM the day of discharge. Have a responsible adult drive you home and stay with you for 24 hours after your procedure.  Home with to be arranged prior to surgery.  Patient informed no taxi, no uber, no bus when d/c home.

## 2017-01-10 ENCOUNTER — Ambulatory Visit: Payer: BLUE CROSS/BLUE SHIELD | Admitting: Physical Therapy

## 2017-01-13 ENCOUNTER — Encounter (HOSPITAL_COMMUNITY)
Admission: RE | Admit: 2017-01-13 | Discharge: 2017-01-13 | Disposition: A | Discharge status: Home / Self Care | Payer: BLUE CROSS/BLUE SHIELD | Source: Ambulatory Visit | Attending: Obstetrics and Gynecology | Admitting: Obstetrics and Gynecology

## 2017-01-13 ENCOUNTER — Encounter (HOSPITAL_COMMUNITY): Payer: Self-pay

## 2017-01-13 ENCOUNTER — Other Ambulatory Visit: Payer: Self-pay

## 2017-01-13 DIAGNOSIS — Z01812 Encounter for preprocedural laboratory examination: Secondary | ICD-10-CM | POA: Insufficient documentation

## 2017-01-13 HISTORY — DX: Other specified postprocedural states: Z98.890

## 2017-01-13 HISTORY — DX: Other specified postprocedural states: R11.2

## 2017-01-13 LAB — BASIC METABOLIC PANEL
ANION GAP: 6 (ref 5–15)
BUN: 15 mg/dL (ref 6–20)
CALCIUM: 9.1 mg/dL (ref 8.9–10.3)
CO2: 26 mmol/L (ref 22–32)
CREATININE: 0.61 mg/dL (ref 0.44–1.00)
Chloride: 106 mmol/L (ref 101–111)
GLUCOSE: 99 mg/dL (ref 65–99)
Potassium: 4 mmol/L (ref 3.5–5.1)
Sodium: 138 mmol/L (ref 135–145)

## 2017-01-13 LAB — CBC
HEMATOCRIT: 40.1 % (ref 36.0–46.0)
Hemoglobin: 13.1 g/dL (ref 12.0–15.0)
MCH: 29 pg (ref 26.0–34.0)
MCHC: 32.7 g/dL (ref 30.0–36.0)
MCV: 88.7 fL (ref 78.0–100.0)
PLATELETS: 209 10*3/uL (ref 150–400)
RBC: 4.52 MIL/uL (ref 3.87–5.11)
RDW: 13.8 % (ref 11.5–15.5)
WBC: 7.1 10*3/uL (ref 4.0–10.5)

## 2017-01-13 LAB — TYPE AND SCREEN
ABO/RH(D): A POS
ANTIBODY SCREEN: NEGATIVE

## 2017-01-13 LAB — ABO/RH: ABO/RH(D): A POS

## 2017-01-13 NOTE — Pre-Procedure Instructions (Signed)
Reviewed patient's history, medication and EKG with Dr. Suzette Battiest.  No orders given.  [place] for surgery.

## 2017-01-15 ENCOUNTER — Encounter: Payer: Self-pay | Admitting: Physical Therapy

## 2017-01-15 ENCOUNTER — Ambulatory Visit: Payer: BLUE CROSS/BLUE SHIELD | Attending: Oncology | Admitting: Physical Therapy

## 2017-01-15 ENCOUNTER — Ambulatory Visit: Payer: BLUE CROSS/BLUE SHIELD | Admitting: Physical Therapy

## 2017-01-15 DIAGNOSIS — R293 Abnormal posture: Secondary | ICD-10-CM | POA: Diagnosis not present

## 2017-01-15 DIAGNOSIS — I890 Lymphedema, not elsewhere classified: Secondary | ICD-10-CM | POA: Diagnosis not present

## 2017-01-15 NOTE — Therapy (Signed)
[place], [place], [address] Phone: [phone]   Fax:  [phone]  Physical Therapy Evaluation  Patient Details  Name: Brooke Wood MRN: [id number] Date of Birth: [birth year] Referring Provider: Dr. Jana Hakim  Encounter Date: 01/15/2017      PT End of Session - 01/15/17 0938    Visit Number 1   Number of Visits 5   Date for PT Re-Evaluation 03/12/17   PT Start Time 0845   PT Stop Time 0925   PT Time Calculation (min) 40 min   Activity Tolerance Patient tolerated treatment well   Behavior During Therapy [facility] for tasks assessed/performed      Past Medical History:  Diagnosis Date  . Allergy    Neg RAST 2007  . Breast cancer ([place]) 2014   right breast  . Hx of adenomatous colonic polyps 03/03/2014  . Hx of radiation therapy 06/15/12 - 07/29/12   right rbeast, 50.4 Gy x 28 fx, boost to cumulative dose 60.4 gray  . Hypertension   . PONV (postoperative nausea and vomiting)   . SVD (spontaneous vaginal delivery)    x 1  . Vocal cord dysfunction    Neg Methacholine challenge test 2007    Past Surgical History:  Procedure Laterality Date  . BREAST LUMPECTOMY WITH SENTINEL LYMPH NODE BIOPSY  04/09/2012   Procedure: BREAST LUMPECTOMY WITH SENTINEL LYMPH NODE BX;  Surgeon: Merrie Roof, MD;  Location: [facility];  Service: General;  Laterality: Right;  . BREAST SURGERY  1985   rt br bx  . COLONOSCOPY  03/2014   poylps    There were no vitals filed for this visit.       Subjective Assessment - 01/15/17 0846    Subjective My swelling started about a week ago. It is swollen down in to my thumb. I don't have a glove. I need another sleeve and glove.    Pertinent History Right breast cancer diagnosed in 2014 with lumpectomy and SLNB; radiation completed 07/29/2013.  Pt. has had some swelling in the past and is known to this clinic.  HTN controlled with meds.   Had Doppler of knot on  right wrist recently, and this was negative., 01/22/17- pt is undergoing a partial hysterectomy                                                                            Patient Stated Goals get all the swelling out   Currently in Pain? No/denies   Pain Score 0-No pain            [facility] PT Assessment - 01/15/17 0001      Assessment   Medical Diagnosis right breast cancer   Referring Provider Dr. Jana Hakim   Onset Date/Surgical Date 04/09/12   Hand Dominance Right   Prior Therapy pt has been seen previously in this clinic for swelling 2 years ago     Precautions   Precautions Other (comment)   Precaution Comments lymphedema in RUE     Restrictions   Weight Bearing Restrictions No     [place] residence   Living Arrangements Alone   Type  of Home Apartment   Home Access Stairs to enter   Entrance Stairs-Number of Steps 6   Entrance Stairs-Rails Left   Home Layout Two level     Prior Function   Level of Independence Independent   Vocation Part time employment   Magazine features editor at [employer], have to pick up coffee pot to fill at sink   Leisure pt states she exercises when she thinks she needs to     Cognition   Overall Cognitive Status Within Functional Limits for tasks assessed     Observation/Other Assessments   Observations --   Other Surveys  --  LLIS: 51% impairment   Quick DASH  --     ROM / Strength   AROM / PROM / Strength AROM     AROM   Overall AROM  Within functional limits for tasks performed   Overall AROM Comments pt reports feeling very stiff when she gets up from seated position and is interested in a home exercise program   Right Shoulder Flexion --   Right Shoulder ABduction --   Right Shoulder Internal Rotation --   Right Shoulder External Rotation --           LYMPHEDEMA/ONCOLOGY QUESTIONNAIRE - 01/15/17 0903      Type   Cancer Type right breast cancer     Surgeries   Lumpectomy  Date 04/09/12   Sentinel Lymph Node Biopsy Date 04/09/12   Number Lymph Nodes Removed 2     Date Lymphedema/Swelling Started   Date 02/02/14     Treatment   Active Chemotherapy Treatment No   Past Chemotherapy Treatment No   Active Radiation Treatment No   Past Radiation Treatment Yes   Date 06/16/12   Body Site right chest and underarm   Current Hormone Treatment Yes   Drug Name Tamoxifen   Past Hormone Therapy No     What other symptoms do you have   Are you Having Heaviness or Tightness Yes   Are you having Pain No   Are you having pitting edema No   Is it Hard or Difficult finding clothes that fit No   Do you have infections No     Lymphedema Assessments   Lymphedema Assessments Upper extremities     Right Upper Extremity Lymphedema   15 cm Proximal to Olecranon Process 28.5 cm   10 cm Proximal to Olecranon Process 26.1 cm   Olecranon Process 23.2 cm   15 cm Proximal to Ulnar Styloid Process 22.2 cm   10 cm Proximal to Ulnar Styloid Process 18.5 cm   Just Proximal to Ulnar Styloid Process 15.6 cm   Across Hand at [employer] 17 cm   At [place] of 2nd Digit 5.5 cm   Other --     Left Upper Extremity Lymphedema   15 cm Proximal to Olecranon Process 29 cm   10 cm Proximal to Olecranon Process 25.8 cm   Olecranon Process 22.9 cm   15 cm Proximal to Ulnar Styloid Process 21.8 cm   10 cm Proximal to Ulnar Styloid Process 17.8 cm   Just Proximal to Ulnar Styloid Process 14.2 cm   Across Hand at [employer] 16.6 cm   At [place] of 2nd Digit 5.5 cm         Objective measurements completed on examination: See above findings.          [facility] Adult PT Treatment/Exercise - 01/15/17 0001  Manual Therapy   Edema Management cut piece of TG soft for pt to wear until she obtains compression garments                PT Education - 01/15/17 0947    Education provided Yes   Education Details anatomy of physiology of lymphatics, lymphedema risk reduction  practices   Person(s) Educated Patient   Methods Explanation;Handout   Comprehension Verbalized understanding           Short Term Clinic Goals - 02/22/14 1158      CC Short Term Goal  #1   Title pt will report at least a 25 percent decrease in pain in right arm.   Time 4   Period Weeks   Status On-going     CC Short Term Goal  #2   Title pt will have active right shoulder flexion of at least 140 degrees for more functional use of right arm   Time 4   Period Weeks   Status On-going     CC Short Term Goal  #3   Title pt will have active right shoulder abduction of at least 140 degrees for more functional use of right arm   Time 4   Period Weeks   Status On-going             Long Term Clinic Goals - 01/15/17 0944      CC Long Term Goal  #1   Title Pt will obtain appropriate compression garments including a sleeve and glove for long term management of edema   Time 8   Period Weeks   Status New   Target Date 03/12/17     CC Long Term Goal  #2   Title Pt will be able to independently verbalize lymphedema risk reduction practices   Time 8   Period Weeks   Status New   Target Date 03/12/17     CC Long Term Goal  #3   Title Pt will report a 75% improvement in right hand swelling to decrease risk of cellulitis   Time 8   Period Weeks   Status New   Target Date 03/12/17     CC Long Term Goal  #4   Title Pt will be independent in Strength ABC program for continued exercise to decrease stiffness and improve posture   Time 8   Period Weeks   Status New   Target Date 03/12/17             Plan - 01/15/17 4034    Clinical Impression Statement Pt presents to PT today with R UE lymphedema following a right lumpectomy and sentinel lymph node biopsy in 2014. Pt has been seen by this clinic previously and obtained compression garments and has been managing her swelling until about a week ago when she began having increased hand swelling and discomfort. At this  point her compression garments are two years old and most likely are not providing adequate compression. Pt was sent with information for obtaining new garments today and a prescription for garments was sent to pt's referring physician. Pt would benefit from skilled PT services for 1x/wk for 8 weeks (pt will be on hold for approx 6 weeks due to hysterectomy surgery next week) for management of RUE lymphedema and to instruct pt in a home exercise program that she can progress since she has not been exercising and feels very stiff. Instructed pt to call [facility] and be measured for a  sleeve prior to her surgery.    History and Personal Factors relevant to plan of care: right handed, has been seen previously for lymphedema   Clinical Presentation Evolving   Clinical Presentation due to: pt about to undergo a partial hysterectomy   Clinical Decision Making Moderate   Rehab Potential Good   Clinical Impairments Affecting Rehab Potential hx of radiaiton   PT Frequency 1x / week   PT Duration 8 weeks  pt will be on hold for approx 6 weeks due to surgery   PT Treatment/Interventions Manual lymph drainage;Patient/family education;DME Instruction;ADLs/Self Care Home Management;Therapeutic exercise;Compression bandaging;Vasopneumatic Device   PT Next Visit Plan instruct in self drainage, instruct in Strength ABC program once pt has healed and been cleared from surgery   PT Home Exercise Plan wear TG soft   Recommended Other Services compression sleeve and glove to be obtained   Consulted and Agree with Plan of Care Patient      Patient will benefit from skilled therapeutic intervention in order to improve the following deficits and impairments:  Increased edema, Pain, Decreased knowledge of use of DME, Decreased knowledge of precautions, Postural dysfunction  Visit Diagnosis: Lymphedema, not elsewhere classified - Plan: PT plan of care cert/re-cert  Abnormal posture - Plan: PT plan of care  cert/re-cert     Problem List Patient Active Problem List   Diagnosis Date Noted  . Rash 12/16/2016  . Acute left-sided low back pain with left-sided sciatica 06/27/2016  . Pain in left hip 06/27/2016  . Acute non-recurrent maxillary sinusitis 03/12/2016  . Lymphedema of upper extremity 11/10/2014  . Hx of adenomatous colonic polyps 03/03/2014  . Hypokalemia 10/29/2013  . Routine general medical examination at a health care facility 06/03/2013  . DJD (degenerative joint disease) of knee 11/24/2012  . Hyperlipidemia with target LDL less than 130 11/24/2012  . Malignant neoplasm of upper-outer quadrant of right breast in female, estrogen receptor positive ([place]) 03/06/2012  . Goiter 07/26/2009  . VOCAL CORD DISORDER 07/24/2009  . Essential hypertension, benign 01/19/2007  . Allergic rhinitis 01/19/2007    Allyson Sabal [facility] 01/15/2017, 10:29 AM  [place], [place], [address] Phone: [phone]   Fax:  [phone]  Name: Brooke Wood MRN: [id number] Date of Birth: [birth year]  Manus Gunning, PT 01/15/17 10:29 AM

## 2017-01-17 ENCOUNTER — Ambulatory Visit: Payer: BLUE CROSS/BLUE SHIELD | Admitting: Physical Therapy

## 2017-01-17 ENCOUNTER — Encounter: Payer: Self-pay | Admitting: Physical Therapy

## 2017-01-17 DIAGNOSIS — I890 Lymphedema, not elsewhere classified: Secondary | ICD-10-CM | POA: Diagnosis not present

## 2017-01-17 DIAGNOSIS — R293 Abnormal posture: Secondary | ICD-10-CM | POA: Diagnosis not present

## 2017-01-17 NOTE — Therapy (Addendum)
[place], [place], [address] Phone: [phone]   Fax:  [phone]  Physical Therapy Treatment  Patient Details  Name: Brooke Wood MRN: [id number] Date of Birth: 12-30-1965 Referring Provider: Dr. Jana Hakim  Encounter Date: 01/17/2017      PT End of Session - 01/17/17 1021    Visit Number 2   Number of Visits 5   Date for PT Re-Evaluation 03/12/17   PT Start Time 0935   PT Stop Time 1020   PT Time Calculation (min) 45 min   Activity Tolerance Patient tolerated treatment well   Behavior During Therapy [facility] for tasks assessed/performed      Past Medical History:  Diagnosis Date  . Allergy    Neg RAST 2007  . Breast cancer ([place]) 2014   right breast  . Hx of adenomatous colonic polyps 03/03/2014  . Hx of radiation therapy 06/15/12 - 07/29/12   right rbeast, 50.4 Gy x 28 fx, boost to cumulative dose 60.4 gray  . Hypertension   . PONV (postoperative nausea and vomiting)   . SVD (spontaneous vaginal delivery)    x 1  . Vocal cord dysfunction    Neg Methacholine challenge test 2007    Past Surgical History:  Procedure Laterality Date  . BREAST LUMPECTOMY WITH SENTINEL LYMPH NODE BIOPSY  04/09/2012   Procedure: BREAST LUMPECTOMY WITH SENTINEL LYMPH NODE BX;  Surgeon: Merrie Roof, MD;  Location: [facility];  Service: General;  Laterality: Right;  . BREAST SURGERY  1985   rt br bx  . COLONOSCOPY  03/2014   poylps    There were no vitals filed for this visit.      Subjective Assessment - 01/17/17 0938    Subjective They are supposed to call you from [place].    Pertinent History Right breast cancer diagnosed in 2014 with lumpectomy and SLNB; radiation completed 07/29/2013.  Pt. has had some swelling in the past and is known to this clinic.  HTN controlled with meds.   Had Doppler of knot on right wrist recently, and this was negative., 01/22/17- pt is undergoing a  partial hysterectomy                                                                            Patient Stated Goals get all the swelling out   Currently in Pain? No/denies   Pain Score 0-No pain                         [facility] Adult PT Treatment/Exercise - 01/17/17 0001      Manual Therapy   Manual Therapy Manual Lymphatic Drainage (MLD)   Manual therapy comments gave pt signed Rx to take to be measured for compression sleeve and glove, attempted to assist pt with making appt at DME provider but pt was unsure of her schedule tomorrow and stated she would call to make appt   Edema Management cut another piece of TG soft for pt to wear until she obtains compression garments   Manual Lymphatic Drainage (MLD) Instructed pt in the following and issued handout: in supine-  short neck,  5 diaphragmatic breaths, left axilla and establishment of interaxillary pathway, right inguinal nodes and establishment of axillo inguinal pathway, RUE working proximal to distal then retracing all steps while verbalizing correct techinque to patient                        Long Term Clinic Goals - 01/15/17 0944      CC Long Term Goal  #1   Title Pt will obtain appropriate compression garments including a sleeve and glove for long term management of edema   Time 8   Period Weeks   Status New   Target Date 03/12/17     CC Long Term Goal  #2   Title Pt will be able to independently verbalize lymphedema risk reduction practices   Time 8   Period Weeks   Status New   Target Date 03/12/17     CC Long Term Goal  #3   Title Pt will report a 75% improvement in right hand swelling to decrease risk of cellulitis   Time 8   Period Weeks   Status New   Target Date 03/12/17     CC Long Term Goal  #4   Title Pt will be independent in Strength ABC program for continued exercise to decrease stiffness and improve posture   Time 8   Period Weeks   Status New   Target Date 03/12/17             Plan - 01/17/17 1022    Clinical Impression Statement Instructed pt in self MLD for RUE today and issued handout while verbalizing correct technique and hand placment. Pt has not made appointment to be measured for compression garments. Attempted to assist pt with this but she was unsure of the bus schedule and how long her other appointments are so she was unable to schedule. She states she will call them while she is on her break today and schedule an appointment for tomorrow so she can get it done before her surgery. Gave pt copy of signed Rx from dr for pt's sleeve.    Rehab Potential Good   Clinical Impairments Affecting Rehab Potential hx of radiaiton   PT Frequency 1x / week   PT Duration 8 weeks  pt will be on hold for approx 6 wks due to surgery   PT Treatment/Interventions Manual lymph drainage;Patient/family education;DME Instruction;ADLs/Self Care Home Management;Therapeutic exercise;Compression bandaging;Vasopneumatic Device   PT Next Visit Plan assess indep self drainage, instruct in Strength ABC program once pt has healed and been cleared from surgery   PT Home Exercise Plan wear TG soft   Consulted and Agree with Plan of Care Patient      Patient will benefit from skilled therapeutic intervention in order to improve the following deficits and impairments:  Increased edema, Pain, Decreased knowledge of use of DME, Decreased knowledge of precautions, Postural dysfunction  Visit Diagnosis: Lymphedema, not elsewhere classified     Problem List Patient Active Problem List   Diagnosis Date Noted  . Rash 12/16/2016  . Acute left-sided low back pain with left-sided sciatica 06/27/2016  . Pain in left hip 06/27/2016  . Acute non-recurrent maxillary sinusitis 03/12/2016  . Lymphedema of upper extremity 11/10/2014  . Hx of adenomatous colonic polyps 03/03/2014  . Hypokalemia 10/29/2013  . Routine general medical examination at a health care facility 06/03/2013  .  DJD (degenerative joint disease) of knee 11/24/2012  . Hyperlipidemia with target LDL less than 130  11/24/2012  . Malignant neoplasm of upper-outer quadrant of right breast in female, estrogen receptor positive ([place]) 03/06/2012  . Goiter 07/26/2009  . VOCAL CORD DISORDER 07/24/2009  . Essential hypertension, benign 01/19/2007  . Allergic rhinitis 01/19/2007    Allyson Sabal [facility] 01/17/2017, 10:24 AM  [place], [place], [address] Phone: [phone]   Fax:  [phone]  Name: CASSIDY TASHIRO MRN: [id number] Date of Birth: [birth year]  Manus Gunning, PT 01/17/17 10:24 AM  PHYSICAL THERAPY DISCHARGE SUMMARY  Visits from Start of Care: 2  Current functional level related to goals / functional outcomes: See above   Remaining deficits: See above   Education / Equipment: See above  Plan: Patient agrees to discharge.  Patient goals were not met. Patient is being discharged due to not returning since the last visit.  ?????     Allyson Sabal [place], [place] 04/28/17 10:46 AM

## 2017-01-17 NOTE — Patient Instructions (Signed)

## 2017-01-21 ENCOUNTER — Encounter (HOSPITAL_COMMUNITY): Payer: Self-pay | Admitting: Anesthesiology

## 2017-01-21 NOTE — Anesthesia Preprocedure Evaluation (Addendum)
Anesthesia Evaluation  Patient identified by MRN, date of birth, ID band Patient awake    Reviewed: Allergy & Precautions, NPO status , Patient's Chart, lab work & pertinent test results  History of Anesthesia Complications (+) PONV  Airway Mallampati: I       Dental no notable dental hx. (+) Teeth Intact   Pulmonary neg pulmonary ROS,    Pulmonary exam normal breath sounds clear to auscultation       Cardiovascular hypertension, Pt. on medications Normal cardiovascular exam Rhythm:Regular Rate:Normal     Neuro/Psych negative neurological ROS  negative psych ROS   GI/Hepatic negative GI ROS, Neg liver ROS,   Endo/Other  negative endocrine ROS  Renal/GU negative Renal ROS  negative genitourinary   Musculoskeletal   Abdominal Normal abdominal exam  (+)   Peds  Hematology negative hematology ROS (+)   Anesthesia Other Findings   Reproductive/Obstetrics                            Anesthesia Physical Anesthesia Plan  ASA: II  Anesthesia Plan: General   Post-op Pain Management:    Induction: Intravenous  PONV Risk Score and Plan: 4 or greater and Ondansetron, Dexamethasone, Midazolam and Scopolamine patch - Pre-op  Airway Management Planned: Oral ETT  Additional Equipment:   Intra-op Plan:   Post-operative Plan: Extubation in OR  Informed Consent: I have reviewed the patients History and Physical, chart, labs and discussed the procedure including the risks, benefits and alternatives for the proposed anesthesia with the patient or authorized representative who has indicated his/her understanding and acceptance.   Dental advisory given  Plan Discussed with: CRNA and Surgeon  Anesthesia Plan Comments:        Anesthesia Quick Evaluation

## 2017-01-21 NOTE — H&P (Signed)
51 y.o. yo complains of persistent pelvic cyst without pain. Pt with hx of breast cancer. Previoulsy: "PELVIC 5 cm cyst unchanged. Uterus same.  Surgery scheduled for October 4th wed at 0730. Needs day off before surgery. TLH/LSO/salpingectomies- possible RO.". Previous two [place] : Pelvic- EM 0.32 cm. LO simple cyst now 4.9 cm. Normal blood flow and no ff. Pelvic 5x4x5; EM 0.47 cm; 2.9 LO simple cyst. CA 125 was 17. Past Medical History:  Diagnosis Date  . Allergy    Neg RAST 2007  . Breast cancer ([place]) 2014   right breast  . Hx of adenomatous colonic polyps 03/03/2014  . Hx of radiation therapy 06/15/12 - 07/29/12   right rbeast, 50.4 Gy x 28 fx, boost to cumulative dose 60.4 gray  . Hypertension   . PONV (postoperative nausea and vomiting)   . SVD (spontaneous vaginal delivery)    x 1  . Vocal cord dysfunction    Neg Methacholine challenge test 2007   Past Surgical History:  Procedure Laterality Date  . BREAST LUMPECTOMY WITH SENTINEL LYMPH NODE BIOPSY  04/09/2012   Procedure: BREAST LUMPECTOMY WITH SENTINEL LYMPH NODE BX;  Surgeon: Merrie Roof, MD;  Location: [facility];  Service: General;  Laterality: Right;  . BREAST SURGERY  1985   rt br bx  . COLONOSCOPY  03/2014   poylps    Social History   Social History  . Marital status: Single    Spouse name: N/A  . Number of children: N/A  . Years of education: N/A   Occupational History  .  Unemployed    [place] History Main Topics  . Smoking status: Never Smoker  . Smokeless tobacco: Never Used  . Alcohol use No  . Drug use: No  . Sexual activity: Not Currently    Birth control/ protection: None   Other Topics Concern  . Not on file   Social History Narrative   Regular exercise-No    No current facility-administered medications on file prior to encounter.    Current Outpatient Prescriptions on File Prior to Encounter  Medication Sig Dispense Refill  . KLOR-CON M20 20 MEQ tablet  TAKE 1 TABLET BY MOUTH 3 TIMES DAILY (Patient taking differently: TAKE 1 TABLET BY MOUTH DAILY) 90 tablet 5  . tamoxifen (NOLVADEX) 20 MG tablet TAKE 1 TABLET BY MOUTH ONCE DAILY 90 tablet 3  . valsartan-hydrochlorothiazide (DIOVAN-HCT) 160-25 MG tablet TAKE 1 TABLET BY MOUTH ONCE DAILY (Patient taking differently: TAKE 1 TABLET BY MOUTH ONCE DAILY AT NIGHT) 90 tablet 1    Allergies  Allergen Reactions  . Aspirin Hives  . Codeine Other (See Comments)    "confusion"    @VITALS2 @  Lungs: clear to ascultation Cor:  RRR Abdomen:  soft, nontender, nondistended. Ex:  no cords, erythema Pelvic:   . Vulva: no masses, no atrophy, no lesions\ls1   . Vagina: no tenderness, no erythema, no abnormal vaginal discharge, no vesicle(s) or ulcers, no cystocele, no rectocele\ls1   . Cervix: grossly normal, no discharge, no cervical motion tenderness\ls1   . Uterus: normal size (7), normal shape, midline, no uterine prolapse, non-tender\ls1 . Bladder/Urethra: normal meatus, no urethral discharge, no urethral mass, bladder non distended, Urethra well supported\ls1   . Adnexa/Parametria: no parametrial tenderness, no parametrial mass, no adnexal tenderness, no ovarian mass  A:  Pt with hx of breast cancer and concerned about ovarian cancer.  Has stable 5 cm cyst but desires removal.  Will do  robotic TLH/salpingectomies/LSO, possible RSO, cysto.  Her oncologist agrees with plan.     P: All risks, benefits and alternatives d/w patient and she desires to proceed.  Patient has undergone a modified bowel prep and will receive preop antibiotics and SCDs during the operation.     Divya Munshi A

## 2017-01-22 ENCOUNTER — Encounter (HOSPITAL_COMMUNITY)
Admission: RE | Disposition: A | Discharge status: Home / Self Care | Payer: Self-pay | Source: Ambulatory Visit | Attending: Obstetrics and Gynecology

## 2017-01-22 ENCOUNTER — Observation Stay (HOSPITAL_COMMUNITY)
Admission: RE | Admit: 2017-01-22 | Discharge: 2017-01-23 | Disposition: A | Discharge status: Home / Self Care | Payer: BLUE CROSS/BLUE SHIELD | Source: Ambulatory Visit | Attending: Obstetrics and Gynecology | Admitting: Obstetrics and Gynecology

## 2017-01-22 ENCOUNTER — Ambulatory Visit (HOSPITAL_COMMUNITY): Payer: BLUE CROSS/BLUE SHIELD | Admitting: Anesthesiology

## 2017-01-22 ENCOUNTER — Encounter (HOSPITAL_COMMUNITY): Payer: Self-pay | Admitting: *Deleted

## 2017-01-22 DIAGNOSIS — I10 Essential (primary) hypertension: Secondary | ICD-10-CM | POA: Diagnosis not present

## 2017-01-22 DIAGNOSIS — D259 Leiomyoma of uterus, unspecified: Secondary | ICD-10-CM | POA: Insufficient documentation

## 2017-01-22 DIAGNOSIS — N83202 Unspecified ovarian cyst, left side: Secondary | ICD-10-CM | POA: Diagnosis not present

## 2017-01-22 DIAGNOSIS — Z853 Personal history of malignant neoplasm of breast: Secondary | ICD-10-CM | POA: Insufficient documentation

## 2017-01-22 DIAGNOSIS — N801 Endometriosis of ovary: Secondary | ICD-10-CM | POA: Diagnosis not present

## 2017-01-22 DIAGNOSIS — N800 Endometriosis of uterus: Secondary | ICD-10-CM | POA: Insufficient documentation

## 2017-01-22 DIAGNOSIS — Z23 Encounter for immunization: Secondary | ICD-10-CM | POA: Diagnosis not present

## 2017-01-22 DIAGNOSIS — Z886 Allergy status to analgesic agent status: Secondary | ICD-10-CM | POA: Diagnosis not present

## 2017-01-22 DIAGNOSIS — C50411 Malignant neoplasm of upper-outer quadrant of right female breast: Secondary | ICD-10-CM | POA: Diagnosis not present

## 2017-01-22 DIAGNOSIS — E785 Hyperlipidemia, unspecified: Secondary | ICD-10-CM | POA: Diagnosis not present

## 2017-01-22 DIAGNOSIS — R8569 Abnormal cytological findings in specimens from other digestive organs and abdominal cavity: Secondary | ICD-10-CM | POA: Diagnosis not present

## 2017-01-22 DIAGNOSIS — N72 Inflammatory disease of cervix uteri: Secondary | ICD-10-CM | POA: Diagnosis not present

## 2017-01-22 DIAGNOSIS — Z79899 Other long term (current) drug therapy: Secondary | ICD-10-CM | POA: Diagnosis not present

## 2017-01-22 DIAGNOSIS — Z9889 Other specified postprocedural states: Secondary | ICD-10-CM

## 2017-01-22 DIAGNOSIS — N83209 Unspecified ovarian cyst, unspecified side: Secondary | ICD-10-CM | POA: Diagnosis not present

## 2017-01-22 HISTORY — PX: ROBOTIC ASSISTED TOTAL HYSTERECTOMY WITH BILATERAL SALPINGO OOPHERECTOMY: SHX6086

## 2017-01-22 HISTORY — PX: CYSTOSCOPY: SHX5120

## 2017-01-22 LAB — PREGNANCY, URINE: Preg Test, Ur: NEGATIVE

## 2017-01-22 SURGERY — ROBOTIC ASSISTED TOTAL HYSTERECTOMY WITH BILATERAL SALPINGO OOPHORECTOMY
Anesthesia: General | Site: Bladder

## 2017-01-22 MED ORDER — PROPOFOL 10 MG/ML IV BOLUS
INTRAVENOUS | Status: AC
  Filled 2017-01-22: qty 20

## 2017-01-22 MED ORDER — CEFAZOLIN SODIUM-DEXTROSE 2-3 GM-%(50ML) IV SOLR
INTRAVENOUS | Status: AC
  Filled 2017-01-22: qty 50

## 2017-01-22 MED ORDER — CEFAZOLIN SODIUM-DEXTROSE 2-4 GM/100ML-% IV SOLN
2.0000 g | INTRAVENOUS | Status: AC
  Administered 2017-01-22: 2 g via INTRAVENOUS

## 2017-01-22 MED ORDER — GLYCOPYRROLATE 0.2 MG/ML IJ SOLN
INTRAMUSCULAR | Status: DC | PRN
  Administered 2017-01-22: .1 mg via INTRAVENOUS

## 2017-01-22 MED ORDER — GLYCOPYRROLATE 0.2 MG/ML IJ SOLN
INTRAMUSCULAR | Status: AC
  Filled 2017-01-22: qty 1

## 2017-01-22 MED ORDER — KETOROLAC TROMETHAMINE 30 MG/ML IJ SOLN: 30.0000 mg | Freq: Four times a day (QID) | INTRAMUSCULAR | Status: DC

## 2017-01-22 MED ORDER — ROCURONIUM BROMIDE 100 MG/10ML IV SOLN
INTRAVENOUS | Status: DC | PRN
  Administered 2017-01-22: 10 mg via INTRAVENOUS
  Administered 2017-01-22: 40 mg via INTRAVENOUS

## 2017-01-22 MED ORDER — KETOROLAC TROMETHAMINE 30 MG/ML IJ SOLN
INTRAMUSCULAR | Status: AC
  Filled 2017-01-22: qty 1

## 2017-01-22 MED ORDER — INFLUENZA VAC SPLIT QUAD 0.5 ML IM SUSY
0.5000 mL | PREFILLED_SYRINGE | INTRAMUSCULAR | Status: AC
  Administered 2017-01-23: 0.5 mL via INTRAMUSCULAR
  Filled 2017-01-22: qty 0.5

## 2017-01-22 MED ORDER — SODIUM CHLORIDE 0.9 % IJ SOLN
INTRAMUSCULAR | Status: AC
  Filled 2017-01-22: qty 10

## 2017-01-22 MED ORDER — SCOPOLAMINE 1 MG/3DAYS TD PT72
MEDICATED_PATCH | TRANSDERMAL | Status: AC
  Filled 2017-01-22: qty 1

## 2017-01-22 MED ORDER — MEPERIDINE HCL 25 MG/ML IJ SOLN: 6.2500 mg | INTRAMUSCULAR | Status: DC | PRN

## 2017-01-22 MED ORDER — MIDAZOLAM HCL 2 MG/2ML IJ SOLN
INTRAMUSCULAR | Status: AC
  Filled 2017-01-22: qty 2

## 2017-01-22 MED ORDER — MENTHOL 3 MG MT LOZG: 1.0000 | LOZENGE | OROMUCOSAL | Status: DC | PRN

## 2017-01-22 MED ORDER — LACTATED RINGERS IV SOLN
INTRAVENOUS | Status: DC
  Administered 2017-01-22 (×2): via INTRAVENOUS

## 2017-01-22 MED ORDER — PHENYLEPHRINE 40 MCG/ML (10ML) SYRINGE FOR IV PUSH (FOR BLOOD PRESSURE SUPPORT)
PREFILLED_SYRINGE | INTRAVENOUS | Status: AC
  Filled 2017-01-22: qty 10

## 2017-01-22 MED ORDER — SODIUM CHLORIDE 0.9 % IJ SOLN
INTRAMUSCULAR | Status: AC
  Filled 2017-01-22: qty 20

## 2017-01-22 MED ORDER — STERILE WATER FOR IRRIGATION IR SOLN
Status: DC | PRN
  Administered 2017-01-22: 1000 mL

## 2017-01-22 MED ORDER — NEOSTIGMINE METHYLSULFATE 10 MG/10ML IV SOLN
INTRAVENOUS | Status: DC | PRN
  Administered 2017-01-22: 4 mg via INTRAVENOUS

## 2017-01-22 MED ORDER — LIDOCAINE HCL (CARDIAC) 20 MG/ML IV SOLN
INTRAVENOUS | Status: AC
  Filled 2017-01-22: qty 5

## 2017-01-22 MED ORDER — TAMOXIFEN CITRATE 10 MG PO TABS
20.0000 mg | ORAL_TABLET | Freq: Every day | ORAL | Status: DC
  Administered 2017-01-22 – 2017-01-23 (×2): 20 mg via ORAL
  Filled 2017-01-22 (×3): qty 2

## 2017-01-22 MED ORDER — ONDANSETRON HCL 4 MG/2ML IJ SOLN
INTRAMUSCULAR | Status: AC
  Filled 2017-01-22: qty 2

## 2017-01-22 MED ORDER — MIDAZOLAM HCL 2 MG/2ML IJ SOLN
INTRAMUSCULAR | Status: DC | PRN
  Administered 2017-01-22: 2 mg via INTRAVENOUS

## 2017-01-22 MED ORDER — ROPIVACAINE HCL 5 MG/ML IJ SOLN
INTRAMUSCULAR | Status: AC
  Filled 2017-01-22: qty 30

## 2017-01-22 MED ORDER — SODIUM CHLORIDE 0.9 % IV SOLN
INTRAVENOUS | Status: DC | PRN
  Administered 2017-01-22: 60 mL

## 2017-01-22 MED ORDER — PROPOFOL 10 MG/ML IV BOLUS
INTRAVENOUS | Status: DC | PRN
  Administered 2017-01-22: 20 mg via INTRAVENOUS
  Administered 2017-01-22: 180 mg via INTRAVENOUS

## 2017-01-22 MED ORDER — VALSARTAN-HYDROCHLOROTHIAZIDE 160-25 MG PO TABS: 1.0000 | ORAL_TABLET | Freq: Every day | ORAL | Status: DC

## 2017-01-22 MED ORDER — LACTATED RINGERS IR SOLN
Status: DC | PRN
Start: 2017-01-22 — End: 2017-01-22
  Administered 2017-01-22: 3000 mL

## 2017-01-22 MED ORDER — DEXAMETHASONE SODIUM PHOSPHATE 10 MG/ML IJ SOLN
INTRAMUSCULAR | Status: AC
  Filled 2017-01-22: qty 1

## 2017-01-22 MED ORDER — OXYCODONE-ACETAMINOPHEN 5-325 MG PO TABS: 1.0000 | ORAL_TABLET | ORAL | Status: DC | PRN

## 2017-01-22 MED ORDER — SUGAMMADEX SODIUM 200 MG/2ML IV SOLN
INTRAVENOUS | Status: AC
  Filled 2017-01-22: qty 2

## 2017-01-22 MED ORDER — SODIUM CHLORIDE 0.9 % IJ SOLN
INTRAMUSCULAR | Status: DC | PRN
  Administered 2017-01-22: 10 mL

## 2017-01-22 MED ORDER — HYDROCHLOROTHIAZIDE 25 MG PO TABS
25.0000 mg | ORAL_TABLET | Freq: Every day | ORAL | Status: DC
  Administered 2017-01-22 – 2017-01-23 (×2): 25 mg via ORAL
  Filled 2017-01-22 (×2): qty 1

## 2017-01-22 MED ORDER — FENTANYL CITRATE (PF) 100 MCG/2ML IJ SOLN
INTRAMUSCULAR | Status: DC | PRN
  Administered 2017-01-22 (×2): 25 ug via INTRAVENOUS
  Administered 2017-01-22 (×2): 100 ug via INTRAVENOUS

## 2017-01-22 MED ORDER — POTASSIUM CHLORIDE CRYS ER 20 MEQ PO TBCR
20.0000 meq | EXTENDED_RELEASE_TABLET | Freq: Every day | ORAL | Status: DC
  Administered 2017-01-22 – 2017-01-23 (×2): 20 meq via ORAL
  Filled 2017-01-22 (×3): qty 1

## 2017-01-22 MED ORDER — KETOROLAC TROMETHAMINE 30 MG/ML IJ SOLN
INTRAMUSCULAR | Status: DC | PRN
  Administered 2017-01-22: 30 mg via INTRAVENOUS

## 2017-01-22 MED ORDER — ONDANSETRON HCL 4 MG/2ML IJ SOLN
INTRAMUSCULAR | Status: DC | PRN
  Administered 2017-01-22: 40 mg via INTRAVENOUS

## 2017-01-22 MED ORDER — ONDANSETRON HCL 4 MG/2ML IJ SOLN: 4.0000 mg | Freq: Four times a day (QID) | INTRAMUSCULAR | Status: DC | PRN

## 2017-01-22 MED ORDER — SUGAMMADEX SODIUM 200 MG/2ML IV SOLN
INTRAVENOUS | Status: DC | PRN
  Administered 2017-01-22: 200 mg via INTRAVENOUS

## 2017-01-22 MED ORDER — LACTATED RINGERS IV SOLN: INTRAVENOUS | Status: DC

## 2017-01-22 MED ORDER — KETOROLAC TROMETHAMINE 30 MG/ML IJ SOLN: 30.0000 mg | Freq: Once | INTRAMUSCULAR | Status: DC | PRN

## 2017-01-22 MED ORDER — ROCURONIUM BROMIDE 100 MG/10ML IV SOLN
INTRAVENOUS | Status: AC
  Filled 2017-01-22: qty 1

## 2017-01-22 MED ORDER — HYDROMORPHONE HCL 1 MG/ML IJ SOLN
INTRAMUSCULAR | Status: AC
  Filled 2017-01-22: qty 0.5

## 2017-01-22 MED ORDER — PROMETHAZINE HCL 25 MG/ML IJ SOLN: 6.2500 mg | INTRAMUSCULAR | Status: DC | PRN

## 2017-01-22 MED ORDER — FENTANYL CITRATE (PF) 250 MCG/5ML IJ SOLN
INTRAMUSCULAR | Status: AC
  Filled 2017-01-22: qty 5

## 2017-01-22 MED ORDER — LORATADINE 10 MG PO TABS
10.0000 mg | ORAL_TABLET | Freq: Every day | ORAL | Status: DC
  Filled 2017-01-22 (×3): qty 1

## 2017-01-22 MED ORDER — ONDANSETRON HCL 4 MG PO TABS: 4.0000 mg | ORAL_TABLET | Freq: Four times a day (QID) | ORAL | Status: DC | PRN

## 2017-01-22 MED ORDER — IBUPROFEN 800 MG PO TABS
800.0000 mg | ORAL_TABLET | Freq: Three times a day (TID) | ORAL | Status: DC | PRN
  Administered 2017-01-22 – 2017-01-23 (×2): 800 mg via ORAL
  Filled 2017-01-22 (×2): qty 1

## 2017-01-22 MED ORDER — SCOPOLAMINE 1 MG/3DAYS TD PT72
1.0000 | MEDICATED_PATCH | Freq: Once | TRANSDERMAL | Status: DC
  Administered 2017-01-22: 1.5 mg via TRANSDERMAL

## 2017-01-22 MED ORDER — DEXAMETHASONE SODIUM PHOSPHATE 10 MG/ML IJ SOLN
INTRAMUSCULAR | Status: DC | PRN
  Administered 2017-01-22: 10 mg via INTRAVENOUS

## 2017-01-22 MED ORDER — LIDOCAINE HCL (CARDIAC) 20 MG/ML IV SOLN
INTRAVENOUS | Status: DC | PRN
  Administered 2017-01-22: 100 mg via INTRAVENOUS

## 2017-01-22 MED ORDER — IRBESARTAN 150 MG PO TABS
150.0000 mg | ORAL_TABLET | Freq: Every day | ORAL | Status: DC
  Administered 2017-01-22 – 2017-01-23 (×2): 150 mg via ORAL
  Filled 2017-01-22 (×3): qty 1

## 2017-01-22 MED ORDER — HYDROMORPHONE HCL 1 MG/ML IJ SOLN
0.2500 mg | INTRAMUSCULAR | Status: DC | PRN
  Administered 2017-01-22 (×2): 0.25 mg via INTRAVENOUS

## 2017-01-22 SURGICAL SUPPLY — 60 items
ADH SKN CLS APL DERMABOND .7 (GAUZE/BANDAGES/DRESSINGS) ×2
BARRIER ADHS 3X4 INTERCEED (GAUZE/BANDAGES/DRESSINGS) IMPLANT
BRR ADH 4X3 ABS CNTRL BYND (GAUZE/BANDAGES/DRESSINGS)
CABLE HIGH FREQUENCY MONO STRZ (ELECTRODE) ×1 IMPLANT
CANISTER SUCT 3000ML PPV (MISCELLANEOUS) ×3 IMPLANT
CATH FOLEY 2WAY SLVR  5CC 16FR (CATHETERS) ×1
CATH FOLEY 2WAY SLVR 5CC 16FR (CATHETERS) ×2 IMPLANT
CLOTH BEACON ORANGE TIMEOUT ST (SAFETY) ×3 IMPLANT
CONT PATH 16OZ SNAP LID 3702 (MISCELLANEOUS) ×3 IMPLANT
COVER BACK TABLE 60X90IN (DRAPES) ×6 IMPLANT
COVER TIP SHEARS 8 DVNC (MISCELLANEOUS) ×2 IMPLANT
COVER TIP SHEARS 8MM DA VINCI (MISCELLANEOUS) ×1
DECANTER SPIKE VIAL GLASS SM (MISCELLANEOUS) ×6 IMPLANT
DEFOGGER SCOPE WARMER CLEARIFY (MISCELLANEOUS) ×3 IMPLANT
DERMABOND ADVANCED (GAUZE/BANDAGES/DRESSINGS) ×1
DERMABOND ADVANCED .7 DNX12 (GAUZE/BANDAGES/DRESSINGS) ×2 IMPLANT
DRSG OPSITE POSTOP 3X4 (GAUZE/BANDAGES/DRESSINGS) ×3 IMPLANT
DURAPREP 26ML APPLICATOR (WOUND CARE) ×3 IMPLANT
ELECT REM PT RETURN 9FT ADLT (ELECTROSURGICAL) ×3
ELECTRODE REM PT RTRN 9FT ADLT (ELECTROSURGICAL) ×2 IMPLANT
GLOVE BIO SURGEON STRL SZ7 (GLOVE) ×9 IMPLANT
GLOVE BIOGEL PI IND STRL 7.0 (GLOVE) ×4 IMPLANT
GLOVE BIOGEL PI INDICATOR 7.0 (GLOVE) ×2
GLOVE SURG SS PI 7.0 STRL IVOR (GLOVE) ×1 IMPLANT
GOWN STRL REUS W/TWL LRG LVL3 (GOWN DISPOSABLE) ×6 IMPLANT
KIT ACCESSORY DA VINCI DISP (KITS) ×1
KIT ACCESSORY DVNC DISP (KITS) ×2 IMPLANT
LEGGING LITHOTOMY PAIR STRL (DRAPES) ×4 IMPLANT
MANIPULATOR ADVINCU DEL 3.0 PL (MISCELLANEOUS) ×1 IMPLANT
MANIPULATOR ADVINCU DEL 3.5 PL (MISCELLANEOUS) IMPLANT
MANIPULATOR ADVINCU DEL 4.0 PL (MISCELLANEOUS) IMPLANT
NEEDLE INSUFFLATION 120MM (ENDOMECHANICALS) ×3 IMPLANT
OCCLUDER COLPOPNEUMO (BALLOONS) ×1 IMPLANT
PACK ROBOT WH (CUSTOM PROCEDURE TRAY) ×3 IMPLANT
PACK ROBOTIC GOWN (GOWN DISPOSABLE) ×3 IMPLANT
PACK TRENDGUARD 450 HYBRID PRO (MISCELLANEOUS) IMPLANT
PAD PREP 24X48 CUFFED NSTRL (MISCELLANEOUS) ×3 IMPLANT
POUCH LAPAROSCOPIC INSTRUMENT (MISCELLANEOUS) ×1 IMPLANT
PROTECTOR NERVE ULNAR (MISCELLANEOUS) ×6 IMPLANT
SCISSORS LAP 5X35 DISP (ENDOMECHANICALS) ×1 IMPLANT
SET CYSTO W/LG BORE CLAMP LF (SET/KITS/TRAYS/PACK) ×3 IMPLANT
SET IRRIG TUBING LAPAROSCOPIC (IRRIGATION / IRRIGATOR) ×3 IMPLANT
SET TRI-LUMEN FLTR TB AIRSEAL (TUBING) ×3 IMPLANT
SUT DVC VLOC 180 0 12IN GS21 (SUTURE) ×3
SUT VIC AB 2-0 CT2 27 (SUTURE) ×6 IMPLANT
SUT VIC AB 2-0 UR6 27 (SUTURE) ×3 IMPLANT
SUT VICRYL RAPIDE 3 0 (SUTURE) ×6 IMPLANT
SUT VLOC 180 0 9IN  GS21 (SUTURE)
SUT VLOC 180 0 9IN GS21 (SUTURE) IMPLANT
SUTURE DVC VLC 180 0 12IN GS21 (SUTURE) IMPLANT
SYR 50ML LL SCALE MARK (SYRINGE) ×3 IMPLANT
TIP RUMI ORANGE 6.7MMX12CM (TIP) IMPLANT
TOWEL OR 17X24 6PK STRL BLUE (TOWEL DISPOSABLE) ×9 IMPLANT
TRENDGUARD 450 HYBRID PRO PACK (MISCELLANEOUS) ×3
TROCAR DILATING TIP 12MM 150MM (ENDOMECHANICALS) ×3 IMPLANT
TROCAR DISP BLADELESS 8 DVNC (TROCAR) ×2 IMPLANT
TROCAR DISP BLADELESS 8MM (TROCAR) ×1
TROCAR PORT AIRSEAL 5X120 (TROCAR) ×1 IMPLANT
TROCAR PORT AIRSEAL 8X120 (TROCAR) IMPLANT
WATER STERILE IRR 1000ML POUR (IV SOLUTION) ×3 IMPLANT

## 2017-01-22 NOTE — Anesthesia Procedure Notes (Addendum)
Procedure Name: Intubation Date/Time: 01/22/2017 7:35 AM Performed by: Lyn Hollingshead Pre-anesthesia Checklist: Patient identified, Emergency Drugs available, Suction available, Patient being monitored and Timeout performed Patient Re-evaluated:Patient Re-evaluated prior to induction Oxygen Delivery Method: Circle system utilized and Simple face mask Preoxygenation: Pre-oxygenation with 100% oxygen Induction Type: IV induction Ventilation: Mask ventilation without difficulty Laryngoscope Size: Miller, Mac, 3 and 4 Grade View: Grade IV Tube type: Oral Tube size: 7.0 mm Number of attempts: 3 Airway Equipment and Method: Video-laryngoscopy Placement Confirmation: ETT inserted through vocal cords under direct vision,  positive ETCO2 and breath sounds checked- equal and bilateral Secured at: 20 cm Tube secured with: Tape Difficulty Due To: Difficulty was unanticipated and Difficult Airway- due to immobile epiglottis Future Recommendations: Recommend- induction with short-acting agent, and alternative techniques readily available

## 2017-01-22 NOTE — Transfer of Care (Signed)
Immediate Anesthesia Transfer of Care Note  Patient: Brooke Wood  Procedure(s) Performed: ROBOTIC ASSISTED TOTAL HYSTERECTOMY WITH BILATERAL SALPINGECTOMY AND LEFT OOPHORECTOMY (Bilateral Abdomen) CYSTOSCOPY (N/A Bladder)  Patient Location: PACU  Anesthesia Type:General  Level of Consciousness: sedated  Airway & Oxygen Therapy: Patient Spontanous Breathing and Patient connected to nasal cannula oxygen  Post-op Assessment: Report given to RN  Post vital signs: Reviewed and stable  Last Vitals:  Vitals:   01/22/17 0610  BP: (!) 131/92  Pulse: 86  Resp: 16  Temp: 36.8 C  SpO2: 98%    Last Pain:  Vitals:   01/22/17 0610  TempSrc: Oral      Patients Stated Pain Goal: 3 (48/01/[birth year] 5374)  Complications: No apparent anesthesia complications

## 2017-01-22 NOTE — Progress Notes (Signed)
There has been no change in the patients history, status or exam since the history and physical.  Vitals:   01/22/17 0610  BP: (!) 131/92  Pulse: 86  Resp: 16  Temp: 98.2 F (36.8 C)  TempSrc: Oral  SpO2: 98%    Lab Results  Component Value Date   WBC 7.1 01/13/2017   HGB 13.1 01/13/2017   HCT 40.1 01/13/2017   MCV 88.7 01/13/2017   PLT 209 01/13/2017   Results for orders placed or performed during the hospital encounter of 01/22/17 (from the past 24 hour(s))  Pregnancy, urine     Status: None   Collection Time: 01/22/17  5:55 AM  Result Value Ref Range   Preg Test, Ur NEGATIVE NEGATIVE   Zakiah Beckerman A

## 2017-01-22 NOTE — Op Note (Signed)
01/22/2017  9:51 AM  PATIENT:  Brooke Wood  51 y.o. female  PRE-OPERATIVE DIAGNOSIS:  OVARIAN CYST  POST-OPERATIVE DIAGNOSIS:  ovarian cyst  PROCEDURE:  Procedure(s): ROBOTIC ASSISTED TOTAL HYSTERECTOMY WITH BILATERAL SALPINGECTOMY AND LEFT OOPHORECTOMY (Bilateral) CYSTOSCOPY (N/A)  SURGEON:  Surgeon(s) and Role:    Bobbye Charleston, MD - Primary  ASSISTANTS: Dr. Jerelyn Charles   ANESTHESIA:   general  EBL:  50 mL   Urine sufficient  IVF per anesthesia record  LOCAL MEDICATIONS USED:  OTHER Ropivicaine, Arista  SPECIMEN:  Source of Specimen:  Uterus, tubes, L ovary  DISPOSITION OF SPECIMEN:  PATHOLOGY  COUNTS:  YES  TOURNIQUET:  * No tourniquets in log *  DICTATION: .Note written in EPIC  PLAN OF CARE: Admit for overnight observation  PATIENT DISPOSITION:  PACU - hemodynamically stable.   Delay start of Pharmacological VTE agent (>24hrs) due to surgical blood loss or risk of bleeding: not applicable  Complications:  None.  Findings:  9 weeks size uterus.  R ovary was normal.  L ovary had the 5 cm cyst.  The sigmoid was adhesed to the uterus and round ligament but the ovary was free. The ureters were identified during multiple points of the case and were always out of the field of dissection.  On cystoscopy, the bladder was intact and bilateral spill was seen from each ureteral orriface.    Medications:  Ancef.  Ropivicaine.  Arista  Technique:  After adequate anesthesia was achieved the patient was positioned, prepped and draped in usual sterile fashion.  A speculum was placed in the vagina and the cervix dilated with pratt dilators.  The Advincula with 3 cm Koh ring was placed in proper fashion.  The  Speculum was removed and the bladder catheterized with a foley.    Attention was turned to the abdomen where a 1 cm incision was made 1 cm above the umbilicus.  The veress needle was introduced without aspiration of bowel contents or blood and the abdomen  insufflated. The long 12 mm trocar was placed and the other three trocar sites were marked out, all approximately 10 cm from each other and the camera.  Two 8.5 mm trocars were placed on either side and just below the plane of the camera port and a 5 mm assistant port was placed 3 cm above the the plane of the camera.  All trocars were inserted under direct visualization of the camera.  The patient was placed in trendelenburg and then the Robot docked.  The PK forceps were placed on arm 2 and the Hot shears on arm 1 and introduced under direct visualization of the camera.  I then broke scrub and sat down at the console.  The above findings were noted and the ureters identified well out of the field of dissection.  The addhesions of the sigmoid to the uterus and anterior abdominal wall were taken down with the hot shears. The right fallopian tube was isolated and cauterized with the PK.  The Utero-ovarian ligament was then divided with the PK cautery and shears.  The posterior broad ligament was then divided with the hot shears until the uterosacral ligament.  The Broad and cardinal ligaments were then cauterized against the cervix to the level of the Koh ring, securing the uterine artery.  Each pedicle was then incised with the shears.  The anterior leaf was then incised at the reflection of the vessico-uterine junction and the lateral bladder retracted inferiorly after the round ligament  had been divided with the PK forceps.  The left IP ligament was cauterized with the PK and divided with the shears;  then the left broad ligament divided with the PK forceps and the scissors.  The round ligament was divided as well and the posterior leaf of the broad ligament then divided with the hot shears. The broad and cardinal ligaments were then cauterized on the left in the same way.   At the level of the internal os, the uterine arteries were bilaterally cauterized with the PK.  The ureters were identified well out of the  field of dissection.  .   The bladder was then able to be retracted inferiorly and the vesico-uterine fascia was incised in the midline until the bladder was removed one cm below the Koh ring.  The hot shears then circumferentially incised the vagina at the level of the reflection on the [facility] ring.  Once the uterus and cervix were amputated, cautery was used to insure hemostasis of the cuff.  Once hemostasis was achieved, the instruments were changed to the mega needle driver and mega suture cut needle driver and the cuff was closed with a running stitches of 0-vicryl V loc.   Cautery was used to ensure hemostasis of the left pedicles very superficially.  The ureters were peristalsing bilaterally well and very lateral to the areas of operation.    The Robot was then undocked and I scrubbed back in.  The needle was removed and the Arista and Ropivicaine were introduced into the pelvis.  There was a small amount of oozing still of the L cuff and this was cauterized with the monopolar scissors. The fascia of the 12 mm trocar was closed with a figure of 8 stitch of 0 vicryl.  The skin incisions were closed with subcuticular stitches of 3-0 vicryl Rapide and Dermabond.  All instruments were removed from the vagina and cystoscopy performed, revealing an intact bladder and vigourous spill of indigo carmine from each ureteral orifice.  The cystoscope was removed and the patient taken to the recovery room in stable condition.  Wayburn Shaler A

## 2017-01-22 NOTE — Progress Notes (Signed)
Patient unable to do incentive spirometer with instructions and demonstration.  Will continue with instructions and deep breathing and coughing.

## 2017-01-22 NOTE — Anesthesia Procedure Notes (Addendum)
Procedure Name: Intubation Date/Time: 01/22/2017 7:48 AM Performed by: Casimer Lanius A Pre-anesthesia Checklist: Patient identified, Emergency Drugs available, Suction available and Patient being monitored Patient Re-evaluated:Patient Re-evaluated prior to induction Oxygen Delivery Method: Circle system utilized and Simple face mask Preoxygenation: Pre-oxygenation with 100% oxygen Induction Type: IV induction, Combination inhalational/ intravenous induction and Cricoid Pressure applied Ventilation: Mask ventilation without difficulty Laryngoscope Size: Mac and 3 (Mac 3 X2 LB,FH Lopro 3 X1 FH) Grade View: Grade II Tube type: Oral Tube size: 7.0 mm Number of attempts: 3 (Anterior due to bun and trendguard Grade 4 view with DL) Airway Equipment and Method: Stylet and Video-laryngoscopy Placement Confirmation: ETT inserted through vocal cords under direct vision,  positive ETCO2 and breath sounds checked- equal and bilateral Secured at: 20 (right lip) cm Tube secured with: Tape Dental Injury: Teeth and Oropharynx as per pre-operative assessment  Difficulty Due To: Difficulty was unanticipated and Difficult Airway- due to anterior larynx Future Recommendations: Recommend- induction with short-acting agent, and alternative techniques readily available

## 2017-01-22 NOTE — Brief Op Note (Signed)
01/22/2017  9:51 AM  PATIENT:  Brooke Wood  51 y.o. female  PRE-OPERATIVE DIAGNOSIS:  OVARIAN CYST  POST-OPERATIVE DIAGNOSIS:  ovarian cyst  PROCEDURE:  Procedure(s): ROBOTIC ASSISTED TOTAL HYSTERECTOMY WITH BILATERAL SALPINGECTOMY AND LEFT OOPHORECTOMY (Bilateral) CYSTOSCOPY (N/A)  SURGEON:  Surgeon(s) and Role:    Bobbye Charleston, MD - Primary  ASSISTANTS: Dr. Jerelyn Charles   ANESTHESIA:   general  EBL:  50 mL    LOCAL MEDICATIONS USED:  OTHER Ropivicaine, Arista  SPECIMEN:  Source of Specimen:  Uterus, tubes, L ovary  DISPOSITION OF SPECIMEN:  PATHOLOGY  COUNTS:  YES  TOURNIQUET:  * No tourniquets in log *  DICTATION: .Note written in EPIC  PLAN OF CARE: Admit for overnight observation  PATIENT DISPOSITION:  PACU - hemodynamically stable.   Delay start of Pharmacological VTE agent (>24hrs) due to surgical blood loss or risk of bleeding: not applicable  Complications:  None.  Findings:  9 weeks size uterus.  R ovary was normal.  L ovary had the 5 cm cyst.  The sigmoid was adhesed to the uterus and round ligament but the ovary was free. The ureters were identified during multiple points of the case and were always out of the field of dissection.  On cystoscopy, the bladder was intact and bilateral spill was seen from each ureteral orriface.    Medications:  Ancef.  Ropivicaine.  Arista  Technique:  After adequate anesthesia was achieved the patient was positioned, prepped and draped in usual sterile fashion.  A speculum was placed in the vagina and the cervix dilated with pratt dilators.  The Advincula with 3 cm Koh ring was placed in proper fashion.  The  Speculum was removed and the bladder catheterized with a foley.    Attention was turned to the abdomen where a 1 cm incision was made 1 cm above the umbilicus.  The veress needle was introduced without aspiration of bowel contents or blood and the abdomen insufflated. The long 12 mm trocar was placed and  the other three trocar sites were marked out, all approximately 10 cm from each other and the camera.  Two 8.5 mm trocars were placed on either side and just below the plane of the camera port and a 5 mm assistant port was placed 3 cm above the the plane of the camera.  All trocars were inserted under direct visualization of the camera.  The patient was placed in trendelenburg and then the Robot docked.  The PK forceps were placed on arm 2 and the Hot shears on arm 1 and introduced under direct visualization of the camera.  I then broke scrub and sat down at the console.  The above findings were noted and the ureters identified well out of the field of dissection.  The addhesions of the sigmoid to the uterus and anterior abdominal wall were taken down with the hot shears. The right fallopian tube was isolated and cauterized with the PK.  The Utero-ovarian ligament was then divided with the PK cautery and shears.  The posterior broad ligament was then divided with the hot shears until the uterosacral ligament.  The Broad and cardinal ligaments were then cauterized against the cervix to the level of the Koh ring, securing the uterine artery.  Each pedicle was then incised with the shears.  The anterior leaf was then incised at the reflection of the vessico-uterine junction and the lateral bladder retracted inferiorly after the round ligament had been divided with the PK forceps.  The left IP ligament was cauterized with the PK and divided with the shears;  then the left broad ligament divided with the PK forceps and the scissors.  The round ligament was divided as well and the posterior leaf of the broad ligament then divided with the hot shears. The broad and cardinal ligaments were then cauterized on the left in the same way.   At the level of the internal os, the uterine arteries were bilaterally cauterized with the PK.  The ureters were identified well out of the field of dissection.  .   The bladder was then  able to be retracted inferiorly and the vesico-uterine fascia was incised in the midline until the bladder was removed one cm below the Koh ring.  The hot shears then circumferentially incised the vagina at the level of the reflection on the [facility] ring.  Once the uterus and cervix were amputated, cautery was used to insure hemostasis of the cuff.  Once hemostasis was achieved, the instruments were changed to the mega needle driver and mega suture cut needle driver and the cuff was closed with a running stitches of 0-vicryl V loc.   Cautery was used to ensure hemostasis of the left pedicles very superficially.  The ureters were peristalsing bilaterally well and very lateral to the areas of operation.    The Robot was then undocked and I scrubbed back in.  The needle was removed and the Arista and Ropivicaine were introduced into the pelvis.  There was a small amount of oozing still of the L cuff and this was cauterized with the monopolar scissors. The fascia of the 12 mm trocar was closed with a figure of 8 stitch of 0 vicryl.  The skin incisions were closed with subcuticular stitches of 3-0 vicryl Rapide and Dermabond.  All instruments were removed from the vagina and cystoscopy performed, revealing an intact bladder and vigourous spill of indigo carmine from each ureteral orifice.  The cystoscope was removed and the patient taken to the recovery room in stable condition.  Estephanie Hubbs A

## 2017-01-23 ENCOUNTER — Encounter (HOSPITAL_COMMUNITY): Payer: Self-pay | Admitting: Obstetrics and Gynecology

## 2017-01-23 ENCOUNTER — Other Ambulatory Visit: Payer: Self-pay | Admitting: Internal Medicine

## 2017-01-23 DIAGNOSIS — Z886 Allergy status to analgesic agent status: Secondary | ICD-10-CM | POA: Diagnosis not present

## 2017-01-23 DIAGNOSIS — I10 Essential (primary) hypertension: Secondary | ICD-10-CM | POA: Diagnosis not present

## 2017-01-23 DIAGNOSIS — D259 Leiomyoma of uterus, unspecified: Secondary | ICD-10-CM | POA: Diagnosis not present

## 2017-01-23 DIAGNOSIS — N72 Inflammatory disease of cervix uteri: Secondary | ICD-10-CM | POA: Diagnosis not present

## 2017-01-23 DIAGNOSIS — N83202 Unspecified ovarian cyst, left side: Secondary | ICD-10-CM | POA: Diagnosis not present

## 2017-01-23 DIAGNOSIS — Z853 Personal history of malignant neoplasm of breast: Secondary | ICD-10-CM | POA: Diagnosis not present

## 2017-01-23 DIAGNOSIS — N800 Endometriosis of uterus: Secondary | ICD-10-CM | POA: Diagnosis not present

## 2017-01-23 DIAGNOSIS — Z23 Encounter for immunization: Secondary | ICD-10-CM | POA: Diagnosis not present

## 2017-01-23 DIAGNOSIS — Z79899 Other long term (current) drug therapy: Secondary | ICD-10-CM | POA: Diagnosis not present

## 2017-01-23 DIAGNOSIS — N801 Endometriosis of ovary: Secondary | ICD-10-CM | POA: Diagnosis not present

## 2017-01-23 MED ORDER — OXYCODONE-ACETAMINOPHEN 5-325 MG PO TABS: 1.0000 | ORAL_TABLET | ORAL | 0 refills | Status: DC | PRN

## 2017-01-23 MED FILL — OXYCOD/ACETAMINOPHEN 5-325M: 5-325 | 3 days supply | Qty: 30 | Fill #0

## 2017-01-23 MED FILL — VALSARTAN-HCTZ 160-25 MG TA: 160-25 | 30 days supply | Qty: 30 | Fill #0

## 2017-01-23 NOTE — Progress Notes (Signed)
Patient is eating, ambulating, and voiding.  Pain control is good.  BP (!) 121/58 (BP Location: Left Arm)   Pulse 73   Temp 98.8 F (37.1 C) (Oral)   Resp 18   Ht 5\' 5"  (1.651 m)   Wt 160 lb (72.6 kg)   SpO2 99%   BMI 26.63 kg/m   lungs:  clear to auscultation cor:    RRR Abdomen:  soft, appropriate tenderness, incisions intact and without erythema or exudate. ex:    no cords   Lab Results  Component Value Date   WBC 7.1 01/13/2017   HGB 13.1 01/13/2017   HCT 40.1 01/13/2017   MCV 88.7 01/13/2017   PLT 209 01/13/2017    A/P  Routine care.  Expect d/c per plan.

## 2017-01-23 NOTE — Anesthesia Postprocedure Evaluation (Signed)
Anesthesia Post Note  Patient: EMARIE PAUL  Procedure(s) Performed: ROBOTIC ASSISTED TOTAL HYSTERECTOMY WITH BILATERAL SALPINGECTOMY AND LEFT OOPHORECTOMY (Bilateral Abdomen) CYSTOSCOPY (N/A Bladder)     Patient location during evaluation: [facility] Anesthesia Type: General Level of consciousness: awake and alert Pain management: pain level controlled Vital Signs Assessment: post-procedure vital signs reviewed and stable Respiratory status: spontaneous breathing, nonlabored ventilation and respiratory function stable Cardiovascular status: blood pressure returned to baseline and stable Postop Assessment: no apparent nausea or vomiting Anesthetic complications: no    Last Vitals:  Vitals:   01/23/17 0337 01/23/17 0759  BP: (!) 121/58 121/77  Pulse: 73 80  Resp: 18 18  Temp: 37.1 C 36.5 C  SpO2: 99% 99%    Last Pain:  Vitals:   01/23/17 0830  TempSrc:   PainSc: 2    Pain Goal: Patients Stated Pain Goal: 3 (01/23/17 0830)               Riki Sheer

## 2017-01-23 NOTE — Addendum Note (Signed)
Addendum  created 01/23/17 0855 by Riki Sheer, CRNA   Sign clinical note

## 2017-01-23 NOTE — Anesthesia Postprocedure Evaluation (Signed)
Anesthesia Post Note  Patient: Brooke Wood  Procedure(s) Performed: ROBOTIC ASSISTED TOTAL HYSTERECTOMY WITH BILATERAL SALPINGECTOMY AND LEFT OOPHORECTOMY (Bilateral Abdomen) CYSTOSCOPY (N/A Bladder)     Patient location during evaluation: PACU Anesthesia Type: General Level of consciousness: awake and sedated Pain management: pain level controlled Vital Signs Assessment: post-procedure vital signs reviewed and stable Respiratory status: spontaneous breathing Cardiovascular status: stable Postop Assessment: no apparent nausea or vomiting Anesthetic complications: no    Last Vitals:  Vitals:   01/23/17 0000 01/23/17 0337  BP: 116/68 (!) 121/58  Pulse: 88 73  Resp: 18 18  Temp: 36.9 C 37.1 C  SpO2: 97% 99%    Last Pain:  Vitals:   01/23/17 0700  TempSrc:   PainSc: 0-No pain   Pain Goal: Patients Stated Pain Goal: 3 (01/23/17 0415)               Kyleeann Cremeans JR,JOHN Mateo Flow

## 2017-01-23 NOTE — Discharge Summary (Signed)
Physician Discharge Summary  Patient ID: Brooke Wood MRN: [id number] DOB/AGE: [birth year] 51 y.o.  Admit date: 01/22/2017 Discharge date: 01/23/2017  Admission Diagnoses:L Ovarian cyst, hx breast cancer  Discharge Diagnoses: same Active Problems:   Postoperative state   Discharged Condition: good  Hospital Course: Uncomplicated TLH, LSO, R salpingectomy, cystoscopy  Consults: None  Significant Diagnostic Studies: none  Treatments: surgery: Uncomplicated TLH, LSO, R salpingectomy, cystoscopy   Discharge Exam: Blood pressure (!) 121/58, pulse 73, temperature 98.8 F (37.1 C), temperature source Oral, resp. rate 18, height 5\' 5"  (1.651 m), weight 160 lb (72.6 kg), SpO2 99 %.   Disposition: 01-Home or Self Care  Discharge Instructions    Call MD for:  temperature >100.4    Complete by:  As directed    Diet - low sodium heart healthy    Complete by:  As directed    Discharge instructions    Complete by:  As directed    No driving on narcotics, no sexual activity for 2 weeks.   Increase activity slowly    Complete by:  As directed    May shower / Bathe    Complete by:  As directed    Shower, no bath for 2 weeks.   Remove dressing in 24 hours    Complete by:  As directed    Sexual Activity Restrictions    Complete by:  As directed    No sexual activity for 2 weeks.     Allergies as of 01/23/2017      Reactions   Aspirin Hives   Pt is able to take ibuprofen without problems   Codeine Other (See Comments)   "confusion"      Medication List    TAKE these medications   acetaminophen 325 MG tablet Commonly known as:  TYLENOL Take 650 mg by mouth every 6 (six) hours as needed for mild pain or headache.   cetirizine 10 MG tablet Commonly known as:  ZYRTEC Take 1 tablet (10 mg total) by mouth daily. What changed:  when to take this  reasons to take this   hydrOXYzine 10 MG tablet Commonly known as:  ATARAX/VISTARIL Take 1 tablet (10 mg total) by mouth 3  (three) times daily as needed.   KLOR-CON M20 20 MEQ tablet Generic drug:  potassium chloride SA TAKE 1 TABLET BY MOUTH 3 TIMES DAILY What changed:  See the new instructions.   oxyCODONE-acetaminophen 5-325 MG tablet Commonly known as:  PERCOCET/ROXICET Take 1-2 tablets by mouth every 4 (four) hours as needed for severe pain (moderate to severe pain (when tolerating fluids)).   tamoxifen 20 MG tablet Commonly known as:  NOLVADEX TAKE 1 TABLET BY MOUTH ONCE DAILY   valsartan-hydrochlorothiazide 160-25 MG tablet Commonly known as:  DIOVAN-HCT TAKE 1 TABLET BY MOUTH ONCE DAILY What changed:  See the new instructions.      Follow-up Information    Bobbye Charleston, MD Follow up in 2 week(s).   Specialty:  Obstetrics and Gynecology Contact information: [address] [place] [address] [phone]           Signed: Meleny Tregoning A 01/23/2017, 6:25 AM

## 2017-01-30 ENCOUNTER — Other Ambulatory Visit: Payer: Self-pay | Admitting: Internal Medicine

## 2017-01-30 MED FILL — POTASSIUM CL ER 20 MEQ TAB: 20 | 30 days supply | Qty: 90 | Fill #0

## 2017-02-13 ENCOUNTER — Ambulatory Visit
Admission: RE | Admit: 2017-02-13 | Discharge: 2017-02-13 | Disposition: A | Discharge status: Home / Self Care | Payer: BLUE CROSS/BLUE SHIELD | Source: Ambulatory Visit | Attending: Radiation Oncology | Admitting: Radiation Oncology

## 2017-02-13 ENCOUNTER — Encounter: Payer: Self-pay | Admitting: Radiation Oncology

## 2017-02-13 ENCOUNTER — Other Ambulatory Visit: Payer: Self-pay

## 2017-02-13 DIAGNOSIS — N644 Mastodynia: Secondary | ICD-10-CM | POA: Insufficient documentation

## 2017-02-13 DIAGNOSIS — Z885 Allergy status to narcotic agent status: Secondary | ICD-10-CM | POA: Diagnosis not present

## 2017-02-13 DIAGNOSIS — C50411 Malignant neoplasm of upper-outer quadrant of right female breast: Secondary | ICD-10-CM | POA: Insufficient documentation

## 2017-02-13 DIAGNOSIS — M7989 Other specified soft tissue disorders: Secondary | ICD-10-CM | POA: Diagnosis not present

## 2017-02-13 DIAGNOSIS — Z923 Personal history of irradiation: Secondary | ICD-10-CM | POA: Diagnosis not present

## 2017-02-13 DIAGNOSIS — I890 Lymphedema, not elsewhere classified: Secondary | ICD-10-CM | POA: Insufficient documentation

## 2017-02-13 DIAGNOSIS — Z886 Allergy status to analgesic agent status: Secondary | ICD-10-CM | POA: Diagnosis not present

## 2017-02-13 DIAGNOSIS — Z79899 Other long term (current) drug therapy: Secondary | ICD-10-CM | POA: Insufficient documentation

## 2017-02-13 DIAGNOSIS — Z90710 Acquired absence of both cervix and uterus: Secondary | ICD-10-CM | POA: Insufficient documentation

## 2017-02-13 DIAGNOSIS — Z08 Encounter for follow-up examination after completed treatment for malignant neoplasm: Secondary | ICD-10-CM | POA: Diagnosis not present

## 2017-02-13 DIAGNOSIS — Z79891 Long term (current) use of opiate analgesic: Secondary | ICD-10-CM | POA: Insufficient documentation

## 2017-02-13 DIAGNOSIS — Z170 Estrogen receptor positive status [ER+]: Secondary | ICD-10-CM

## 2017-02-13 NOTE — Progress Notes (Signed)
Radiation Oncology         [phone] ________________________________  Name: Brooke Wood MRN: [id number]  Date: 02/13/2017  DOB: [birth year]  Follow-Up Visit Note  CC: Brooke Lima, MD  Brooke Panning, MD    ICD-10-CM   1. 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 The site of presentation (lumpectomy cavity) in the upper outer quadrant of the right breast was boosted to a cumulative dose of 60.4 gray   Narrative: Brooke Wood presents for a routine follow-up. She is doing well overall. Pt reports  she had a hysterectomy with bilateral salpingectomy and left oophorectomy (bilateral) cystoscopy completed on 01/22/2017. She spent one night in the hospital following her surgery. Pt states that she is scheduled to have a mammogram in January, 2019. Pt continues to take tamoxifen and notes that she has hot flashes while on this medication. Pt reports mild soreness to her breast, mild swelling to her right arm, wrist, and hand. Pt is scheduled to be fitted for a lymphedema glove and compression sleeve for her right arm and hand. Pt denies nipple discharge, numbness, or tingling. Pt works part-time at [employer] and she returns on 03/06/2017.   ALLERGIES:  is allergic to aspirin and codeine.  Meds: Current Outpatient Medications  Medication Sig Dispense Refill  . acetaminophen (TYLENOL) 325 MG tablet Take 650 mg by mouth every 6 (six) hours as needed for mild pain or headache.    . potassium chloride SA (K-DUR,KLOR-CON) 20 MEQ tablet TAKE 1 TABLET BY MOUTH 3 TIMES DAILY 90 tablet 3  . tamoxifen (NOLVADEX) 20 MG tablet TAKE 1 TABLET BY MOUTH ONCE DAILY 90 tablet 3  . valsartan-hydrochlorothiazide (DIOVAN-HCT) 160-25 MG  tablet TAKE 1 TABLET BY MOUTH ONCE DAILY 90 tablet 0  . cetirizine (ZYRTEC) 10 MG tablet Take 1 tablet (10 mg total) by mouth daily. (Patient not taking: Reported on 02/13/2017) 14 tablet 0  . hydrOXYzine (ATARAX/VISTARIL) 10 MG tablet Take 1 tablet (10 mg total) by mouth 3 (three) times daily as needed. (Patient not taking: Reported on 01/03/2017) 30 tablet 0  . oxyCODONE-acetaminophen (PERCOCET/ROXICET) 5-325 MG tablet Take 1-2 tablets by mouth every 4 (four) hours as needed for severe pain (moderate to severe pain (when tolerating fluids)). (Patient not taking: Reported on 02/13/2017) 30 tablet 0   No current facility-administered medications for this encounter.     Physical Findings: The patient is in no acute distress. Patient is alert and oriented.  height is 5\' 5"  (1.651 m) and weight is 158 lb 3.2 oz (71.8 kg). Her oral temperature is 98.4 F (36.9 C). Her blood pressure is 118/91 (abnormal) and her pulse is 91. Her oxygen saturation is 97%.   Lungs are clear to auscultation bilaterally. Heart has regular rate and rhythm. No palpable cervical, supraclavicular, or axillary adenopathy. Abdomen soft, non-tender, normal bowel sounds. Mild hyperpigmentation changes in the right breast. No palpable mass, nipple discharge, or bleeding in bilateral breasts. Mild swelling in her right wrist.    Lab Findings: Lab Results  Component Value Date   WBC 7.1 01/13/2017   HGB 13.1 01/13/2017   HCT 40.1 01/13/2017   MCV 88.7 01/13/2017   PLT 209 01/13/2017    Radiographic Findings:  No results found.  Impression:  No evidence of recurrence on clinical exam today.   Plan: Patient will follow-up in radiation oncology in 6 months. She will continue on Tamoxifen. Pt is scheduled for a mammogram in January 2019 and will FU with Dr. Jana Wood in March of 2019. Pt is scheduled to be fitted for a lymphedema sleeve for the right arm and hand.  -----------------------------------  Brooke Promise, PhD,  MD  This document serves as a record of services personally performed by Brooke Pray, MD. It was created on his behalf by Brooke Wood, a trained medical scribe. The creation of this record is based on the scribe's personal observations and the provider's statements to them. This document has been checked and approved by the attending provider.

## 2017-02-13 NOTE — Progress Notes (Signed)
Brooke Wood is here for follow up after treatment to her right breast.  She denies having any pain.  She had a partial hysterectomy 01/22/17 due to a cyst on her left ovary.  She continues to take Tamoxifen.  She is having some swelling in her right arm and hand.  She will be measured for a sleeve and glove on Tuesday.  The skin on her right breast has hyperpigmentation.  BP (!) 118/91 (BP Location: Left Arm, Patient Position: Sitting)   Pulse 91   Temp 98.4 F (36.9 C) (Oral)   Ht 5\' 5"  (1.651 m)   Wt 158 lb 3.2 oz (71.8 kg)   LMP 01/07/2016   SpO2 97%   BMI 26.33 kg/m    Wt Readings from Last 3 Encounters:  02/13/17 158 lb 3.2 oz (71.8 kg)  01/22/17 160 lb (72.6 kg)  01/13/17 162 lb 2 oz (73.5 kg)

## 2017-02-18 DIAGNOSIS — C50919 Malignant neoplasm of unspecified site of unspecified female breast: Secondary | ICD-10-CM | POA: Diagnosis not present

## 2017-03-05 ENCOUNTER — Other Ambulatory Visit: Payer: Self-pay | Admitting: Oncology

## 2017-03-05 ENCOUNTER — Other Ambulatory Visit: Payer: Self-pay | Admitting: Internal Medicine

## 2017-03-05 DIAGNOSIS — C50911 Malignant neoplasm of unspecified site of right female breast: Secondary | ICD-10-CM

## 2017-03-05 MED FILL — VALSARTAN-HCTZ 160-25 MG TA: 160-25 | 60 days supply | Qty: 60 | Fill #1

## 2017-03-05 MED FILL — TAMOXIFEN CITRATE 20 MG TAB: 20 | 30 days supply | Qty: 30 | Fill #0

## 2017-03-05 NOTE — Telephone Encounter (Signed)
Through April 2019

## 2017-03-12 ENCOUNTER — Encounter (HOSPITAL_COMMUNITY): Payer: Self-pay

## 2017-04-03 ENCOUNTER — Other Ambulatory Visit: Payer: Self-pay | Admitting: Oncology

## 2017-04-03 DIAGNOSIS — Z853 Personal history of malignant neoplasm of breast: Secondary | ICD-10-CM

## 2017-04-09 MED FILL — TAMOXIFEN CITRATE 20 MG TAB: 20 | 30 days supply | Qty: 30 | Fill #1

## 2017-05-02 ENCOUNTER — Ambulatory Visit
Admission: RE | Admit: 2017-05-02 | Discharge: 2017-05-02 | Disposition: A | Discharge status: Home / Self Care | Payer: BLUE CROSS/BLUE SHIELD | Source: Ambulatory Visit | Attending: Oncology | Admitting: Oncology

## 2017-05-02 ENCOUNTER — Other Ambulatory Visit: Payer: Self-pay | Admitting: Oncology

## 2017-05-02 DIAGNOSIS — N6489 Other specified disorders of breast: Secondary | ICD-10-CM

## 2017-05-02 DIAGNOSIS — Z853 Personal history of malignant neoplasm of breast: Secondary | ICD-10-CM

## 2017-05-02 DIAGNOSIS — R922 Inconclusive mammogram: Secondary | ICD-10-CM | POA: Diagnosis not present

## 2017-05-02 HISTORY — DX: Personal history of irradiation: Z92.3

## 2017-05-02 LAB — HM MAMMOGRAPHY

## 2017-05-05 ENCOUNTER — Other Ambulatory Visit: Payer: Self-pay | Admitting: Internal Medicine

## 2017-05-05 MED FILL — POTASSIUM CL ER 20 MEQ TABL: 20 | 30 days supply | Qty: 90 | Fill #1

## 2017-05-05 MED FILL — TAMOXIFEN CITRATE 20 MG TAB: 20 | 30 days supply | Qty: 30 | Fill #2

## 2017-05-05 MED FILL — VALSARTAN-HCTZ 160-25 MG TA: 160-25 | 90 days supply | Qty: 90 | Fill #0

## 2017-05-14 ENCOUNTER — Telehealth: Payer: Self-pay | Admitting: Oncology

## 2017-05-14 NOTE — Telephone Encounter (Signed)
Patient called and said she had a mammogram on 05/02/17 and wants to make sure Dr. Sondra Come has seen it.  She is concerned because the report shows that she has dense breasts and that she will need to have another mammogram in 6 months.  Advised her that we do have the report and that Dr. Sondra Come will be notified.  She also asked when her next appointment is with Dr. Jana Hakim.  Reviewed time on 06/12/17 at 10:30.  She verbalized understanding.

## 2017-05-21 NOTE — Telephone Encounter (Signed)
Left a message for patient advising her that Dr. Sondra Come has reviewed her mammogram and ultrasound.

## 2017-05-26 ENCOUNTER — Ambulatory Visit: Payer: BLUE CROSS/BLUE SHIELD | Admitting: Internal Medicine

## 2017-05-26 ENCOUNTER — Encounter: Payer: Self-pay | Admitting: Internal Medicine

## 2017-05-26 VITALS — BP 162/82 | HR 96 | Temp 98.0°F | Resp 16 | Wt 164.0 lb

## 2017-05-26 DIAGNOSIS — J069 Acute upper respiratory infection, unspecified: Secondary | ICD-10-CM | POA: Diagnosis not present

## 2017-05-26 DIAGNOSIS — Z0000 Encounter for general adult medical examination without abnormal findings: Secondary | ICD-10-CM | POA: Diagnosis not present

## 2017-05-26 DIAGNOSIS — E049 Nontoxic goiter, unspecified: Secondary | ICD-10-CM

## 2017-05-26 DIAGNOSIS — E785 Hyperlipidemia, unspecified: Secondary | ICD-10-CM

## 2017-05-26 DIAGNOSIS — B9789 Other viral agents as the cause of diseases classified elsewhere: Secondary | ICD-10-CM | POA: Diagnosis not present

## 2017-05-26 DIAGNOSIS — I10 Essential (primary) hypertension: Secondary | ICD-10-CM

## 2017-05-26 DIAGNOSIS — Z1159 Encounter for screening for other viral diseases: Secondary | ICD-10-CM

## 2017-05-26 MED ORDER — PROMETHAZINE-DM 6.25-15 MG/5ML PO SYRP: 5.0000 mL | ORAL_SOLUTION | Freq: Four times a day (QID) | ORAL | 0 refills | Status: DC | PRN

## 2017-05-26 MED FILL — PROMETHAZINE-DM SYRUP: 6.25-15 | 6 days supply | Qty: 118 | Fill #0

## 2017-05-26 NOTE — Patient Instructions (Signed)

## 2017-05-26 NOTE — Progress Notes (Signed)
Subjective:  Patient ID: Brooke Wood, female    DOB: 05-10-1965  Age: 52 y.o. MRN: [id number]  CC: Hypertension; Annual Exam; and URI   HPI Brooke Wood presents for a CPX.  She has not taken her antihypertensives for 3 days and therefore has not achieved blood pressure control.  She denies headache, blurred vision, chest pain, or shortness of breath.  She complains of a 3-day history of nasal congestion, runny nose, itchy watery eyes, nonproductive cough, and chills.  She denies sore throat, fever, night sweats, or lymphadenopathy.  Outpatient Medications Prior to Visit  Medication Sig Dispense Refill  . acetaminophen (TYLENOL) 325 MG tablet Take 650 mg by mouth every 6 (six) hours as needed for mild pain or headache.    . potassium chloride SA (K-DUR,KLOR-CON) 20 MEQ tablet TAKE 1 TABLET BY MOUTH 3 TIMES DAILY 90 tablet 3  . tamoxifen (NOLVADEX) 20 MG tablet TAKE 1 TABLET BY MOUTH ONCE DAILY 30 tablet 4  . valsartan-hydrochlorothiazide (DIOVAN-HCT) 160-25 MG tablet TAKE 1 TABLET BY MOUTH ONCE DAILY 90 tablet 0  . cetirizine (ZYRTEC) 10 MG tablet Take 1 tablet (10 mg total) by mouth daily. (Patient not taking: Reported on 02/13/2017) 14 tablet 0  . hydrOXYzine (ATARAX/VISTARIL) 10 MG tablet Take 1 tablet (10 mg total) by mouth 3 (three) times daily as needed. (Patient not taking: Reported on 01/03/2017) 30 tablet 0  . oxyCODONE-acetaminophen (PERCOCET/ROXICET) 5-325 MG tablet Take 1-2 tablets by mouth every 4 (four) hours as needed for severe pain (moderate to severe pain (when tolerating fluids)). (Patient not taking: Reported on 02/13/2017) 30 tablet 0   No facility-administered medications prior to visit.     ROS Review of Systems  Constitutional: Positive for chills. Negative for appetite change, diaphoresis, fatigue and fever.  HENT: Positive for congestion, postnasal drip and rhinorrhea. Negative for facial swelling, nosebleeds, sinus pressure, sinus pain, sore throat, trouble  swallowing and voice change.   Eyes: Negative.   Respiratory: Positive for cough. Negative for chest tightness, shortness of breath and wheezing.   Cardiovascular: Negative.  Negative for chest pain, palpitations and leg swelling.  Gastrointestinal: Negative.  Negative for abdominal pain, constipation, diarrhea and vomiting.  Endocrine: Negative.   Genitourinary: Negative.  Negative for decreased urine volume, difficulty urinating, dysuria, frequency and urgency.  Musculoskeletal: Negative.  Negative for arthralgias and myalgias.  Skin: Negative for color change and pallor.  Allergic/Immunologic: Negative.   Neurological: Negative.  Negative for dizziness, weakness, light-headedness, numbness and headaches.  Hematological: Negative for adenopathy. Does not bruise/bleed easily.  Psychiatric/Behavioral: Negative.     Objective:  BP (!) 162/82   Pulse 96   Temp 98 F (36.7 C)   Resp 16   Wt 164 lb (74.4 kg)   LMP 01/07/2016   SpO2 97%   BMI 27.29 kg/m   BP Readings from Last 3 Encounters:  05/26/17 (!) 162/82  02/13/17 (!) 118/91  01/23/17 121/77    Wt Readings from Last 3 Encounters:  05/26/17 164 lb (74.4 kg)  02/13/17 158 lb 3.2 oz (71.8 kg)  01/22/17 160 lb (72.6 kg)    Physical Exam  Constitutional: She is oriented to person, place, and time.  Non-toxic appearance. She does not have a sickly appearance. She does not appear ill. No distress.  HENT:  Nose: No mucosal edema or rhinorrhea. Right sinus exhibits no maxillary sinus tenderness and no frontal sinus tenderness. Left sinus exhibits no maxillary sinus tenderness and no frontal sinus tenderness.  Mouth/Throat:  Oropharynx is clear and moist and mucous membranes are normal. Mucous membranes are not pale, not dry and not cyanotic. No oral lesions. No trismus in the jaw. No uvula swelling. No oropharyngeal exudate, posterior oropharyngeal edema, posterior oropharyngeal erythema or tonsillar abscesses.  Eyes: Conjunctivae  are normal. Left eye exhibits no discharge. No scleral icterus.  Neck: Normal range of motion. Neck supple. No JVD present. No thyroid mass and no thyromegaly present.  Cardiovascular: Normal rate, regular rhythm and normal heart sounds. Exam reveals no gallop and no friction rub.  No murmur heard. Pulmonary/Chest: Effort normal and breath sounds normal. No respiratory distress. She has no wheezes. She has no rales.  Abdominal: Soft. Bowel sounds are normal. She exhibits no distension and no mass. There is no tenderness. There is no rebound.  Genitourinary:  Genitourinary Comments: Breast, GU, rectal exams were deferred at her request.  Musculoskeletal: Normal range of motion. She exhibits no edema, tenderness or deformity.  Lymphadenopathy:    She has no cervical adenopathy.  Neurological: She is alert and oriented to person, place, and time.  Skin: Skin is warm and dry. No rash noted. She is not diaphoretic. No erythema. No pallor.  Psychiatric: She has a normal mood and affect. Her behavior is normal. Judgment and thought content normal.  Vitals reviewed.   Lab Results  Component Value Date   WBC 7.1 01/13/2017   HGB 13.1 01/13/2017   HCT 40.1 01/13/2017   PLT 209 01/13/2017   GLUCOSE 99 01/13/2017   CHOL 167 04/19/2015   TRIG 75.0 04/19/2015   HDL 55.50 04/19/2015   LDLCALC 96 04/19/2015   ALT 15 05/16/2016   AST 12 05/16/2016   NA 138 01/13/2017   K 4.0 01/13/2017   CL 106 01/13/2017   CREATININE 0.61 01/13/2017   BUN 15 01/13/2017   CO2 26 01/13/2017   TSH 1.88 04/19/2015   HGBA1C 6.1 11/24/2012    US Breast Ltd Uni Left Inc Axilla  Result Date: 05/02/2017 CLINICAL DATA:  Annual mammography from right lumpectomy. EXAM: 2D DIGITAL DIAGNOSTIC BILATERAL MAMMOGRAM WITH CAD AND ADJUNCT TOMO ULTRASOUND BILATERAL BREAST COMPARISON:  Previous exam(s). ACR Breast Density Category c: The breast tissue is heterogeneously dense, which may obscure small masses. FINDINGS: A possible  small mass in the left breast, just medial to the nipple, at a mid depth resolves on spot imaging. No suspicious findings on the left. An asymmetry in the far medial right breast improves on additional imaging. This has the appearance of glandular tissue based on 3D imaging. The increased prominence on the initial mammogram may be technical in nature. Mammographic images were processed with CAD. On physical exam, no suspicious lumps are identified. Targeted ultrasound is performed, showing no suspicious abnormalities bilaterally. IMPRESSION: Probably benign asymmetry in the far medial right breast, improved with repeat imaging. No suspicious findings on the left. RECOMMENDATION: Recommend six-month follow-up mammography on the right to ensure stability or resolution of the probably benign asymmetry. I have discussed the findings and recommendations with the patient. Results were also provided in writing at the conclusion of the visit. If applicable, a reminder letter will be sent to the patient regarding the next appointment. BI-RADS CATEGORY  3: Probably benign. Electronically Signed   By: Dorise Bullion III M.D   On: 05/02/2017 11:15   US Breast Ltd Uni Right Inc Axilla  Result Date: 05/02/2017 CLINICAL DATA:  Annual mammography from right lumpectomy. EXAM: 2D DIGITAL DIAGNOSTIC BILATERAL MAMMOGRAM WITH CAD AND ADJUNCT TOMO ULTRASOUND  BILATERAL BREAST COMPARISON:  Previous exam(s). ACR Breast Density Category c: The breast tissue is heterogeneously dense, which may obscure small masses. FINDINGS: A possible small mass in the left breast, just medial to the nipple, at a mid depth resolves on spot imaging. No suspicious findings on the left. An asymmetry in the far medial right breast improves on additional imaging. This has the appearance of glandular tissue based on 3D imaging. The increased prominence on the initial mammogram may be technical in nature. Mammographic images were processed with CAD. On physical  exam, no suspicious lumps are identified. Targeted ultrasound is performed, showing no suspicious abnormalities bilaterally. IMPRESSION: Probably benign asymmetry in the far medial right breast, improved with repeat imaging. No suspicious findings on the left. RECOMMENDATION: Recommend six-month follow-up mammography on the right to ensure stability or resolution of the probably benign asymmetry. I have discussed the findings and recommendations with the patient. Results were also provided in writing at the conclusion of the visit. If applicable, a reminder letter will be sent to the patient regarding the next appointment. BI-RADS CATEGORY  3: Probably benign. Electronically Signed   By: Dorise Bullion III M.D   On: 05/02/2017 11:15   Mm Diag Breast Tomo Bilateral  Result Date: 05/02/2017 CLINICAL DATA:  Annual mammography from right lumpectomy. EXAM: 2D DIGITAL DIAGNOSTIC BILATERAL MAMMOGRAM WITH CAD AND ADJUNCT TOMO ULTRASOUND BILATERAL BREAST COMPARISON:  Previous exam(s). ACR Breast Density Category c: The breast tissue is heterogeneously dense, which may obscure small masses. FINDINGS: A possible small mass in the left breast, just medial to the nipple, at a mid depth resolves on spot imaging. No suspicious findings on the left. An asymmetry in the far medial right breast improves on additional imaging. This has the appearance of glandular tissue based on 3D imaging. The increased prominence on the initial mammogram may be technical in nature. Mammographic images were processed with CAD. On physical exam, no suspicious lumps are identified. Targeted ultrasound is performed, showing no suspicious abnormalities bilaterally. IMPRESSION: Probably benign asymmetry in the far medial right breast, improved with repeat imaging. No suspicious findings on the left. RECOMMENDATION: Recommend six-month follow-up mammography on the right to ensure stability or resolution of the probably benign asymmetry. I have  discussed the findings and recommendations with the patient. Results were also provided in writing at the conclusion of the visit. If applicable, a reminder letter will be sent to the patient regarding the next appointment. BI-RADS CATEGORY  3: Probably benign. Electronically Signed   By: Dorise Bullion III M.D   On: 05/02/2017 11:15    Assessment & Plan:   Mikaylee was seen today for hypertension, annual exam and uri.  Diagnoses and all orders for this visit:  Essential hypertension, benign- Her blood pressure is not adequately well controlled due to noncompliance.  She agrees to be compliant with the ARB and thiazide diuretic.  I will monitor her electrolytes and renal function. -     Comprehensive metabolic panel; Future -     CBC with Differential/Platelet; Future  Hyperlipidemia with target LDL less than 130- I will recheck her fasting lipid panel and will calculate an ASCVD risk score and will initiate statin therapy if indicated.  Goiter- She has no pain in the area and the exam today is normal.  I will monitor her TFTs. -     Thyroid Panel With TSH; Future  Routine general medical examination at a health care facility- Exam completed, labs reviewed, vaccines reviewed, screening for colon  cancer/Pap smear/mammogram are all up-to-date.  Patient education material was given. -     Lipid panel; Future -     HIV antibody; Future  Need for hepatitis C screening test -     Hepatitis C antibody; Future  Viral URI with cough- Based on her symptoms and exam this is viral in nature.  Antibiotic therapy is not indicated.  Will offer symptom relief with promethazine DM. -     promethazine-dextromethorphan (PROMETHAZINE-DM) 6.25-15 MG/5ML syrup; Take 5 mLs by mouth 4 (four) times daily as needed for cough.   I have discontinued Helene Kelp I. Rydberg's cetirizine, hydrOXYzine, and oxyCODONE-acetaminophen. I am also having her start on promethazine-dextromethorphan. Additionally, I am having her  maintain her acetaminophen, potassium chloride SA, tamoxifen, and valsartan-hydrochlorothiazide.  Meds ordered this encounter  Medications  . promethazine-dextromethorphan (PROMETHAZINE-DM) 6.25-15 MG/5ML syrup    Sig: Take 5 mLs by mouth 4 (four) times daily as needed for cough.    Dispense:  118 mL    Refill:  0     Follow-up: Return in about 6 weeks (around 07/07/2017).  [name], MD

## 2017-06-10 NOTE — Progress Notes (Signed)
ID: Brooke Wood OB: 1965-12-31  MR#: [id number]  MAU#:[id number]  PCP: [name], MD GYN:   SU:  OTHER MD:  CHIEF COMPLAINT: Estrogen receptor positive breast cancer  CURRENT THERAPY: Completing 5 years of tamoxifen   BREAST CANCER HISTORY:  From the original intake note:  Merelyn palpated a lump in the right breast in early November 2013. She was referred to the [facility] for a diagnostic mammogram and ultrasound and these were performed on 03/04/2012. The mass measured 2.8cm mammographically. The ultrasound showed a spiculated mass in the 11 o'clock location of the right breast, 8 cm from the nipple measuring 2.9 cm. The right axilla showed no adenopathy.   An ultrasound guided core biopsy of the right breast on 03/04/2012 revealed invasive ductal carcinoma, grade 2, ER positive, PR positive, HER-2/neu negative, Ki-67 20%. An MRI performed on 03/10/2012 showed the mass to be 2.7cm and irregular. No abnormalities were found in the left breast, or either axilla. Her case was discussed at the multidisciplinary breast clinic on 03/11/2012 and referred for genetic counseling and testing.   A right breast lumpectomy with sentinel lymph node biopsy was performed on 04/09/2012. 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 One sentinel node had isolated tumor cells. All margins clean.   The patient had an Oncotype DX performed. Her score was 10, giving her a 7% risk of distance recurrence with tamoxifen for 5 year. She was referred to radiation oncology. Her subsequent history is as follows.   INTERVAL HISTORY: [name] returns today for follow-up of her estrogen receptor positive breast cancer. She continues on Tamoxifen, with good tolerance. She notes that her hot flashes have gotten worse after her hysterectomy. She denies an increase in vaginal discharge.  Since her last visit, she  underwent diagnostic bilateral mammography with CAD and tomography and bilateral ultrasonography on 05/02/2017 at [facility] showing: breast density category C. Probably benign asymmetry in the far medial right breast, improved with repeat imaging. No suspicious findings on the left. No suspicious abnormalities bilaterally shown by sonography.   In addition on 01/22/2017 she underwent robotic assisted total hysterectomy with bilateral salpingectomy and left oophorectomy under Dr. Philis Pique.  The final pathology ([id number]) was benign. The right ovary was not removed    REVIEW OF SYSTEMS: Kimberlin reports that the right ovary was conserved during her hysterectomy. Her last period was October 2017. She is schedule for a 6 month follow up on her mammogram. She hasn't been exercising regularly, but [facility] She denies unusual headaches, visual changes, nausea, vomiting, or dizziness. There has been no unusual cough, phlegm production, or pleurisy. This been no change in bowel or bladder habits. She denies unexplained fatigue or unexplained weight loss, bleeding, rash, or fever. A detailed review of systems was otherwise stable.    PAST MEDICAL HISTORY: Past Medical History:  Diagnosis Date  . Allergy    Neg RAST 2007  . Breast cancer ([place]) 2014   right breast  . Hx of adenomatous colonic polyps 03/03/2014  . Hx of radiation therapy 06/15/12 - 07/29/12   right rbeast, 50.4 Gy x 28 fx, boost to cumulative dose 60.4 gray  . Hypertension   . Personal history of radiation therapy 2014  . PONV (postoperative nausea and vomiting)   . SVD (spontaneous vaginal delivery)    x 1  .  Vocal cord dysfunction    Neg Methacholine challenge test 2007   PAST SURGICAL HISTORY: Past Surgical History:  Procedure Laterality Date  . BREAST LUMPECTOMY Right 2014  . BREAST LUMPECTOMY WITH SENTINEL LYMPH NODE BIOPSY  04/09/2012   Procedure: BREAST LUMPECTOMY WITH SENTINEL LYMPH NODE  BX;  Surgeon: Merrie Roof, MD;  Location: [facility];  Service: General;  Laterality: Right;  . BREAST SURGERY  1985   rt br bx  . COLONOSCOPY  03/2014   poylps  . CYSTOSCOPY N/A 01/22/2017   Procedure: CYSTOSCOPY;  Surgeon: Bobbye Charleston, MD;  Location: [facility];  Service: Gynecology;  Laterality: N/A;  . ROBOTIC ASSISTED TOTAL HYSTERECTOMY WITH BILATERAL SALPINGO OOPHERECTOMY Bilateral 01/22/2017   Procedure: ROBOTIC ASSISTED TOTAL HYSTERECTOMY WITH BILATERAL SALPINGECTOMY AND LEFT OOPHORECTOMY;  Surgeon: Bobbye Charleston, MD;  Location: [facility];  Service: Gynecology;  Laterality: Bilateral;   FAMILY HISTORY Family History  Problem Relation Age of Onset  . Lung cancer Father 49       smoker  . Cancer Father        lung  . Hypertension Other   . Alcohol abuse Neg Hx   . Diabetes Neg Hx   . Early death Neg Hx   . Hearing loss Neg Hx   . Heart disease Neg Hx   . Hyperlipidemia Neg Hx   . Stroke Neg Hx   . Kidney disease Neg Hx   . Colon cancer Neg Hx   . Breast cancer Neg Hx   Her mother is still alive in her 59s and is in good health. Her father passed away when she was a teenager from lung cancer. Her 3 brothers are younger, and have no significant health history that she can recall.   GYNECOLOGIC HISTORY:  Menarche occurred at age 60. The patient is GX P1, giving birth to her son about age 40-25. Her last period was in 2017. She was on birth control pills for 10+ years. She is now s/p total hysterectomy with bilateral salpingectomy and left oophorectomy. The right ovary is still in place.  SOCIAL HISTORY: [name] lives at home alone. 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 Her son, Lovell Roe graduated with his masters degree in sports education and management from [employer] in May 2015. Her mother, [name], lives 5 minutes away and is  supportive. 2 of her brothers reside in [place], the other in [place]. She was married to her son's father for 9-10 years, but they got divorced in her 30s.  She is a Psychologist, forensic but "still needs to find a good church home."    ADVANCED DIRECTIVES: Not in place at this time. She intends to name her son, Nysa Sarin, as her healthcare power of attorney. "[name]" can be reached at [phone].   HEALTH MAINTENANCE: Social History   Tobacco Use  . Smoking status: Never Smoker  . Smokeless tobacco: Never Used  Substance Use Topics  . Alcohol use: No    Alcohol/week: 0.0 oz  . Drug use: No    Colonoscopy: "needs to schedule when insurance obtained" Bone Density Scan: never Pap Smear: UTD/2013 Lipid Panel:    Allergies  Allergen Reactions  . Aspirin Hives    Pt is able to take ibuprofen without problems  . Codeine Other (See Comments)    "confusion"  Current Outpatient Medications  Medication Sig Dispense Refill  . acetaminophen (TYLENOL) 325 MG tablet Take 650 mg by mouth every 6 (six) hours as needed for mild pain or headache.    . potassium chloride SA (K-DUR,KLOR-CON) 20 MEQ tablet TAKE 1 TABLET BY MOUTH 3 TIMES DAILY 90 tablet 3  . promethazine-dextromethorphan (PROMETHAZINE-DM) 6.25-15 MG/5ML syrup Take 5 mLs by mouth 4 (four) times daily as needed for cough. 118 mL 0  . tamoxifen (NOLVADEX) 20 MG tablet Take 1 tablet (20 mg total) by mouth daily. 30 tablet 0  . valsartan-hydrochlorothiazide (DIOVAN-HCT) 160-25 MG tablet TAKE 1 TABLET BY MOUTH ONCE DAILY 90 tablet 0   No current facility-administered medications for this visit.     OBJECTIVE: Middle-aged African-American woman in no acute distress Vitals:   06/12/17 1010  BP: (!) 151/69  Pulse: 100  Resp: 20  Temp: 98.1 F (36.7 C)  SpO2: 100%     Body mass index is 27.06 kg/m.  ECOG FS:0 - Asymptomatic  ROS   Physical Exam`  Sclerae unicteric, EOMs intact No cervical or supraclavicular  adenopathy Lungs no rales or rhonchi Heart regular rate and rhythm Abd soft, nontender, positive bowel sounds MSK no focal spinal tenderness, no upper extremity lymphedema Neuro: nonfocal, well oriented, appropriate affect Breasts: The right breast is status post lumpectomy and radiation.  There is no evidence of local recurrence.  The left breast is benign.  Both axillae are benign.   LAB RESULTS: Lab Results  Component Value Date   WBC 5.8 06/12/2017   HGB 13.1 01/13/2017   HCT 39.6 06/12/2017   MCV 87.2 06/12/2017   PLT 218 06/12/2017     Chemistry      Component Value Date/Time   NA 138 01/13/2017 1120   NA 138 05/16/2016 1006   K 4.0 01/13/2017 1120   K 3.4 (L) 05/16/2016 1006   CL 106 01/13/2017 1120   CL 98 07/24/2012 1248   CO2 26 01/13/2017 1120   CO2 27 05/16/2016 1006   BUN 15 01/13/2017 1120   BUN 17.3 05/16/2016 1006   CREATININE 0.61 01/13/2017 1120   CREATININE 0.7 05/16/2016 1006      Component Value Date/Time   CALCIUM 9.1 01/13/2017 1120   CALCIUM 9.7 05/16/2016 1006   ALKPHOS 98 05/16/2016 1006   AST 12 05/16/2016 1006   ALT 15 05/16/2016 1006   BILITOT 0.25 05/16/2016 1006      Urinalysis No results found for: COLORURINE  STUDIES: Since her last visit, she underwent diagnostic bilateral mammography with CAD and tomography and bilateral ultrasonography on 05/02/2017 at [facility] showing: breast density category C. Probably benign asymmetry in the far medial right breast, improved with repeat imaging.  Six-month follow-up scheduled for 10/30/2017  ASSESSMENT:  52 y.o. [place] woman   (1) status post right breast biopsy on 03/11/2012 for an invasive ductal carcinoma with tubular features, grade 2, ER positive, PR positive, HER-2/neu negative, Ki-67 20%  (2) status post definitive right lumpectomy and sentinel lymph node biopsy on 04/09/2012. This revealed a 2.8 cm invasive tubulo- lobular carcinoma,T2 N0(i+), stage IIA, grade 2, ER  99%, PR 4%, HER-2/neu negative, Ki-67 60%. One sentinel node had isolated tumor cells. All margins clean.   (a) in the 2018 calcification her tumor would be stage IB  (3) Oncotype DX performed. Her score was 10, giving her a 7% risk of outside the breast recurrence if her only systemic therapy is tamoxifen for 5 years  (4) She  completed radiation therapy in April 2015  (5) She began tamoxifen in May 2014 completing 5 years April 2019  (6) status post hysterectomy with left salpingo-oophorectomy January 22, 2017, with benign pathology  PLAN:  Neeta is now a little over 5 years out from definitive surgery for her breast cancer with no evidence of disease recurrence.  This is very favorable.  She will complete 5 years of tamoxifen at the end of April of this year.  At that point she will stop that medication.  We discussed continued follow-up here versus transitioning to survivorship versus "getting out of the cancer business".  She preferred the idea of survivorship so I have made her an appointment with our survivorship nurse practitioner for October of this year.  We discussed exercise issues.  Unfortunately she does not have a gym membership.  She will check with her insurance to see if they cover "silver sneakers".  I did give her information on her yoga classes on the evenings of Wednesdays and on the trails to recovery program, both of which are free  At this point I feel comfortable releasing her to her primary care physician.  All she will need in terms of breast cancer follow-up is yearly mammography and a yearly physician breast exam  I will be glad to see her again at any point in the future but as of now we are making no further routine appointments with me for her here.   Mantaj Chamberlin, Virgie Dad, MD  06/12/17 10:28 AM Medical Oncology and Hematology [facility] [address] [place], [place] [address] Tel. [phone]    Fax. [phone]  This document serves  as a record of services personally performed by Lurline Del, MD. It was created on his behalf by Sheron Nightingale, a trained medical scribe. The creation of this record is based on the scribe's personal observations and the provider's statements to them.   I have reviewed the above documentation for accuracy and completeness, and I agree with the above.

## 2017-06-11 ENCOUNTER — Other Ambulatory Visit: Payer: Self-pay | Admitting: *Deleted

## 2017-06-11 DIAGNOSIS — Z170 Estrogen receptor positive status [ER+]: Secondary | ICD-10-CM

## 2017-06-11 DIAGNOSIS — C50411 Malignant neoplasm of upper-outer quadrant of right female breast: Secondary | ICD-10-CM

## 2017-06-12 ENCOUNTER — Inpatient Hospital Stay: Payer: BLUE CROSS/BLUE SHIELD | Attending: Oncology | Admitting: Oncology

## 2017-06-12 ENCOUNTER — Inpatient Hospital Stay: Payer: BLUE CROSS/BLUE SHIELD

## 2017-06-12 ENCOUNTER — Telehealth: Payer: Self-pay | Admitting: Adult Health

## 2017-06-12 VITALS — BP 151/69 | HR 100 | Temp 98.1°F | Resp 20 | Ht 65.0 in | Wt 162.6 lb

## 2017-06-12 DIAGNOSIS — Z801 Family history of malignant neoplasm of trachea, bronchus and lung: Secondary | ICD-10-CM | POA: Insufficient documentation

## 2017-06-12 DIAGNOSIS — Z923 Personal history of irradiation: Secondary | ICD-10-CM | POA: Insufficient documentation

## 2017-06-12 DIAGNOSIS — Z90710 Acquired absence of both cervix and uterus: Secondary | ICD-10-CM | POA: Insufficient documentation

## 2017-06-12 DIAGNOSIS — C50911 Malignant neoplasm of unspecified site of right female breast: Secondary | ICD-10-CM

## 2017-06-12 DIAGNOSIS — Z90722 Acquired absence of ovaries, bilateral: Secondary | ICD-10-CM | POA: Diagnosis not present

## 2017-06-12 DIAGNOSIS — Z86010 Personal history of colonic polyps: Secondary | ICD-10-CM | POA: Insufficient documentation

## 2017-06-12 DIAGNOSIS — Z170 Estrogen receptor positive status [ER+]: Secondary | ICD-10-CM

## 2017-06-12 DIAGNOSIS — Z79810 Long term (current) use of selective estrogen receptor modulators (SERMs): Secondary | ICD-10-CM | POA: Insufficient documentation

## 2017-06-12 DIAGNOSIS — C50411 Malignant neoplasm of upper-outer quadrant of right female breast: Secondary | ICD-10-CM

## 2017-06-12 DIAGNOSIS — R232 Flushing: Secondary | ICD-10-CM | POA: Diagnosis not present

## 2017-06-12 DIAGNOSIS — Z79899 Other long term (current) drug therapy: Secondary | ICD-10-CM | POA: Insufficient documentation

## 2017-06-12 DIAGNOSIS — I10 Essential (primary) hypertension: Secondary | ICD-10-CM | POA: Diagnosis not present

## 2017-06-12 LAB — CMP (CANCER CENTER ONLY)
ALT: 15 U/L (ref 0–55)
ANION GAP: 7 (ref 3–11)
AST: 11 U/L (ref 5–34)
Albumin: 3.7 g/dL (ref 3.5–5.0)
Alkaline Phosphatase: 114 U/L (ref 40–150)
BUN: 12 mg/dL (ref 7–26)
CHLORIDE: 105 mmol/L (ref 98–109)
CO2: 28 mmol/L (ref 22–29)
CREATININE: 0.67 mg/dL (ref 0.60–1.10)
Calcium: 9.3 mg/dL (ref 8.4–10.4)
Glucose, Bld: 80 mg/dL (ref 70–140)
Potassium: 4 mmol/L (ref 3.5–5.1)
SODIUM: 140 mmol/L (ref 136–145)
Total Bilirubin: 0.4 mg/dL (ref 0.2–1.2)
Total Protein: 6.9 g/dL (ref 6.4–8.3)

## 2017-06-12 LAB — CBC WITH DIFFERENTIAL (CANCER CENTER ONLY)
Basophils Absolute: 0 10*3/uL (ref 0.0–0.1)
Basophils Relative: 1 %
EOS ABS: 0 10*3/uL (ref 0.0–0.5)
Eosinophils Relative: 1 %
HEMATOCRIT: 39.6 % (ref 34.8–46.6)
HEMOGLOBIN: 13 g/dL (ref 11.6–15.9)
LYMPHS ABS: 2.3 10*3/uL (ref 0.9–3.3)
Lymphocytes Relative: 39 %
MCH: 28.6 pg (ref 25.1–34.0)
MCHC: 32.8 g/dL (ref 31.5–36.0)
MCV: 87.2 fL (ref 79.5–101.0)
MONOS PCT: 10 %
Monocytes Absolute: 0.6 10*3/uL (ref 0.1–0.9)
NEUTROS ABS: 2.9 10*3/uL (ref 1.5–6.5)
NEUTROS PCT: 49 %
Platelet Count: 218 10*3/uL (ref 145–400)
RBC: 4.54 MIL/uL (ref 3.70–5.45)
RDW: 13.2 % (ref 11.2–14.5)
WBC: 5.8 10*3/uL (ref 3.9–10.3)

## 2017-06-12 MED ORDER — TAMOXIFEN CITRATE 20 MG PO TABS: 20.0000 mg | ORAL_TABLET | Freq: Every day | ORAL | 0 refills | Status: DC

## 2017-06-12 MED FILL — TAMOXIFEN CITRATE 20 MG TAB: 20 | 30 days supply | Qty: 30 | Fill #3

## 2017-06-12 NOTE — Telephone Encounter (Signed)
Gave avs and calendar ° °

## 2017-07-07 ENCOUNTER — Ambulatory Visit: Payer: BLUE CROSS/BLUE SHIELD | Admitting: Internal Medicine

## 2017-08-04 ENCOUNTER — Telehealth: Payer: Self-pay | Admitting: Internal Medicine

## 2017-08-04 NOTE — Telephone Encounter (Signed)
LVM for patient to call back and make appt  °

## 2017-08-04 NOTE — Telephone Encounter (Signed)
Copied from [place] [phone]. Topic: Quick Communication - See Telephone Encounter >> Aug 04, 2017 10:17 AM Synthia Innocent wrote: CRM for notification. See Telephone encounter for: 08/04/17. Requesting something to be called in for allergies. Has a cough and stuffy nose. [employer]

## 2017-08-04 NOTE — Telephone Encounter (Signed)
Pt needs an appt if she wants an rx.

## 2017-08-14 ENCOUNTER — Encounter: Payer: Self-pay | Admitting: Radiation Oncology

## 2017-08-14 ENCOUNTER — Ambulatory Visit
Admission: RE | Admit: 2017-08-14 | Discharge: 2017-08-14 | Disposition: A | Discharge status: Home / Self Care | Payer: BLUE CROSS/BLUE SHIELD | Source: Ambulatory Visit | Attending: Radiation Oncology | Admitting: Radiation Oncology

## 2017-08-14 ENCOUNTER — Other Ambulatory Visit: Payer: Self-pay

## 2017-08-14 VITALS — BP 128/93 | HR 93 | Temp 98.2°F | Resp 18 | Wt 163.2 lb

## 2017-08-14 DIAGNOSIS — Z79899 Other long term (current) drug therapy: Secondary | ICD-10-CM | POA: Insufficient documentation

## 2017-08-14 DIAGNOSIS — Z170 Estrogen receptor positive status [ER+]: Secondary | ICD-10-CM | POA: Diagnosis not present

## 2017-08-14 DIAGNOSIS — Z923 Personal history of irradiation: Secondary | ICD-10-CM | POA: Diagnosis not present

## 2017-08-14 DIAGNOSIS — Z08 Encounter for follow-up examination after completed treatment for malignant neoplasm: Secondary | ICD-10-CM | POA: Diagnosis not present

## 2017-08-14 DIAGNOSIS — C50411 Malignant neoplasm of upper-outer quadrant of right female breast: Secondary | ICD-10-CM | POA: Insufficient documentation

## 2017-08-14 DIAGNOSIS — M7989 Other specified soft tissue disorders: Secondary | ICD-10-CM | POA: Diagnosis not present

## 2017-08-14 DIAGNOSIS — L988 Other specified disorders of the skin and subcutaneous tissue: Secondary | ICD-10-CM | POA: Diagnosis not present

## 2017-08-14 DIAGNOSIS — R2231 Localized swelling, mass and lump, right upper limb: Secondary | ICD-10-CM | POA: Diagnosis not present

## 2017-08-14 DIAGNOSIS — Z853 Personal history of malignant neoplasm of breast: Secondary | ICD-10-CM | POA: Diagnosis not present

## 2017-08-14 NOTE — Progress Notes (Signed)
Brooke Wood presents for her follow up appointment. Denies any pain or issues with range of motion. Notes some mild swelling to the right forearm but states it's tolerable. Reports improved energy levels since stopping the Tamoxifen. Denies any skin itching or irritation. On assessment skin appears smooth and uniform in color with a mild reddened area to the chest.

## 2017-08-14 NOTE — Progress Notes (Signed)
  Radiation Oncology         [phone] ________________________________  Name: Brooke Wood MRN: [id number]  Date: 08/14/2017  DOB: 01/10/1966  Follow-Up Visit Note  CC: [name], MD  Marcy Panning, MD    ICD-10-CM   1. Malignant neoplasm of upper-outer quadrant of right breast in female, estrogen receptor positive ([place]) C50.411    Z17.0     Diagnosis:   Stage IIA invasive tubulo- lobular carcinoma of the right breast  Interval Since Last Radiation:  5 years ago  06/15/2012 - 07/29/2012 50.4 Gy directed to the right breast in 28 fractions. The site of presentation (lumpectomy cavity) in the upper outer quadrant of the right breast was boosted to a cumulative dose of 60.4 Gy.  Narrative: Brooke Wood presents for her follow up appointment. Denies any pain or issues with range of motion. Notes some mild swelling to the right forearm but states it's tolerable. Reports improved energy levels since stopping the Tamoxifen. Denies any skin itching or irritation. On assessment skin appears smooth and uniform in color with a mild reddened area to the chest. Patient reports having urinary symptoms.                             ALLERGIES:  is allergic to aspirin and codeine.  Meds: Current Outpatient Medications  Medication Sig Dispense Refill  . potassium chloride SA (K-DUR,KLOR-CON) 20 MEQ tablet TAKE 1 TABLET BY MOUTH 3 TIMES DAILY 90 tablet 3  . valsartan-hydrochlorothiazide (DIOVAN-HCT) 160-25 MG tablet TAKE 1 TABLET BY MOUTH ONCE DAILY 90 tablet 0   No current facility-administered medications for this encounter.     Physical Findings: The patient is in no acute distress. Patient is alert and oriented.  weight is 163 lb 4 oz (74 kg). Her oral temperature is 98.2 F (36.8 C). Her blood pressure is 128/93 (abnormal) and her pulse is 93. Her respiration is 18 and oxygen saturation is 100%. .  No significant changes.Lungs are clear to auscultation bilaterally. Heart has regular  rate and rhythm. No palpable cervical, supraclavicular, or axillary adenopathy. Abdomen soft, non-tender, normal bowel sounds. Patiens right breast mild hyperpigmentation changes, no palpable mass, nipple discharge or bleeding. Patients left breast no palpable mass, nipple discharge or bleeding.   Lab Findings: Lab Results  Component Value Date   WBC 5.8 06/12/2017   HGB 13.0 06/12/2017   HCT 39.6 06/12/2017   MCV 87.2 06/12/2017   PLT 218 06/12/2017    Radiographic Findings: No results found.  Impression:   No evidence or reoccurrence or significant changes.  Plan:  PRN follow up with Radiation oncology. She will continue follow-up with primary care and survivorship. ____________________________________  Blair Promise, PhD, MD  This document serves as a record of services personally performed by Gery Pray MD. It was created on his behalf by Delton Coombes, a trained medical scribe. The creation of this record is based on the scribe's personal observations and the provider's statements to them.

## 2017-08-27 ENCOUNTER — Telehealth: Payer: Self-pay | Admitting: Internal Medicine

## 2017-08-27 MED FILL — POTASSIUM CL ER 20 MEQ TABL: 20 | 30 days supply | Qty: 90 | Fill #2

## 2017-08-27 NOTE — Telephone Encounter (Signed)
Pt is due for a follow up.  

## 2017-08-27 NOTE — Telephone Encounter (Signed)
Spoke with patient. She will call back to make an appointment when she is able to check her schedule.

## 2017-08-28 MED ORDER — VALSARTAN-HYDROCHLOROTHIAZIDE 160-25 MG PO TABS: 1.0000 | ORAL_TABLET | Freq: Every day | ORAL | 0 refills | Status: DC

## 2017-08-28 MED FILL — VALSARTAN-HCTZ 160-25 MG TA: 160-25 | 30 days supply | Qty: 30 | Fill #0

## 2017-08-28 NOTE — Telephone Encounter (Signed)
Erx sent to [facility]

## 2017-08-28 NOTE — Addendum Note (Signed)
Addended by: Aviva Signs M on: 08/28/2017 10:08 AM   Modules accepted: Orders

## 2017-08-28 NOTE — Telephone Encounter (Signed)
Pt made appointment for 6/19 and wants to know if she can get bp med refilled today.   valsartan-hydrochlorothiazide (DIOVAN-HCT) 160-25 MG tablet  [place], [place] - [place] [phone] (Phone) [phone] (Fax)

## 2017-09-17 ENCOUNTER — Ambulatory Visit: Payer: BLUE CROSS/BLUE SHIELD | Admitting: Internal Medicine

## 2017-10-08 ENCOUNTER — Ambulatory Visit: Payer: BLUE CROSS/BLUE SHIELD | Admitting: Internal Medicine

## 2017-10-08 ENCOUNTER — Other Ambulatory Visit (INDEPENDENT_AMBULATORY_CARE_PROVIDER_SITE_OTHER): Payer: BLUE CROSS/BLUE SHIELD

## 2017-10-08 ENCOUNTER — Encounter: Payer: Self-pay | Admitting: Internal Medicine

## 2017-10-08 VITALS — BP 148/96 | HR 98 | Temp 98.2°F | Resp 16 | Ht 65.0 in | Wt 166.0 lb

## 2017-10-08 DIAGNOSIS — R0600 Dyspnea, unspecified: Secondary | ICD-10-CM

## 2017-10-08 DIAGNOSIS — E049 Nontoxic goiter, unspecified: Secondary | ICD-10-CM

## 2017-10-08 DIAGNOSIS — E785 Hyperlipidemia, unspecified: Secondary | ICD-10-CM

## 2017-10-08 DIAGNOSIS — Z1159 Encounter for screening for other viral diseases: Secondary | ICD-10-CM | POA: Diagnosis not present

## 2017-10-08 DIAGNOSIS — R9431 Abnormal electrocardiogram [ECG] [EKG]: Secondary | ICD-10-CM | POA: Diagnosis not present

## 2017-10-08 DIAGNOSIS — I10 Essential (primary) hypertension: Secondary | ICD-10-CM | POA: Diagnosis not present

## 2017-10-08 DIAGNOSIS — R0609 Other forms of dyspnea: Secondary | ICD-10-CM | POA: Diagnosis not present

## 2017-10-08 LAB — CBC WITH DIFFERENTIAL/PLATELET
BASOS ABS: 0 10*3/uL (ref 0.0–0.1)
BASOS PCT: 0.3 % (ref 0.0–3.0)
Eosinophils Absolute: 0 10*3/uL (ref 0.0–0.7)
Eosinophils Relative: 0.7 % (ref 0.0–5.0)
HCT: 40 % (ref 36.0–46.0)
HEMOGLOBIN: 13.3 g/dL (ref 12.0–15.0)
LYMPHS PCT: 45.2 % (ref 12.0–46.0)
Lymphs Abs: 3.4 10*3/uL (ref 0.7–4.0)
MCHC: 33.3 g/dL (ref 30.0–36.0)
MCV: 87 fl (ref 78.0–100.0)
MONO ABS: 0.8 10*3/uL (ref 0.1–1.0)
Monocytes Relative: 10.8 % (ref 3.0–12.0)
Neutro Abs: 3.2 10*3/uL (ref 1.4–7.7)
Neutrophils Relative %: 43 % (ref 43.0–77.0)
PLATELETS: 219 10*3/uL (ref 150.0–400.0)
RBC: 4.6 Mil/uL (ref 3.87–5.11)
RDW: 13.8 % (ref 11.5–15.5)
WBC: 7.5 10*3/uL (ref 4.0–10.5)

## 2017-10-08 LAB — THYROID PANEL WITH TSH
Free Thyroxine Index: 2 (ref 1.4–3.8)
T3 Uptake: 26 % (ref 22–35)
T4, Total: 7.8 ug/dL (ref 5.1–11.9)
TSH: 1.15 m[IU]/L

## 2017-10-08 LAB — COMPREHENSIVE METABOLIC PANEL
ALBUMIN: 4.1 g/dL (ref 3.5–5.2)
ALK PHOS: 132 U/L — AB (ref 39–117)
ALT: 20 U/L (ref 0–35)
AST: 16 U/L (ref 0–37)
BILIRUBIN TOTAL: 0.3 mg/dL (ref 0.2–1.2)
BUN: 16 mg/dL (ref 6–23)
CALCIUM: 9.5 mg/dL (ref 8.4–10.5)
CHLORIDE: 104 meq/L (ref 96–112)
CO2: 31 mEq/L (ref 19–32)
CREATININE: 1 mg/dL (ref 0.40–1.20)
GFR: 74.2 mL/min (ref >60.00)
Glucose, Bld: 98 mg/dL (ref 70–99)
Potassium: 3.9 mEq/L (ref 3.5–5.1)
Sodium: 141 mEq/L (ref 135–145)
TOTAL PROTEIN: 7 g/dL (ref 6.0–8.3)

## 2017-10-08 MED ORDER — VALSARTAN-HYDROCHLOROTHIAZIDE 160-25 MG PO TABS: 1.0000 | ORAL_TABLET | Freq: Every day | ORAL | 0 refills | Status: DC

## 2017-10-08 MED FILL — VALSARTAN-HCTZ 160-25 MG TA: 160-25 | 90 days supply | Qty: 90 | Fill #0

## 2017-10-08 NOTE — Patient Instructions (Signed)

## 2017-10-08 NOTE — Progress Notes (Signed)
Subjective:  Patient ID: Brooke Wood, female    DOB: 1966-01-12  Age: 52 y.o. MRN: [id number]  CC: Hypertension   HPI KAYLYNN CHAMBLIN presents for a BP check - She has not taken her antihypertensive for 3 days.  She said she ran out.  She complains over the last 2 months that she has developed DOE.  She denies CP, dizziness, lightheadedness, palpitations, edema, or fatigue.  Outpatient Medications Prior to Visit  Medication Sig Dispense Refill  . potassium chloride SA (K-DUR,KLOR-CON) 20 MEQ tablet TAKE 1 TABLET BY MOUTH 3 TIMES DAILY 90 tablet 3  . valsartan-hydrochlorothiazide (DIOVAN-HCT) 160-25 MG tablet Take 1 tablet by mouth daily. (Patient not taking: Reported on 10/08/2017) 30 tablet 0   No facility-administered medications prior to visit.     ROS Review of Systems  Constitutional: Negative for diaphoresis, fatigue and unexpected weight change.  HENT: Negative.   Eyes: Negative for visual disturbance.  Respiratory: Positive for shortness of breath (DOE). Negative for apnea, cough, chest tightness and wheezing.   Cardiovascular: Negative for chest pain, palpitations and leg swelling.  Gastrointestinal: Negative for abdominal pain, constipation, diarrhea, nausea and vomiting.  Endocrine: Negative.   Genitourinary: Negative.  Negative for decreased urine volume, difficulty urinating, dysuria, frequency and urgency.  Musculoskeletal: Negative.  Negative for arthralgias, back pain, myalgias and neck pain.  Skin: Negative.  Negative for color change, pallor and rash.  Allergic/Immunologic: Negative.   Neurological: Negative.  Negative for dizziness, syncope, weakness, light-headedness and headaches.  Hematological: Negative for adenopathy. Does not bruise/bleed easily.    Objective:  BP (!) 148/96 (BP Location: Left Arm, Patient Position: Sitting, Cuff Size: Normal)   Pulse 98   Temp 98.2 F (36.8 C) (Oral)   Resp 16   Ht 5\' 5"  (1.651 m)   Wt 166 lb (75.3 kg)   LMP  01/07/2016   SpO2 99%   BMI 27.62 kg/m   BP Readings from Last 3 Encounters:  10/08/17 (!) 148/96  08/14/17 (!) 128/93  06/12/17 (!) 151/69    Wt Readings from Last 3 Encounters:  10/08/17 166 lb (75.3 kg)  08/14/17 163 lb 4 oz (74 kg)  06/12/17 162 lb 9.6 oz (73.8 kg)    Physical Exam  Constitutional: She is oriented to person, place, and time. No distress.  HENT:  Mouth/Throat: Oropharynx is clear and moist. No oropharyngeal exudate.  Eyes: Conjunctivae are normal. No scleral icterus.  Neck: Normal range of motion. Neck supple. No JVD present. No thyromegaly present.  Cardiovascular: Normal rate, regular rhythm and normal heart sounds. Exam reveals no gallop.  No murmur heard. EKG ---  Sinus  Rhythm  -Short PR syndrome  PRi = 110 -Right bundle branch block.   ABNORMAL - no change from the prior EKG  Pulmonary/Chest: Effort normal and breath sounds normal. No stridor. No tachypnea. No respiratory distress. She has no wheezes. She has no rhonchi. She has no rales.  Abdominal: Soft. Normal appearance and bowel sounds are normal. She exhibits no mass. There is no hepatosplenomegaly. There is no tenderness. No hernia.  Musculoskeletal: Normal range of motion. She exhibits no edema, tenderness or deformity.  Lymphadenopathy:    She has no cervical adenopathy.  Neurological: She is alert and oriented to person, place, and time.  Skin: Skin is warm and dry. She is not diaphoretic. No erythema. No pallor.  Vitals reviewed.   Lab Results  Component Value Date   WBC 7.5 10/08/2017   HGB 13.3 10/08/2017  HCT 40.0 10/08/2017   PLT 219.0 10/08/2017   GLUCOSE 98 10/08/2017   CHOL 167 04/19/2015   TRIG 75.0 04/19/2015   HDL 55.50 04/19/2015   LDLCALC 96 04/19/2015   ALT 20 10/08/2017   AST 16 10/08/2017   NA 141 10/08/2017   K 3.9 10/08/2017   CL 104 10/08/2017   CREATININE 1.00 10/08/2017   BUN 16 10/08/2017   CO2 31 10/08/2017   TSH 1.15 10/08/2017   HGBA1C 6.1  11/24/2012    No results found.  Assessment & Plan:   Jeweliana was seen today for hypertension.  Diagnoses and all orders for this visit:  DOE (dyspnea on exertion)- see below -     EKG 12-Lead -     MYOCARDIAL PERFUSION IMAGING; Future  Essential hypertension, benign- Her blood pressure is not adequately well controlled.  I have asked her to restart the ARB and thiazide diuretic.  I will also screen her for vitamin D deficiency. -     valsartan-hydrochlorothiazide (DIOVAN-HCT) 160-25 MG tablet; Take 1 tablet by mouth daily. -     VITAMIN D 25 Hydroxy (Vit-D Deficiency, Fractures); Future  Hyperlipidemia with target LDL less than 130- She has a low ASCVD risk score at 6.5%.  At this time I do not recommend a statin for CV risk reduction.  If she is found to have ischemia then I will recommend a statin.  Abnormal electrocardiogram (ECG) (EKG)- Her EKG shows a right bundle branch block so she is not a candidate for exercise tolerance testing.  She has developed DOE so I have asked her to undergo a myocardial perfusion imaging to screen for ischemia. -     MYOCARDIAL PERFUSION IMAGING; Future  Abnormal electrocardiogram -     MYOCARDIAL PERFUSION IMAGING; Future   I am having Brooke Wood maintain her potassium chloride SA and valsartan-hydrochlorothiazide.  Meds ordered this encounter  Medications  . valsartan-hydrochlorothiazide (DIOVAN-HCT) 160-25 MG tablet    Sig: Take 1 tablet by mouth daily.    Dispense:  90 tablet    Refill:  0     Follow-up: Return in about 6 weeks (around 11/19/2017).  [name], MD

## 2017-10-09 LAB — HEPATITIS C ANTIBODY
HEP C AB: NONREACTIVE
SIGNAL TO CUT-OFF: 0.03 (ref <1.00)

## 2017-10-16 ENCOUNTER — Telehealth: Payer: Self-pay | Admitting: Internal Medicine

## 2017-10-16 NOTE — Telephone Encounter (Signed)
Copied from [place] [phone]. Topic: Referral - Status >> Oct 16, 2017  8:22 AM Cora Daniels D wrote: Reason for CRM: Unable to leave msg. Pt needs to contact heart care to schedule stress test [phone] >> Oct 16, 2017 10:32 AM Kristine Royal J wrote: Please let know te phone number is [phone] to schedule  >> Oct 16, 2017  3:50 PM Carolyn Stare wrote: Pt call to say she contacted [facility] and they told her she need a referral      Patient called and said that she needs a referral sent to [facility] for her to be seen at their office. Please submite and return pt call back.

## 2017-10-17 ENCOUNTER — Telehealth (HOSPITAL_COMMUNITY): Payer: Self-pay

## 2017-10-17 NOTE — Telephone Encounter (Signed)
Encounter complete. 

## 2017-10-17 NOTE — Telephone Encounter (Signed)
Patient has called back.  I have informed her that a stress test has been ordered.

## 2017-10-17 NOTE — Telephone Encounter (Signed)
Dr. Ronnald Ramp has ordered a stress test. This will need to be completed first.   Call number listed for pt and mail box is full.

## 2017-10-21 ENCOUNTER — Telehealth (HOSPITAL_COMMUNITY): Payer: Self-pay

## 2017-10-21 NOTE — Telephone Encounter (Signed)
Unable to reach patient. Encounter complete. 

## 2017-10-21 NOTE — Telephone Encounter (Signed)
Close encounter 

## 2017-10-22 ENCOUNTER — Ambulatory Visit (HOSPITAL_COMMUNITY)
Admission: RE | Admit: 2017-10-22 | Discharge: 2017-10-22 | Disposition: A | Discharge status: Home / Self Care | Payer: BLUE CROSS/BLUE SHIELD | Source: Ambulatory Visit | Attending: Cardiology | Admitting: Cardiology

## 2017-10-22 DIAGNOSIS — R9431 Abnormal electrocardiogram [ECG] [EKG]: Secondary | ICD-10-CM | POA: Diagnosis not present

## 2017-10-22 DIAGNOSIS — R0609 Other forms of dyspnea: Secondary | ICD-10-CM | POA: Insufficient documentation

## 2017-10-22 DIAGNOSIS — R0600 Dyspnea, unspecified: Secondary | ICD-10-CM

## 2017-10-22 LAB — MYOCARDIAL PERFUSION IMAGING
CHL CUP NUCLEAR SDS: 2
CHL CUP NUCLEAR SRS: 0
CHL CUP RESTING HR STRESS: 74 {beats}/min
LV dias vol: 52 mL (ref 46–106)
LV sys vol: 13 mL
NUC STRESS TID: 1.09
Peak HR: 120 {beats}/min
SSS: 2

## 2017-10-22 MED ORDER — TECHNETIUM TC 99M TETROFOSMIN IV KIT
9.7000 | PACK | Freq: Once | INTRAVENOUS | Status: AC | PRN
  Administered 2017-10-22: 9.7 via INTRAVENOUS
  Filled 2017-10-22: qty 10

## 2017-10-22 MED ORDER — TECHNETIUM TC 99M TETROFOSMIN IV KIT
30.6000 | PACK | Freq: Once | INTRAVENOUS | Status: AC | PRN
  Administered 2017-10-22: 30.6 via INTRAVENOUS
  Filled 2017-10-22: qty 31

## 2017-10-22 MED ORDER — REGADENOSON 0.4 MG/5ML IV SOLN
0.4000 mg | Freq: Once | INTRAVENOUS | Status: AC
  Administered 2017-10-22: 0.4 mg via INTRAVENOUS

## 2017-10-30 ENCOUNTER — Ambulatory Visit
Admission: RE | Admit: 2017-10-30 | Discharge: 2017-10-30 | Disposition: A | Discharge status: Home / Self Care | Payer: BLUE CROSS/BLUE SHIELD | Source: Ambulatory Visit | Attending: Oncology | Admitting: Oncology

## 2017-10-30 DIAGNOSIS — N6489 Other specified disorders of breast: Secondary | ICD-10-CM

## 2017-10-30 DIAGNOSIS — R928 Other abnormal and inconclusive findings on diagnostic imaging of breast: Secondary | ICD-10-CM | POA: Diagnosis not present

## 2017-10-30 DIAGNOSIS — Z853 Personal history of malignant neoplasm of breast: Secondary | ICD-10-CM

## 2017-11-19 ENCOUNTER — Ambulatory Visit: Payer: BLUE CROSS/BLUE SHIELD | Admitting: Internal Medicine

## 2017-12-26 ENCOUNTER — Telehealth: Payer: Self-pay

## 2017-12-26 NOTE — Telephone Encounter (Signed)
Spoke to pt reminding of SCP visit on 12/30/17 at 10 am with LC.

## 2017-12-30 ENCOUNTER — Encounter: Payer: Self-pay | Admitting: Adult Health

## 2017-12-30 ENCOUNTER — Inpatient Hospital Stay: Payer: BLUE CROSS/BLUE SHIELD | Attending: Adult Health | Admitting: Adult Health

## 2017-12-30 ENCOUNTER — Telehealth: Payer: Self-pay | Admitting: Adult Health

## 2017-12-30 VITALS — BP 154/98 | HR 84 | Temp 98.2°F | Resp 48 | Ht 65.0 in | Wt 167.3 lb

## 2017-12-30 DIAGNOSIS — C50411 Malignant neoplasm of upper-outer quadrant of right female breast: Secondary | ICD-10-CM

## 2017-12-30 DIAGNOSIS — Z86010 Personal history of colonic polyps: Secondary | ICD-10-CM | POA: Insufficient documentation

## 2017-12-30 DIAGNOSIS — Z90710 Acquired absence of both cervix and uterus: Secondary | ICD-10-CM | POA: Diagnosis not present

## 2017-12-30 DIAGNOSIS — Z79899 Other long term (current) drug therapy: Secondary | ICD-10-CM | POA: Diagnosis not present

## 2017-12-30 DIAGNOSIS — Z1239 Encounter for other screening for malignant neoplasm of breast: Secondary | ICD-10-CM

## 2017-12-30 DIAGNOSIS — Z90722 Acquired absence of ovaries, bilateral: Secondary | ICD-10-CM | POA: Diagnosis not present

## 2017-12-30 DIAGNOSIS — I10 Essential (primary) hypertension: Secondary | ICD-10-CM

## 2017-12-30 DIAGNOSIS — Z801 Family history of malignant neoplasm of trachea, bronchus and lung: Secondary | ICD-10-CM | POA: Diagnosis not present

## 2017-12-30 DIAGNOSIS — Z923 Personal history of irradiation: Secondary | ICD-10-CM | POA: Diagnosis not present

## 2017-12-30 DIAGNOSIS — Z853 Personal history of malignant neoplasm of breast: Secondary | ICD-10-CM

## 2017-12-30 DIAGNOSIS — Z170 Estrogen receptor positive status [ER+]: Secondary | ICD-10-CM

## 2017-12-30 NOTE — Progress Notes (Signed)
CLINIC:  Survivorship   REASON FOR VISIT:  Routine follow-up for history of breast cancer.   BRIEF ONCOLOGIC HISTORY:   (1) status post right breast biopsy on 03/11/2012 for an invasive ductal carcinoma with tubular features, grade 2, ER positive, PR positive, HER-2/neu negative, Ki-67 20%  (2) status post definitive right lumpectomy and sentinel lymph node biopsy on 04/09/2012. 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 One sentinel node had isolated tumor cells. All margins clean.              (a) in the 2018 calcification her tumor would be stage IB  (3) Oncotype DX performed. Her score was 10, giving her a 7% risk of outside the breast recurrence if her only systemic therapy is tamoxifen for 5 years  (4) She completed radiation therapy in April 2015  (5) She began tamoxifen in May 2014 completing 5 years April 2019  (6) status post hysterectomy with left salpingo-oophorectomy January 22, 2017, with benign pathology  INTERVAL HISTORY:  Ms. Harpenau presents to the [facility] today for routine follow-up for her history of breast cancer.  Overall, she reports feeling quite well.   She has finished Tamoxifen in April of this year and is doing quite well.  She doesn't have breast concerns.  She sees her PCP regularly.  She has colon cancer screening regularly.  She says her PCP looks at her skin.  She recently underwent hysterectomy, but will f/u with GYN in the next few months.  She is a never smoker.  [place] stands on her feet for longer periods of time at work.  She does not exercise.  She works part time.      REVIEW OF SYSTEMS:  Review of Systems  Constitutional: Negative for appetite change, chills, fatigue, fever and unexpected weight change.  HENT:   Negative for hearing loss, lump/mass and trouble swallowing.   Eyes: Negative for eye problems and icterus.  Respiratory:  Negative for chest tightness, cough and shortness of breath.   Cardiovascular: Negative for chest pain, leg swelling and palpitations.  Gastrointestinal: Negative for abdominal distention, abdominal pain, constipation, diarrhea, nausea and vomiting.  Endocrine: Negative for hot flashes.  Skin: Negative for itching and rash.  Neurological: Negative for dizziness, extremity weakness, headaches and numbness.  Hematological: Negative for adenopathy. Does not bruise/bleed easily.   Breast: Denies any new nodularity, masses, tenderness, nipple changes, or nipple discharge.       PAST MEDICAL/SURGICAL HISTORY:  Past Medical History:  Diagnosis Date  . Allergy    Neg RAST 2007  . Breast cancer ([place]) 2014   right breast  . Hx of adenomatous colonic polyps 03/03/2014  . Hx of radiation therapy 06/15/12 - 07/29/12   right rbeast, 50.4 Gy x 28 fx, boost to cumulative dose 60.4 gray  . Hypertension   . Personal history of radiation therapy 2014  . PONV (postoperative nausea and vomiting)   . SVD (spontaneous vaginal delivery)    x 1  . Vocal cord dysfunction    Neg Methacholine challenge test 2007   Past Surgical History:  Procedure Laterality Date  . BREAST LUMPECTOMY Right 2014  . BREAST LUMPECTOMY WITH SENTINEL LYMPH NODE BIOPSY  04/09/2012   Procedure: BREAST LUMPECTOMY WITH SENTINEL LYMPH NODE BX;  Surgeon: Merrie Roof, MD;  Location: [facility];  Service: General;  Laterality: Right;  . BREAST SURGERY  1985   rt br bx  . COLONOSCOPY  03/2014   poylps  . CYSTOSCOPY N/A 01/22/2017   Procedure: CYSTOSCOPY;  Surgeon: Bobbye Charleston, MD;  Location: [facility];  Service: Gynecology;  Laterality: N/A;  . ROBOTIC ASSISTED TOTAL HYSTERECTOMY WITH BILATERAL SALPINGO OOPHERECTOMY Bilateral 01/22/2017   Procedure: ROBOTIC ASSISTED TOTAL HYSTERECTOMY WITH BILATERAL SALPINGECTOMY AND LEFT OOPHORECTOMY;  Surgeon: Bobbye Charleston, MD;  Location: [facility];  Service: Gynecology;   Laterality: Bilateral;     ALLERGIES:  Allergies  Allergen Reactions  . Aspirin Hives    Pt is able to take ibuprofen without problems  . Codeine Other (See Comments)    "confusion"     CURRENT MEDICATIONS:  Outpatient Encounter Medications as of 12/30/2017  Medication Sig  . potassium chloride SA (K-DUR,KLOR-CON) 20 MEQ tablet TAKE 1 TABLET BY MOUTH 3 TIMES DAILY  . valsartan-hydrochlorothiazide (DIOVAN-HCT) 160-25 MG tablet Take 1 tablet by mouth daily.   No facility-administered encounter medications on file as of 12/30/2017.      ONCOLOGIC FAMILY HISTORY:  Family History  Problem Relation Age of Onset  . Lung cancer Father 67       smoker  . Cancer Father        lung  . Hypertension Other   . Alcohol abuse Neg Hx   . Diabetes Neg Hx   . Early death Neg Hx   . Hearing loss Neg Hx   . Heart disease Neg Hx   . Hyperlipidemia Neg Hx   . Stroke Neg Hx   . Kidney disease Neg Hx   . Colon cancer Neg Hx   . Breast cancer Neg Hx     GENETIC COUNSELING/TESTING: Not at this time  SOCIAL HISTORY:  Social History   Socioeconomic History  . Marital status: Single    Spouse name: Not on file  . Number of children: Not on file  . Years of education: Not on file  . Highest education level: Not on file  Occupational History    Employer: UNEMPLOYED    Comment: [employer]  . Financial resource strain: Not on file  . Food insecurity:    Worry: Not on file    Inability: Not on file  . Transportation needs:    Medical: Not on file    Non-medical: Not on file  Tobacco Use  . Smoking status: Never Smoker  . Smokeless tobacco: Never Used  Substance and Sexual Activity  . Alcohol use: No  . Drug use: No  . Sexual activity: Not Currently    Birth control/protection: None  Lifestyle  . Physical activity:    Days per week: Not on file    Minutes per session: Not on file  . Stress: Not on file  Relationships  . Social connections:    Talks on  phone: Not on file    Gets together: Not on file    Attends religious service: Not on file    Active member of club or organization: Not on file    Attends meetings of clubs or organizations: Not on file    Relationship status: Not on file  . Intimate partner violence:    Fear of current or ex partner: Not on file    Emotionally abused: Not on file    Physically abused: Not on file    Forced sexual activity: Not on file  Other Topics Concern  . Not on file  Social History Narrative   Regular exercise-No  PHYSICAL EXAMINATION:  Vital Signs: Vitals:   12/30/17 1053  BP: (!) 154/98  Pulse: 84  Resp: (!) 48  Temp: 98.2 F (36.8 C)  SpO2: 99%   Filed Weights   12/30/17 1053  Weight: 167 lb 4.8 oz (75.9 kg)   General: Well-nourished, well-appearing female in no acute distress.  Unaccompanied today.   HEENT: Head is normocephalic.  Pupils equal and reactive to light. Conjunctivae clear without exudate.  Sclerae anicteric. Oral mucosa is pink, moist.  Oropharynx is pink without lesions or erythema.  Lymph: No cervical, supraclavicular, or infraclavicular lymphadenopathy noted on palpation.  Cardiovascular: Regular rate and rhythm.Marland Kitchen Respiratory: Clear to auscultation bilaterally. Chest expansion symmetric; breathing non-labored.  Breast Exam:  -Left breast: No appreciable masses on palpation. No skin redness, thickening, or peau d'orange appearance; no nipple retraction or nipple discharge;  -Right breast: No appreciable masses on palpation. No skin redness, thickening, or peau d'orange appearance; no nipple retraction or nipple discharge; mild distortion in symmetry at previous lumpectomy site well healed scar without erythema or nodularity. -Axilla: No axillary adenopathy bilaterally.  GI: Abdomen soft and round; non-tender, non-distended. Bowel sounds normoactive. No hepatosplenomegaly.   GU: Deferred.  Neuro: No focal deficits. Steady gait.  Psych: Mood and affect normal  and appropriate for situation.  MSK: No focal spinal tenderness to palpation, full range of motion in bilateral upper extremities Extremities: No edema. Skin: Warm and dry.  LABORATORY DATA:  None for this visit   DIAGNOSTIC IMAGING:  Most recent mammogram:     ASSESSMENT AND PLAN:  Ms.. Noack is a pleasant 52 y.o. 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  [facility] for [facility] and routine follow-up.   1. History of breast cancer:  Ms. Tibbs is currently clinically and radiographically without evidence of disease or recurrence of breast cancer. She will be due for mammogram in 10/2018; orders placed today.  She will return in one year for LTS follow up.  I encouraged her to call me with any questions or concerns before her next visit at the cancer center, and I would be happy to see her sooner, if needed.    2. Bone health:  Given Ms. Fry's age, history of breast cancer, she is at risk for bone demineralization.  I counseled her that the tamoxifen she previously took does have a protective effect on the bone.   She was given education on specific food and activities to promote bone health.  She should f/u with her PCP regarding bone health and bone density management.  3. Cancer screening:  Due to Ms. Ress's history and her age, she should receive screening for skin cancers, colon cancer, and gynecologic cancers. She was encouraged to follow-up with her PCP for appropriate cancer screenings.   4. Health maintenance and wellness promotion: Ms. Udall was encouraged to consume 5-7 servings of fruits and vegetables per day. She was also encouraged to engage in moderate to vigorous exercise for 30 minutes per day most days of the week. She was instructed to  limit her alcohol consumption and continue to abstain from tobacco use.   Dispo:  -Return to cancer center in one year for LTS followup -mammogram in 10/2018   A total of (30) minutes of face-to-face time was spent with this patient with greater than 50% of that time  in counseling and care-coordination.   Gardenia Phlegm, [place] [phone]   Note: PRIMARY CARE PROVIDER [name], [place] [phone]

## 2017-12-30 NOTE — Patient Instructions (Signed)
Bone Health Bones protect organs, store calcium, and anchor muscles. Good health habits, such as eating nutritious foods and exercising regularly, are important for maintaining healthy bones. They can also help to prevent a condition that causes bones to lose density and become weak and brittle (osteoporosis). Why is bone mass important? Bone mass refers to the amount of bone tissue that you have. The higher your bone mass, the stronger your bones. An important step toward having healthy bones throughout life is to have strong and dense bones during childhood. A young adult who has a high bone mass is more likely to have a high bone mass later in life. Bone mass at its greatest it is called peak bone mass. A large decline in bone mass occurs in older adults. In women, it occurs about the time of menopause. During this time, it is important to practice good health habits, because if more bone is lost than what is replaced, the bones will become less healthy and more likely to break (fracture). If you find that you have a low bone mass, you may be able to prevent osteoporosis or further bone loss by changing your diet and lifestyle. How can I find out if my bone mass is low? Bone mass can be measured with an X-ray test that is called a bone mineral density (BMD) test. This test is recommended for all women who are age 65 or older. It may also be recommended for men who are age 70 or older, or for people who are more likely to develop osteoporosis due to:  Having bones that break easily.  Having a long-term disease that weakens bones, such as kidney disease or rheumatoid arthritis.  Having menopause earlier than normal.  Taking medicine that weakens bones, such as steroids, thyroid hormones, or hormone treatment for breast cancer or prostate cancer.  Smoking.  Drinking three or more alcoholic drinks each day.  What are the nutritional recommendations for healthy bones? To have healthy bones, you  need to get enough of the right minerals and vitamins. Most nutrition experts recommend getting these nutrients from the foods that you eat. Nutritional recommendations vary from person to person. Ask your health care provider what is healthy for you. Here are some general guidelines. Calcium Recommendations Calcium is the most important (essential) mineral for bone health. Most people can get enough calcium from their diet, but supplements may be recommended for people who are at risk for osteoporosis. Good sources of calcium include:  Dairy products, such as low-fat or nonfat milk, cheese, and yogurt.  Dark green leafy vegetables, such as bok choy and broccoli.  Calcium-fortified foods, such as orange juice, cereal, bread, soy beverages, and tofu products.  Nuts, such as almonds.  Follow these recommended amounts for daily calcium intake:  Children, age 1?3: 700 mg.  Children, age 4?8: 1,000 mg.  Children, age 9?13: 1,300 mg.  Teens, age 14?18: 1,300 mg.  Adults, age 19?50: 1,000 mg.  Adults, age 51?70: ? Men: 1,000 mg. ? Women: 1,200 mg.  Adults, age 71 or older: 1,200 mg.  Pregnant and breastfeeding females: ? Teens: 1,300 mg. ? Adults: 1,000 mg.  Vitamin D Recommendations Vitamin D is the most essential vitamin for bone health. It helps the body to absorb calcium. Sunlight stimulates the skin to make vitamin D, so be sure to get enough sunlight. If you live in a cold climate or you do not get outside often, your health care provider may recommend that you take vitamin   D supplements. Good sources of vitamin D in your diet include:  Egg yolks.  Saltwater fish.  Milk and cereal fortified with vitamin D.  Follow these recommended amounts for daily vitamin D intake:  Children and teens, age 1?18: 600 international units.  Adults, age 50 or younger: 400-800 international units.  Adults, age 51 or older: 800-1,000 international units.  Other Nutrients Other nutrients  for bone health include:  Phosphorus. This mineral is found in meat, poultry, dairy foods, nuts, and legumes. The recommended daily intake for adult men and adult women is 700 mg.  Magnesium. This mineral is found in seeds, nuts, dark green vegetables, and legumes. The recommended daily intake for adult men is 400?420 mg. For adult women, it is 310?320 mg.  Vitamin K. This vitamin is found in green leafy vegetables. The recommended daily intake is 120 mg for adult men and 90 mg for adult women.  What type of physical activity is best for building and maintaining healthy bones? Weight-bearing and strength-building activities are important for building and maintaining peak bone mass. Weight-bearing activities cause muscles and bones to work against gravity. Strength-building activities increases muscle strength that supports bones. Weight-bearing and muscle-building activities include:  Walking and hiking.  Jogging and running.  Dancing.  Gym exercises.  Lifting weights.  Tennis and racquetball.  Climbing stairs.  Aerobics.  Adults should get at least 30 minutes of moderate physical activity on most days. Children should get at least 60 minutes of moderate physical activity on most days. Ask your health care provide what type of exercise is best for you. Where can I find more information? For more information, check out the following websites:  National Osteoporosis Foundation: http://nof.org/learn/basics  National Institutes of Health: http://www.niams.nih.gov/Health_Info/Bone/Bone_Health/bone_health_for_life.asp  This information is not intended to replace advice given to you by your health care provider. Make sure you discuss any questions you have with your health care provider. Document Released: 06/08/2003 Document Revised: 10/06/2015 Document Reviewed: 03/23/2014 Elsevier Interactive Patient Education  2018 Elsevier Inc.  

## 2017-12-30 NOTE — Telephone Encounter (Signed)
Gave avs and calendar ° °

## 2018-01-13 ENCOUNTER — Other Ambulatory Visit: Payer: Self-pay | Admitting: Internal Medicine

## 2018-01-13 DIAGNOSIS — I10 Essential (primary) hypertension: Secondary | ICD-10-CM

## 2018-01-13 MED FILL — VALSARTAN-HCTZ 160-25 MG TA: 160-25 | 90 days supply | Qty: 90 | Fill #0

## 2018-01-13 MED FILL — POTASSIUM CL ER 20 MEQ TABL: 20 | 30 days supply | Qty: 90 | Fill #3

## 2018-02-23 ENCOUNTER — Ambulatory Visit: Payer: Self-pay

## 2018-02-24 ENCOUNTER — Ambulatory Visit: Payer: Self-pay

## 2018-02-24 NOTE — Telephone Encounter (Signed)
rec'd phone call from patient with c/o cough and stuffy nose since Friday.  Reported her cough is "mild". Reported has phlegm that she can cough up so far, but not able to bring it out.  Denied fever. Stated has had some chills.  Reported stuffy nose; denied nasal drainage, since taking "allergy medication."  Denied shortness of breath or wheezing. Denied any other symptoms.  Advised on Home care treatment.  Care advice per protocol.  Recommended discussing cough syrup and nasal decongestant choices with her pharmacist, since she has high blood pressure. Pt. Verb. Understanding. She will call back if her symptoms worsen.        Reason for Disposition . Cough  Answer Assessment - Initial Assessment Questions 1. ONSET: "When did the cough begin?"      Since last Friday; 2/22 2. SEVERITY: "How bad is the cough today?"      Mild 3. RESPIRATORY DISTRESS: "Describe your breathing."      Denied shortness of breath 4. FEVER: "Do you have a fever?" If so, ask: "What is your temperature, how was it measured, and when did it start?"     Chills; no temp.  5. SPUTUM: "Describe the color of your sputum" (clear, white, yellow, green)     Unable to get all the way up  6. HEMOPTYSIS: "Are you coughing up any blood?" If so ask: "How much?" (flecks, streaks, tablespoons, etc.)     n/a 7. CARDIAC HISTORY: "Do you have any history of heart disease?" (e.g., heart attack, congestive heart failure)      Denied  8. LUNG HISTORY: "Do you have any history of lung disease?"  (e.g., pulmonary embolus, asthma, emphysema)     Denied  9. PE RISK FACTORS: "Do you have a history of blood clots?" (or: recent major surgery, recent prolonged travel, bedridden)     n/a 10. OTHER SYMPTOMS: "Do you have any other symptoms?" (e.g., runny nose, wheezing, chest pain)       Stuffy nose ; no nasal drainage ; denied wheezing; denied headache; denied sore throat or ear ache   11. PREGNANCY: "Is there any chance you are pregnant?" "When  was your last menstrual period?"      Partial hysterectomy 12. TRAVEL: "Have you traveled out of the country in the last month?" (e.g., travel history, exposures)       Denied  Protocols used: [place]

## 2018-03-03 ENCOUNTER — Encounter: Payer: Self-pay | Admitting: Internal Medicine

## 2018-03-03 ENCOUNTER — Ambulatory Visit: Payer: BLUE CROSS/BLUE SHIELD | Admitting: Internal Medicine

## 2018-03-03 VITALS — BP 164/92 | HR 87 | Temp 98.1°F | Resp 16 | Ht 65.0 in | Wt 168.0 lb

## 2018-03-03 DIAGNOSIS — I10 Essential (primary) hypertension: Secondary | ICD-10-CM

## 2018-03-03 DIAGNOSIS — Z23 Encounter for immunization: Secondary | ICD-10-CM | POA: Diagnosis not present

## 2018-03-03 DIAGNOSIS — J0100 Acute maxillary sinusitis, unspecified: Secondary | ICD-10-CM | POA: Diagnosis not present

## 2018-03-03 MED ORDER — AMOXICILLIN-POT CLAVULANATE 875-125 MG PO TABS: 1.0000 | ORAL_TABLET | Freq: Two times a day (BID) | ORAL | 0 refills | Status: AC

## 2018-03-03 MED ORDER — PROMETHAZINE-DM 6.25-15 MG/5ML PO SYRP: 5.0000 mL | ORAL_SOLUTION | Freq: Four times a day (QID) | ORAL | 0 refills | Status: AC | PRN

## 2018-03-03 MED FILL — PROMETHAZINE W/DM SYRUP: 6.25-15 | 6 days supply | Qty: 118 | Fill #0

## 2018-03-03 MED FILL — AMOX-CLAV 875-125 MG TABLET: 875-125 | 10 days supply | Qty: 20 | Fill #0

## 2018-03-03 NOTE — Patient Instructions (Signed)

## 2018-03-03 NOTE — Progress Notes (Signed)
Subjective:  Patient ID: Brooke Wood, female    DOB: Mar 22, 1966  Age: 52 y.o. MRN: [id number]  CC: Hypertension and URI   HPI Brooke Wood presents for a one-week history of sore throat, facial pain, nonproductive cough, and thick yellow nasal phlegm.  She has not taken her antihypertensive for 2 days.  She is not sure why.  Outpatient Medications Prior to Visit  Medication Sig Dispense Refill  . potassium chloride SA (K-DUR,KLOR-CON) 20 MEQ tablet TAKE 1 TABLET BY MOUTH 3 TIMES DAILY 90 tablet 3  . valsartan-hydrochlorothiazide (DIOVAN-HCT) 160-25 MG tablet TAKE 1 TABLET BY MOUTH ONCE DAILY 90 tablet 0   No facility-administered medications prior to visit.     ROS Review of Systems  Constitutional: Negative for chills, fatigue and fever.  HENT: Positive for congestion, postnasal drip, rhinorrhea, sinus pressure, sinus pain and sore throat. Negative for facial swelling, trouble swallowing and voice change.   Eyes: Negative.   Respiratory: Positive for cough. Negative for chest tightness, shortness of breath and wheezing.   Cardiovascular: Negative for chest pain, palpitations and leg swelling.  Gastrointestinal: Negative for abdominal pain, constipation, diarrhea and nausea.  Endocrine: Negative.   Genitourinary: Negative.  Negative for difficulty urinating.  Musculoskeletal: Negative.   Skin: Negative.   Neurological: Negative.  Negative for dizziness, weakness and light-headedness.  Hematological: Negative for adenopathy. Does not bruise/bleed easily.  Psychiatric/Behavioral: Negative.     Objective:  BP (!) 164/92 (BP Location: Left Arm, Patient Position: Sitting, Cuff Size: Normal)   Pulse 87   Temp 98.1 F (36.7 C) (Oral)   Resp 16   Ht 5\' 5"  (1.651 m)   Wt 168 lb (76.2 kg)   LMP 01/07/2016   SpO2 99%   BMI 27.96 kg/m   BP Readings from Last 3 Encounters:  03/03/18 (!) 164/92  12/30/17 (!) 154/98  10/08/17 (!) 148/96    Wt Readings from Last 3  Encounters:  03/03/18 168 lb (76.2 kg)  12/30/17 167 lb 4.8 oz (75.9 kg)  10/22/17 166 lb (75.3 kg)    Physical Exam  Constitutional: She is oriented to person, place, and time. No distress.  HENT:  Nose: Rhinorrhea present. No mucosal edema. No epistaxis. Right sinus exhibits maxillary sinus tenderness. Right sinus exhibits no frontal sinus tenderness. Left sinus exhibits maxillary sinus tenderness. Left sinus exhibits no frontal sinus tenderness.  Mouth/Throat: Posterior oropharyngeal erythema present. No oropharyngeal exudate, posterior oropharyngeal edema or tonsillar abscesses.  Eyes: Conjunctivae are normal. No scleral icterus.  Neck: Normal range of motion. Neck supple. No JVD present. No thyromegaly present.  Cardiovascular: Normal rate, regular rhythm and normal heart sounds. Exam reveals no friction rub.  No murmur heard. Pulmonary/Chest: Effort normal and breath sounds normal. No respiratory distress. She has no wheezes. She has no rhonchi. She has no rales.  Abdominal: Soft. Bowel sounds are normal. She exhibits no mass. There is no hepatosplenomegaly. There is no tenderness.  Musculoskeletal: Normal range of motion. She exhibits no edema, tenderness or deformity.  Neurological: She is alert and oriented to person, place, and time.  Skin: Skin is warm and dry. She is not diaphoretic. No pallor.  Vitals reviewed.   Lab Results  Component Value Date   WBC 7.5 10/08/2017   HGB 13.3 10/08/2017   HCT 40.0 10/08/2017   PLT 219.0 10/08/2017   GLUCOSE 98 10/08/2017   CHOL 167 04/19/2015   TRIG 75.0 04/19/2015   HDL 55.50 04/19/2015   LDLCALC 96 04/19/2015  ALT 20 10/08/2017   AST 16 10/08/2017   NA 141 10/08/2017   K 3.9 10/08/2017   CL 104 10/08/2017   CREATININE 1.00 10/08/2017   BUN 16 10/08/2017   CO2 31 10/08/2017   TSH 1.15 10/08/2017   HGBA1C 6.1 11/24/2012    Mm Diag Breast Tomo Uni Right  Result Date: 10/30/2017 CLINICAL DATA:  Six-month follow-up of a  right breast asymmetry. EXAM: DIGITAL DIAGNOSTIC UNILATERAL RIGHT MAMMOGRAM WITH CAD AND TOMO COMPARISON:  Previous exam(s). ACR Breast Density Category b: There are scattered areas of fibroglandular density. FINDINGS: The right breast asymmetry is not present on today's study. No evidence of malignancy in the right breast. The right lumpectomy site is stable. Mammographic images were processed with CAD. IMPRESSION: No mammographic evidence of malignancy. RECOMMENDATION: The patient's breast cancer is 5 years ago when she may return to screening mammography. We discussed the differences between screening and diagnostic mammography. At this point, she will return to screening mammograms. I have discussed the findings and recommendations with the patient. Results were also provided in writing at the conclusion of the visit. If applicable, a reminder letter will be sent to the patient regarding the next appointment. BI-RADS CATEGORY  2: Benign. Electronically Signed   By: Dorise Bullion III M.D   On: 10/30/2017 10:01    Assessment & Plan:   Brooke Wood was seen today for hypertension and uri.  Diagnoses and all orders for this visit:  Acute non-recurrent maxillary sinusitis -     amoxicillin-clavulanate (AUGMENTIN) 875-125 MG tablet; Take 1 tablet by mouth 2 (two) times daily for 10 days. -     promethazine-dextromethorphan (PROMETHAZINE-DM) 6.25-15 MG/5ML syrup; Take 5 mLs by mouth 4 (four) times daily as needed for up to 7 days for cough.  Essential hypertension, benign- Her blood pressure is not adequately well controlled due to noncompliance.  She agrees to restart her antihypertensive.  Need for influenza vaccination -     Flu Vaccine QUAD 36+ mos IM   I am having Brooke Wood start on amoxicillin-clavulanate and promethazine-dextromethorphan. I am also having her maintain her potassium chloride SA and valsartan-hydrochlorothiazide.  Meds ordered this encounter  Medications  .  amoxicillin-clavulanate (AUGMENTIN) 875-125 MG tablet    Sig: Take 1 tablet by mouth 2 (two) times daily for 10 days.    Dispense:  20 tablet    Refill:  0  . promethazine-dextromethorphan (PROMETHAZINE-DM) 6.25-15 MG/5ML syrup    Sig: Take 5 mLs by mouth 4 (four) times daily as needed for up to 7 days for cough.    Dispense:  118 mL    Refill:  0     Follow-up: Return in about 4 weeks (around 03/31/2018).  [name], MD

## 2018-04-09 ENCOUNTER — Telehealth: Payer: Self-pay | Admitting: General Practice

## 2018-04-09 NOTE — Telephone Encounter (Signed)
[facility] CSW Progress Notes  Request received from [facility] to contact patient to discuss possible resources to help w increased cost of health insurance premium.  Called patient, states if she pays her premium she will be unable to pay for rent or food.  Advised to call insurance counseling program at [employer] [facility] [place] for possible help, also advised to look into applying for Medicaid.  Patient is no longer in active treatment for cancer, so options for support of patients w breast cancer are not available to patient at this time.  Edwyna Shell, LCSW Clinical Social Worker Phone:  [phone]

## 2018-04-13 ENCOUNTER — Other Ambulatory Visit: Payer: Self-pay | Admitting: Internal Medicine

## 2018-04-13 DIAGNOSIS — I10 Essential (primary) hypertension: Secondary | ICD-10-CM

## 2018-04-14 ENCOUNTER — Telehealth: Payer: Self-pay | Admitting: Internal Medicine

## 2018-04-14 DIAGNOSIS — I10 Essential (primary) hypertension: Secondary | ICD-10-CM

## 2018-04-14 MED ORDER — VALSARTAN-HYDROCHLOROTHIAZIDE 160-25 MG PO TABS: 1.0000 | ORAL_TABLET | Freq: Every day | ORAL | 0 refills | Status: DC

## 2018-04-14 MED FILL — VALSARTAN-HCTZ 160-25 MG TA: 160-25 | 30 days supply | Qty: 30 | Fill #0

## 2018-04-14 NOTE — Telephone Encounter (Signed)
Patient has scheduled a medication follow up appt for 05/07/18 at 9:00am. She has 1 pill left for tonight and would like to know if enough medication to be called in to get her to appointment.

## 2018-04-14 NOTE — Addendum Note (Signed)
Addended by: Aviva Signs M on: 04/14/2018 12:58 PM   Modules accepted: Orders

## 2018-04-14 NOTE — Telephone Encounter (Signed)
30 day supply sent

## 2018-05-04 ENCOUNTER — Ambulatory Visit
Admission: RE | Admit: 2018-05-04 | Discharge: 2018-05-04 | Disposition: A | Discharge status: Home / Self Care | Payer: BLUE CROSS/BLUE SHIELD | Source: Ambulatory Visit | Attending: Adult Health | Admitting: Adult Health

## 2018-05-04 DIAGNOSIS — Z1231 Encounter for screening mammogram for malignant neoplasm of breast: Secondary | ICD-10-CM | POA: Diagnosis not present

## 2018-05-04 DIAGNOSIS — Z1239 Encounter for other screening for malignant neoplasm of breast: Secondary | ICD-10-CM

## 2018-05-05 MED ORDER — VALSARTAN-HYDROCHLOROTHIAZIDE 160-25 MG PO TABS: 1.0000 | ORAL_TABLET | Freq: Every day | ORAL | 0 refills | Status: DC

## 2018-05-05 NOTE — Telephone Encounter (Signed)
erx has been sent.  

## 2018-05-05 NOTE — Addendum Note (Signed)
Addended by: Karle Barr on: 05/05/2018 04:13 PM   Modules accepted: Orders

## 2018-05-05 NOTE — Telephone Encounter (Signed)
Patient was scheduled for 05/07/2018 but had to be rescheduled due to Dr Ronnald Ramp being out sick. Patient was advised that next available opening is in one month. She will check her work schedule to see what days she is suppose to work and will call back to reschedule this visit. She asked if enough of this medication could be sent in to get her through until the first of March. Please advise.

## 2018-05-07 ENCOUNTER — Ambulatory Visit: Payer: BLUE CROSS/BLUE SHIELD | Admitting: Internal Medicine

## 2018-05-15 MED FILL — VALSARTAN-HCTZ 160-25 MG TA: 160-25 | 30 days supply | Qty: 30 | Fill #0

## 2018-05-16 ENCOUNTER — Emergency Department (HOSPITAL_COMMUNITY)
Admission: EM | Admit: 2018-05-16 | Discharge: 2018-05-16 | Disposition: A | Discharge status: Home / Self Care | Payer: BLUE CROSS/BLUE SHIELD | Attending: Emergency Medicine | Admitting: Emergency Medicine

## 2018-05-16 ENCOUNTER — Other Ambulatory Visit: Payer: Self-pay

## 2018-05-16 ENCOUNTER — Encounter (HOSPITAL_COMMUNITY): Payer: Self-pay

## 2018-05-16 DIAGNOSIS — Z79899 Other long term (current) drug therapy: Secondary | ICD-10-CM | POA: Insufficient documentation

## 2018-05-16 DIAGNOSIS — I10 Essential (primary) hypertension: Secondary | ICD-10-CM | POA: Insufficient documentation

## 2018-05-16 DIAGNOSIS — L259 Unspecified contact dermatitis, unspecified cause: Secondary | ICD-10-CM | POA: Insufficient documentation

## 2018-05-16 DIAGNOSIS — R21 Rash and other nonspecific skin eruption: Secondary | ICD-10-CM | POA: Diagnosis not present

## 2018-05-16 MED ORDER — HYDROCORTISONE 1 % EX CREA: TOPICAL_CREAM | CUTANEOUS | 0 refills | Status: DC

## 2018-05-16 MED ORDER — PREDNISONE 20 MG PO TABS
60.0000 mg | ORAL_TABLET | Freq: Once | ORAL | Status: AC
  Administered 2018-05-16: 60 mg via ORAL
  Filled 2018-05-16: qty 3

## 2018-05-16 MED ORDER — LORATADINE 10 MG PO TABS: 10.0000 mg | ORAL_TABLET | Freq: Every day | ORAL | 0 refills | Status: DC

## 2018-05-16 NOTE — Discharge Instructions (Signed)
Please read instructions below. Take 25mg  of benadryl every 6 hours, and 20mg  of pepcid every 12 hours for rash and itching.  You can take a claritin during the day instead of benadryl for avoid drowsiness. Apply the steroid cream as directed. Schedule an appointment with your primary care to follow up. Return to the ER immediately for feeling your throat closing, swelling of your lips or tongue, difficulty breathing, pus draining from wound, fever, or new or concerning symptoms.

## 2018-05-16 NOTE — ED Triage Notes (Signed)
Pt reports right wrist swelling, redness and small blisters for the past couple days. Denies fever

## 2018-05-16 NOTE — ED Notes (Signed)
Patient verbalizes understanding of discharge instructions. Opportunity for questioning and answers were provided. Armband removed by staff, pt discharged from ED.  

## 2018-05-16 NOTE — ED Provider Notes (Signed)
[facility] EMERGENCY DEPARTMENT Provider Note   CSN: [id number] Arrival date & time: 05/16/18  1807     History   Chief Complaint Chief Complaint  Patient presents with  . Arm Swelling  . Rash    HPI Brooke Wood is a 53 y.o. female past medical history of hypertension, presenting to the emergency department with complaint of itchy rash to her right wrist that began today.  Patient states this morning she noticed a few bumps to her wrist, however throughout the day the rash became more itchy with some associated swelling.  She noticed a clear liquid drainage from the rash.  States recently she has been using a brand-new fragrance oil which she put on her clothes, along her wrists.  She also states she uses a little cut through to get to work and might of wrapped her arm along a bush.  No purulent drainage. Rash is not painful at all.  No swelling of the lips or tongue.  The history is provided by the patient.    Past Medical History:  Diagnosis Date  . Allergy    Neg RAST 2007  . Breast cancer ([place]) 2014   right breast  . Hx of adenomatous colonic polyps 03/03/2014  . Hx of radiation therapy 06/15/12 - 07/29/12   right rbeast, 50.4 Gy x 28 fx, boost to cumulative dose 60.4 gray  . Hypertension   . Personal history of radiation therapy 2014  . PONV (postoperative nausea and vomiting)   . SVD (spontaneous vaginal delivery)    x 1  . Vocal cord dysfunction    Neg Methacholine challenge test 2007    Patient Active Problem List   Diagnosis Date Noted  . Abnormal electrocardiogram (ECG) (EKG) 10/08/2017  . Acute non-recurrent maxillary sinusitis 03/12/2016  . Lymphedema of upper extremity 11/10/2014  . Hx of adenomatous colonic polyps 03/03/2014  . Routine general medical examination at a health care facility 06/03/2013  . Hyperlipidemia with target LDL less than 130 11/24/2012  . Malignant neoplasm of upper-outer quadrant of right breast in female,  estrogen receptor positive ([place]) 03/06/2012  . Goiter 07/26/2009  . VOCAL CORD DISORDER 07/24/2009  . Essential hypertension, benign 01/19/2007  . Allergic rhinitis 01/19/2007    Past Surgical History:  Procedure Laterality Date  . BREAST LUMPECTOMY Right 2014  . BREAST LUMPECTOMY WITH SENTINEL LYMPH NODE BIOPSY  04/09/2012   Procedure: BREAST LUMPECTOMY WITH SENTINEL LYMPH NODE BX;  Surgeon: Merrie Roof, MD;  Location: [facility];  Service: General;  Laterality: Right;  . BREAST SURGERY  1985   rt br bx  . COLONOSCOPY  03/2014   poylps  . CYSTOSCOPY N/A 01/22/2017   Procedure: CYSTOSCOPY;  Surgeon: Bobbye Charleston, MD;  Location: [facility];  Service: Gynecology;  Laterality: N/A;  . ROBOTIC ASSISTED TOTAL HYSTERECTOMY WITH BILATERAL SALPINGO OOPHERECTOMY Bilateral 01/22/2017   Procedure: ROBOTIC ASSISTED TOTAL HYSTERECTOMY WITH BILATERAL SALPINGECTOMY AND LEFT OOPHORECTOMY;  Surgeon: Bobbye Charleston, MD;  Location: [facility];  Service: Gynecology;  Laterality: Bilateral;     OB History    Gravida  1   Para  1   Term  1   Preterm      AB      Living  1     SAB      TAB      Ectopic      Multiple      Live Births  Home Medications    Prior to Admission medications   Medication Sig Start Date End Date Taking? Authorizing Provider  hydrocortisone cream 1 % Apply to affected area 2 times daily for 1 week 05/16/18   , [place] N, PA-C  loratadine (CLARITIN) 10 MG tablet Take 1 tablet (10 mg total) by mouth daily. 05/16/18   , [place] N, PA-C  potassium chloride SA (K-DUR,KLOR-CON) 20 MEQ tablet TAKE 1 TABLET BY MOUTH 3 TIMES DAILY 01/30/17   [name], MD  valsartan-hydrochlorothiazide (DIOVAN-HCT) 160-25 MG tablet Take 1 tablet by mouth daily. 05/05/18   [name], MD    Family History Family History  Problem Relation Age of Onset  . Lung cancer Father 7       smoker  . Cancer Father        lung  .  Hypertension Other   . Alcohol abuse Neg Hx   . Diabetes Neg Hx   . Early death Neg Hx   . Hearing loss Neg Hx   . Heart disease Neg Hx   . Hyperlipidemia Neg Hx   . Stroke Neg Hx   . Kidney disease Neg Hx   . Colon cancer Neg Hx   . Breast cancer Neg Hx     Social History Social History   Tobacco Use  . Smoking status: Never Smoker  . Smokeless tobacco: Never Used  Substance Use Topics  . Alcohol use: No  . Drug use: No     Allergies   Aspirin and Codeine   Review of Systems Review of Systems  Constitutional: Negative for fever.  HENT: Negative for facial swelling.   Skin: Positive for rash.     Physical Exam Updated Vital Signs BP (!) 129/99   Pulse 92   Temp 98.1 F (36.7 C) (Oral)   Resp 16   Ht 5\' 6"  (1.676 m)   Wt 72.6 kg   LMP 01/07/2016   SpO2 100%   BMI 25.82 kg/m   Physical Exam Vitals signs and nursing note reviewed.  Constitutional:      General: She is not in acute distress.    Appearance: Normal appearance. She is well-developed. She is not ill-appearing.  HENT:     Head: Normocephalic and atraumatic.  Eyes:     Conjunctiva/sclera: Conjunctivae normal.  Cardiovascular:     Rate and Rhythm: Normal rate.  Pulmonary:     Effort: Pulmonary effort is normal.  Skin:    Comments: There is an excoriated rash to the right wrist on the lateral and volar aspect, small amount on the dorsal aspect..  There are multiple clusters of clear fluid-filled vesicles, some are weeping.  There is some surrounding erythema and swelling, not circumferential.  There is no purulence.  Rash is nontender.  No petechia or purpura.  No bulla.  No desquamation.  See images below.  Neurological:     Mental Status: She is alert.  Psychiatric:        Mood and Affect: Mood normal.        Behavior: Behavior normal.          ED Treatments / Results  Labs (all labs ordered are listed, but only abnormal results are displayed) Labs Reviewed - No data to  display  EKG None  Radiology No results found.  Procedures Procedures (including critical care time)  Medications Ordered in ED Medications  predniSONE (DELTASONE) tablet 60 mg (60 mg Oral Given 05/16/18 1936)     Initial  Impression / Assessment and Plan / ED Course  I have reviewed the triage vital signs and the nursing notes.  Pertinent labs & imaging results that were available during my care of the patient were reviewed by me and considered in my medical decision making (see chart for details).     Rash consistent with irritant/allergic dermatitis.  Recent new fragrance well which she put on her close overlying the area of where the rash developed.  It is also a possibility that patient came into contact with some type of poison ivy/oak.  Patient denies any difficulty breathing or swallowing.  Rash is not painful and nontender.  Pt has a patent airway without stridor and is handling secretions without difficulty; no angioedema. No pustules,no draining sinus tracts, no superficial abscesses, no bullous impetigo,no desquamation, no target lesions with dusky purpura or a central bulla.  No concern for superimposed infection. No concern for SJS, TEN, TSS, tick borne illness, syphilis or other life-threatening condition. Will discharge home with topical steroids, pepcid and recommend Benadryl as needed for pruritis.  Discussed results, findings, treatment and follow up. Patient advised of return precautions. Patient verbalized understanding and agreed with plan.  Final Clinical Impressions(s) / ED Diagnoses   Final diagnoses:  Contact dermatitis, unspecified contact dermatitis type, unspecified trigger    ED Discharge Orders         Ordered    hydrocortisone cream 1 %     05/16/18 1931    loratadine (CLARITIN) 10 MG tablet  Daily     05/16/18 1931           , [place] N, PA-C 05/16/18 1945    Jola Schmidt, MD 05/17/18 [phone]

## 2018-05-18 ENCOUNTER — Encounter: Payer: Self-pay | Admitting: Internal Medicine

## 2018-05-18 ENCOUNTER — Ambulatory Visit (INDEPENDENT_AMBULATORY_CARE_PROVIDER_SITE_OTHER): Payer: BLUE CROSS/BLUE SHIELD | Admitting: Internal Medicine

## 2018-05-18 VITALS — BP 132/84 | HR 93 | Temp 97.8°F | Resp 16 | Ht 66.0 in | Wt 168.0 lb

## 2018-05-18 DIAGNOSIS — E785 Hyperlipidemia, unspecified: Secondary | ICD-10-CM

## 2018-05-18 DIAGNOSIS — R7303 Prediabetes: Secondary | ICD-10-CM

## 2018-05-18 DIAGNOSIS — Z0000 Encounter for general adult medical examination without abnormal findings: Secondary | ICD-10-CM

## 2018-05-18 DIAGNOSIS — I10 Essential (primary) hypertension: Secondary | ICD-10-CM | POA: Diagnosis not present

## 2018-05-18 DIAGNOSIS — B000 Eczema herpeticum: Secondary | ICD-10-CM

## 2018-05-18 MED ORDER — FLUOCINONIDE 0.05 % EX CREA: 1.0000 "application " | TOPICAL_CREAM | Freq: Two times a day (BID) | CUTANEOUS | 2 refills | Status: DC

## 2018-05-18 MED ORDER — LEVOCETIRIZINE DIHYDROCHLORIDE 5 MG PO TABS: 5.0000 mg | ORAL_TABLET | Freq: Every evening | ORAL | 1 refills | Status: DC

## 2018-05-18 MED FILL — FLUOCINONIDE 0.05% CREAM: 0.05 | 30 days supply | Qty: 60 | Fill #0

## 2018-05-18 MED FILL — LEVOCETIRIZINE 5 MG TABLET: 5 | 90 days supply | Qty: 90 | Fill #0

## 2018-05-18 NOTE — Progress Notes (Signed)
Subjective:  Patient ID: Brooke Wood, female    DOB: March 17, 1966  Age: 53 y.o. MRN: [id number]  CC: Rash; Hypertension; and Hyperlipidemia   HPI Brooke Wood presents for a 3-day history of itchy rash on the dorsum of her right wrist.  She does not recall being exposed anything.  She was seen in the ED 2 days ago.  She was treated with an oral dose of prednisone and it was recommended that she use hydrocortisone cream and Claritin.  She has not started either 1 and the symptoms have not improved.  Outpatient Medications Prior to Visit  Medication Sig Dispense Refill  . potassium chloride SA (K-DUR,KLOR-CON) 20 MEQ tablet TAKE 1 TABLET BY MOUTH 3 TIMES DAILY 90 tablet 3  . valsartan-hydrochlorothiazide (DIOVAN-HCT) 160-25 MG tablet Take 1 tablet by mouth daily. 30 tablet 0  . hydrocortisone cream 1 % Apply to affected area 2 times daily for 1 week 15 g 0  . loratadine (CLARITIN) 10 MG tablet Take 1 tablet (10 mg total) by mouth daily. (Patient not taking: Reported on 05/18/2018) 14 tablet 0   No facility-administered medications prior to visit.     ROS Review of Systems  Constitutional: Negative.  Negative for appetite change, diaphoresis, fatigue and unexpected weight change.  HENT: Negative.   Eyes: Negative.   Respiratory: Negative for cough, chest tightness, shortness of breath and wheezing.   Cardiovascular: Negative for chest pain, palpitations and leg swelling.  Gastrointestinal: Negative for abdominal pain, constipation, diarrhea, nausea and vomiting.  Endocrine: Negative.   Genitourinary: Negative.  Negative for difficulty urinating.  Musculoskeletal: Negative.  Negative for arthralgias and myalgias.  Skin: Positive for rash. Negative for color change.  Neurological: Negative.  Negative for dizziness and weakness.  Hematological: Does not bruise/bleed easily.  Psychiatric/Behavioral: Negative.     Objective:  BP 132/84 (BP Location: Left Arm, Patient Position: Sitting,  Cuff Size: Normal)   Pulse 93   Temp 97.8 F (36.6 C) (Oral)   Resp 16   Ht 5\' 6"  (1.676 m)   Wt 168 lb (76.2 kg)   LMP 01/07/2016   SpO2 98%   BMI 27.12 kg/m   BP Readings from Last 3 Encounters:  05/18/18 132/84  05/16/18 (!) 129/99  03/03/18 (!) 164/92    Wt Readings from Last 3 Encounters:  05/18/18 168 lb (76.2 kg)  05/16/18 160 lb (72.6 kg)  03/03/18 168 lb (76.2 kg)    Physical Exam Vitals signs reviewed.  Constitutional:      Appearance: She is not ill-appearing or diaphoretic.  HENT:     Right Ear: There is no impacted cerumen.     Nose: Nose normal. No congestion or rhinorrhea.     Mouth/Throat:     Mouth: Mucous membranes are moist.     Pharynx: Oropharynx is clear. No oropharyngeal exudate or posterior oropharyngeal erythema.  Eyes:     General: No scleral icterus.    Conjunctiva/sclera: Conjunctivae normal.  Neck:     Musculoskeletal: Normal range of motion and neck supple.  Cardiovascular:     Rate and Rhythm: Normal rate and regular rhythm.     Heart sounds: No murmur. No gallop.   Pulmonary:     Effort: Pulmonary effort is normal.     Breath sounds: No stridor. No wheezing or rales.  Abdominal:     General: Bowel sounds are normal.     Palpations: There is no mass.     Tenderness: There is no abdominal  tenderness.  Lymphadenopathy:     Cervical: No cervical adenopathy.  Skin:    Findings: Rash present.     Comments: Scattered on the dorsomedial surface of the right wrist is a confluent area of erythema and linear vesicles.  Across the dorsum of the hands, extending into the webspaces there is also severe xerosis.  There is no warmth, streaking, exudate, induration, or fluctuance.     Lab Results  Component Value Date   WBC 7.5 10/08/2017   HGB 13.3 10/08/2017   HCT 40.0 10/08/2017   PLT 219.0 10/08/2017   GLUCOSE 98 10/08/2017   CHOL 167 04/19/2015   TRIG 75.0 04/19/2015   HDL 55.50 04/19/2015   LDLCALC 96 04/19/2015   ALT 20  10/08/2017   AST 16 10/08/2017   NA 141 10/08/2017   K 3.9 10/08/2017   CL 104 10/08/2017   CREATININE 1.00 10/08/2017   BUN 16 10/08/2017   CO2 31 10/08/2017   TSH 1.15 10/08/2017   HGBA1C 6.1 11/24/2012    No results found.  Assessment & Plan:   Kamee was seen today for rash, hypertension and hyperlipidemia.  Diagnoses and all orders for this visit:  Essential hypertension, benign- Her blood pressure is adequately well controlled.  I will monitor her electrolytes and renal function. -     Comprehensive metabolic panel; Future  Hyperlipidemia with target LDL less than 130- I will check a fasting lipid panel and will treat if she has an elevated ASCVD risk score. -     Comprehensive metabolic panel; Future -     Lipid panel; Future  Prediabetes- I will recheck her A1c.  Will consider treating for diabetes if the A1c is greater than 7%. -     Hemoglobin A1c; Future -     Comprehensive metabolic panel; Future  Routine general medical examination at a health care facility  Eczema herpeticum - I have asked her to start an oral systemic antihistamine and mid potency topical steroid. -     levocetirizine (XYZAL) 5 MG tablet; Take 1 tablet (5 mg total) by mouth every evening. -     fluocinonide cream (LIDEX) 0.05 %; Apply 1 application topically 2 (two) times daily.   I have discontinued Hiram Comber hydrocortisone cream and loratadine. I am also having her start on levocetirizine and fluocinonide cream. Additionally, I am having her maintain her potassium chloride SA and valsartan-hydrochlorothiazide.  Meds ordered this encounter  Medications  . levocetirizine (XYZAL) 5 MG tablet    Sig: Take 1 tablet (5 mg total) by mouth every evening.    Dispense:  90 tablet    Refill:  1  . fluocinonide cream (LIDEX) 0.05 %    Sig: Apply 1 application topically 2 (two) times daily.    Dispense:  60 g    Refill:  2     Follow-up: Return in about 3 months (around  08/16/2018).  [name], MD

## 2018-05-18 NOTE — Patient Instructions (Signed)

## 2018-05-21 ENCOUNTER — Ambulatory Visit: Payer: BLUE CROSS/BLUE SHIELD | Admitting: Internal Medicine

## 2018-06-09 ENCOUNTER — Other Ambulatory Visit: Payer: Self-pay | Admitting: Internal Medicine

## 2018-06-09 ENCOUNTER — Ambulatory Visit: Payer: Self-pay

## 2018-06-09 DIAGNOSIS — I10 Essential (primary) hypertension: Secondary | ICD-10-CM

## 2018-06-09 MED ORDER — VALSARTAN-HYDROCHLOROTHIAZIDE 160-25 MG PO TABS: 1.0000 | ORAL_TABLET | Freq: Every day | ORAL | 0 refills | Status: DC

## 2018-06-09 MED FILL — VALSARTAN-HCTZ 160-25 MG TA: 160-25 | 90 days supply | Qty: 90 | Fill #0

## 2018-06-09 NOTE — Telephone Encounter (Signed)
Pt called requesting medication refill for valsartan- HCTZ

## 2018-06-09 NOTE — Telephone Encounter (Signed)
   Reason for Disposition . Mild localized rash  Answer Assessment - Initial Assessment Questions 1. TYPE of INSECT: "What type of insect was it?"      unsure 2. ONSET: "When did you get bitten?"      yesterday 3. LOCATION: "Where is the insect bite located?"      Neck , Shoulder ,breast left side and one on right side 4. REDNESS: "Is the area red or pink?" If so, ask "What size is area of redness?" (inches or cm). "When did the redness start?"    Quarter size 5. PAIN: "Is there any pain?" If so, ask: "How bad is it?"  (Scale 1-10; or mild, moderate, severe)     none 6. ITCHING: "Does it itch?" If so, ask: "How bad is the itch?"    - MILD: doesn't interfere with normal activities   - MODERATE - SEVERE: interferes with work, school, sleep, or other activities      severe 7. SWELLING: "How big is the swelling?" (inches, cm, or compare to coins)     Quarter knot to neck 8. OTHER SYMPTOMS: "Do you have any other symptoms?"  (e.g., difficulty breathing, hives)   no 9. PREGNANCY: "Is there any chance you are pregnant?" "When was your last menstrual period?"     N/A  Answer Assessment - Initial Assessment Questions 1. APPEARANCE of RASH: "Describe the rash."      4 spot 2. LOCATION: "Where is the rash located?"    Left neck 3. NUMBER: "How many spots are there?"   4 spots  4. SIZE: "How big are the spots?" (Inches, centimeters or compare to size of a coin)     quarter 5. ONSET: "When did the rash start?"      yesterday 6. ITCHING: "Does the rash itch?" If so, ask: "How bad is the itch?"  (Scale 1-10; or mild, moderate, severe)     severe 7. PAIN: "Does the rash hurt?" If so, ask: "How bad is the pain?"  (Scale 1-10; or mild, moderate, severe)     no 8. OTHER SYMPTOMS: "Do you have any other symptoms?" (e.g., fever)     none 9. PREGNANCY: "Is there any chance you are pregnant?" "When was your last menstrual period?"    N/A  Protocols used: RASH OR REDNESS - LOCALIZED-A-AH,  INSECT BITE-A-AH

## 2018-06-09 NOTE — Telephone Encounter (Signed)
Returned call to patient.  Pt states that she has had 4 spots that are itchy. One to her left neck left shoulder and left breast.  She also has on to her right shoulder area.  She describes them as a lump that is read and without drainage. The have no center.  They are about the size of a quarter.  There are no red streaks.  They are not painful but are itchy. She has used nothing over the counter to treat the itching. . Per protocol pt was read home care advice. Pt was insisting on an appointment with Dr Ronnald Ramp tomorrow. No appointments were available. Pt was unable to accept appointment today that was available. Pt refused other practitioners stating Dr Ronnald Ramp was the only doctor she would accept. Pt refused urgent care. Again pt was advised that she could treat theses areas at home per protocol. Care advice read to patient. Pt verbalized understanding and request that Dr Ronnald Ramp be aware of her request and symptom. Will route to office.

## 2018-06-24 ENCOUNTER — Telehealth: Payer: Self-pay | Admitting: Internal Medicine

## 2018-06-24 DIAGNOSIS — J301 Allergic rhinitis due to pollen: Secondary | ICD-10-CM

## 2018-06-24 NOTE — Telephone Encounter (Signed)
Copied from [place] [id number]. Topic: Quick Communication - Rx Refill/Question >> Jun 24, 2018  2:21 PM Alanda Slim E wrote: Medication: Pt wants to know if something for her allergies can be called in to the pharmacy. Pt is experiencing stuffy nose and ear clogged in the mornings / please advise and Pt said she has a person to give her ride today if it can be sent in asap   Has the patient contacted their pharmacy?   Preferred Pharmacy (with phone number or street name): [place], [place] - [place] [phone] (Phone) [phone] (Fax)    Agent: Please be advised that RX refills may take up to 3 business days. We ask that you follow-up with your pharmacy.

## 2018-06-25 ENCOUNTER — Other Ambulatory Visit: Payer: Self-pay | Admitting: Internal Medicine

## 2018-06-25 DIAGNOSIS — J301 Allergic rhinitis due to pollen: Secondary | ICD-10-CM

## 2018-06-25 MED ORDER — TRIAMCINOLONE ACETONIDE 55 MCG/ACT NA AERO: 2.0000 | INHALATION_SPRAY | Freq: Every day | NASAL | 3 refills | Status: DC

## 2018-06-25 MED ORDER — METHYLPREDNISOLONE 4 MG PO TBPK: ORAL_TABLET | ORAL | 0 refills | Status: DC

## 2018-06-25 MED ORDER — LEVOCETIRIZINE DIHYDROCHLORIDE 5 MG PO TABS: 5.0000 mg | ORAL_TABLET | Freq: Every evening | ORAL | 1 refills | Status: DC

## 2018-06-25 MED FILL — METHYLPREDNISOLONE 4 MG TBP: 4 | 6 days supply | Qty: 21 | Fill #0

## 2018-06-25 NOTE — Telephone Encounter (Signed)
Pt came into the office and wanted the rx sent to [facility]. Informed pt that she can call [facility] and request them to pull the rx that was sent to [facility]. Pt refused to do that stating that "she didn't know anything about that".   I informed pt I would resend to [facility]. Calling [facility] to cancel the earlier rx for dose pack.

## 2018-06-25 NOTE — Telephone Encounter (Signed)
Tried to call pt. Line rang and them message stated that vm is not set up at this time.

## 2018-06-25 NOTE — Telephone Encounter (Signed)
RX's sent to [place]

## 2018-06-25 NOTE — Addendum Note (Signed)
Addended by: Karle Barr on: 06/25/2018 02:59 PM   Modules accepted: Orders

## 2018-07-01 ENCOUNTER — Telehealth: Payer: Self-pay

## 2018-07-01 NOTE — Telephone Encounter (Signed)
Copied from [place] [id number]. Topic: General - Other >> Jul 01, 2018  7:52 AM Keene Breath wrote: Reason for CRM: Patient called to inform the doctor that the medicine he gave for her ear is not helping.  Patient said she cannot hear out of the ear and believes there is something stuck in her ear.  She stated it is still clogged and feels numb.  Please advise and call patient back to let her know what she should do.  CB# [phone]  Routing to dr Ronnald Ramp, please advise, thanks

## 2018-07-01 NOTE — Telephone Encounter (Signed)
Copied from [place] [id number]. Topic: General - Other >> Jul 01, 2018  7:52 AM Keene Breath wrote: Reason for CRM: Patient called to inform the doctor that the medicine he gave for her ear is not helping.  Patient said she cannot hear out of the ear and believes there is something stuck in her ear.  She stated it is still clogged and feels numb.  Please advise and call patient back to let her know what she should do.  CB# [phone] >> Jul 01, 2018 10:45 AM Marin Olp L wrote: Patient would like appt today for left ear pain and heaviness. Wants to come into [place] and rides [place] bus.  Routing to dr Ronnald Ramp, please advise, thanks

## 2018-07-02 ENCOUNTER — Encounter: Payer: Self-pay | Admitting: Internal Medicine

## 2018-07-02 ENCOUNTER — Other Ambulatory Visit: Payer: Self-pay

## 2018-07-02 ENCOUNTER — Ambulatory Visit (INDEPENDENT_AMBULATORY_CARE_PROVIDER_SITE_OTHER): Payer: BLUE CROSS/BLUE SHIELD | Admitting: Internal Medicine

## 2018-07-02 ENCOUNTER — Other Ambulatory Visit (INDEPENDENT_AMBULATORY_CARE_PROVIDER_SITE_OTHER): Payer: BLUE CROSS/BLUE SHIELD

## 2018-07-02 VITALS — BP 128/80 | HR 91 | Temp 98.1°F | Resp 16 | Ht 66.0 in | Wt 163.5 lb

## 2018-07-02 DIAGNOSIS — R0609 Other forms of dyspnea: Secondary | ICD-10-CM

## 2018-07-02 DIAGNOSIS — R0600 Dyspnea, unspecified: Secondary | ICD-10-CM

## 2018-07-02 DIAGNOSIS — R7303 Prediabetes: Secondary | ICD-10-CM

## 2018-07-02 DIAGNOSIS — E118 Type 2 diabetes mellitus with unspecified complications: Secondary | ICD-10-CM | POA: Diagnosis not present

## 2018-07-02 DIAGNOSIS — H6122 Impacted cerumen, left ear: Secondary | ICD-10-CM | POA: Diagnosis not present

## 2018-07-02 DIAGNOSIS — I10 Essential (primary) hypertension: Secondary | ICD-10-CM | POA: Diagnosis not present

## 2018-07-02 DIAGNOSIS — Z0000 Encounter for general adult medical examination without abnormal findings: Secondary | ICD-10-CM

## 2018-07-02 DIAGNOSIS — R9431 Abnormal electrocardiogram [ECG] [EKG]: Secondary | ICD-10-CM

## 2018-07-02 DIAGNOSIS — E785 Hyperlipidemia, unspecified: Secondary | ICD-10-CM

## 2018-07-02 LAB — CBC WITH DIFFERENTIAL/PLATELET
Basophils Absolute: 0.2 10*3/uL — ABNORMAL HIGH (ref 0.0–0.1)
Basophils Relative: 2.3 % (ref 0.0–3.0)
Eosinophils Absolute: 0.1 10*3/uL (ref 0.0–0.7)
Eosinophils Relative: 0.8 % (ref 0.0–5.0)
HCT: 43.9 % (ref 36.0–46.0)
Hemoglobin: 14.7 g/dL (ref 12.0–15.0)
Lymphocytes Relative: 41.7 % (ref 12.0–46.0)
Lymphs Abs: 3.3 10*3/uL (ref 0.7–4.0)
MCHC: 33.6 g/dL (ref 30.0–36.0)
MCV: 86.2 fl (ref 78.0–100.0)
Monocytes Absolute: 0.7 10*3/uL (ref 0.1–1.0)
Monocytes Relative: 8.6 % (ref 3.0–12.0)
Neutro Abs: 3.7 10*3/uL (ref 1.4–7.7)
Neutrophils Relative %: 46.6 % (ref 43.0–77.0)
Platelets: 233 10*3/uL (ref 150.0–400.0)
RBC: 5.09 Mil/uL (ref 3.87–5.11)
RDW: 14.1 % (ref 11.5–15.5)
WBC: 7.8 10*3/uL (ref 4.0–10.5)

## 2018-07-02 LAB — URINALYSIS, ROUTINE W REFLEX MICROSCOPIC
Bilirubin Urine: NEGATIVE
Hgb urine dipstick: NEGATIVE
Ketones, ur: NEGATIVE
Leukocytes,Ua: NEGATIVE
Nitrite: NEGATIVE
Specific Gravity, Urine: 1.02 (ref 1.000–1.030)
Total Protein, Urine: NEGATIVE
Urine Glucose: NEGATIVE
Urobilinogen, UA: 0.2 (ref 0.0–1.0)
pH: 7.5 (ref 5.0–8.0)

## 2018-07-02 LAB — COMPREHENSIVE METABOLIC PANEL
ALT: 19 U/L (ref 0–35)
AST: 11 U/L (ref 0–37)
Albumin: 4.4 g/dL (ref 3.5–5.2)
Alkaline Phosphatase: 163 U/L — ABNORMAL HIGH (ref 39–117)
BUN: 17 mg/dL (ref 6–23)
CO2: 31 mEq/L (ref 19–32)
Calcium: 10 mg/dL (ref 8.4–10.5)
Chloride: 99 mEq/L (ref 96–112)
Creatinine, Ser: 0.69 mg/dL (ref 0.40–1.20)
GFR: 106.84 mL/min (ref >60.00)
Glucose, Bld: 115 mg/dL — ABNORMAL HIGH (ref 70–99)
Potassium: 3.8 mEq/L (ref 3.5–5.1)
Sodium: 138 mEq/L (ref 135–145)
Total Bilirubin: 0.4 mg/dL (ref 0.2–1.2)
Total Protein: 7.2 g/dL (ref 6.0–8.3)

## 2018-07-02 LAB — BRAIN NATRIURETIC PEPTIDE: Pro B Natriuretic peptide (BNP): 17 pg/mL (ref 0.0–100.0)

## 2018-07-02 LAB — LIPID PANEL
Cholesterol: 165 mg/dL (ref 0–200)
HDL: 55.2 mg/dL (ref >39.00)
LDL Cholesterol: 82 mg/dL (ref 0–99)
NonHDL: 109.5
Total CHOL/HDL Ratio: 3
Triglycerides: 136 mg/dL (ref 0.0–149.0)
VLDL: 27.2 mg/dL (ref 0.0–40.0)

## 2018-07-02 LAB — TROPONIN I: TNIDX: 0 ug/l (ref 0.00–0.06)

## 2018-07-02 LAB — TSH: TSH: 1.4 u[IU]/mL (ref 0.35–4.50)

## 2018-07-02 LAB — HEMOGLOBIN A1C: Hgb A1c MFr Bld: 6.8 % — ABNORMAL HIGH (ref 4.6–6.5)

## 2018-07-02 MED ORDER — ASPIRIN EC 81 MG PO TBEC: 81.0000 mg | DELAYED_RELEASE_TABLET | Freq: Every day | ORAL | 1 refills | Status: DC

## 2018-07-02 MED ORDER — ROSUVASTATIN CALCIUM 5 MG PO TABS: 5.0000 mg | ORAL_TABLET | Freq: Every day | ORAL | 1 refills | Status: DC

## 2018-07-02 MED FILL — ROSUVASTATIN CALCIUM 5 MG T: 5 | 90 days supply | Qty: 90 | Fill #0

## 2018-07-02 MED FILL — ASPIRIN ADULT LOW STRENGTH: 81 | 90 days supply | Qty: 90 | Fill #0

## 2018-07-02 NOTE — Progress Notes (Signed)
Subjective:  Patient ID: Brooke Wood, female    DOB: 1965-05-18  Age: 53 y.o. MRN: [id number]  CC: Annual Exam; Hypertension; and Diabetes   HPI [name] presents for a CPX.  She complains of persistent episodes of DOE when climbing a steep hill.  In the last year she has had a myocardial perfusion imaging which was normal.  She denies CP, diaphoresis, dizziness, lightheadedness, palpitations, edema, or near syncope.  She complains of a several week history of discomfort in her left ear and decreased level of hearing on the left side.   Outpatient Medications Prior to Visit  Medication Sig Dispense Refill  . potassium chloride SA (K-DUR,KLOR-CON) 20 MEQ tablet TAKE 1 TABLET BY MOUTH 3 TIMES DAILY 90 tablet 3  . valsartan-hydrochlorothiazide (DIOVAN-HCT) 160-25 MG tablet Take 1 tablet by mouth daily. 90 tablet 0  . methylPREDNISolone (MEDROL DOSEPAK) 4 MG TBPK tablet Take as directed 21 tablet 0  . fluocinonide cream (LIDEX) 4.19 % Apply 1 application topically 2 (two) times daily. (Patient not taking: Reported on 07/02/2018) 60 g 2  . levocetirizine (XYZAL) 5 MG tablet Take 1 tablet (5 mg total) by mouth every evening. (Patient not taking: Reported on 07/02/2018) 90 tablet 1  . triamcinolone (NASACORT) 55 MCG/ACT AERO nasal inhaler Place 2 sprays into the nose daily. (Patient not taking: Reported on 07/02/2018) 32.4 mL 3   No facility-administered medications prior to visit.     ROS Review of Systems  Constitutional: Negative.  Negative for diaphoresis, fatigue and unexpected weight change.  HENT: Positive for ear pain and hearing loss. Negative for sinus pressure, sore throat, trouble swallowing and voice change.   Respiratory: Positive for shortness of breath. Negative for cough, chest tightness, wheezing and stridor.   Cardiovascular: Negative for chest pain, palpitations and leg swelling.  Gastrointestinal: Negative for abdominal pain, diarrhea, nausea and vomiting.   Endocrine: Negative for polydipsia, polyphagia and polyuria.  Genitourinary: Negative.  Negative for decreased urine volume, difficulty urinating, dysuria, hematuria and urgency.  Musculoskeletal: Negative.  Negative for arthralgias.  Skin: Negative.  Negative for color change.  Neurological: Negative.  Negative for dizziness, weakness, light-headedness and headaches.  Hematological: Negative for adenopathy. Does not bruise/bleed easily.  Psychiatric/Behavioral: Negative.     Objective:  BP 128/80 (BP Location: Left Arm, Patient Position: Sitting, Cuff Size: Normal)   Pulse 91   Temp 98.1 F (36.7 C) (Oral)   Resp 16   Ht 5\' 6"  (1.676 m)   Wt 163 lb 8 oz (74.2 kg)   LMP 01/07/2016   SpO2 94%   BMI 26.39 kg/m   BP Readings from Last 3 Encounters:  07/02/18 128/80  05/18/18 132/84  05/16/18 (!) 129/99    Wt Readings from Last 3 Encounters:  07/02/18 163 lb 8 oz (74.2 kg)  05/18/18 168 lb (76.2 kg)  05/16/18 160 lb (72.6 kg)    Physical Exam Vitals signs reviewed.  Constitutional:      General: She is not in acute distress.    Appearance: She is not ill-appearing, toxic-appearing or diaphoretic.  HENT:     Right Ear: Hearing, tympanic membrane, ear canal and external ear normal. There is no impacted cerumen.     Left Ear: Tympanic membrane normal. Decreased hearing noted. There is impacted cerumen.     Ears:     Comments: I put Colace in her left ear and then I irrigated it with water.  I used to pick to remove the cerumen.  The cerumen was successfully removed.  Afterwards her symptoms have resolved and her hearing is normal.  Examination of the TM and EAC is normal now.    Mouth/Throat:     Mouth: Mucous membranes are moist.     Pharynx: No oropharyngeal exudate or posterior oropharyngeal erythema.  Eyes:     General: No scleral icterus.    Conjunctiva/sclera: Conjunctivae normal.  Neck:     Musculoskeletal: Normal range of motion and neck supple. No neck rigidity  or muscular tenderness.  Cardiovascular:     Rate and Rhythm: Normal rate and regular rhythm.     Pulses: Normal pulses.     Heart sounds: No murmur. No gallop.   Pulmonary:     Effort: Pulmonary effort is normal. No respiratory distress.     Breath sounds: No stridor. No wheezing, rhonchi or rales.  Abdominal:     General: Abdomen is flat. Bowel sounds are normal.     Palpations: There is no hepatomegaly, splenomegaly or mass.     Tenderness: There is no abdominal tenderness. There is no guarding.  Musculoskeletal: Normal range of motion.        General: No swelling.     Right lower leg: No edema.     Left lower leg: No edema.  Lymphadenopathy:     Cervical: No cervical adenopathy.  Skin:    General: Skin is warm and dry.     Findings: No erythema.  Neurological:     General: No focal deficit present.     Mental Status: She is oriented to person, place, and time. Mental status is at baseline.     Lab Results  Component Value Date   WBC 7.8 07/02/2018   HGB 14.7 07/02/2018   HCT 43.9 07/02/2018   PLT 233.0 07/02/2018   GLUCOSE 115 (H) 07/02/2018   CHOL 165 07/02/2018   TRIG 136.0 07/02/2018   HDL 55.20 07/02/2018   LDLCALC 82 07/02/2018   ALT 19 07/02/2018   AST 11 07/02/2018   NA 138 07/02/2018   K 3.8 07/02/2018   CL 99 07/02/2018   CREATININE 0.69 07/02/2018   BUN 17 07/02/2018   CO2 31 07/02/2018   TSH 1.40 07/02/2018   HGBA1C 6.8 (H) 07/02/2018    No results found.  Assessment & Plan:   Asami was seen today for annual exam, hypertension and diabetes.  Diagnoses and all orders for this visit:  Hearing loss due to cerumen impaction, left- Her symptoms have resolved and the examination of the EAC is normal now that the cerumen has been removed.  DOE (dyspnea on exertion)- Her BNP and troponin are normal.  I do not think her shortness of breath is related to cardiovascular disease or fluid overload.  I think she has deconditioning.  Her other labs are  negative for secondary causes for DOE.  I have asked her to exercise more to improve her endurance.. -     CBC with Differential/Platelet; Future -     Brain natriuretic peptide; Future -     Comprehensive metabolic panel; Future -     Troponin I -; Future  Essential hypertension, benign- Her blood pressure is adequately well controlled.  Electrolytes and renal function are normal. -     Comprehensive metabolic panel; Future -     TSH; Future -     Urinalysis, Routine w reflex microscopic; Future  Routine general medical examination at a health care facility- Exam completed, labs reviewed, vaccines reviewed and updated,  Pap/mammogram/colon cancer screening are all up-to-date, patient education material was given. -     Lipid panel; Future -     HIV Antibody (routine testing w rflx); Future  Abnormal electrocardiogram (ECG) (EKG)  Prediabetes -     Hemoglobin A1c; Future  Type II diabetes mellitus with manifestations ([place])- Her A1c is up to 6.8%.  I informed her that she has DM 2.  I do not think her blood sugars are high enough to require medical therapy at this time.  She was encouraged to improve her lifestyle modifications. -     Ambulatory referral to Ophthalmology -     HM DIABETES FOOT EXAM  Hyperlipidemia with target LDL less than 130- I have asked her to start taking a statin and a baby aspirin every day for CV risk reduction. -     rosuvastatin (CRESTOR) 5 MG tablet; Take 1 tablet (5 mg total) by mouth daily. -     aspirin EC 81 MG tablet; Take 1 tablet (81 mg total) by mouth daily.   I have discontinued Helene Kelp I. Fifer's methylPREDNISolone. I am also having her start on rosuvastatin and aspirin EC. Additionally, I am having her maintain her potassium chloride SA, fluocinonide cream, valsartan-hydrochlorothiazide, levocetirizine, and triamcinolone.  Meds ordered this encounter  Medications  . rosuvastatin (CRESTOR) 5 MG tablet    Sig: Take 1 tablet (5 mg total) by mouth  daily.    Dispense:  90 tablet    Refill:  1  . aspirin EC 81 MG tablet    Sig: Take 1 tablet (81 mg total) by mouth daily.    Dispense:  90 tablet    Refill:  1     Follow-up: Return in about 3 months (around 10/01/2018).  [name], MD

## 2018-07-02 NOTE — Patient Instructions (Signed)
Preventive Care 40-64 Years, Female Preventive care refers to lifestyle choices and visits with your health care provider that can promote health and wellness. What does preventive care include?   A yearly physical exam. This is also called an annual well check.  Dental exams once or twice a year.  Routine eye exams. Ask your health care provider how often you should have your eyes checked.  Personal lifestyle choices, including: ? Daily care of your teeth and gums. ? Regular physical activity. ? Eating a healthy diet. ? Avoiding tobacco and drug use. ? Limiting alcohol use. ? Practicing safe sex. ? Taking low-dose aspirin daily starting at age 50. ? Taking vitamin and mineral supplements as recommended by your health care provider. What happens during an annual well check? The services and screenings done by your health care provider during your annual well check will depend on your age, overall health, lifestyle risk factors, and family history of disease. Counseling Your health care provider may ask you questions about your:  Alcohol use.  Tobacco use.  Drug use.  Emotional well-being.  Home and relationship well-being.  Sexual activity.  Eating habits.  Work and work environment.  Method of birth control.  Menstrual cycle.  Pregnancy history. Screening You may have the following tests or measurements:  Height, weight, and BMI.  Blood pressure.  Lipid and cholesterol levels. These may be checked every 5 years, or more frequently if you are over 50 years old.  Skin check.  Lung cancer screening. You may have this screening every year starting at age 55 if you have a 30-pack-year history of smoking and currently smoke or have quit within the past 15 years.  Colorectal cancer screening. All adults should have this screening starting at age 50 and continuing until age 75. Your health care provider may recommend screening at age 45. You will have tests every  1-10 years, depending on your results and the type of screening test. People at increased risk should start screening at an earlier age. Screening tests may include: ? Guaiac-based fecal occult blood testing. ? Fecal immunochemical test (FIT). ? Stool DNA test. ? Virtual colonoscopy. ? Sigmoidoscopy. During this test, a flexible tube with a tiny camera (sigmoidoscope) is used to examine your rectum and lower colon. The sigmoidoscope is inserted through your anus into your rectum and lower colon. ? Colonoscopy. During this test, a long, thin, flexible tube with a tiny camera (colonoscope) is used to examine your entire colon and rectum.  Hepatitis C blood test.  Hepatitis B blood test.  Sexually transmitted disease (STD) testing.  Diabetes screening. This is done by checking your blood sugar (glucose) after you have not eaten for a while (fasting). You may have this done every 1-3 years.  Mammogram. This may be done every 1-2 years. Talk to your health care provider about when you should start having regular mammograms. This may depend on whether you have a family history of breast cancer.  BRCA-related cancer screening. This may be done if you have a family history of breast, ovarian, tubal, or peritoneal cancers.  Pelvic exam and Pap test. This may be done every 3 years starting at age 21. Starting at age 30, this may be done every 5 years if you have a Pap test in combination with an HPV test.  Bone density scan. This is done to screen for osteoporosis. You may have this scan if you are at high risk for osteoporosis. Discuss your test results, treatment options,   and if necessary, the need for more tests with your health care provider. Vaccines Your health care provider may recommend certain vaccines, such as:  Influenza vaccine. This is recommended every year.  Tetanus, diphtheria, and acellular pertussis (Tdap, Td) vaccine. You may need a Td booster every 10 years.  Varicella  vaccine. You may need this if you have not been vaccinated.  Zoster vaccine. You may need this after age 38.  Measles, mumps, and rubella (MMR) vaccine. You may need at least one dose of MMR if you were born in 1957 or later. You may also need a second dose.  Pneumococcal 13-valent conjugate (PCV13) vaccine. You may need this if you have certain conditions and were not previously vaccinated.  Pneumococcal polysaccharide (PPSV23) vaccine. You may need one or two doses if you smoke cigarettes or if you have certain conditions.  Meningococcal vaccine. You may need this if you have certain conditions.  Hepatitis A vaccine. You may need this if you have certain conditions or if you travel or work in places where you may be exposed to hepatitis A.  Hepatitis B vaccine. You may need this if you have certain conditions or if you travel or work in places where you may be exposed to hepatitis B.  Haemophilus influenzae type b (Hib) vaccine. You may need this if you have certain conditions. Talk to your health care provider about which screenings and vaccines you need and how often you need them. This information is not intended to replace advice given to you by your health care provider. Make sure you discuss any questions you have with your health care provider. Document Released: 04/14/2015 Document Revised: 05/08/2017 Document Reviewed: 01/17/2015 Elsevier Interactive Patient Education  2019 [employer].

## 2018-07-03 ENCOUNTER — Telehealth: Payer: Self-pay | Admitting: *Deleted

## 2018-07-03 ENCOUNTER — Other Ambulatory Visit: Payer: Self-pay

## 2018-07-03 NOTE — Telephone Encounter (Signed)
Pt came in to the office and requested to speak to nurse " I want to show you something "  Pt taken to exam room - where upon she stated she had a rash " from something I ate and now the rash is gone but I got this area on my breast that is discolored "  Michie showed nurse left breast - with small penny sized area of mildly discolored skin at the 9 oclock position.  Skin is intact, smooth to touch with no lump or puckering.  This RN informed pt - likely secondary to rash - to use neosporin on site and monitor- she is to call if area does not improve.  Pt also asked about " I work at [employer] and am around people - do I need to tell them I can't do that since I had cancer?"  This RN informed pt she is 5 plus years out from having a cancer, completed medications and is not under active treatment . Advised pt to follow CDC guidelines regarding being in public places as well as frequent handwashing.  No further needs at this time.

## 2018-08-11 ENCOUNTER — Ambulatory Visit (INDEPENDENT_AMBULATORY_CARE_PROVIDER_SITE_OTHER)
Admission: RE | Admit: 2018-08-11 | Discharge: 2018-08-11 | Disposition: A | Discharge status: Home / Self Care | Payer: BLUE CROSS/BLUE SHIELD | Source: Ambulatory Visit | Attending: Internal Medicine | Admitting: Internal Medicine

## 2018-08-11 ENCOUNTER — Encounter: Payer: Self-pay | Admitting: Internal Medicine

## 2018-08-11 ENCOUNTER — Other Ambulatory Visit: Payer: Self-pay

## 2018-08-11 ENCOUNTER — Ambulatory Visit (INDEPENDENT_AMBULATORY_CARE_PROVIDER_SITE_OTHER): Payer: BLUE CROSS/BLUE SHIELD | Admitting: Internal Medicine

## 2018-08-11 VITALS — BP 124/74 | HR 88 | Temp 97.7°F | Resp 16 | Ht 66.0 in | Wt 162.2 lb

## 2018-08-11 DIAGNOSIS — L309 Dermatitis, unspecified: Secondary | ICD-10-CM | POA: Diagnosis not present

## 2018-08-11 DIAGNOSIS — G8929 Other chronic pain: Secondary | ICD-10-CM

## 2018-08-11 DIAGNOSIS — B000 Eczema herpeticum: Secondary | ICD-10-CM

## 2018-08-11 DIAGNOSIS — E118 Type 2 diabetes mellitus with unspecified complications: Secondary | ICD-10-CM | POA: Diagnosis not present

## 2018-08-11 DIAGNOSIS — M25552 Pain in left hip: Secondary | ICD-10-CM | POA: Diagnosis not present

## 2018-08-11 DIAGNOSIS — M7062 Trochanteric bursitis, left hip: Secondary | ICD-10-CM

## 2018-08-11 DIAGNOSIS — I10 Essential (primary) hypertension: Secondary | ICD-10-CM | POA: Diagnosis not present

## 2018-08-11 MED ORDER — MELOXICAM 15 MG PO TABS: 15.0000 mg | ORAL_TABLET | Freq: Every day | ORAL | 0 refills | Status: DC

## 2018-08-11 MED ORDER — FLUOCINONIDE 0.05 % EX CREA: 1.0000 "application " | TOPICAL_CREAM | Freq: Two times a day (BID) | CUTANEOUS | 2 refills | Status: DC

## 2018-08-11 MED FILL — FLUOCINONIDE 0.05% CREAM: 0.05 | 15 days supply | Qty: 60 | Fill #0

## 2018-08-11 MED FILL — MELOXICAM 15 MG TABLET: 15 | 90 days supply | Qty: 90 | Fill #0

## 2018-08-11 NOTE — Patient Instructions (Signed)
Trochanteric Bursitis  Trochanteric bursitis is a condition that causes hip pain. Trochanteric bursitis happens when fluid-filled sacs (bursae) in the hip get irritated. Normally these sacs absorb shock and help strong bands of tissue (tendons) in your hip glide smoothly over each other and over your hip bones.  What are the causes?  This condition results from increased friction between the hip bones and the tendons that go over them. This condition can happen if you:  · Have weak hips.  · Use your hip muscles too much (overuse).  · Get hit in the hip.  What increases the risk?  This condition is more likely to develop in:  · Women.  · Adults who are middle-aged or older.  · People with arthritis or a spinal condition.  · People with weak buttocks muscles (gluteal muscles).  · People who have one leg that is shorter than the other.  · People who participate in certain kinds of athletic activities, such as:  ? Running sports, especially long-distance running.  ? Contact sports, like football or martial arts.  ? Sports in which falls may occur, like skiing.  What are the signs or symptoms?  The main symptom of this condition is pain and tenderness over the point of your hip. The pain may be:  · Sharp and intense.  · Dull and achy.  · Felt on the outside of your thigh.  It may increase when you:  · Lie on your side.  · Walk or run.  · Go up on stairs.  · Sit.  · Stand up after sitting.  · Stand for long periods of time.  How is this diagnosed?  This condition may be diagnosed based on:  · Your symptoms.  · Your medical history.  · A physical exam.  · Imaging tests, such as:  ? X-rays to check your bones.  ? An MRI or ultrasound to check your tendons and muscles.  During your physical exam, your health care provider will check the movement and strength of your hip. He or she may press on the point of your hip to check for pain.  How is this treated?  This condition may be treated by:  · Resting.  · Reducing your  activity.  · Avoiding activities that cause pain.  · Using crutches, a cane, or a walker to decrease the strain on your hip.  · Taking medicine to help with swelling.  · Having medicine injected into the bursae to help with swelling.  · Using ice, heat, and massage therapy for pain relief.  · Physical therapy exercises for strength and flexibility.  · Surgery (rare).  Follow these instructions at home:  Activity  · Rest.  · Avoid activities that cause pain.  · Return to your normal activities as told by your health care provider. Ask your health care provider what activities are safe for you.  Managing pain, stiffness, and swelling  · Take over-the-counter and prescription medicines only as told by your health care provider.  · If directed, apply heat to the injured area as told by your health care provider.  ? Place a towel between your skin and the heat source.  ? Leave the heat on for 20-30 minutes.  ? Remove the heat if your skin turns bright red. This is especially important if you are unable to feel pain, heat, or cold. You may have a greater risk of getting burned.  · If directed, apply ice to the injured area:  ?   Put ice in a plastic bag.  ? Place a towel between your skin and the bag.  ? Leave the ice on for 20 minutes, 2-3 times a day.  General instructions  · If the affected leg is one that you use for driving, ask your health care provider when it is safe to drive.  · Use crutches, a cane, or a walker as told by your health care provider.  · If one of your legs is shorter than the other, get fitted for a shoe insert.  · Lose weight if you are overweight.  How is this prevented?  · Wear supportive footwear that is appropriate for your sport.  · If you have hip pain, start any new exercise or sport slowly.  · Maintain physical fitness, including:  ? Strength.  ? Flexibility.  Contact a health care provider if:  · Your pain does not improve with 2-4 weeks.  Get help right away if:  · You develop severe  pain.  · You have a fever.  · You develop increased redness over your hip.  · You have a change in your bowel function or bladder function.  · You cannot control the muscles in your feet.  This information is not intended to replace advice given to you by your health care provider. Make sure you discuss any questions you have with your health care provider.  Document Released: 04/25/2004 Document Revised: 11/22/2015 Document Reviewed: 03/03/2015  Elsevier Interactive Patient Education © 2019 [employer]

## 2018-08-11 NOTE — Progress Notes (Signed)
Subjective:  Patient ID: Brooke Wood, female    DOB: [birth year]  Age: 53 y.o. MRN: [id number]  CC: Diabetes; Hypertension; and Rash   HPI NAVIE LAMOREAUX presents for f/up -  1.  She complains of a several week history of itchy rash on the dorsum of both hands.  She claims that she is exposed to chemicals and gloves at work that initiate this.  I saw her about 3 months ago and treated her for hand eczema with a topical steroid.  She tells me she is not currently using topical steroid.  2.  She complains of a several year history of recurrent episodes of left hip pain.  She tells me she was previously treated by an orthopedic surgeon and got a steroid on the side of her hip.  It sounds like she was treated for trochanteric bursitis.  She denies any recent trauma or injury.  Outpatient Medications Prior to Visit  Medication Sig Dispense Refill  . aspirin EC 81 MG tablet Take 1 tablet (81 mg total) by mouth daily. 90 tablet 1  . levocetirizine (XYZAL) 5 MG tablet Take 1 tablet (5 mg total) by mouth every evening. 90 tablet 1  . potassium chloride SA (K-DUR,KLOR-CON) 20 MEQ tablet TAKE 1 TABLET BY MOUTH 3 TIMES DAILY 90 tablet 3  . rosuvastatin (CRESTOR) 5 MG tablet Take 1 tablet (5 mg total) by mouth daily. 90 tablet 1  . triamcinolone (NASACORT) 55 MCG/ACT AERO nasal inhaler Place 2 sprays into the nose daily. 32.4 mL 3  . valsartan-hydrochlorothiazide (DIOVAN-HCT) 160-25 MG tablet Take 1 tablet by mouth daily. 90 tablet 0  . fluocinonide cream (LIDEX) 1.30 % Apply 1 application topically 2 (two) times daily. 60 g 2   No facility-administered medications prior to visit.     ROS Review of Systems  Constitutional: Negative.   HENT: Negative.   Eyes: Negative.   Respiratory: Negative for shortness of breath and wheezing.   Cardiovascular: Negative for chest pain, palpitations and leg swelling.  Gastrointestinal: Negative for abdominal pain, constipation, diarrhea, nausea and vomiting.   Endocrine: Negative.   Genitourinary: Negative.   Musculoskeletal: Positive for arthralgias. Negative for back pain.  Skin: Positive for rash. Negative for color change.  Neurological: Negative.  Negative for dizziness, weakness and headaches.  Hematological: Negative for adenopathy. Does not bruise/bleed easily.  Psychiatric/Behavioral: Negative.     Objective:  BP 124/74 (BP Location: Left Arm, Patient Position: Sitting, Cuff Size: Normal)   Pulse 88   Temp 97.7 F (36.5 C) (Oral)   Resp 16   Ht 5\' 6"  (1.676 m)   Wt 162 lb 4 oz (73.6 kg)   LMP 01/07/2016   SpO2 99%   BMI 26.19 kg/m   BP Readings from Last 3 Encounters:  08/11/18 124/74  07/02/18 128/80  05/18/18 132/84    Wt Readings from Last 3 Encounters:  08/11/18 162 lb 4 oz (73.6 kg)  07/02/18 163 lb 8 oz (74.2 kg)  05/18/18 168 lb (76.2 kg)    Physical Exam Vitals signs reviewed.  Constitutional:      Appearance: She is not ill-appearing or diaphoretic.  HENT:     Nose: No congestion.     Mouth/Throat:     Mouth: Mucous membranes are moist.     Pharynx: No oropharyngeal exudate or posterior oropharyngeal erythema.  Eyes:     General: No scleral icterus.    Conjunctiva/sclera: Conjunctivae normal.  Neck:     Musculoskeletal: Normal range  of motion and neck supple.  Cardiovascular:     Rate and Rhythm: Normal rate and regular rhythm.     Heart sounds: No murmur. No gallop.   Pulmonary:     Effort: Pulmonary effort is normal. No respiratory distress.     Breath sounds: No stridor. No wheezing or rales.  Abdominal:     General: Bowel sounds are normal.     Tenderness: There is no abdominal tenderness.  Musculoskeletal:     Left hip: She exhibits tenderness (there is TTP over the GT) and bony tenderness. She exhibits normal range of motion, normal strength, no swelling, no crepitus and no deformity.  Lymphadenopathy:     Cervical: No cervical adenopathy.  Skin:    General: Skin is warm.     Findings:  Rash present. No erythema.     Comments: On the dorsum of both hands there are large swaths of eczema with scale, lichenification, and xerosis.  There is no erythema, pustules, streaking, tenderness, or induration.  Neurological:     General: No focal deficit present.  Psychiatric:        Mood and Affect: Mood normal.        Behavior: Behavior normal.     Lab Results  Component Value Date   WBC 7.8 07/02/2018   HGB 14.7 07/02/2018   HCT 43.9 07/02/2018   PLT 233.0 07/02/2018   GLUCOSE 115 (H) 07/02/2018   CHOL 165 07/02/2018   TRIG 136.0 07/02/2018   HDL 55.20 07/02/2018   LDLCALC 82 07/02/2018   ALT 19 07/02/2018   AST 11 07/02/2018   NA 138 07/02/2018   K 3.8 07/02/2018   CL 99 07/02/2018   CREATININE 0.69 07/02/2018   BUN 17 07/02/2018   CO2 31 07/02/2018   TSH 1.40 07/02/2018   HGBA1C 6.8 (H) 07/02/2018    No results found.  Assessment & Plan:   Damyiah was seen today for diabetes, hypertension and rash.  Diagnoses and all orders for this visit:  Type II diabetes mellitus with manifestations ([place])- Her blood sugars are adequately well controlled.  Essential hypertension, benign- Her blood pressure is adequately well controlled.  Acute hand eczema -     fluocinonide cream (LIDEX) 0.05 %; Apply 1 application topically 2 (two) times daily.  Chronic left hip pain -     Ambulatory referral to Orthopedic Surgery -     DG HIP UNILAT WITH PELVIS 2-3 VIEWS LEFT; Future  Trochanteric bursitis of left hip -     Ambulatory referral to Orthopedic Surgery -     meloxicam (MOBIC) 15 MG tablet; Take 1 tablet (15 mg total) by mouth daily.  Eczema herpeticum   I am having Brooke Wood start on meloxicam. I am also having her maintain her potassium chloride SA, valsartan-hydrochlorothiazide, levocetirizine, triamcinolone, rosuvastatin, aspirin EC, and fluocinonide cream.  Meds ordered this encounter  Medications  . fluocinonide cream (LIDEX) 0.05 %    Sig: Apply 1  application topically 2 (two) times daily.    Dispense:  60 g    Refill:  2  . meloxicam (MOBIC) 15 MG tablet    Sig: Take 1 tablet (15 mg total) by mouth daily.    Dispense:  90 tablet    Refill:  0     Follow-up: Return in about 4 months (around 12/12/2018).  [name], MD

## 2018-08-19 ENCOUNTER — Other Ambulatory Visit: Payer: Self-pay

## 2018-08-19 ENCOUNTER — Ambulatory Visit: Payer: BLUE CROSS/BLUE SHIELD | Admitting: Orthopaedic Surgery

## 2018-08-19 ENCOUNTER — Encounter: Payer: Self-pay | Admitting: Orthopaedic Surgery

## 2018-08-19 DIAGNOSIS — M7062 Trochanteric bursitis, left hip: Secondary | ICD-10-CM

## 2018-08-19 MED ORDER — LIDOCAINE HCL 1 % IJ SOLN
3.0000 mL | INTRAMUSCULAR | Status: AC | PRN
  Administered 2018-08-19: 15:00:00 3 mL

## 2018-08-19 MED ORDER — METHYLPREDNISOLONE ACETATE 40 MG/ML IJ SUSP
40.0000 mg | INTRAMUSCULAR | Status: AC | PRN
  Administered 2018-08-19: 40 mg via INTRA_ARTICULAR

## 2018-08-19 MED ORDER — METHOCARBAMOL 500 MG PO TABS: 500.0000 mg | ORAL_TABLET | Freq: Four times a day (QID) | ORAL | 0 refills | Status: DC | PRN

## 2018-08-19 MED FILL — METHOCARBAMOL 500 MG TABLET: 500 | 15 days supply | Qty: 60 | Fill #0

## 2018-08-19 NOTE — Progress Notes (Signed)
+                  Office Visit Note   Patient: Brooke Wood           Date of Birth: January 15, 1966           MRN: [id number] Visit Date: 08/19/2018              Requested by: [name], MD [address] [place], [place] [address] PCP: [name], MD   Assessment & Plan: Visit Diagnoses:  1. Trochanteric bursitis, left hip     Plan: I talked her about trying stretching exercises for her hips and I showed her how to do these and she demonstrated them back to me.  I then offered her steroid injection of the trochanteric area and she tolerated this well.  She said her primary care physician started her on meloxicam but she is been reluctant to take it due to the side effects that are listed.  We will send in some Robaxin to try and I recommended Voltaren gel for her especially for that area because it is getting ready to be over-the-counter at [facility].  All question concerns were answered and addressed.  Follow-up can be as needed.  Follow-Up Instructions: Return if symptoms worsen or fail to improve.   Orders:  Orders Placed This Encounter  Procedures  . Large Joint Inj   Meds ordered this encounter  Medications  . methocarbamol (ROBAXIN) 500 MG tablet    Sig: Take 1 tablet (500 mg total) by mouth every 6 (six) hours as needed for muscle spasms.    Dispense:  60 tablet    Refill:  0      Procedures: Large Joint Inj: L greater trochanter on 08/19/2018 2:42 PM Indications: pain and diagnostic evaluation Details: 22 G 1.5 in needle, lateral approach  Arthrogram: No  Medications: 3 mL lidocaine 1 %; 40 mg methylPREDNISolone acetate 40 MG/ML Outcome: tolerated well, no immediate complications Procedure, treatment alternatives, risks and  benefits explained, specific risks discussed. Consent was given by the patient. Immediately prior to procedure a time out was called to verify the correct patient, procedure, equipment, support staff and site/side marked as required. Patient was prepped and draped in the usual sterile fashion.       Clinical Data: No additional findings.   Subjective: Chief Complaint  Patient presents with  . Left Hip - Pain  The patient comes in today for evaluation treatment of a week's worth of left hip pain.  She points the side of her hip as a source of her pain.  She saw her primary care physician about this and there are x-rays actually for my review of that left hip.  She denies any groin pain.  She denies any injuries.  She is not on any diabetic medications and has had a recent A1c  below 7.  She does report occasional pain that goes down into her leg past her knee but seems like most of her pain is associated right around the lateral edge of her left hip is where she points.  She denies any leg weakness and denies any numbness and tingling in her feet.  HPI  Review of Systems She currently denies any headache, chest pain, shortness of breath, fever, chills, nausea, vomiting  Objective: Vital Signs: LMP 01/07/2016   Physical Exam She is alert and orient x3 and in no acute distress Ortho Exam Examination of both hips show that both move fluidly with internal and external rotation.  She is very sensitive and tender to palpation over the left hip trochanteric area and the IT band. Specialty Comments:  No specialty comments available.  Imaging: No results found. Independent review of x-rays on the canopy system include AP and lateral of the left hip.  The femoral head and acetabulum are well located with no significant arthritic findings at all.  There are no cortical irregularities around the trochanteric area that can be seen.  PMFS History: Patient Active Problem List   Diagnosis Date  Noted  . Acute hand eczema 08/11/2018  . Chronic left hip pain 08/11/2018  . Trochanteric bursitis of left hip 08/11/2018  . Type II diabetes mellitus with manifestations ([place]) 07/02/2018  . 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  . Abnormal electrocardiogram (ECG) (EKG) 10/08/2017  . Lymphedema of upper extremity 11/10/2014  . Routine general medical examination at a health care facility 06/03/2013  . Hyperlipidemia with target LDL less than 130 11/24/2012  . Malignant neoplasm of upper-outer quadrant of right breast in female, estrogen receptor positive ([place]) 03/06/2012  . Goiter 07/26/2009  . Essential hypertension, benign 01/19/2007  . Allergic rhinitis 01/19/2007   Past Medical History:  Diagnosis Date  . Allergy    Neg RAST 2007  . Breast cancer ([place]) 2014   right breast  . Hx of adenomatous colonic polyps 03/03/2014  . Hx of radiation therapy 06/15/12 - 07/29/12   right rbeast, 50.4 Gy x 28 fx, boost to cumulative dose 60.4 gray  . Hypertension   . Personal history of radiation therapy 2014  . PONV (postoperative nausea and vomiting)   . SVD (spontaneous vaginal delivery)    x 1  . Vocal cord dysfunction    Neg Methacholine challenge test 2007    Family History  Problem Relation Age of Onset  . Lung cancer Father 60       smoker  . Cancer Father        lung  . Hypertension Other   . Alcohol abuse Neg Hx   . Diabetes Neg Hx   . Early death Neg Hx   . Hearing loss Neg Hx   . Heart disease Neg Hx   . Hyperlipidemia Neg Hx   . Stroke Neg Hx   . Kidney disease Neg Hx   . Colon cancer Neg Hx   . Breast cancer Neg Hx     Past Surgical History:  Procedure Laterality Date  . BREAST LUMPECTOMY Right 2014  . BREAST LUMPECTOMY WITH SENTINEL LYMPH NODE BIOPSY  04/09/2012   Procedure: BREAST LUMPECTOMY WITH SENTINEL LYMPH NODE BX;  Surgeon: Merrie Roof, MD;  Location: [facility];  Service: General;  Laterality: Right;  . BREAST SURGERY  1985   rt br bx  .  COLONOSCOPY  03/2014   poylps  . CYSTOSCOPY N/A 01/22/2017   Procedure:  CYSTOSCOPY;  Surgeon: Bobbye Charleston, MD;  Location: [facility];  Service: Gynecology;  Laterality: N/A;  . ROBOTIC ASSISTED TOTAL HYSTERECTOMY WITH BILATERAL SALPINGO OOPHERECTOMY Bilateral 01/22/2017   Procedure: ROBOTIC ASSISTED TOTAL HYSTERECTOMY WITH BILATERAL SALPINGECTOMY AND LEFT OOPHORECTOMY;  Surgeon: Bobbye Charleston, MD;  Location: [facility];  Service: Gynecology;  Laterality: Bilateral;   Social History   Occupational History    Employer: UNEMPLOYED    Comment: [employer]  Tobacco Use  . Smoking status: Never Smoker  . Smokeless tobacco: Never Used  Substance and Sexual Activity  . Alcohol use: No  . Drug use: No  . Sexual activity: Not Currently    Birth control/protection: None

## 2018-08-29 ENCOUNTER — Telehealth: Payer: Self-pay | Admitting: Internal Medicine

## 2018-08-29 NOTE — Telephone Encounter (Signed)
Left message for the patient to call for information regarding this call from [facility].

## 2018-08-31 NOTE — Telephone Encounter (Signed)
Patient returned call, advised of the potential exposure to COVID-19 at the [facility] on 08/19/18 at [facility] and we are offering free testing, if agreeable. Patient says she will think about it and let us know. I advised to call [phone] between the hours 0700-1900 Monday through Friday. She says she works and what will she need to do about that, I advised that if she wants to let her employer know what I mentioned above and if testing is required by them to call [facility] for a work note, she verbalized understanding and says she will call [facility] tomorrow to see about a note first.

## 2018-09-07 ENCOUNTER — Ambulatory Visit: Payer: Self-pay

## 2018-09-07 NOTE — Telephone Encounter (Signed)
Pt called stating that she has just noticed a rash on her rt arm around her wrist and middle arm.  She states she may have gotten bug bite on the bus.  She is on her way home from work. She states it really itches.  She does not note any bite area. Care advice read to patient. Pt verbalized understanding. She will go to [facility] if symptoms progress. Will route note to PCP for update in am.  Reason for Disposition . Mild localized itching  Answer Assessment - Initial Assessment Questions 1. DESCRIPTION: "Describe the itching you are having." "Where is it located?"     Right arm 2. SEVERITY: "How bad is it?"    - MILD - doesn't interfere with normal activities   - MODERATE - SEVERE: interferes with work, school, sleep, or other activities      mild 3. SCRATCHING: "Are there any scratch marks? Bleeding?"     no 4. ONSET: "When did the itching begin?"     15 minutes ago 5. CAUSE: "What do you think is causing the itching?"      Bug bites riding city bus 6. OTHER SYMPTOMS: "Do you have any other symptoms?"      no 7. PREGNANCY: "Is there any chance you are pregnant?" "When was your last menstrual period?"    No  Protocols used: ITCHING - LOCALIZED-A-AH

## 2018-09-08 NOTE — Telephone Encounter (Signed)
lvm for pt to call back.  She may need to schedule with another provider to take care of rash sooner.

## 2018-09-11 ENCOUNTER — Other Ambulatory Visit: Payer: Self-pay | Admitting: Internal Medicine

## 2018-09-11 DIAGNOSIS — I10 Essential (primary) hypertension: Secondary | ICD-10-CM

## 2018-09-11 MED FILL — VALSARTAN-HCTZ 160-25 MG TA: 160-25 | 30 days supply | Qty: 30 | Fill #0

## 2018-09-14 ENCOUNTER — Other Ambulatory Visit: Payer: Self-pay | Admitting: Internal Medicine

## 2018-09-14 DIAGNOSIS — I10 Essential (primary) hypertension: Secondary | ICD-10-CM

## 2018-09-14 DIAGNOSIS — E118 Type 2 diabetes mellitus with unspecified complications: Secondary | ICD-10-CM

## 2018-09-14 MED ORDER — INDAPAMIDE 2.5 MG PO TABS: 2.5000 mg | ORAL_TABLET | Freq: Every day | ORAL | 0 refills | Status: DC

## 2018-09-14 MED ORDER — OLMESARTAN MEDOXOMIL 20 MG PO TABS: 20.0000 mg | ORAL_TABLET | Freq: Every day | ORAL | 1 refills | Status: DC

## 2018-09-15 ENCOUNTER — Other Ambulatory Visit: Payer: Self-pay | Admitting: Internal Medicine

## 2018-09-15 ENCOUNTER — Telehealth: Payer: Self-pay | Admitting: Internal Medicine

## 2018-09-15 NOTE — Telephone Encounter (Signed)
I got a note from a pharmacy that valsartan HCT was on back order.  That is why I made the change. If she can get valsartan HCT then she does not have to change to olmesartan and indapamide. We just need to make sure that only one pharmacy is communicating with [place].  TJ

## 2018-09-15 NOTE — Telephone Encounter (Signed)
I called pharmacy- patient filled valsartan HCT on 09/11/18. Informed pharmacy to cancel Benicar Rx.

## 2018-09-15 NOTE — Telephone Encounter (Signed)
Copied from [place] [id number]. Topic: Quick Communication - Rx Refill/Question >> Sep 15, 2018  8:54 AM Sheran Luz wrote: Medication:olmesartan (BENICAR) 20 MG tablet  Delilah Shan, with [facility], calling requesting clarification on this medication. She states that valsartan-hydrochlorothiazide (DIOVAN-HCT) 160-25 MG tablet was recently filled and inquired if new medication was meant to be replacement. Please advise.

## 2018-09-25 MED FILL — ASPIRIN ADULT LOW STRENGTH: 81 | 90 days supply | Qty: 90 | Fill #1

## 2018-09-25 MED FILL — ROSUVASTATIN CALCIUM 5 MG T: 5 | 90 days supply | Qty: 90 | Fill #1

## 2018-09-28 ENCOUNTER — Encounter: Payer: Self-pay | Admitting: Internal Medicine

## 2018-10-05 ENCOUNTER — Ambulatory Visit (INDEPENDENT_AMBULATORY_CARE_PROVIDER_SITE_OTHER): Payer: BC Managed Care – PPO | Admitting: Internal Medicine

## 2018-10-05 ENCOUNTER — Other Ambulatory Visit (INDEPENDENT_AMBULATORY_CARE_PROVIDER_SITE_OTHER): Payer: BC Managed Care – PPO

## 2018-10-05 ENCOUNTER — Other Ambulatory Visit: Payer: Self-pay

## 2018-10-05 ENCOUNTER — Encounter: Payer: Self-pay | Admitting: Internal Medicine

## 2018-10-05 VITALS — BP 124/82 | HR 77 | Temp 98.2°F | Resp 16 | Ht 66.0 in | Wt 155.5 lb

## 2018-10-05 DIAGNOSIS — E118 Type 2 diabetes mellitus with unspecified complications: Secondary | ICD-10-CM | POA: Diagnosis not present

## 2018-10-05 DIAGNOSIS — I10 Essential (primary) hypertension: Secondary | ICD-10-CM | POA: Diagnosis not present

## 2018-10-05 LAB — BASIC METABOLIC PANEL
BUN: 18 mg/dL (ref 6–23)
CO2: 29 mEq/L (ref 19–32)
Calcium: 9.8 mg/dL (ref 8.4–10.5)
Chloride: 100 mEq/L (ref 96–112)
Creatinine, Ser: 0.68 mg/dL (ref 0.40–1.20)
GFR: 108.55 mL/min (ref >60.00)
Glucose, Bld: 96 mg/dL (ref 70–99)
Potassium: 3.5 mEq/L (ref 3.5–5.1)
Sodium: 139 mEq/L (ref 135–145)

## 2018-10-05 LAB — MICROALBUMIN / CREATININE URINE RATIO
Creatinine,U: 90 mg/dL
Microalb Creat Ratio: 0.8 mg/g (ref 0.0–30.0)
Microalb, Ur: <0.7 mg/dL (ref 0.0–1.9)

## 2018-10-05 LAB — HEMOGLOBIN A1C: Hgb A1c MFr Bld: 6.4 % (ref 4.6–6.5)

## 2018-10-05 NOTE — Patient Instructions (Signed)
Type 2 Diabetes Mellitus, Diagnosis, Adult Type 2 diabetes (type 2 diabetes mellitus) is a long-term (chronic) disease. In type 2 diabetes, one or both of these problems may be present:  The pancreas does not make enough of a hormone called insulin.  Cells in the body do not respond properly to insulin that the body makes (insulin resistance). Normally, insulin allows blood sugar (glucose) to enter cells in the body. The cells use glucose for energy. Insulin resistance or lack of insulin causes excess glucose to build up in the blood instead of going into cells. As a result, high blood glucose (hyperglycemia) develops. What increases the risk? The following factors may make you more likely to develop type 2 diabetes:  Having a family member with type 2 diabetes.  Being overweight or obese.  Having an inactive (sedentary) lifestyle.  Having been diagnosed with insulin resistance.  Having a history of prediabetes, gestational diabetes, or polycystic ovary syndrome (PCOS).  Being of American-Indian, African-American, Hispanic/Latino, or Asian/Pacific Islander descent. What are the signs or symptoms? In the early stage of this condition, you may not have symptoms. Symptoms develop slowly and may include:  Increased thirst (polydipsia).  Increased hunger(polyphagia).  Increased urination (polyuria).  Increased urination during the night (nocturia).  Unexplained weight loss.  Frequent infections that keep coming back (recurring).  Fatigue.  Weakness.  Vision changes, such as blurry vision.  Cuts or bruises that are slow to heal.  Tingling or numbness in the hands or feet.  Dark patches on the skin (acanthosis nigricans). How is this diagnosed? This condition is diagnosed based on your symptoms, your medical history, a physical exam, and your blood glucose level. Your blood glucose may be checked with one or more of the following blood tests:  A fasting blood glucose (FBG)  test. You will not be allowed to eat (you will fast) for 8 hours or longer before a blood sample is taken.  A random blood glucose test. This test checks blood glucose at any time of day regardless of when you ate.  An A1c (hemoglobin A1c) blood test. This test provides information about blood glucose control over the previous 2-3 months.  An oral glucose tolerance test (OGTT). This test measures your blood glucose at two times: ? After fasting. This is your baseline blood glucose level. ? Two hours after drinking a beverage that contains glucose. You may be diagnosed with type 2 diabetes if:  Your FBG level is 126 mg/dL (7.0 mmol/L) or higher.  Your random blood glucose level is 200 mg/dL (11.1 mmol/L) or higher.  Your A1c level is 6.5% or higher.  Your OGTT result is higher than 200 mg/dL (11.1 mmol/L). These blood tests may be repeated to confirm your diagnosis. How is this treated? Your treatment may be managed by a specialist called an endocrinologist. Type 2 diabetes may be treated by following instructions from your health care provider about:  Making diet and lifestyle changes. This may include: ? Following an individualized nutrition plan that is developed by a diet and nutrition specialist (registered dietitian). ? Exercising regularly. ? Finding ways to manage stress.  Checking your blood glucose level as often as told.  Taking diabetes medicines or insulin daily. This helps to keep your blood glucose levels in the healthy range. ? If you use insulin, you may need to adjust the dosage depending on how physically active you are and what foods you eat. Your health care provider will tell you how to adjust your dosage.    Taking medicines to help prevent complications from diabetes, such as: ? Aspirin. ? Medicine to lower cholesterol. ? Medicine to control blood pressure. Your health care provider will set individualized treatment goals for you. Your goals will be based on  your age, other medical conditions you have, and how you respond to diabetes treatment. Generally, the goal of treatment is to maintain the following blood glucose levels:  Before meals (preprandial): 80-130 mg/dL (4.4-7.2 mmol/L).  After meals (postprandial): below 180 mg/dL (10 mmol/L).  A1c level: less than 7%. Follow these instructions at home: Questions to ask your health care provider  Consider asking the following questions: ? Do I need to meet with a diabetes educator? ? Where can I find a support group for people with diabetes? ? What equipment will I need to manage my diabetes at home? ? What diabetes medicines do I need, and when should I take them? ? How often do I need to check my blood glucose? ? What number can I call if I have questions? ? When is my next appointment? General instructions  Take over-the-counter and prescription medicines only as told by your health care provider.  Keep all follow-up visits as told by your health care provider. This is important.  For more information about diabetes, visit: ? American Diabetes Association (ADA): www.diabetes.org ? American Association of Diabetes Educators (AADE): www.diabeteseducator.org Contact a health care provider if:  Your blood glucose is at or above 240 mg/dL (13.3 mmol/L) for 2 days in a row.  You have been sick or have had a fever for 2 days or longer, and you are not getting better.  You have any of the following problems for more than 6 hours: ? You cannot eat or drink. ? You have nausea and vomiting. ? You have diarrhea. Get help right away if:  Your blood glucose is lower than 54 mg/dL (3.0 mmol/L).  You become confused or you have trouble thinking clearly.  You have difficulty breathing.  You have moderate or large ketone levels in your urine. Summary  Type 2 diabetes (type 2 diabetes mellitus) is a long-term (chronic) disease. In type 2 diabetes, the pancreas does not make enough of a  hormone called insulin, or cells in the body do not respond properly to insulin that the body makes (insulin resistance).  This condition is treated by making diet and lifestyle changes and taking diabetes medicines or insulin.  Your health care provider will set individualized treatment goals for you. Your goals will be based on your age, other medical conditions you have, and how you respond to diabetes treatment.  Keep all follow-up visits as told by your health care provider. This is important. This information is not intended to replace advice given to you by your health care provider. Make sure you discuss any questions you have with your health care provider. Document Released: 03/18/2005 Document Revised: 05/16/2017 Document Reviewed: 04/21/2015 Elsevier Patient Education  2020 Elsevier Inc.  

## 2018-10-05 NOTE — Progress Notes (Signed)
Subjective:  Patient ID: Brooke Wood, female    DOB: Mar 17, 1966  Age: 53 y.o. MRN: [id number]  CC: Hypertension and Diabetes   HPI Brooke Wood presents for f/up - She has been working diligently on her lifestyle modifications with diet, exercise, weight loss.  She tells me this has helped lower her blood sugars and maintain good blood pressure control.  She feels well today and offers no complaints.  Outpatient Medications Prior to Visit  Medication Sig Dispense Refill  . aspirin EC 81 MG tablet Take 1 tablet (81 mg total) by mouth daily. 90 tablet 1  . fluocinonide cream (LIDEX) 3.00 % Apply 1 application topically 2 (two) times daily. 60 g 2  . levocetirizine (XYZAL) 5 MG tablet Take 1 tablet (5 mg total) by mouth every evening. 90 tablet 1  . rosuvastatin (CRESTOR) 5 MG tablet Take 1 tablet (5 mg total) by mouth daily. 90 tablet 1  . triamcinolone (NASACORT) 55 MCG/ACT AERO nasal inhaler Place 2 sprays into the nose daily. 32.4 mL 3  . valsartan-hydrochlorothiazide (DIOVAN-HCT) 160-25 MG tablet Take 1 tablet by mouth daily. Annual appt due in Sept must see provider for future refills 90 tablet 0  . meloxicam (MOBIC) 15 MG tablet Take 1 tablet (15 mg total) by mouth daily. (Patient not taking: Reported on 10/05/2018) 90 tablet 0  . methocarbamol (ROBAXIN) 500 MG tablet Take 1 tablet (500 mg total) by mouth every 6 (six) hours as needed for muscle spasms. (Patient not taking: Reported on 10/05/2018) 60 tablet 0  . potassium chloride SA (K-DUR,KLOR-CON) 20 MEQ tablet TAKE 1 TABLET BY MOUTH 3 TIMES DAILY (Patient not taking: Reported on 10/05/2018) 90 tablet 3   No facility-administered medications prior to visit.     ROS Review of Systems  Constitutional: Negative for diaphoresis, fatigue and unexpected weight change.  HENT: Negative.   Eyes: Negative for visual disturbance.  Respiratory: Negative for cough, chest tightness, shortness of breath and wheezing.   Cardiovascular: Negative  for chest pain, palpitations and leg swelling.  Gastrointestinal: Negative for abdominal pain, diarrhea, nausea and vomiting.  Endocrine: Negative.  Negative for polydipsia and polyphagia.  Genitourinary: Negative.  Negative for difficulty urinating.  Musculoskeletal: Negative.  Negative for arthralgias and myalgias.  Skin: Negative.  Negative for color change.  Neurological: Negative.  Negative for dizziness, weakness and light-headedness.  Hematological: Negative for adenopathy. Does not bruise/bleed easily.  Psychiatric/Behavioral: Negative.     Objective:  BP 124/82 (BP Location: Left Arm, Patient Position: Sitting, Cuff Size: Normal)   Pulse 77   Temp 98.2 F (36.8 C) (Oral)   Resp 16   Ht 5\' 6"  (1.676 m)   Wt 155 lb 8 oz (70.5 kg)   LMP 01/07/2016   SpO2 99%   BMI 25.10 kg/m   BP Readings from Last 3 Encounters:  10/05/18 124/82  08/11/18 124/74  07/02/18 128/80    Wt Readings from Last 3 Encounters:  10/05/18 155 lb 8 oz (70.5 kg)  08/11/18 162 lb 4 oz (73.6 kg)  07/02/18 163 lb 8 oz (74.2 kg)    Physical Exam Vitals signs reviewed.  Constitutional:      Appearance: She is not ill-appearing or diaphoretic.  HENT:     Nose: Nose normal. No congestion.     Mouth/Throat:     Mouth: Mucous membranes are moist.  Eyes:     General: No scleral icterus.    Conjunctiva/sclera: Conjunctivae normal.  Neck:  Musculoskeletal: Normal range of motion. No neck rigidity or muscular tenderness.  Cardiovascular:     Rate and Rhythm: Normal rate and regular rhythm.     Heart sounds: No murmur.  Pulmonary:     Effort: Pulmonary effort is normal.     Breath sounds: No stridor. No wheezing, rhonchi or rales.  Abdominal:     General: Abdomen is flat. Bowel sounds are normal. There is no distension.     Palpations: There is no hepatomegaly or splenomegaly.     Tenderness: There is no abdominal tenderness.  Musculoskeletal: Normal range of motion.     Right lower leg: No  edema.     Left lower leg: No edema.  Lymphadenopathy:     Cervical: No cervical adenopathy.  Skin:    General: Skin is warm and dry.     Coloration: Skin is not pale.  Neurological:     General: No focal deficit present.  Psychiatric:        Mood and Affect: Mood normal.        Behavior: Behavior normal.     Lab Results  Component Value Date   WBC 7.8 07/02/2018   HGB 14.7 07/02/2018   HCT 43.9 07/02/2018   PLT 233.0 07/02/2018   GLUCOSE 96 10/05/2018   CHOL 165 07/02/2018   TRIG 136.0 07/02/2018   HDL 55.20 07/02/2018   LDLCALC 82 07/02/2018   ALT 19 07/02/2018   AST 11 07/02/2018   NA 139 10/05/2018   K 3.5 10/05/2018   CL 100 10/05/2018   CREATININE 0.68 10/05/2018   BUN 18 10/05/2018   CO2 29 10/05/2018   TSH 1.40 07/02/2018   HGBA1C 6.4 10/05/2018   MICROALBUR <0.7 10/05/2018    Dg Hip Unilat With Pelvis 2-3 Views Left  Result Date: 08/12/2018 CLINICAL DATA:  Left side hip pain.  No known injury. EXAM: DG HIP (WITH OR WITHOUT PELVIS) 2-3V LEFT COMPARISON:  06/27/2016 FINDINGS: There is no evidence of hip fracture or dislocation. There is no evidence of arthropathy or other focal bone abnormality. IMPRESSION: Negative. Electronically Signed   By: Rolm Baptise M.D.   On: 08/12/2018 09:02    Assessment & Plan:   Brooke Wood was seen today for hypertension and diabetes.  Diagnoses and all orders for this visit:  Essential hypertension, benign- Her blood pressure is adequately well controlled. -     Basic metabolic panel; Future  Type II diabetes mellitus with manifestations ([place])- Her A1c is down to 6.4%.  She was praised for her lifestyle modifications.  Medical therapy is not indicated. -     Basic metabolic panel; Future -     Microalbumin / creatinine urine ratio; Future -     Hemoglobin A1c; Future   I am having Brooke Wood maintain her potassium chloride SA, levocetirizine, triamcinolone, rosuvastatin, aspirin EC, fluocinonide cream, meloxicam,  methocarbamol, and valsartan-hydrochlorothiazide.  No orders of the defined types were placed in this encounter.    Follow-up: Return in about 6 months (around 04/07/2019).  Brooke Calico, MD

## 2018-10-06 ENCOUNTER — Encounter: Payer: Self-pay | Admitting: Internal Medicine

## 2018-10-12 MED FILL — VALSARTAN-HCTZ 160-25 MG TA: 160-25 | 30 days supply | Qty: 30 | Fill #1

## 2018-10-15 ENCOUNTER — Encounter: Payer: Self-pay | Admitting: Internal Medicine

## 2018-10-15 DIAGNOSIS — H25013 Cortical age-related cataract, bilateral: Secondary | ICD-10-CM | POA: Diagnosis not present

## 2018-10-15 DIAGNOSIS — H18413 Arcus senilis, bilateral: Secondary | ICD-10-CM | POA: Diagnosis not present

## 2018-10-15 DIAGNOSIS — H11153 Pinguecula, bilateral: Secondary | ICD-10-CM | POA: Diagnosis not present

## 2018-10-15 DIAGNOSIS — E119 Type 2 diabetes mellitus without complications: Secondary | ICD-10-CM | POA: Diagnosis not present

## 2018-10-15 LAB — HM DIABETES EYE EXAM

## 2018-10-26 DIAGNOSIS — Z01419 Encounter for gynecological examination (general) (routine) without abnormal findings: Secondary | ICD-10-CM | POA: Diagnosis not present

## 2018-10-26 DIAGNOSIS — Z6825 Body mass index (BMI) 25.0-25.9, adult: Secondary | ICD-10-CM | POA: Diagnosis not present

## 2018-10-26 DIAGNOSIS — Z1272 Encounter for screening for malignant neoplasm of vagina: Secondary | ICD-10-CM | POA: Diagnosis not present

## 2018-11-11 MED FILL — VALSARTAN-HCTZ 160-25 MG TA: 160-25 | 30 days supply | Qty: 30 | Fill #2

## 2018-11-17 DIAGNOSIS — C50411 Malignant neoplasm of upper-outer quadrant of right female breast: Secondary | ICD-10-CM | POA: Diagnosis not present

## 2018-11-19 DIAGNOSIS — Z6824 Body mass index (BMI) 24.0-24.9, adult: Secondary | ICD-10-CM | POA: Diagnosis not present

## 2018-11-19 DIAGNOSIS — N760 Acute vaginitis: Secondary | ICD-10-CM | POA: Diagnosis not present

## 2018-12-02 ENCOUNTER — Ambulatory Visit (INDEPENDENT_AMBULATORY_CARE_PROVIDER_SITE_OTHER): Payer: BC Managed Care – PPO | Admitting: Internal Medicine

## 2018-12-02 ENCOUNTER — Other Ambulatory Visit: Payer: Self-pay

## 2018-12-02 ENCOUNTER — Encounter: Payer: Self-pay | Admitting: Internal Medicine

## 2018-12-02 ENCOUNTER — Ambulatory Visit: Payer: Self-pay | Admitting: *Deleted

## 2018-12-02 VITALS — BP 110/74 | HR 99 | Temp 98.0°F | Resp 16 | Ht 66.0 in | Wt 152.8 lb

## 2018-12-02 DIAGNOSIS — L240 Irritant contact dermatitis due to detergents: Secondary | ICD-10-CM | POA: Diagnosis not present

## 2018-12-02 DIAGNOSIS — L309 Dermatitis, unspecified: Secondary | ICD-10-CM

## 2018-12-02 DIAGNOSIS — Z23 Encounter for immunization: Secondary | ICD-10-CM

## 2018-12-02 MED ORDER — FLUOCINONIDE 0.05 % EX CREA: 1.0000 "application " | TOPICAL_CREAM | Freq: Two times a day (BID) | CUTANEOUS | 2 refills | Status: DC

## 2018-12-02 MED FILL — FLUOCINONIDE 0.05% CREAM: 0.05 | 30 days supply | Qty: 60 | Fill #0

## 2018-12-02 NOTE — Patient Instructions (Signed)
Contact Dermatitis Dermatitis is redness, soreness, and swelling (inflammation) of the skin. Contact dermatitis is a reaction to certain substances that touch the skin. Many different substances can cause contact dermatitis. There are two types of contact dermatitis:  Irritant contact dermatitis. This type is caused by something that irritates your skin, such as having dry hands from washing them too often with soap. This type does not require previous exposure to the substance for a reaction to occur. This is the most common type.  Allergic contact dermatitis. This type is caused by a substance that you are allergic to, such as poison ivy. This type occurs when you have been exposed to the substance (allergen) and develop a sensitivity to it. Dermatitis may develop soon after your first exposure to the allergen, or it may not develop until the next time you are exposed and every time thereafter. What are the causes? Irritant contact dermatitis is most commonly caused by exposure to:  Makeup.  Soaps.  Detergents.  Bleaches.  Acids.  Metal salts, such as nickel. Allergic contact dermatitis is most commonly caused by exposure to:  Poisonous plants.  Chemicals.  Jewelry.  Latex.  Medicines.  Preservatives in products, such as clothing. What increases the risk? You are more likely to develop this condition if you have:  A job that exposes you to irritants or allergens.  Certain medical conditions, such as asthma or eczema. What are the signs or symptoms? Symptoms of this condition may occur on your body anywhere the irritant has touched you or is touched by you.  Symptoms include: ? Dryness or flaking. ? Redness. ? Cracks. ? Itching. ? Pain or a burning feeling. ? Blisters. ? Drainage of small amounts of blood or clear fluid from skin cracks. With allergic contact dermatitis, there may also be swelling in areas such as the eyelids, mouth, or genitals. How is this  diagnosed? This condition is diagnosed with a medical history and physical exam.  A patch skin test may be performed to help determine the cause.  If the condition is related to your job, you may need to see an occupational medicine specialist. How is this treated? This condition is treated by checking for the cause of the reaction and protecting your skin from further contact. Treatment may also include:  Steroid creams or ointments. Oral steroid medicines may be needed in more severe cases.  Antibiotic medicines or antibacterial ointments, if a skin infection is present.  Antihistamine lotion or an antihistamine taken by mouth to ease itching.  A bandage (dressing). Follow these instructions at home: Skin care  Moisturize your skin as needed.  Apply cool compresses to the affected areas.  Try applying baking soda paste to your skin. Stir water into baking soda until it reaches a paste-like consistency.  Do not scratch your skin, and avoid friction to the affected area.  Avoid the use of soaps, perfumes, and dyes. Medicines  Take or apply over-the-counter and prescription medicines only as told by your health care provider.  If you were prescribed an antibiotic medicine, take or apply the antibiotic as told by your health care provider. Do not stop using the antibiotic even if your condition improves. Bathing  Try taking a bath with: ? Epsom salts. Follow the instructions on the packaging. You can get these at your local pharmacy or grocery store. ? Baking soda. Pour a small amount into the bath as directed by your health care provider. ? Colloidal oatmeal. Follow the instructions on the   packaging. You can get this at your local pharmacy or grocery store.  Bathe less frequently, such as every other day.  Bathe in lukewarm water. Avoid using hot water. Bandage care  If you were given a bandage (dressing), change it as told by your health care provider.  Wash your hands  with soap and water before and after you change your dressing. If soap and water are not available, use hand sanitizer. General instructions  Avoid the substance that caused your reaction. If you do not know what caused it, keep a journal to try to track what caused it. Write down: ? What you eat. ? What cosmetic products you use. ? What you drink. ? What you wear in the affected area. This includes jewelry.  More redness, swelling, or pain.  More fluid or blood.  Warmth.  Pus or a bad smell.  Keep all follow-up visits as told by your health care provider. This is important. Contact a health care provider if:  Your condition does not improve with treatment.  Your condition gets worse.  You have signs of infection such as swelling, tenderness, redness, soreness, or warmth in the affected area.  You have a fever.  You have new symptoms. Get help right away if:  You have a severe headache, neck pain, or neck stiffness.  You vomit.  You feel very sleepy.  You notice red streaks coming from the affected area.  Your bone or joint underneath the affected area becomes painful after the skin has healed.  The affected area turns darker.  You have difficulty breathing. Summary  Dermatitis is redness, soreness, and swelling (inflammation) of the skin. Contact dermatitis is a reaction to certain substances that touch the skin.  Symptoms of this condition may occur on your body anywhere the irritant has touched you or is touched by you.  This condition is treated by figuring out what caused the reaction and protecting your skin from further contact. Treatment may also include medicines and skin care.  Avoid the substance that caused your reaction. If you do not know what caused it, keep a journal to try to track what caused it.  Contact a health care provider if your condition gets worse or you have signs of infection such as swelling, tenderness, redness, soreness, or warmth  in the affected area. This information is not intended to replace advice given to you by your health care provider. Make sure you discuss any questions you have with your health care provider. Document Released: 03/15/2000 Document Revised: 07/08/2018 Document Reviewed: 10/01/2017 Elsevier Patient Education  2020 Elsevier Inc.  

## 2018-12-02 NOTE — Telephone Encounter (Signed)
Pt called stating she has a rash on both inner thighs; it is red and itchy; she noticed in Monday 11/30/18; she has not taken any medication for her symptom; she has Fluocinenidine cream exzema on her hands; recommendations made per nurse triage protocol; she verbalized understanding, but would like to be seen by her provider; she sees Dr [name], [facility]; pt transferred to [facility] for scheduling.  Reason for Disposition . Mild localized rash  Answer Assessment - Initial Assessment Questions 1. APPEARANCE of RASH: "Describe the rash."      red 2. LOCATION: "Where is the rash located?"     Bilateral inner thigh 3. NUMBER: "How many spots are there?"   too many to county 4. SIZE: "How big are the spots?" (Inches, centimeters or compare to size of a coin)     "medium sized" 5. ONSET: "When did the rash start?"      11/30/2018 6. ITCHING: "Does the rash itch?" If so, ask: "How bad is the itch?"  (Scale 1-10; or mild, moderate, severe)     6-7 out of 10 7. PAIN: "Does the rash hurt?" If so, ask: "How bad is the pain?"  (Scale 1-10; or mild, moderate, severe)     no 8. OTHER SYMPTOMS: "Do you have any other symptoms?" (e.g., fever)     no 9. PREGNANCY: "Is there any chance you are pregnant?" "When was your last menstrual period?"   No hysterectomy  Protocols used: RASH OR REDNESS - LOCALIZED-A-AH

## 2018-12-02 NOTE — Progress Notes (Signed)
Subjective:  Patient ID: Brooke Wood, female    DOB: [birth year]  Age: 53 y.o. MRN: [id number]  CC: Rash   HPI Brooke Wood presents for a 2-day history of itchy rash over both medial thighs.  She has prescriptions at home for a topical steroid and oral antihistamine but she has elected not to use those.  She is not aware of being exposed anything that could have precipitated this.  Outpatient Medications Prior to Visit  Medication Sig Dispense Refill  . aspirin EC 81 MG tablet Take 1 tablet (81 mg total) by mouth daily. 90 tablet 1  . levocetirizine (XYZAL) 5 MG tablet Take 1 tablet (5 mg total) by mouth every evening. 90 tablet 1  . meloxicam (MOBIC) 15 MG tablet Take 1 tablet (15 mg total) by mouth daily. 90 tablet 0  . methocarbamol (ROBAXIN) 500 MG tablet Take 1 tablet (500 mg total) by mouth every 6 (six) hours as needed for muscle spasms. 60 tablet 0  . potassium chloride SA (K-DUR,KLOR-CON) 20 MEQ tablet TAKE 1 TABLET BY MOUTH 3 TIMES DAILY 90 tablet 3  . rosuvastatin (CRESTOR) 5 MG tablet Take 1 tablet (5 mg total) by mouth daily. 90 tablet 1  . triamcinolone (NASACORT) 55 MCG/ACT AERO nasal inhaler Place 2 sprays into the nose daily. 32.4 mL 3  . valsartan-hydrochlorothiazide (DIOVAN-HCT) 160-25 MG tablet Take 1 tablet by mouth daily. Annual appt due in Sept must see provider for future refills 90 tablet 0  . fluocinonide cream (LIDEX) AB-123456789 % Apply 1 application topically 2 (two) times daily. 60 g 2   No facility-administered medications prior to visit.     ROS Review of Systems  Objective:  BP 110/74 (BP Location: Left Arm, Patient Position: Sitting, Cuff Size: Normal)   Pulse 99   Temp 98 F (36.7 C) (Oral)   Resp 16   Ht 5\' 6"  (1.676 m)   Wt 152 lb 12 oz (69.3 kg)   LMP 01/07/2016   SpO2 98%   BMI 24.65 kg/m   BP Readings from Last 3 Encounters:  12/02/18 110/74  10/05/18 124/82  08/11/18 124/74    Wt Readings from Last 3 Encounters:  12/02/18 152  lb 12 oz (69.3 kg)  10/05/18 155 lb 8 oz (70.5 kg)  08/11/18 162 lb 4 oz (73.6 kg)    Physical Exam HENT:     Nose: Nose normal.     Mouth/Throat:     Mouth: Mucous membranes are moist.     Pharynx: No oropharyngeal exudate.  Eyes:     General: No scleral icterus. Neck:     Musculoskeletal: Normal range of motion. No neck rigidity.  Cardiovascular:     Rate and Rhythm: Normal rate and regular rhythm.     Heart sounds: No murmur.  Pulmonary:     Effort: Pulmonary effort is normal.     Breath sounds: No wheezing, rhonchi or rales.  Abdominal:     General: Abdomen is flat.     Palpations: There is no mass.     Tenderness: There is no abdominal tenderness.  Musculoskeletal:       Legs:  Skin:    Coloration: Skin is not jaundiced.     Findings: Erythema and rash present. No abrasion or ecchymosis. Rash is not macular, papular, pustular or scaling.  Neurological:     General: No focal deficit present.     Mental Status: She is alert.     Lab  Results  Component Value Date   WBC 7.8 07/02/2018   HGB 14.7 07/02/2018   HCT 43.9 07/02/2018   PLT 233.0 07/02/2018   GLUCOSE 96 10/05/2018   CHOL 165 07/02/2018   TRIG 136.0 07/02/2018   HDL 55.20 07/02/2018   LDLCALC 82 07/02/2018   ALT 19 07/02/2018   AST 11 07/02/2018   NA 139 10/05/2018   K 3.5 10/05/2018   CL 100 10/05/2018   CREATININE 0.68 10/05/2018   BUN 18 10/05/2018   CO2 29 10/05/2018   TSH 1.40 07/02/2018   HGBA1C 6.4 10/05/2018   MICROALBUR <0.7 10/05/2018    Dg Hip Unilat With Pelvis 2-3 Views Left  Result Date: 08/12/2018 CLINICAL DATA:  Left side hip pain.  No known injury. EXAM: DG HIP (WITH OR WITHOUT PELVIS) 2-3V LEFT COMPARISON:  06/27/2016 FINDINGS: There is no evidence of hip fracture or dislocation. There is no evidence of arthropathy or other focal bone abnormality. IMPRESSION: Negative. Electronically Signed   By: Rolm Baptise M.D.   On: 08/12/2018 09:02    Assessment & Plan:   Chanteria was  seen today for rash.  Diagnoses and all orders for this visit:  Need for influenza vaccination -     Flu Vaccine QUAD 36+ mos IM  Irritant contact dermatitis due to detergent- I recommended that she start taking the Xyzal that was previously prescribed.  I have also recommended that she start using fluocinonide cream on the affected area.  She will change her detergents, she will clean her clothes thoroughly, she will let me know if she develops any new or worsening symptoms. -     fluocinonide cream (LIDEX) 0.05 %; Apply 1 application topically 2 (two) times daily.  Acute hand eczema   I am having Brooke Wood maintain her potassium chloride SA, levocetirizine, triamcinolone, rosuvastatin, aspirin EC, meloxicam, methocarbamol, valsartan-hydrochlorothiazide, and fluocinonide cream.  Meds ordered this encounter  Medications  . fluocinonide cream (LIDEX) 0.05 %    Sig: Apply 1 application topically 2 (two) times daily.    Dispense:  60 g    Refill:  2     Follow-up: Return if symptoms worsen or fail to improve.  Brooke Calico, MD

## 2018-12-10 ENCOUNTER — Other Ambulatory Visit: Payer: Self-pay | Admitting: Internal Medicine

## 2018-12-10 DIAGNOSIS — I10 Essential (primary) hypertension: Secondary | ICD-10-CM

## 2018-12-10 MED FILL — VALSARTAN-HCTZ 160-25 MG TA: 160-25 | 30 days supply | Qty: 30 | Fill #0

## 2018-12-31 ENCOUNTER — Other Ambulatory Visit: Payer: Self-pay | Admitting: Internal Medicine

## 2018-12-31 DIAGNOSIS — E785 Hyperlipidemia, unspecified: Secondary | ICD-10-CM

## 2018-12-31 MED FILL — ROSUVASTATIN CALCIUM 5 MG T: 5 | 90 days supply | Qty: 90 | Fill #0

## 2018-12-31 MED FILL — ASPIRIN 81 MG TBEC: 81 | 90 days supply | Qty: 90 | Fill #0

## 2018-12-31 NOTE — Telephone Encounter (Signed)
Routing to CMA 

## 2018-12-31 NOTE — Telephone Encounter (Signed)
Patient called in stating she is needing this filled ASAP. Patient states she does not want to have a stroke. States she will be by pharmacy later today.

## 2019-01-01 ENCOUNTER — Inpatient Hospital Stay: Payer: BC Managed Care – PPO | Attending: Adult Health | Admitting: Adult Health

## 2019-01-01 ENCOUNTER — Encounter: Payer: Self-pay | Admitting: Adult Health

## 2019-01-01 ENCOUNTER — Other Ambulatory Visit: Payer: Self-pay

## 2019-01-01 VITALS — BP 123/72 | HR 64 | Temp 97.8°F | Resp 18 | Ht 66.0 in | Wt 153.6 lb

## 2019-01-01 DIAGNOSIS — Z90722 Acquired absence of ovaries, bilateral: Secondary | ICD-10-CM | POA: Insufficient documentation

## 2019-01-01 DIAGNOSIS — Z801 Family history of malignant neoplasm of trachea, bronchus and lung: Secondary | ICD-10-CM | POA: Diagnosis not present

## 2019-01-01 DIAGNOSIS — Z923 Personal history of irradiation: Secondary | ICD-10-CM | POA: Diagnosis not present

## 2019-01-01 DIAGNOSIS — Z170 Estrogen receptor positive status [ER+]: Secondary | ICD-10-CM | POA: Insufficient documentation

## 2019-01-01 DIAGNOSIS — I10 Essential (primary) hypertension: Secondary | ICD-10-CM | POA: Insufficient documentation

## 2019-01-01 DIAGNOSIS — Z9079 Acquired absence of other genital organ(s): Secondary | ICD-10-CM | POA: Diagnosis not present

## 2019-01-01 DIAGNOSIS — Z90710 Acquired absence of both cervix and uterus: Secondary | ICD-10-CM | POA: Diagnosis not present

## 2019-01-01 DIAGNOSIS — Z853 Personal history of malignant neoplasm of breast: Secondary | ICD-10-CM | POA: Insufficient documentation

## 2019-01-01 DIAGNOSIS — Z79899 Other long term (current) drug therapy: Secondary | ICD-10-CM | POA: Diagnosis not present

## 2019-01-01 DIAGNOSIS — C50411 Malignant neoplasm of upper-outer quadrant of right female breast: Secondary | ICD-10-CM

## 2019-01-01 DIAGNOSIS — Z791 Long term (current) use of non-steroidal anti-inflammatories (NSAID): Secondary | ICD-10-CM | POA: Insufficient documentation

## 2019-01-01 NOTE — Progress Notes (Signed)
CLINIC:  Survivorship   REASON FOR VISIT:  Routine follow-up for history of breast cancer.   BRIEF ONCOLOGIC HISTORY:   (1) status post right breast biopsy on 03/11/2012 for an invasive ductal carcinoma with tubular features, grade 2, ER positive, PR positive, HER-2/neu negative, Ki-67 20%  (2) status post definitive right lumpectomy and sentinel lymph node biopsy on 04/09/2012. 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 One sentinel node had isolated tumor cells. All margins clean.              (a) in the 2018 calcification her tumor would be stage IB  (3) Oncotype DX performed. Her score was 10, giving her a 7% risk of outside the breast recurrence if her only systemic therapy is tamoxifen for 5 years  (4) She completed radiation therapy in April 2015  (5) She began tamoxifen in May 2014 completing 5 years April 2019  (6) status post hysterectomy with left salpingo-oophorectomy January 22, 2017, with benign pathology  INTERVAL HISTORY:  Ms. Benevides presents to the [facility] today for routine follow-up for her history of breast cancer.  Overall, she reports feeling quite well.   She has no issues today.  She notes that she is very nervous about her health and cancer returning.  She denies any new issues today and is up to date with seeing her PCP and all of her cancer screenings.    REVIEW OF SYSTEMS:  Review of Systems  Constitutional: Negative for appetite change, chills, fatigue, fever and unexpected weight change.  HENT:   Negative for hearing loss, lump/mass and trouble swallowing.   Eyes: Negative for eye problems and icterus.  Respiratory: Negative for chest tightness, cough and shortness of breath.   Cardiovascular: Negative for chest pain, leg swelling and palpitations.  Gastrointestinal: Negative for abdominal distention, abdominal pain, constipation, diarrhea,  nausea and vomiting.  Endocrine: Negative for hot flashes.  Skin: Negative for itching and rash.  Neurological: Negative for dizziness, extremity weakness, headaches and numbness.  Hematological: Negative for adenopathy. Does not bruise/bleed easily.   Breast: Denies any new nodularity, masses, tenderness, nipple changes, or nipple discharge.       PAST MEDICAL/SURGICAL HISTORY:  Past Medical History:  Diagnosis Date  . Allergy    Neg RAST 2007  . Breast cancer ([facility]) 2014   right breast  . Hx of adenomatous colonic polyps 03/03/2014  . Hx of radiation therapy 06/15/12 - 07/29/12   right rbeast, 50.4 Gy x 28 fx, boost to cumulative dose 60.4 gray  . Hypertension   . Personal history of radiation therapy 2014  . PONV (postoperative nausea and vomiting)   . SVD (spontaneous vaginal delivery)    x 1  . Vocal cord dysfunction    Neg Methacholine challenge test 2007   Past Surgical History:  Procedure Laterality Date  . BREAST LUMPECTOMY Right 2014  . BREAST LUMPECTOMY WITH SENTINEL LYMPH NODE BIOPSY  04/09/2012   Procedure: BREAST LUMPECTOMY WITH SENTINEL LYMPH NODE BX;  Surgeon: Merrie Roof, MD;  Location: [facility];  Service: General;  Laterality: Right;  . BREAST SURGERY  1985   rt br bx  . COLONOSCOPY  03/2014   poylps  . CYSTOSCOPY N/A 01/22/2017   Procedure: CYSTOSCOPY;  Surgeon: Bobbye Charleston, MD;  Location: [facility];  Service: Gynecology;  Laterality: N/A;  . ROBOTIC ASSISTED TOTAL HYSTERECTOMY WITH BILATERAL SALPINGO  OOPHERECTOMY Bilateral 01/22/2017   Procedure: ROBOTIC ASSISTED TOTAL HYSTERECTOMY WITH BILATERAL SALPINGECTOMY AND LEFT OOPHORECTOMY;  Surgeon: Bobbye Charleston, MD;  Location: [facility];  Service: Gynecology;  Laterality: Bilateral;     ALLERGIES:  Allergies  Allergen Reactions  . Aspirin Hives    Pt is able to take ibuprofen without problems  . Codeine Other (See Comments)    "confusion"     CURRENT MEDICATIONS:  Outpatient  Encounter Medications as of 01/01/2019  Medication Sig  . ASPIRIN ADULT LOW STRENGTH 81 MG EC tablet TAKE 1 TABLET (81 MG TOTAL) BY MOUTH DAILY.  Marland Kitchen levocetirizine (XYZAL) 5 MG tablet Take 1 tablet (5 mg total) by mouth every evening.  . meloxicam (MOBIC) 15 MG tablet Take 1 tablet (15 mg total) by mouth daily.  . methocarbamol (ROBAXIN) 500 MG tablet Take 1 tablet (500 mg total) by mouth every 6 (six) hours as needed for muscle spasms.  . potassium chloride SA (K-DUR,KLOR-CON) 20 MEQ tablet TAKE 1 TABLET BY MOUTH 3 TIMES DAILY  . rosuvastatin (CRESTOR) 5 MG tablet TAKE 1 TABLET (5 MG TOTAL) BY MOUTH DAILY.  Marland Kitchen triamcinolone (NASACORT) 55 MCG/ACT AERO nasal inhaler Place 2 sprays into the nose daily.  . valsartan-hydrochlorothiazide (DIOVAN-HCT) 160-25 MG tablet Take 1 tablet by mouth daily.  . fluocinonide cream (LIDEX) 5.80 % Apply 1 application topically 2 (two) times daily. (Patient not taking: Reported on 01/01/2019)   No facility-administered encounter medications on file as of 01/01/2019.      ONCOLOGIC FAMILY HISTORY:  Family History  Problem Relation Age of Onset  . Lung cancer Father 57       smoker  . Cancer Father        lung  . Hypertension Other   . Alcohol abuse Neg Hx   . Diabetes Neg Hx   . Early death Neg Hx   . Hearing loss Neg Hx   . Heart disease Neg Hx   . Hyperlipidemia Neg Hx   . Stroke Neg Hx   . Kidney disease Neg Hx   . Colon cancer Neg Hx   . Breast cancer Neg Hx     GENETIC COUNSELING/TESTING: Not at this time  SOCIAL HISTORY:  Social History   Socioeconomic History  . Marital status: Single    Spouse name: Not on file  . Number of children: Not on file  . Years of education: Not on file  . Highest education level: Not on file  Occupational History    Employer: UNEMPLOYED    Comment: [employer]  . Financial resource strain: Not on file  . Food insecurity    Worry: Not on file    Inability: Not on file  . Transportation  needs    Medical: Not on file    Non-medical: Not on file  Tobacco Use  . Smoking status: Never Smoker  . Smokeless tobacco: Never Used  Substance and Sexual Activity  . Alcohol use: No  . Drug use: No  . Sexual activity: Not Currently    Birth control/protection: None  Lifestyle  . Physical activity    Days per week: Not on file    Minutes per session: Not on file  . Stress: Not on file  Relationships  . Social Herbalist on phone: Not on file    Gets together: Not on file    Attends religious service: Not on file    Active member of club or organization: Not on  file    Attends meetings of clubs or organizations: Not on file    Relationship status: Not on file  . Intimate partner violence    Fear of current or ex partner: Not on file    Emotionally abused: Not on file    Physically abused: Not on file    Forced sexual activity: Not on file  Other Topics Concern  . Not on file  Social History Narrative   Regular exercise-No      PHYSICAL EXAMINATION:  Vital Signs: Vitals:   01/01/19 1118  BP: 123/72  Pulse: 64  Resp: 18  Temp: 97.8 F (36.6 C)  SpO2: 100%   Filed Weights   01/01/19 1118  Weight: 153 lb 9.6 oz (69.7 kg)   General: Well-nourished, well-appearing female in no acute distress.  Unaccompanied today.   HEENT: Head is normocephalic.  Pupils equal and reactive to light. Conjunctivae clear without exudate.  Sclerae anicteric. Oral mucosa is pink, moist.  Oropharynx is pink without lesions or erythema. thyromegaly Lymph: No cervical, supraclavicular, or infraclavicular lymphadenopathy noted on palpation.  Cardiovascular: Regular rate and rhythm.Marland Kitchen Respiratory: Clear to auscultation bilaterally. Chest expansion symmetric; breathing non-labored.  Breast Exam:  -Left breast: No appreciable masses on palpation. No skin redness, thickening, or peau d'orange appearance; no nipple retraction or nipple discharge;  -Right breast: No appreciable masses  on palpation. No skin redness, thickening, or peau d'orange appearance; no nipple retraction or nipple discharge; mild distortion in symmetry at previous lumpectomy site well healed scar without erythema or nodularity. -Axilla: No axillary adenopathy bilaterally.  GI: Abdomen soft and round; non-tender, non-distended. Bowel sounds normoactive. No hepatosplenomegaly.   GU: Deferred.  Neuro: No focal deficits. Steady gait.  Psych: Mood and affect normal and appropriate for situation.  MSK: No focal spinal tenderness to palpation, full range of motion in bilateral upper extremities Extremities: No edema. Skin: Warm and dry.  LABORATORY DATA:  None for this visit   DIAGNOSTIC IMAGING:  Most recent mammogram:     ASSESSMENT AND PLAN:  Ms.. Brooke Wood is a pleasant 53 y.o. 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   [facility] and routine follow-up.   1. History of breast cancer:  Ms. Kreager is currently clinically and radiographically without evidence of disease or recurrence of breast cancer. She will be due for mammogram in 05/2019; orders placed today.  She will return in one year for LTS follow up.  I encouraged her to call me with any questions or concerns before her next visit at the cancer center, and I would be happy to see her sooner, if needed.    2. Bone health:  Given Ms. Ferris's age, history of breast cancer, she is at risk for bone demineralization.  I counseled her that the tamoxifen she previously took does have a protective effect on the bone.   She was given education on specific food and activities to promote bone health.  She should f/u with her PCP regarding bone health and bone density management.  3. Cancer screening:  Due to Ms.  Rosser's history and her age, she should receive screening for skin cancers, colon cancer, and gynecologic cancers. She was encouraged to follow-up with her PCP for appropriate cancer screenings.   4. Health maintenance and wellness promotion: Ms. Alpert was encouraged to  consume 5-7 servings of fruits and vegetables per day. She was also encouraged to engage in moderate to vigorous exercise for 30 minutes per day most days of the week. She was instructed to limit her alcohol consumption and continue to abstain from tobacco use.   Dispo:  -Return to cancer center in one year for LTS followup -mammogram in 05/2019   A total of (20) minutes of face-to-face time was spent with this patient with greater than 50% of that time in counseling and care-coordination.   Gardenia Phlegm, [place] [phone]   Note: PRIMARY CARE PROVIDER [name], [place] [phone]

## 2019-01-01 NOTE — Patient Instructions (Signed)
You are doing well today and have no sign of cancer recurrence.  I recommend that you ask your PCP to examine your thyroid when you see him again.  You can use Toms deodorant.  Rub vitamin E oil on your breast and that can help with any scar tissue.    We can see you back next year for f/u.

## 2019-01-11 MED FILL — VALSARTAN-HCTZ 160-25 MG TA: 160-25 | 30 days supply | Qty: 30 | Fill #1

## 2019-01-31 ENCOUNTER — Encounter: Payer: Self-pay | Admitting: Internal Medicine

## 2019-02-08 ENCOUNTER — Other Ambulatory Visit: Payer: Self-pay

## 2019-02-08 ENCOUNTER — Other Ambulatory Visit (INDEPENDENT_AMBULATORY_CARE_PROVIDER_SITE_OTHER): Payer: BC Managed Care – PPO

## 2019-02-08 ENCOUNTER — Ambulatory Visit (INDEPENDENT_AMBULATORY_CARE_PROVIDER_SITE_OTHER): Payer: BC Managed Care – PPO | Admitting: Internal Medicine

## 2019-02-08 ENCOUNTER — Encounter: Payer: Self-pay | Admitting: Internal Medicine

## 2019-02-08 VITALS — BP 124/76 | HR 95 | Temp 98.1°F | Resp 16 | Ht 66.0 in | Wt 153.0 lb

## 2019-02-08 DIAGNOSIS — Z1211 Encounter for screening for malignant neoplasm of colon: Secondary | ICD-10-CM | POA: Insufficient documentation

## 2019-02-08 DIAGNOSIS — Z0000 Encounter for general adult medical examination without abnormal findings: Secondary | ICD-10-CM

## 2019-02-08 DIAGNOSIS — Z23 Encounter for immunization: Secondary | ICD-10-CM

## 2019-02-08 DIAGNOSIS — I10 Essential (primary) hypertension: Secondary | ICD-10-CM

## 2019-02-08 DIAGNOSIS — E118 Type 2 diabetes mellitus with unspecified complications: Secondary | ICD-10-CM

## 2019-02-08 DIAGNOSIS — E785 Hyperlipidemia, unspecified: Secondary | ICD-10-CM | POA: Diagnosis not present

## 2019-02-08 DIAGNOSIS — R7303 Prediabetes: Secondary | ICD-10-CM | POA: Diagnosis not present

## 2019-02-08 LAB — COMPREHENSIVE METABOLIC PANEL
ALT: 17 U/L (ref 0–35)
AST: 12 U/L (ref 0–37)
Albumin: 4.7 g/dL (ref 3.5–5.2)
Alkaline Phosphatase: 150 U/L — ABNORMAL HIGH (ref 39–117)
BUN: 21 mg/dL (ref 6–23)
CO2: 31 mEq/L (ref 19–32)
Calcium: 10 mg/dL (ref 8.4–10.5)
Chloride: 100 mEq/L (ref 96–112)
Creatinine, Ser: 0.8 mg/dL (ref 0.40–1.20)
GFR: 89.87 mL/min (ref >60.00)
Glucose, Bld: 94 mg/dL (ref 70–99)
Potassium: 3.5 mEq/L (ref 3.5–5.1)
Sodium: 139 mEq/L (ref 135–145)
Total Bilirubin: 0.4 mg/dL (ref 0.2–1.2)
Total Protein: 7.5 g/dL (ref 6.0–8.3)

## 2019-02-08 LAB — BASIC METABOLIC PANEL
BUN: 21 mg/dL (ref 6–23)
CO2: 31 mEq/L (ref 19–32)
Calcium: 10 mg/dL (ref 8.4–10.5)
Chloride: 100 mEq/L (ref 96–112)
Creatinine, Ser: 0.8 mg/dL (ref 0.40–1.20)
GFR: 89.87 mL/min (ref >60.00)
Glucose, Bld: 94 mg/dL (ref 70–99)
Potassium: 3.5 mEq/L (ref 3.5–5.1)
Sodium: 139 mEq/L (ref 135–145)

## 2019-02-08 LAB — HEMOGLOBIN A1C: Hgb A1c MFr Bld: 6.2 % (ref 4.6–6.5)

## 2019-02-08 MED FILL — VALSARTAN-HCTZ 160-25 MG TA: 160-25 | 30 days supply | Qty: 30 | Fill #2

## 2019-02-08 NOTE — Patient Instructions (Signed)

## 2019-02-08 NOTE — Progress Notes (Signed)
Subjective:  Patient ID: Brooke Wood, female    DOB: September 02, 1965  Age: 53 y.o. MRN: [id number]  CC: Hypertension and Diabetes   HPI Brooke Wood presents for f/up - She tells me she recently saw a provider in oncology and was told that she had an enlarged thyroid gland.  She tells me her thyroid gland does not bother her.  She feels well today and offers no complaints.  Outpatient Medications Prior to Visit  Medication Sig Dispense Refill  . ASPIRIN ADULT LOW STRENGTH 81 MG EC tablet TAKE 1 TABLET (81 MG TOTAL) BY MOUTH DAILY. 90 tablet 1  . triamcinolone (NASACORT) 55 MCG/ACT AERO nasal inhaler Place 2 sprays into the nose daily. 32.4 mL 3  . fluocinonide cream (LIDEX) [id number] % Apply 1 application topically 2 (two) times daily. (Patient not taking: Reported on 01/01/2019) 60 g 2  . levocetirizine (XYZAL) 5 MG tablet Take 1 tablet (5 mg total) by mouth every evening. (Patient not taking: Reported on 02/08/2019) 90 tablet 1  . meloxicam (MOBIC) 15 MG tablet Take 1 tablet (15 mg total) by mouth daily. (Patient not taking: Reported on 02/08/2019) 90 tablet 0  . methocarbamol (ROBAXIN) 500 MG tablet Take 1 tablet (500 mg total) by mouth every 6 (six) hours as needed for muscle spasms. (Patient not taking: Reported on 02/08/2019) 60 tablet 0  . potassium chloride SA (K-DUR,KLOR-CON) 20 MEQ tablet TAKE 1 TABLET BY MOUTH 3 TIMES DAILY (Patient not taking: Reported on 02/08/2019) 90 tablet 3  . rosuvastatin (CRESTOR) 5 MG tablet TAKE 1 TABLET (5 MG TOTAL) BY MOUTH DAILY. (Patient not taking: Reported on 02/08/2019) 90 tablet 1  . valsartan-hydrochlorothiazide (DIOVAN-HCT) 160-25 MG tablet Take 1 tablet by mouth daily. (Patient not taking: Reported on 02/08/2019) 90 tablet 1   No facility-administered medications prior to visit.     ROS Review of Systems  Constitutional: Negative for diaphoresis, fatigue and unexpected weight change.  HENT: Negative.   Eyes: Negative.   Respiratory: Negative for  cough, chest tightness, shortness of breath and wheezing.   Cardiovascular: Negative for chest pain, palpitations and leg swelling.  Gastrointestinal: Negative for abdominal pain, constipation and diarrhea.  Endocrine: Negative.   Genitourinary: Negative for difficulty urinating.  Musculoskeletal: Negative.  Negative for arthralgias and myalgias.  Skin: Negative.   Neurological: Negative for dizziness, weakness, light-headedness, numbness and headaches.  Hematological: Negative for adenopathy. Does not bruise/bleed easily.  Psychiatric/Behavioral: Negative.     Objective:  BP 124/76 (BP Location: Left Arm, Patient Position: Sitting, Cuff Size: Normal)   Pulse 95   Temp 98.1 F (36.7 C) (Oral)   Resp 16   Ht 5\' 6"  (1.676 m)   Wt 153 lb (69.4 kg)   LMP 01/07/2016 (Approximate)   SpO2 97%   BMI 24.69 kg/m   BP Readings from Last 3 Encounters:  02/08/19 124/76  01/01/19 123/72  12/02/18 110/74    Wt Readings from Last 3 Encounters:  02/08/19 153 lb (69.4 kg)  01/01/19 153 lb 9.6 oz (69.7 kg)  12/02/18 152 lb 12 oz (69.3 kg)    Physical Exam Vitals signs reviewed.  Constitutional:      Appearance: Normal appearance.  HENT:     Nose: Nose normal.     Mouth/Throat:     Mouth: Mucous membranes are moist.  Eyes:     General: No scleral icterus.    Conjunctiva/sclera: Conjunctivae normal.  Neck:     Musculoskeletal: Normal range of motion and neck  supple. No edema or muscular tenderness.     Thyroid: No thyroid mass, thyromegaly or thyroid tenderness.  Cardiovascular:     Rate and Rhythm: Normal rate and regular rhythm.     Heart sounds: No murmur.  Pulmonary:     Effort: Pulmonary effort is normal.     Breath sounds: No stridor. No wheezing, rhonchi or rales.  Abdominal:     General: Abdomen is protuberant. Bowel sounds are normal. There is no distension.     Palpations: Abdomen is soft. There is no hepatomegaly or splenomegaly.     Tenderness: There is no abdominal  tenderness.  Musculoskeletal: Normal range of motion.     Right lower leg: No edema.     Left lower leg: No edema.  Lymphadenopathy:     Cervical: No cervical adenopathy.     Right cervical: No superficial, deep or posterior cervical adenopathy.    Left cervical: No superficial, deep or posterior cervical adenopathy.  Skin:    General: Skin is warm and dry.     Coloration: Skin is not pale.  Neurological:     General: No focal deficit present.     Mental Status: She is alert.  Psychiatric:        Mood and Affect: Mood normal.        Behavior: Behavior normal.     Lab Results  Component Value Date   WBC 7.8 07/02/2018   HGB 14.7 07/02/2018   HCT 43.9 07/02/2018   PLT 233.0 07/02/2018   GLUCOSE 94 02/08/2019   GLUCOSE 94 02/08/2019   CHOL 165 07/02/2018   TRIG 136.0 07/02/2018   HDL 55.20 07/02/2018   LDLCALC 82 07/02/2018   ALT 17 02/08/2019   AST 12 02/08/2019   NA 139 02/08/2019   NA 139 02/08/2019   K 3.5 02/08/2019   K 3.5 02/08/2019   CL 100 02/08/2019   CL 100 02/08/2019   CREATININE 0.80 02/08/2019   CREATININE 0.80 02/08/2019   BUN 21 02/08/2019   BUN 21 02/08/2019   CO2 31 02/08/2019   CO2 31 02/08/2019   TSH 1.40 07/02/2018   HGBA1C 6.2 02/08/2019   MICROALBUR <0.7 10/05/2018    Dg Hip Unilat With Pelvis 2-3 Views Left  Result Date: 08/12/2018 CLINICAL DATA:  Left side hip pain.  No known injury. EXAM: DG HIP (WITH OR WITHOUT PELVIS) 2-3V LEFT COMPARISON:  06/27/2016 FINDINGS: There is no evidence of hip fracture or dislocation. There is no evidence of arthropathy or other focal bone abnormality. IMPRESSION: Negative. Electronically Signed   By: Rolm Baptise M.D.   On: 08/12/2018 09:02    Assessment & Plan:   [name] was seen today for hypertension and diabetes.  Diagnoses and all orders for this visit:  Essential hypertension, benign- Her blood pressure is adequately well controlled.  Electrolytes and renal function are normal.  Her thyroid gland  does not feel enlarged today. -     Basic metabolic panel; Future  Type II diabetes mellitus with manifestations ([place])- Her blood sugars are adequately well controlled. -     Basic metabolic panel; Future -     Hemoglobin A1c; Future  Colon cancer screening -     Ambulatory referral to Gastroenterology  Need for pneumococcal vaccine -     Pneumococcal polysaccharide vaccine 23-valent greater than or equal to 2yo subcutaneous/IM   I am having Brooke Wood maintain her potassium chloride SA, levocetirizine, triamcinolone, meloxicam, methocarbamol, fluocinonide cream, valsartan-hydrochlorothiazide, Aspirin Adult Low  Strength, and rosuvastatin.  No orders of the defined types were placed in this encounter.    Follow-up: Return in about 6 months (around 08/08/2019).  [name], MD

## 2019-02-19 ENCOUNTER — Encounter: Payer: Self-pay | Admitting: *Deleted

## 2019-02-19 ENCOUNTER — Encounter: Payer: Self-pay | Admitting: Internal Medicine

## 2019-02-19 ENCOUNTER — Telehealth: Payer: Self-pay | Admitting: *Deleted

## 2019-02-19 DIAGNOSIS — T884XXA Failed or difficult intubation, initial encounter: Secondary | ICD-10-CM | POA: Insufficient documentation

## 2019-02-19 DIAGNOSIS — Z86010 Personal history of colonic polyps: Secondary | ICD-10-CM

## 2019-02-19 DIAGNOSIS — C50919 Malignant neoplasm of unspecified site of unspecified female breast: Secondary | ICD-10-CM | POA: Insufficient documentation

## 2019-02-19 DIAGNOSIS — Z9189 Other specified personal risk factors, not elsewhere classified: Secondary | ICD-10-CM | POA: Insufficient documentation

## 2019-02-19 HISTORY — DX: Failed or difficult intubation, initial encounter: T88.4XXA

## 2019-02-19 NOTE — Telephone Encounter (Signed)
Dr Carlean Purl,  Due to difficult intubation at 2018 surgery, Do you want Brooke Wood to have an OV prior to her colon or direct at [facility]?  Last colon 02-28-14 with you, TA polyps x 2   Please advise, Thanks for your time, Brooke Wood

## 2019-02-19 NOTE — Telephone Encounter (Signed)
Brooke Wood,  Yes she needs to go to the hospital.  The change in airway is possible due to her goiter.  Thanks,  Osvaldo Angst

## 2019-02-19 NOTE — Telephone Encounter (Signed)
PV AND COLON 2020 CANCELED  RECALL CHANGED TO 11-04/2020 PER DR Carlean Purl [id number] CALLED PT- LMTRC TO MARIE IN [facility]

## 2019-02-19 NOTE — Telephone Encounter (Signed)
Please call her and explain that I was going to send her a letter that she does not need a recall colon until 01/2021 based upon the new guidelines - I am working on Nov recalls now SO   We can change recall to 01/2021 - cancel previsit  Let me know if she has ?'s

## 2019-02-19 NOTE — Telephone Encounter (Signed)
Pt returned your call. I gave her Dr. Celesta Aver message, she is fine waiting until 2022 for repeat colon.

## 2019-02-19 NOTE — Telephone Encounter (Signed)
John,  Can you please review this patients anesthesia report from her 2014 and 2018 procedures- 2014 no issues with intubation, attempt x 1.  Her 2018 anesthesia report states attempts x 3, unanticipated difficulty due to anterior larynx.  I think she will need hospital case but can you review and clarify and let me knw.  Thanks, Lelan Pons

## 2019-03-10 MED FILL — ROSUVASTATIN CALCIUM 5 MG T: 5 | 90 days supply | Qty: 90 | Fill #1

## 2019-03-10 MED FILL — ASPIRIN 81 MG TBEC: 81 | 90 days supply | Qty: 90 | Fill #1

## 2019-03-10 MED FILL — VALSARTAN-HCTZ 160-25 MG TA: 160-25 | 30 days supply | Qty: 30 | Fill #3

## 2019-03-17 ENCOUNTER — Encounter: Payer: BC Managed Care – PPO | Admitting: Internal Medicine

## 2019-03-29 ENCOUNTER — Encounter: Payer: Self-pay | Admitting: Internal Medicine

## 2019-04-05 ENCOUNTER — Other Ambulatory Visit: Payer: Self-pay | Admitting: Obstetrics and Gynecology

## 2019-04-05 DIAGNOSIS — Z1231 Encounter for screening mammogram for malignant neoplasm of breast: Secondary | ICD-10-CM

## 2019-04-07 ENCOUNTER — Ambulatory Visit (INDEPENDENT_AMBULATORY_CARE_PROVIDER_SITE_OTHER): Payer: Self-pay | Admitting: Internal Medicine

## 2019-04-07 ENCOUNTER — Encounter: Payer: Self-pay | Admitting: Internal Medicine

## 2019-04-07 ENCOUNTER — Other Ambulatory Visit: Payer: Self-pay

## 2019-04-07 VITALS — BP 120/78 | HR 90 | Temp 98.4°F | Resp 16 | Ht 66.0 in | Wt 156.1 lb

## 2019-04-07 DIAGNOSIS — L309 Dermatitis, unspecified: Secondary | ICD-10-CM

## 2019-04-07 DIAGNOSIS — Z114 Encounter for screening for human immunodeficiency virus [HIV]: Secondary | ICD-10-CM

## 2019-04-07 MED ORDER — DIMETHICONE 1 % EX LOTN: 1.0000 | TOPICAL_LOTION | Freq: Two times a day (BID) | CUTANEOUS | 2 refills | Status: DC

## 2019-04-07 NOTE — Patient Instructions (Signed)
Dyshidrotic Eczema Dyshidrotic eczema (pompholyx) is a type of eczema that causes very itchy (pruritic), fluid-filled blisters (vesicles) to form on the hands and feet. It can affect people of any age, but is more common before the age of 40. There is no cure, but treatment and certain lifestyle changes can help relieve symptoms. What are the causes? The cause of this condition is not known. What increases the risk? You are more likely to develop this condition if:  You wash your hands frequently.  You have a personal history or family history of eczema, allergies, asthma, or hay fever.  You are allergic to metals such as nickel or cobalt.  You work with cement.  You smoke. What are the signs or symptoms? Symptoms of this condition may affect the hands, feet, or both. Symptoms may come and go (recur), and may include:  Severe itching, which may happen before blisters appear.  Blisters. These may form suddenly. ? In the early stages, blisters may form near the fingertips. ? In severe cases, blisters may grow to large blister masses (bullae). ? Blisters resolve in 2-3 weeks without bursting. This is followed by a dry phase in which itching eases.  Pain and swelling.  Cracks or long, narrow openings (fissures) in the skin.  Severe dryness.  Ridges on the nails. How is this diagnosed? This condition may be diagnosed based on:  A physical exam.  Your symptoms.  Your medical history.  Skin scrapings to rule out a fungal infection.  Testing a swab of fluid for bacteria (culture).  Removing and checking a small piece of skin (biopsy) in order to test for infection or to rule out other conditions.  Skin patch tests. These tests involve taking patches that contain possible allergens and placing them on your back. Your health care provider will wait a few days and then check to see if an allergic reaction occurred. These tests may be done if your health care provider suspects  allergic reactions, or to rule out other types of eczema. You may be referred to a health care provider who specializes in the skin (dermatologist) to help diagnose and treat this condition. How is this treated? There is no cure for this condition, but treatment can help relieve symptoms. Depending on how many blisters you have and how severe they are, your health care provider may suggest:  Avoiding allergens, irritants, or triggers that worsen symptoms. This may involve lifestyle changes such as: ? Using different lotions or soaps. ? Avoiding hot weather or places that will cause you to sweat a lot. ? Managing stress with coping techniques such as relaxation and exercise, and asking for help when you need it. ? Diet changes as recommended by your health care provider.  Using a clean, damp towel (cool compress) to relieve symptoms.  Soaking in a bath that contains a type of salt that relieves irritation (aluminum acetate soaks).  Medicine taken by mouth to reduce itching (oral antihistamines).  Medicine applied to the skin to reduce swelling and irritation (topical corticosteroids).  Medicine that reduces the activity of the body's disease-fighting system (immunosuppressants) to treat inflammation. This may be given in severe cases.  Antibiotic medicines to treat bacterial infection.  Light therapy (phototherapy). This involves shining ultraviolet (UV) light on affected skin in order to reduce itchiness and inflammation. Follow these instructions at home: Bathing and skin care   Wash skin gently. After bathing or washing your hands, pat your skin dry. Avoid rubbing your skin.  Remove   all jewelry before bathing. If the skin under the jewelry stays wet, blisters may form or get worse.  Apply cool compresses as told by your health care provider: ? Soak a clean towel in cool water. ? Wring out excess water until towel is damp. ? Place the towel over affected skin. Leave the towel on  for 20 minutes at a time, 2-3 times a day.  Use mild soaps, cleansers, and lotions that do not contain dyes, perfumes, or other irritants.  Keep your skin hydrated. To do this: ? Avoid very hot water. Take lukewarm baths or showers. ? Apply moisturizer within three minutes of bathing. This locks in moisture. Medicines  Take and apply over-the-counter and prescription medicines only as told by your health care provider.  If you were prescribed antibiotic medicine, take or apply it as told by your health care provider. Do not stop using the antibiotic even if you start to feel better. General instructions  Identify and avoid triggers and allergens.  Keep fingernails short to avoid breaking open the skin while scratching.  Use waterproof gloves to protect your hands when doing work that keeps your hands wet for a long time.  Wear socks to keep your feet dry.  Do not use any products that contain nicotine or tobacco, such as cigarettes and e-cigarettes. If you need help quitting, ask your health care provider.  Keep all follow-up visits as told by your health care provider. This is important. Contact a health care provider if:  You have symptoms that do not go away.  You have signs of infection, such as: ? Crusting, pus, or a bad smell. ? More redness, swelling, or pain. ? Increased warmth in the affected area. Summary  Dyshidrotic eczema (pompholyx) is a type of eczema that causes very itchy (pruritic), fluid-filled blisters (vesicles) to form on the hands and feet.  The cause of this condition is not known.  There is no cure for this condition, but treatment can help relieve symptoms. Treatment depends on how many blisters you have and how severe they are.  Use mild soaps, cleansers, and lotions that do not contain dyes, perfumes, or other irritants. Keep your skin hydrated. This information is not intended to replace advice given to you by your health care provider. Make sure  you discuss any questions you have with your health care provider. Document Revised: 07/08/2018 Document Reviewed: 08/01/2016 Elsevier Patient Education  2020 [employer]  

## 2019-04-07 NOTE — Progress Notes (Signed)
Subjective:  Patient ID: Brooke Wood, female    DOB: 03/29/1966  Age: 54 y.o. MRN: [id number]  CC: Rash   This visit occurred during the SARS-CoV-2 public health emergency.  Safety protocols were in place, including screening questions prior to the visit, additional usage of staff PPE, and extensive cleaning of exam room while observing appropriate contact time as indicated for disinfecting solutions.    HPI [name] presents for f/up - She complains of a rash on the back of both hands.  She has been applying Vaseline without much improvement in her symptoms.  Outpatient Medications Prior to Visit  Medication Sig Dispense Refill  . ASPIRIN ADULT LOW STRENGTH 81 MG EC tablet TAKE 1 TABLET (81 MG TOTAL) BY MOUTH DAILY. 90 tablet 1  . fluocinonide cream (LIDEX) [id number] % Apply 1 application topically 2 (two) times daily. 60 g 2  . levocetirizine (XYZAL) 5 MG tablet Take 1 tablet (5 mg total) by mouth every evening. 90 tablet 1  . meloxicam (MOBIC) 15 MG tablet Take 1 tablet (15 mg total) by mouth daily. 90 tablet 0  . methocarbamol (ROBAXIN) 500 MG tablet Take 1 tablet (500 mg total) by mouth every 6 (six) hours as needed for muscle spasms. 60 tablet 0  . potassium chloride SA (K-DUR,KLOR-CON) 20 MEQ tablet TAKE 1 TABLET BY MOUTH 3 TIMES DAILY 90 tablet 3  . rosuvastatin (CRESTOR) 5 MG tablet TAKE 1 TABLET (5 MG TOTAL) BY MOUTH DAILY. 90 tablet 1  . triamcinolone (NASACORT) 55 MCG/ACT AERO nasal inhaler Place 2 sprays into the nose daily. 32.4 mL 3  . valsartan-hydrochlorothiazide (DIOVAN-HCT) 160-25 MG tablet Take 1 tablet by mouth daily. 90 tablet 1   No facility-administered medications prior to visit.    ROS Review of Systems  Skin: Positive for rash.  All other systems reviewed and are negative.   Objective:  BP 120/78 (BP Location: Left Arm, Patient Position: Sitting, Cuff Size: Large)   Pulse 90   Temp 98.4 F (36.9 C) (Oral)   Ht 5\' 6"  (1.676 m)   Wt 156 lb 2  oz (70.8 kg)   LMP 01/07/2016 (Approximate)   SpO2 98%   BMI 25.20 kg/m   BP Readings from Last 3 Encounters:  04/07/19 120/78  02/08/19 124/76  01/01/19 123/72    Wt Readings from Last 3 Encounters:  04/07/19 156 lb 2 oz (70.8 kg)  02/08/19 153 lb (69.4 kg)  01/01/19 153 lb 9.6 oz (69.7 kg)    Physical Exam Skin:    Comments: Over the dorsal surfaces of both hands, over the MCP joints, there are large areas of hyperpigmentation/skin thickening/cracking/fissuring/peeling.  See photo.     Lab Results  Component Value Date   WBC 7.8 07/02/2018   HGB 14.7 07/02/2018   HCT 43.9 07/02/2018   PLT 233.0 07/02/2018   GLUCOSE 94 02/08/2019   GLUCOSE 94 02/08/2019   CHOL 165 07/02/2018   TRIG 136.0 07/02/2018   HDL 55.20 07/02/2018   LDLCALC 82 07/02/2018   ALT 17 02/08/2019   AST 12 02/08/2019   NA 139 02/08/2019   NA 139 02/08/2019   K 3.5 02/08/2019   K 3.5 02/08/2019   CL 100 02/08/2019   CL 100 02/08/2019   CREATININE 0.80 02/08/2019   CREATININE 0.80 02/08/2019   BUN 21 02/08/2019   BUN 21 02/08/2019   CO2 31 02/08/2019   CO2 31 02/08/2019   TSH 1.40 07/02/2018  HGBA1C 6.2 02/08/2019   MICROALBUR <0.7 10/05/2018    DG HIP UNILAT WITH PELVIS 2-3 VIEWS LEFT  Result Date: 08/12/2018 CLINICAL DATA:  Left side hip pain.  No known injury. EXAM: DG HIP (WITH OR WITHOUT PELVIS) 2-3V LEFT COMPARISON:  06/27/2016 FINDINGS: There is no evidence of hip fracture or dislocation. There is no evidence of arthropathy or other focal bone abnormality. IMPRESSION: Negative. Electronically Signed   By: Rolm Baptise M.D.   On: 08/12/2018 09:02    Assessment & Plan:   Tenicia was seen today for rash.  Diagnoses and all orders for this visit:  Screening for HIV (human immunodeficiency virus) -     HIV antibody (Reflex)  Chronic eczema of hand- I am concerned that the Vaseline may be extracting moisture from her skin and making this worse.  I have asked her to stop using  Vaseline.  If she has not been using the steroid cream I have asked her to restart it.  I have also asked her to start using topical dimethicone as a barrier and to see dermatology. -     Ambulatory referral to Dermatology -     DIMETHICONE, TOPICAL, 1 % LOTN; Apply 1 Act topically 2 (two) times daily.   I am having Brooke Wood start on DIMETHICONE (TOPICAL). I am also having her maintain her potassium chloride SA, levocetirizine, triamcinolone, meloxicam, methocarbamol, fluocinonide cream, valsartan-hydrochlorothiazide, Aspirin Adult Low Strength, and rosuvastatin.  Meds ordered this encounter  Medications  . DIMETHICONE, TOPICAL, 1 % LOTN    Sig: Apply 1 Act topically 2 (two) times daily.    Dispense:  180 g    Refill:  2     Follow-up: Return in about 3 months (around 07/06/2019).  [name], MD

## 2019-04-12 MED FILL — VALSARTAN-HCTZ 160-25 MG TA: 160-25 | 30 days supply | Qty: 30 | Fill #4

## 2019-04-26 MED FILL — BETAMETHASONE DP 0.05% OINT: 0.05 | 30 days supply | Qty: 45 | Fill #0

## 2019-05-03 ENCOUNTER — Encounter: Payer: Self-pay | Admitting: Genetic Counselor

## 2019-05-03 DIAGNOSIS — Z1379 Encounter for other screening for genetic and chromosomal anomalies: Secondary | ICD-10-CM | POA: Insufficient documentation

## 2019-05-10 ENCOUNTER — Telehealth: Payer: Self-pay | Admitting: *Deleted

## 2019-05-12 MED FILL — BETAMETHASONE DP 0.05% OINT: 0.05 | 30 days supply | Qty: 45 | Fill #0

## 2019-05-12 MED FILL — VALSARTAN-HCTZ 160-25 MG TA: 160-25 | 30 days supply | Qty: 30 | Fill #5

## 2019-05-14 NOTE — Telephone Encounter (Signed)
No entry 

## 2019-05-17 ENCOUNTER — Other Ambulatory Visit: Payer: Self-pay

## 2019-05-17 ENCOUNTER — Ambulatory Visit
Admission: RE | Admit: 2019-05-17 | Discharge: 2019-05-17 | Disposition: A | Discharge status: Home / Self Care | Payer: BC Managed Care – PPO | Source: Ambulatory Visit | Attending: Obstetrics and Gynecology | Admitting: Obstetrics and Gynecology

## 2019-05-17 DIAGNOSIS — Z1231 Encounter for screening mammogram for malignant neoplasm of breast: Secondary | ICD-10-CM

## 2019-06-09 ENCOUNTER — Other Ambulatory Visit: Payer: Self-pay | Admitting: Internal Medicine

## 2019-06-09 DIAGNOSIS — I10 Essential (primary) hypertension: Secondary | ICD-10-CM

## 2019-06-09 MED FILL — VALSARTAN-HCTZ 160-25 MG TA: 160-25 | 30 days supply | Qty: 30 | Fill #0

## 2019-06-21 ENCOUNTER — Other Ambulatory Visit: Payer: Self-pay | Admitting: Internal Medicine

## 2019-06-21 DIAGNOSIS — E785 Hyperlipidemia, unspecified: Secondary | ICD-10-CM

## 2019-06-21 MED FILL — ASPIRIN 81 MG TBEC: 81 | 90 days supply | Qty: 90 | Fill #0

## 2019-06-21 MED FILL — ROSUVASTATIN CALCIUM 5 MG T: 5 | 30 days supply | Qty: 30 | Fill #0

## 2019-07-12 ENCOUNTER — Telehealth: Payer: Self-pay | Admitting: Internal Medicine

## 2019-07-12 NOTE — Telephone Encounter (Signed)
   Patient calling to report 1 episode today of loose stool, blood on tissue. Declined to schedule appointment, requesting call from [facility]

## 2019-07-12 NOTE — Telephone Encounter (Signed)
Per PCP - if pt did not want to come in and the amount was only a drop she can keep an eye on it.   Informed pt that if it increases in amount or if it continues she will need to come in.   Pt stated understanding.

## 2019-07-16 MED FILL — VALSARTAN-HCTZ 160-25 MG TA: 160-25 | 30 days supply | Qty: 30 | Fill #1

## 2019-07-16 MED FILL — ROSUVASTATIN CALCIUM 5 MG T: 5 | 30 days supply | Qty: 30 | Fill #1

## 2019-08-09 ENCOUNTER — Ambulatory Visit: Payer: BC Managed Care – PPO | Admitting: Internal Medicine

## 2019-08-16 ENCOUNTER — Encounter: Payer: Self-pay | Admitting: Internal Medicine

## 2019-08-16 ENCOUNTER — Other Ambulatory Visit: Payer: Self-pay

## 2019-08-16 ENCOUNTER — Ambulatory Visit (INDEPENDENT_AMBULATORY_CARE_PROVIDER_SITE_OTHER): Payer: BC Managed Care – PPO | Admitting: Internal Medicine

## 2019-08-16 VITALS — BP 136/76 | HR 70 | Temp 98.2°F | Resp 16 | Ht 66.0 in | Wt 161.0 lb

## 2019-08-16 DIAGNOSIS — E785 Hyperlipidemia, unspecified: Secondary | ICD-10-CM

## 2019-08-16 DIAGNOSIS — Z0000 Encounter for general adult medical examination without abnormal findings: Secondary | ICD-10-CM | POA: Diagnosis not present

## 2019-08-16 DIAGNOSIS — I10 Essential (primary) hypertension: Secondary | ICD-10-CM | POA: Diagnosis not present

## 2019-08-16 DIAGNOSIS — E118 Type 2 diabetes mellitus with unspecified complications: Secondary | ICD-10-CM

## 2019-08-16 DIAGNOSIS — Z114 Encounter for screening for human immunodeficiency virus [HIV]: Secondary | ICD-10-CM

## 2019-08-16 DIAGNOSIS — J301 Allergic rhinitis due to pollen: Secondary | ICD-10-CM

## 2019-08-16 DIAGNOSIS — H9193 Unspecified hearing loss, bilateral: Secondary | ICD-10-CM | POA: Diagnosis not present

## 2019-08-16 DIAGNOSIS — Z86010 Personal history of colonic polyps: Secondary | ICD-10-CM

## 2019-08-16 LAB — CBC WITH DIFFERENTIAL/PLATELET
Basophils Absolute: 0 10*3/uL (ref 0.0–0.1)
Basophils Relative: 0.7 % (ref 0.0–3.0)
Eosinophils Absolute: 0.1 10*3/uL (ref 0.0–0.7)
Eosinophils Relative: 0.9 % (ref 0.0–5.0)
HCT: 40.8 % (ref 36.0–46.0)
Hemoglobin: 13.6 g/dL (ref 12.0–15.0)
Lymphocytes Relative: 46 % (ref 12.0–46.0)
Lymphs Abs: 2.8 10*3/uL (ref 0.7–4.0)
MCHC: 33.4 g/dL (ref 30.0–36.0)
MCV: 87 fl (ref 78.0–100.0)
Monocytes Absolute: 0.6 10*3/uL (ref 0.1–1.0)
Monocytes Relative: 10.7 % (ref 3.0–12.0)
Neutro Abs: 2.5 10*3/uL (ref 1.4–7.7)
Neutrophils Relative %: 41.7 % — ABNORMAL LOW (ref 43.0–77.0)
Platelets: 219 10*3/uL (ref 150.0–400.0)
RBC: 4.69 Mil/uL (ref 3.87–5.11)
RDW: 13.9 % (ref 11.5–15.5)
WBC: 6 10*3/uL (ref 4.0–10.5)

## 2019-08-16 LAB — HEPATIC FUNCTION PANEL
ALT: 18 U/L (ref 0–35)
AST: 13 U/L (ref 0–37)
Albumin: 4.6 g/dL (ref 3.5–5.2)
Alkaline Phosphatase: 146 U/L — ABNORMAL HIGH (ref 39–117)
Bilirubin, Direct: 0.1 mg/dL (ref 0.0–0.3)
Total Bilirubin: 0.5 mg/dL (ref 0.2–1.2)
Total Protein: 7.3 g/dL (ref 6.0–8.3)

## 2019-08-16 LAB — BASIC METABOLIC PANEL
BUN: 18 mg/dL (ref 6–23)
CO2: 32 mEq/L (ref 19–32)
Calcium: 10 mg/dL (ref 8.4–10.5)
Chloride: 98 mEq/L (ref 96–112)
Creatinine, Ser: 0.88 mg/dL (ref 0.40–1.20)
GFR: 80.36 mL/min (ref >60.00)
Glucose, Bld: 109 mg/dL — ABNORMAL HIGH (ref 70–99)
Potassium: 3.8 mEq/L (ref 3.5–5.1)
Sodium: 137 mEq/L (ref 135–145)

## 2019-08-16 LAB — URINALYSIS, ROUTINE W REFLEX MICROSCOPIC
Bilirubin Urine: NEGATIVE
Hgb urine dipstick: NEGATIVE
Ketones, ur: NEGATIVE
Leukocytes,Ua: NEGATIVE
Nitrite: NEGATIVE
RBC / HPF: NONE SEEN (ref 0–?)
Specific Gravity, Urine: 1.02 (ref 1.000–1.030)
Total Protein, Urine: NEGATIVE
Urine Glucose: NEGATIVE
Urobilinogen, UA: 0.2 (ref 0.0–1.0)
WBC, UA: NONE SEEN (ref 0–?)
pH: 7 (ref 5.0–8.0)

## 2019-08-16 LAB — MICROALBUMIN / CREATININE URINE RATIO
Creatinine,U: 110.3 mg/dL
Microalb Creat Ratio: 0.6 mg/g (ref 0.0–30.0)
Microalb, Ur: <0.7 mg/dL (ref 0.0–1.9)

## 2019-08-16 LAB — HEMOGLOBIN A1C: Hgb A1c MFr Bld: 6.4 % (ref 4.6–6.5)

## 2019-08-16 LAB — LIPID PANEL
Cholesterol: 126 mg/dL (ref 0–200)
HDL: 52.9 mg/dL (ref >39.00)
LDL Cholesterol: 47 mg/dL (ref 0–99)
NonHDL: 73.42
Total CHOL/HDL Ratio: 2
Triglycerides: 131 mg/dL (ref 0.0–149.0)
VLDL: 26.2 mg/dL (ref 0.0–40.0)

## 2019-08-16 MED ORDER — LEVOCETIRIZINE DIHYDROCHLORIDE 5 MG PO TABS: 5.0000 mg | ORAL_TABLET | Freq: Every evening | ORAL | 1 refills | Status: DC

## 2019-08-16 MED FILL — LEVOCETIRIZINE 5 MG TABLET: 5 | 90 days supply | Qty: 90 | Fill #0

## 2019-08-16 NOTE — Patient Instructions (Signed)
Health Maintenance, Female Adopting a healthy lifestyle and getting preventive care are important in promoting health and wellness. Ask your health care provider about:  The right schedule for you to have regular tests and exams.  Things you can do on your own to prevent diseases and keep yourself healthy. What should I know about diet, weight, and exercise? Eat a healthy diet   Eat a diet that includes plenty of vegetables, fruits, low-fat dairy products, and lean protein.  Do not eat a lot of foods that are high in solid fats, added sugars, or sodium. Maintain a healthy weight Body mass index (BMI) is used to identify weight problems. It estimates body fat based on height and weight. Your health care provider can help determine your BMI and help you achieve or maintain a healthy weight. Get regular exercise Get regular exercise. This is one of the most important things you can do for your health. Most adults should:  Exercise for at least 150 minutes each week. The exercise should increase your heart rate and make you sweat (moderate-intensity exercise).  Do strengthening exercises at least twice a week. This is in addition to the moderate-intensity exercise.  Spend less time sitting. Even light physical activity can be beneficial. Watch cholesterol and blood lipids Have your blood tested for lipids and cholesterol at 56 years of age, then have this test every 5 years. Have your cholesterol levels checked more often if:  Your lipid or cholesterol levels are high.  You are older than 56 years of age.  You are at high risk for heart disease. What should I know about cancer screening? Depending on your health history and family history, you may need to have cancer screening at various ages. This may include screening for:  Breast cancer.  Cervical cancer.  Colorectal cancer.  Skin cancer.  Lung cancer. What should I know about heart disease, diabetes, and high blood  pressure? Blood pressure and heart disease  High blood pressure causes heart disease and increases the risk of stroke. This is more likely to develop in people who have high blood pressure readings, are of African descent, or are overweight.  Have your blood pressure checked: ? Every 3-5 years if you are 18-39 years of age. ? Every year if you are 40 years old or older. Diabetes Have regular diabetes screenings. This checks your fasting blood sugar level. Have the screening done:  Once every three years after age 40 if you are at a normal weight and have a low risk for diabetes.  More often and at a younger age if you are overweight or have a high risk for diabetes. What should I know about preventing infection? Hepatitis B If you have a higher risk for hepatitis B, you should be screened for this virus. Talk with your health care provider to find out if you are at risk for hepatitis B infection. Hepatitis C Testing is recommended for:  Everyone born from 1945 through 1965.  Anyone with known risk factors for hepatitis C. Sexually transmitted infections (STIs)  Get screened for STIs, including gonorrhea and chlamydia, if: ? You are sexually active and are younger than 56 years of age. ? You are older than 56 years of age and your health care provider tells you that you are at risk for this type of infection. ? Your sexual activity has changed since you were last screened, and you are at increased risk for chlamydia or gonorrhea. Ask your health care provider if   you are at risk.  Ask your health care provider about whether you are at high risk for HIV. Your health care provider may recommend a prescription medicine to help prevent HIV infection. If you choose to take medicine to prevent HIV, you should first get tested for HIV. You should then be tested every 3 months for as long as you are taking the medicine. Pregnancy  If you are about to stop having your period (premenopausal) and  you may become pregnant, seek counseling before you get pregnant.  Take 400 to 800 micrograms (mcg) of folic acid every day if you become pregnant.  Ask for birth control (contraception) if you want to prevent pregnancy. Osteoporosis and menopause Osteoporosis is a disease in which the bones lose minerals and strength with aging. This can result in bone fractures. If you are 65 years old or older, or if you are at risk for osteoporosis and fractures, ask your health care provider if you should:  Be screened for bone loss.  Take a calcium or vitamin D supplement to lower your risk of fractures.  Be given hormone replacement therapy (HRT) to treat symptoms of menopause. Follow these instructions at home: Lifestyle  Do not use any products that contain nicotine or tobacco, such as cigarettes, e-cigarettes, and chewing tobacco. If you need help quitting, ask your health care provider.  Do not use street drugs.  Do not share needles.  Ask your health care provider for help if you need support or information about quitting drugs. Alcohol use  Do not drink alcohol if: ? Your health care provider tells you not to drink. ? You are pregnant, may be pregnant, or are planning to become pregnant.  If you drink alcohol: ? Limit how much you use to 0-1 drink a day. ? Limit intake if you are breastfeeding.  Be aware of how much alcohol is in your drink. In the [place] one drink equals one 12 oz bottle of beer (355 mL), one 5 oz glass of wine (148 mL), or one 1 oz glass of hard liquor (44 mL). General instructions  Schedule regular health, dental, and eye exams.  Stay current with your vaccines.  Tell your health care provider if: ? You often feel depressed. ? You have ever been abused or do not feel safe at home. Summary  Adopting a healthy lifestyle and getting preventive care are important in promoting health and wellness.  Follow your health care provider's instructions about healthy  diet, exercising, and getting tested or screened for diseases.  Follow your health care provider's instructions on monitoring your cholesterol and blood pressure. This information is not intended to replace advice given to you by your health care provider. Make sure you discuss any questions you have with your health care provider. Document Revised: 03/11/2018 Document Reviewed: 03/11/2018 Elsevier Patient Education  2020 [employer]  

## 2019-08-16 NOTE — Progress Notes (Signed)
Subjective:  Patient ID: Brooke Wood, female    DOB: Jun 19, 1965  Age: 54 y.o. MRN: [id number]  CC: Hypertension, Diabetes, and Annual Exam  This visit occurred during the SARS-CoV-2 public health emergency.  Safety protocols were in place, including screening questions prior to the visit, additional usage of staff PPE, and extensive cleaning of exam room while observing appropriate contact time as indicated for disinfecting solutions.   HPI Brooke Wood presents for a CPX.  She complains of hearing loss and wants to have her hearing tested.  She tells me her blood pressure has been well controlled.  She is active and denies any recent episodes of chest pain, shortness of breath, palpitations, edema, or fatigue.  Outpatient Medications Prior to Visit  Medication Sig Dispense Refill  . aspirin 81 MG EC tablet TAKE 1 TABLET (81 MG TOTAL) BY MOUTH DAILY. 90 tablet 1  . rosuvastatin (CRESTOR) 5 MG tablet TAKE 1 TABLET (5 MG TOTAL) BY MOUTH DAILY. 90 tablet 1  . triamcinolone (NASACORT) 55 MCG/ACT AERO nasal inhaler Place 2 sprays into the nose daily. 32.4 mL 3  . valsartan-hydrochlorothiazide (DIOVAN-HCT) 160-25 MG tablet TAKE 1 TABLET BY MOUTH DAILY. 90 tablet 1  . levocetirizine (XYZAL) 5 MG tablet Take 1 tablet (5 mg total) by mouth every evening. 90 tablet 1  . methocarbamol (ROBAXIN) 500 MG tablet Take 1 tablet (500 mg total) by mouth every 6 (six) hours as needed for muscle spasms. 60 tablet 0  . potassium chloride SA (K-DUR,KLOR-CON) 20 MEQ tablet TAKE 1 TABLET BY MOUTH 3 TIMES DAILY (Patient not taking: Reported on 08/16/2019) 90 tablet 3  . DIMETHICONE, TOPICAL, 1 % LOTN Apply 1 Act topically 2 (two) times daily. 180 g 2  . fluocinonide cream (LIDEX) [id number] % Apply 1 application topically 2 (two) times daily. 60 g 2  . meloxicam (MOBIC) 15 MG tablet Take 1 tablet (15 mg total) by mouth daily. 90 tablet 0   No facility-administered medications prior to visit.    ROS Review of  Systems  Constitutional: Negative.  Negative for diaphoresis, fatigue and unexpected weight change.  HENT: Positive for hearing loss. Negative for ear pain.   Eyes: Negative.   Respiratory: Negative for cough, chest tightness, shortness of breath and wheezing.   Cardiovascular: Negative for chest pain, palpitations and leg swelling.  Gastrointestinal: Positive for anal bleeding and blood in stool. Negative for abdominal pain, constipation, diarrhea, nausea and vomiting.       She had an episode of painless, BRBPR a few weeks ago.  It has not recurred since then.  Endocrine: Negative.   Genitourinary: Negative.  Negative for difficulty urinating and dysuria.  Musculoskeletal: Negative.  Negative for arthralgias and myalgias.  Skin: Negative.  Negative for color change.  Allergic/Immunologic: Negative.   Neurological: Negative.  Negative for dizziness, weakness, light-headedness and headaches.  Hematological: Negative for adenopathy. Does not bruise/bleed easily.  Psychiatric/Behavioral: Negative.     Objective:  BP 136/76 (BP Location: Left Arm, Patient Position: Sitting, Cuff Size: Normal)   Pulse 70   Temp 98.2 F (36.8 C) (Oral)   Resp 16   Ht 5\' 6"  (1.676 m)   Wt 161 lb (73 kg)   LMP 01/07/2016 (Approximate)   SpO2 98%   BMI 25.99 kg/m   BP Readings from Last 3 Encounters:  08/16/19 136/76  04/07/19 120/78  02/08/19 124/76    Wt Readings from Last 3 Encounters:  08/16/19 161 lb (73 kg)  04/07/19 156  lb 2 oz (70.8 kg)  02/08/19 153 lb (69.4 kg)    Physical Exam Vitals reviewed.  Constitutional:      Appearance: Normal appearance.  HENT:     Right Ear: Tympanic membrane, ear canal and external ear normal.     Left Ear: Tympanic membrane, ear canal and external ear normal.     Mouth/Throat:     Mouth: Mucous membranes are moist.  Eyes:     General: No scleral icterus.    Conjunctiva/sclera: Conjunctivae normal.  Cardiovascular:     Rate and Rhythm: Normal rate  and regular rhythm.     Heart sounds: No murmur.  Pulmonary:     Effort: Pulmonary effort is normal.     Breath sounds: No stridor. No wheezing or rhonchi.  Abdominal:     General: Abdomen is flat.     Palpations: There is no mass.     Tenderness: There is no abdominal tenderness. There is no guarding.  Musculoskeletal:        General: Normal range of motion.     Cervical back: Neck supple.     Right lower leg: No edema.     Left lower leg: No edema.  Lymphadenopathy:     Cervical: No cervical adenopathy.  Skin:    General: Skin is warm and dry.     Coloration: Skin is not pale.  Neurological:     General: No focal deficit present.     Mental Status: She is alert.  Psychiatric:        Mood and Affect: Mood normal.        Behavior: Behavior normal.     Lab Results  Component Value Date   WBC 6.0 08/16/2019   HGB 13.6 08/16/2019   HCT 40.8 08/16/2019   PLT 219.0 08/16/2019   GLUCOSE 109 (H) 08/16/2019   CHOL 126 08/16/2019   TRIG 131.0 08/16/2019   HDL 52.90 08/16/2019   LDLCALC 47 08/16/2019   ALT 18 08/16/2019   AST 13 08/16/2019   NA 137 08/16/2019   K 3.8 08/16/2019   CL 98 08/16/2019   CREATININE 0.88 08/16/2019   BUN 18 08/16/2019   CO2 32 08/16/2019   TSH 1.40 07/02/2018   HGBA1C 6.4 08/16/2019   MICROALBUR <0.7 08/16/2019    MM 3D SCREEN BREAST BILATERAL  Result Date: 05/17/2019 CLINICAL DATA:  Screening. EXAM: DIGITAL SCREENING BILATERAL MAMMOGRAM WITH TOMO AND CAD COMPARISON:  Previous exam(s). ACR Breast Density Category c: The breast tissue is heterogeneously dense, which may obscure small masses. FINDINGS: There are no findings suspicious for malignancy. Images were processed with CAD. IMPRESSION: No mammographic evidence of malignancy. A result letter of this screening mammogram will be mailed directly to the patient. RECOMMENDATION: Screening mammogram in one year. (Code:[id number]-01Y) BI-RADS CATEGORY  1: Negative. Electronically Signed   By: Audie Pinto M.D.   On: 05/17/2019 15:48    Assessment & Plan:   Ashiyah was seen today for hypertension, diabetes and annual exam.  Diagnoses and all orders for this visit:  Essential hypertension, benign- Her blood pressure is well controlled.  Electrolytes and renal function are normal. -     Basic metabolic panel; Future -     Urinalysis, Routine w reflex microscopic; Future -     CBC with Differential/Platelet; Future -     CBC with Differential/Platelet -     Urinalysis, Routine w reflex microscopic -     Basic metabolic panel  Type II diabetes mellitus  with manifestations ([place])- Her A1c is at 6.4%.  Her blood sugars are adequately well controlled. -     Basic metabolic panel; Future -     Hemoglobin A1c; Future -     Microalbumin / creatinine urine ratio; Future -     HM Diabetes Foot Exam -     Microalbumin / creatinine urine ratio -     Hemoglobin A1c -     Basic metabolic panel  Hyperlipidemia with target LDL less than 130- She has achieved her LDL goal and is doing well on the statin. -     Lipid panel; Future -     Hepatic function panel; Future -     Hepatic function panel -     Lipid panel  Screening for HIV (human immunodeficiency virus) -     HIV Antibody (routine testing w rflx); Future -     HIV Antibody (routine testing w rflx)  Routine general medical examination at a health care facility- Exam completed, labs reviewed, vaccines reviewed and updated, screening for cervical and breast cancer is up-to-date, she is referred for colon cancer screening.  Patient education was given. -     HIV Antibody (routine testing w rflx); Future -     HIV Antibody (routine testing w rflx)  Hx of adenomatous colonic polyps  Bilateral hearing loss, unspecified hearing loss type -     Ambulatory referral to Audiology  Seasonal allergic rhinitis due to pollen -     levocetirizine (XYZAL) 5 MG tablet; Take 1 tablet (5 mg total) by mouth every evening.   I have discontinued  Helene Kelp I. [place]ossum's meloxicam, methocarbamol, fluocinonide cream, and DIMETHICONE (TOPICAL). I am also having her maintain her potassium chloride SA, triamcinolone, valsartan-hydrochlorothiazide, aspirin, rosuvastatin, and levocetirizine.  Meds ordered this encounter  Medications  . levocetirizine (XYZAL) 5 MG tablet    Sig: Take 1 tablet (5 mg total) by mouth every evening.    Dispense:  90 tablet    Refill:  1     Follow-up: Return in about 6 months (around 02/16/2020).  [name], MD

## 2019-09-14 MED FILL — VALSARTAN-HCTZ 160-25 MG TA: 160-25 | 30 days supply | Qty: 30 | Fill #3

## 2019-09-14 MED FILL — ROSUVASTATIN CALCIUM 5 MG T: 5 | 30 days supply | Qty: 30 | Fill #3

## 2019-10-05 MED FILL — ASPIRIN 81 MG TBEC: 81 | 30 days supply | Qty: 30 | Fill #1

## 2019-10-12 MED FILL — VALSARTAN-HCTZ 160-25 MG TA: 160-25 | 30 days supply | Qty: 30 | Fill #4

## 2019-10-12 MED FILL — ROSUVASTATIN CALCIUM 5 MG T: 5 | 30 days supply | Qty: 30 | Fill #4

## 2019-11-09 MED FILL — LEVOCETIRIZINE 5 MG TABLET: 5 | 90 days supply | Qty: 90 | Fill #1

## 2019-11-09 MED FILL — ASPIRIN 81 MG TBEC: 81 | 30 days supply | Qty: 30 | Fill #2

## 2019-11-09 MED FILL — VALSARTAN-HCTZ 160-25 MG TA: 160-25 | 30 days supply | Qty: 30 | Fill #5

## 2019-11-09 MED FILL — ROSUVASTATIN CALCIUM 5 MG T: 5 | 30 days supply | Qty: 30 | Fill #5

## 2019-11-15 LAB — HM DIABETES EYE EXAM

## 2019-11-19 ENCOUNTER — Encounter: Payer: Self-pay | Admitting: Internal Medicine

## 2019-12-03 ENCOUNTER — Other Ambulatory Visit: Payer: Self-pay | Admitting: Obstetrics and Gynecology

## 2019-12-03 DIAGNOSIS — N6310 Unspecified lump in the right breast, unspecified quadrant: Secondary | ICD-10-CM

## 2019-12-07 ENCOUNTER — Other Ambulatory Visit: Payer: Self-pay | Admitting: Internal Medicine

## 2019-12-07 DIAGNOSIS — I10 Essential (primary) hypertension: Secondary | ICD-10-CM

## 2019-12-07 DIAGNOSIS — E785 Hyperlipidemia, unspecified: Secondary | ICD-10-CM

## 2019-12-07 MED FILL — ROSUVASTATIN CALCIUM 5 MG T: 5 | 30 days supply | Qty: 30 | Fill #0

## 2019-12-07 MED FILL — VALSARTAN-HCTZ 160-25 MG TA: 160-25 | 30 days supply | Qty: 30 | Fill #0

## 2019-12-07 MED FILL — ASPIRIN 81 MG TBEC: 81 | 30 days supply | Qty: 30 | Fill #3

## 2019-12-21 ENCOUNTER — Ambulatory Visit
Admission: RE | Admit: 2019-12-21 | Discharge: 2019-12-21 | Disposition: A | Discharge status: Home / Self Care | Payer: BC Managed Care – PPO | Source: Ambulatory Visit | Attending: Obstetrics and Gynecology | Admitting: Obstetrics and Gynecology

## 2019-12-21 ENCOUNTER — Other Ambulatory Visit: Payer: Self-pay

## 2019-12-21 ENCOUNTER — Other Ambulatory Visit: Payer: Self-pay | Admitting: Obstetrics and Gynecology

## 2019-12-21 DIAGNOSIS — N6310 Unspecified lump in the right breast, unspecified quadrant: Secondary | ICD-10-CM

## 2019-12-29 ENCOUNTER — Other Ambulatory Visit: Payer: Self-pay | Admitting: Internal Medicine

## 2019-12-29 DIAGNOSIS — E785 Hyperlipidemia, unspecified: Secondary | ICD-10-CM

## 2019-12-30 MED FILL — ROSUVASTATIN CALCIUM 5 MG T: 5 | 30 days supply | Qty: 30 | Fill #1

## 2019-12-30 MED FILL — VALSARTAN-HCTZ 160-25 MG TA: 160-25 | 30 days supply | Qty: 30 | Fill #1

## 2019-12-30 MED FILL — ASPIRIN 81 MG TBEC: 81 | 30 days supply | Qty: 30 | Fill #0

## 2020-01-17 ENCOUNTER — Encounter: Payer: BC Managed Care – PPO | Admitting: Adult Health

## 2020-02-10 ENCOUNTER — Other Ambulatory Visit: Payer: Self-pay | Admitting: Internal Medicine

## 2020-02-10 DIAGNOSIS — I10 Essential (primary) hypertension: Secondary | ICD-10-CM

## 2020-02-10 DIAGNOSIS — E785 Hyperlipidemia, unspecified: Secondary | ICD-10-CM

## 2020-02-10 MED FILL — ROSUVASTATIN CALCIUM 5 MG T: 5 | 30 days supply | Qty: 30 | Fill #0

## 2020-02-10 MED FILL — ASPIRIN 81 MG TBEC: 81 | 30 days supply | Qty: 30 | Fill #1

## 2020-02-10 MED FILL — VALSARTAN-HCTZ 160-25 MG TA: 160-25 | 30 days supply | Qty: 30 | Fill #0

## 2020-02-11 ENCOUNTER — Encounter: Payer: Self-pay | Admitting: Adult Health

## 2020-02-11 ENCOUNTER — Inpatient Hospital Stay: Payer: BC Managed Care – PPO | Attending: Adult Health | Admitting: Adult Health

## 2020-02-11 ENCOUNTER — Other Ambulatory Visit: Payer: Self-pay

## 2020-02-11 ENCOUNTER — Telehealth: Payer: Self-pay | Admitting: Adult Health

## 2020-02-11 VITALS — BP 114/84 | HR 96 | Temp 97.0°F | Resp 19 | Ht 66.0 in | Wt 165.0 lb

## 2020-02-11 DIAGNOSIS — Z8249 Family history of ischemic heart disease and other diseases of the circulatory system: Secondary | ICD-10-CM | POA: Diagnosis not present

## 2020-02-11 DIAGNOSIS — Z90710 Acquired absence of both cervix and uterus: Secondary | ICD-10-CM | POA: Insufficient documentation

## 2020-02-11 DIAGNOSIS — Z9079 Acquired absence of other genital organ(s): Secondary | ICD-10-CM | POA: Insufficient documentation

## 2020-02-11 DIAGNOSIS — Z923 Personal history of irradiation: Secondary | ICD-10-CM | POA: Insufficient documentation

## 2020-02-11 DIAGNOSIS — Z853 Personal history of malignant neoplasm of breast: Secondary | ICD-10-CM | POA: Insufficient documentation

## 2020-02-11 DIAGNOSIS — Z90722 Acquired absence of ovaries, bilateral: Secondary | ICD-10-CM | POA: Diagnosis not present

## 2020-02-11 DIAGNOSIS — Z170 Estrogen receptor positive status [ER+]: Secondary | ICD-10-CM | POA: Diagnosis not present

## 2020-02-11 DIAGNOSIS — Z79899 Other long term (current) drug therapy: Secondary | ICD-10-CM | POA: Diagnosis not present

## 2020-02-11 DIAGNOSIS — Z801 Family history of malignant neoplasm of trachea, bronchus and lung: Secondary | ICD-10-CM | POA: Diagnosis not present

## 2020-02-11 DIAGNOSIS — C50411 Malignant neoplasm of upper-outer quadrant of right female breast: Secondary | ICD-10-CM

## 2020-02-11 DIAGNOSIS — I10 Essential (primary) hypertension: Secondary | ICD-10-CM | POA: Diagnosis not present

## 2020-02-11 NOTE — Telephone Encounter (Signed)
Scheduled appointment per 11/12 los. Spoke to patient who is aware of appointment date and time. Gave patient calendar print out.

## 2020-02-11 NOTE — Patient Instructions (Addendum)
Bone Health Bones protect organs, store calcium, anchor muscles, and support the whole body. Keeping your bones strong is important, especially as you get older. You can take actions to help keep your bones strong and healthy. Why is keeping my bones healthy important?  Keeping your bones healthy is important because your body constantly replaces bone cells. Cells get old, and new cells take their place. As we age, we lose bone cells because the body may not be able to make enough new cells to replace the old cells. The amount of bone cells and bone tissue you have is referred to as bone mass. The higher your bone mass, the stronger your bones. The aging process leads to an overall loss of bone mass in the body, which can increase the likelihood of:  Joint pain and stiffness.  Broken bones.  A condition in which the bones become weak and brittle (osteoporosis). A large decline in bone mass occurs in older adults. In women, it occurs about the time of menopause. What actions can I take to keep my bones healthy? Good health habits are important for maintaining healthy bones. This includes eating nutritious foods and exercising regularly. To have healthy bones, you need to get enough of the right minerals and vitamins. Most nutrition experts recommend getting these nutrients from the foods that you eat. In some cases, taking supplements may also be recommended. Doing certain types of exercise is also important for bone health. What are the nutritional recommendations for healthy bones?  Eating a well-balanced diet with plenty of calcium and vitamin D will help to protect your bones. Nutritional recommendations vary from person to person. Ask your health care provider what is healthy for you. Here are some general guidelines. Get enough calcium Calcium is the most important (essential) mineral for bone health. Most people can get enough calcium from their diet, but supplements may be recommended for  people who are at risk for osteoporosis. Good sources of calcium include:  Dairy products, such as low-fat or nonfat milk, cheese, and yogurt.  Dark green leafy vegetables, such as bok choy and broccoli.  Calcium-fortified foods, such as orange juice, cereal, bread, soy beverages, and tofu products.  Nuts, such as almonds. Follow these recommended amounts for daily calcium intake:  Children, age 1-3: 700 mg.  Children, age 4-8: 1,000 mg.  Children, age 9-13: 1,300 mg.  Teens, age 14-18: 1,300 mg.  Adults, age 19-50: 1,000 mg.  Adults, age 51-70: ? Men: 1,000 mg. ? Women: 1,200 mg.  Adults, age 71 or older: 1,200 mg.  Pregnant and breastfeeding females: ? Teens: 1,300 mg. ? Adults: 1,000 mg. Get enough vitamin D Vitamin D is the most essential vitamin for bone health. It helps the body absorb calcium. Sunlight stimulates the skin to make vitamin D, so be sure to get enough sunlight. If you live in a cold climate or you do not get outside often, your health care provider may recommend that you take vitamin D supplements. Good sources of vitamin D in your diet include:  Egg yolks.  Saltwater fish.  Milk and cereal fortified with vitamin D. Follow these recommended amounts for daily vitamin D intake:  Children and teens, age 1-18: 600 international units.  Adults, age 50 or younger: 400-800 international units.  Adults, age 51 or older: 800-1,000 international units. Get other important nutrients Other nutrients that are important for bone health include:  Phosphorus. This mineral is found in meat, poultry, dairy foods, nuts, and legumes. The   recommended daily intake for adult men and adult women is 700 mg.  Magnesium. This mineral is found in seeds, nuts, dark green vegetables, and legumes. The recommended daily intake for adult men is 400-420 mg. For adult women, it is 310-320 mg.  Vitamin K. This vitamin is found in green leafy vegetables. The recommended daily  intake is 120 mg for adult men and 90 mg for adult women. What type of physical activity is best for building and maintaining healthy bones? Weight-bearing and strength-building activities are important for building and maintaining healthy bones. Weight-bearing activities cause muscles and bones to work against gravity. Strength-building activities increase the strength of the muscles that support bones. Weight-bearing and muscle-building activities include:  Walking and hiking.  Jogging and running.  Dancing.  Gym exercises.  Lifting weights.  Tennis and racquetball.  Climbing stairs.  Aerobics. Adults should get at least 30 minutes of moderate physical activity on most days. Children should get at least 60 minutes of moderate physical activity on most days. Ask your health care provider what type of exercise is best for you. How can I find out if my bone mass is low? Bone mass can be measured with an X-ray test called a bone mineral density (BMD) test. This test is recommended for all women who are age 47 or older. It may also be recommended for:  Men who are age 99 or older.  People who are at risk for osteoporosis because of: ? Having bones that break easily. ? Having a long-term disease that weakens bones, such as kidney disease or rheumatoid arthritis. ? Having menopause earlier than normal. ? Taking medicine that weakens bones, such as steroids, thyroid hormones, or hormone treatment for breast cancer or prostate cancer. ? Smoking. ? Drinking three or more alcoholic drinks a day. If you find that you have a low bone mass, you may be able to prevent osteoporosis or further bone loss by changing your diet and lifestyle. Where can I find more information? For more information, check out the following websites:  [place]: AviationTales.fr  [employer]: www.bones.SouthExposed.es  International Osteoporosis Foundation:  Administrator.iofbonehealth.org Summary  The aging process leads to an overall loss of bone mass in the body, which can increase the likelihood of broken bones and osteoporosis.  Eating a well-balanced diet with plenty of calcium and vitamin D will help to protect your bones.  Weight-bearing and strength-building activities are also important for building and maintaining strong bones.  Bone mass can be measured with an X-ray test called a bone mineral density (BMD) test. This information is not intended to replace advice given to you by your health care provider. Make sure you discuss any questions you have with your health care provider. Document Revised: 04/14/2017 Document Reviewed: 04/14/2017 Elsevier Patient Education  2020 [place] Following a healthy eating pattern may help you to achieve and maintain a healthy body weight, reduce the risk of chronic disease, and live a long and productive life. It is important to follow a healthy eating pattern at an appropriate calorie level for your body. Your nutritional needs should be met primarily through food by choosing a variety of nutrient-rich foods. What are tips for following this plan? Reading food labels  Read labels and choose the following: ? Reduced or low sodium. ? Juices with 100% fruit juice. ? Foods with low saturated fats and high polyunsaturated and monounsaturated fats. ? Foods with whole grains, such as whole wheat, cracked  wheat, brown rice, and wild rice. ? Whole grains that are fortified with folic acid. This is recommended for women who are pregnant or who want to become pregnant.  Read labels and avoid the following: ? Foods with a lot of added sugars. These include foods that contain brown sugar, corn sweetener, corn syrup, dextrose, fructose, glucose, high-fructose corn syrup, honey, invert sugar, lactose, malt syrup, maltose, molasses, raw sugar, sucrose, trehalose, or turbinado sugar.  Do not eat  more than the following amounts of added sugar per day:  6 teaspoons (25 g) for women.  9 teaspoons (38 g) for men. ? Foods that contain processed or refined starches and grains. ? Refined grain products, such as white flour, degermed cornmeal, white bread, and white rice. Shopping  Choose nutrient-rich snacks, such as vegetables, whole fruits, and nuts. Avoid high-calorie and high-sugar snacks, such as potato chips, fruit snacks, and candy.  Use oil-based dressings and spreads on foods instead of solid fats such as butter, stick margarine, or cream cheese.  Limit pre-made sauces, mixes, and "instant" products such as flavored rice, instant noodles, and ready-made pasta.  Try more plant-protein sources, such as tofu, tempeh, black beans, edamame, lentils, nuts, and seeds.  Explore eating plans such as the Mediterranean diet or vegetarian diet. Cooking  Use oil to saut or stir-fry foods instead of solid fats such as butter, stick margarine, or lard.  Try baking, boiling, grilling, or broiling instead of frying.  Remove the fatty part of meats before cooking.  Steam vegetables in water or broth. Meal planning   At meals, imagine dividing your plate into fourths: ? One-half of your plate is fruits and vegetables. ? One-fourth of your plate is whole grains. ? One-fourth of your plate is protein, especially lean meats, poultry, eggs, tofu, beans, or nuts.  Include low-fat dairy as part of your daily diet. Lifestyle  Choose healthy options in all settings, including home, work, school, restaurants, or stores.  Prepare your food safely: ? Wash your hands after handling raw meats. ? Keep food preparation surfaces clean by regularly washing with hot, soapy water. ? Keep raw meats separate from ready-to-eat foods, such as fruits and vegetables. ? Cook seafood, meat, poultry, and eggs to the recommended internal temperature. ? Store foods at safe temperatures. In general:  Keep  cold foods at 75F (4.4C) or below.  Keep hot foods at 175F (60C) or above.  Keep your freezer at [facility] (-17.8C) or below.  Foods are no longer safe to eat when they have been between the temperatures of 40-175F (4.4-60C) for more than 2 hours. What foods should I eat? Fruits Aim to eat 2 cup-equivalents of fresh, canned (in natural juice), or frozen fruits each day. Examples of 1 cup-equivalent of fruit include 1 small apple, 8 large strawberries, 1 cup canned fruit,  cup dried fruit, or 1 cup 100% juice. Vegetables Aim to eat 2-3 cup-equivalents of fresh and frozen vegetables each day, including different varieties and colors. Examples of 1 cup-equivalent of vegetables include 2 medium carrots, 2 cups raw, leafy greens, 1 cup chopped vegetable (raw or cooked), or 1 medium baked potato. Grains Aim to eat 6 ounce-equivalents of whole grains each day. Examples of 1 ounce-equivalent of grains include 1 slice of bread, 1 cup ready-to-eat cereal, 3 cups popcorn, or  cup cooked rice, pasta, or cereal. Meats and other proteins Aim to eat 5-6 ounce-equivalents of protein each day. Examples of 1 ounce-equivalent of protein include 1 egg, 1/2 cup  nuts or seeds, or 1 tablespoon (16 g) peanut butter. A cut of meat or fish that is the size of a deck of cards is about 3-4 ounce-equivalents.  Of the protein you eat each week, try to have at least 8 ounces come from seafood. This includes salmon, trout, herring, and anchovies. Dairy Aim to eat 3 cup-equivalents of fat-free or low-fat dairy each day. Examples of 1 cup-equivalent of dairy include 1 cup (240 mL) milk, 8 ounces (250 g) yogurt, 1 ounces (44 g) natural cheese, or 1 cup (240 mL) fortified soy milk. Fats and oils  Aim for about 5 teaspoons (21 g) per day. Choose monounsaturated fats, such as canola and olive oils, avocados, peanut butter, and most nuts, or polyunsaturated fats, such as sunflower, corn, and soybean oils, walnuts, pine nuts,  sesame seeds, sunflower seeds, and flaxseed. Beverages  Aim for six 8-oz glasses of water per day. Limit coffee to three to five 8-oz cups per day.  Limit caffeinated beverages that have added calories, such as soda and energy drinks.  Limit alcohol intake to no more than 1 drink a day for nonpregnant women and 2 drinks a day for men. One drink equals 12 oz of beer (355 mL), 5 oz of wine (148 mL), or 1 oz of hard liquor (44 mL). Seasoning and other foods  Avoid adding excess amounts of salt to your foods. Try flavoring foods with herbs and spices instead of salt.  Avoid adding sugar to foods.  Try using oil-based dressings, sauces, and spreads instead of solid fats. This information is based on general [place] nutrition guidelines. For more information, visit BuildDNA.es. Exact amounts may vary based on your nutrition needs. Summary  A healthy eating plan may help you to maintain a healthy weight, reduce the risk of chronic diseases, and stay active throughout your life.  Plan your meals. Make sure you eat the right portions of a variety of nutrient-rich foods.  Try baking, boiling, grilling, or broiling instead of frying.  Choose healthy options in all settings, including home, work, school, restaurants, or stores. This information is not intended to replace advice given to you by your health care provider. Make sure you discuss any questions you have with your health care provider. Document Revised: 06/30/2017 Document Reviewed: 06/30/2017 Elsevier Patient Education  [employer].

## 2020-02-11 NOTE — Progress Notes (Signed)
CLINIC:  Survivorship   REASON FOR VISIT:  Routine follow-up for history of breast cancer.   BRIEF ONCOLOGIC HISTORY:   (1) status post right breast biopsy on 03/11/2012 for an invasive ductal carcinoma with tubular features, grade 2, ER positive, PR positive, HER-2/neu negative, Ki-67 20%  (2) status post definitive right lumpectomy and sentinel lymph node biopsy on 04/09/2012. 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 One sentinel node had isolated tumor cells. All margins clean.              (a) in the 2018 calcification her tumor would be stage IB  (3) Oncotype DX performed. Her score was 10, giving her a 7% risk of outside the breast recurrence if her only systemic therapy is tamoxifen for 5 years  (4) She completed radiation therapy in April 2015  (5) She began tamoxifen in May 2014 completing 5 years April 2019  (6) status post hysterectomy with left salpingo-oophorectomy January 22, 2017, with benign pathology  INTERVAL HISTORY:  Brooke Wood presents to the [facility] today for routine follow-up for her history of breast cancer.  Overall, she reports feeling quite well.   She underwent bilateral breast screening mammogram in 05/2019 that was negative for malignancy and showed breast density category C.  She also underwent right breast mammogram and ultrasound in 12/2019 due to axillary tenderness that was negative.   She wants some information on healthy eating and exercise.  She wants to lose some weight.     REVIEW OF SYSTEMS:  Review of Systems  Constitutional: Negative for appetite change, chills, fatigue, fever and unexpected weight change.  HENT:   Negative for hearing loss, lump/mass and trouble swallowing.   Eyes: Negative for eye problems and icterus.  Respiratory: Negative for chest tightness, cough and shortness of breath.   Cardiovascular: Negative for chest  pain, leg swelling and palpitations.  Gastrointestinal: Negative for abdominal distention, abdominal pain, constipation, diarrhea, nausea and vomiting.  Endocrine: Negative for hot flashes.  Genitourinary: Negative for difficulty urinating.   Musculoskeletal: Negative for arthralgias.  Skin: Negative for itching and rash.  Neurological: Negative for dizziness, extremity weakness, headaches and numbness.  Hematological: Negative for adenopathy. Does not bruise/bleed easily.  Psychiatric/Behavioral: Negative for depression. The patient is not nervous/anxious.   Breast: Denies any new nodularity, masses, tenderness, nipple changes, or nipple discharge.       PAST MEDICAL/SURGICAL HISTORY:  Past Medical History:  Diagnosis Date  . Allergy    Neg RAST 2007  . Breast cancer ([place]) 2014   right breast  . Difficult airway for intubation 02/19/2019  . Hx of adenomatous colonic polyps 03/03/2014   repeat 2022  . Hx of radiation therapy 06/15/12 - 07/29/12   right rbeast, 50.4 Gy x 28 fx, boost to cumulative dose 60.4 gray  . Hypertension   . Personal history of radiation therapy 2014  . PONV (postoperative nausea and vomiting)   . SVD (spontaneous vaginal delivery)    x 1  . Vocal cord dysfunction    Neg Methacholine challenge test 2007   Past Surgical History:  Procedure Laterality Date  . BREAST LUMPECTOMY Right 2014  . BREAST LUMPECTOMY WITH SENTINEL LYMPH NODE BIOPSY  04/09/2012   Procedure: BREAST LUMPECTOMY WITH SENTINEL LYMPH NODE BX;  Surgeon: Merrie Roof, MD;  Location: [facility];  Service: General;  Laterality: Right;  . BREAST SURGERY  1985   rt br bx  . COLONOSCOPY  03/2014   poylps  . CYSTOSCOPY N/A 01/22/2017   Procedure: CYSTOSCOPY;  Surgeon: Bobbye Charleston, MD;  Location: [facility];  Service: Gynecology;  Laterality: N/A;  . ROBOTIC ASSISTED TOTAL HYSTERECTOMY WITH BILATERAL SALPINGO OOPHERECTOMY Bilateral 01/22/2017   Procedure: ROBOTIC ASSISTED  TOTAL HYSTERECTOMY WITH BILATERAL SALPINGECTOMY AND LEFT OOPHORECTOMY;  Surgeon: Bobbye Charleston, MD;  Location: [facility];  Service: Gynecology;  Laterality: Bilateral;     ALLERGIES:  Allergies  Allergen Reactions  . Aspirin Hives    Pt is able to take ibuprofen without problems  . Codeine Other (See Comments)    "confusion"     CURRENT MEDICATIONS:  Outpatient Encounter Medications as of 02/11/2020  Medication Sig  . aspirin 81 MG EC tablet TAKE 1 TABLET (81 MG TOTAL) BY MOUTH DAILY.  Marland Kitchen levocetirizine (XYZAL) 5 MG tablet Take 1 tablet (5 mg total) by mouth every evening.  . rosuvastatin (CRESTOR) 5 MG tablet TAKE 1 TABLET BY MOUTH ONCE A DAY  . triamcinolone (NASACORT) 55 MCG/ACT AERO nasal inhaler Place 2 sprays into the nose daily.  . valsartan-hydrochlorothiazide (DIOVAN-HCT) 160-25 MG tablet TAKE 1 TABLET BY MOUTH ONCE A DAY  . potassium chloride SA (K-DUR,KLOR-CON) 20 MEQ tablet TAKE 1 TABLET BY MOUTH 3 TIMES DAILY (Patient not taking: Reported on 08/16/2019)   No facility-administered encounter medications on file as of 02/11/2020.     ONCOLOGIC FAMILY HISTORY:  Family History  Problem Relation Age of Onset  . Lung cancer Father 64       smoker  . Cancer Father        lung  . Hypertension Other   . Alcohol abuse Neg Hx   . Diabetes Neg Hx   . Early death Neg Hx   . Hearing loss Neg Hx   . Heart disease Neg Hx   . Hyperlipidemia Neg Hx   . Stroke Neg Hx   . Kidney disease Neg Hx   . Colon cancer Neg Hx   . Breast cancer Neg Hx     GENETIC COUNSELING/TESTING: Not at this time  SOCIAL HISTORY:  Social History   Socioeconomic History  . Marital status: Single    Spouse name: Not on file  . Number of children: Not on file  . Years of education: Not on file  . Highest education level: Not on file  Occupational History    Employer: UNEMPLOYED    Comment: [employer]  Tobacco Use  . Smoking status: Never Smoker  . Smokeless tobacco: Never Used   Vaping Use  . Vaping Use: Never used  Substance and Sexual Activity  . Alcohol use: No  . Drug use: No  . Sexual activity: Not Currently    Birth control/protection: None  Other Topics Concern  . Not on file  Social History Narrative   Regular exercise-No   Social Determinants of Health   Financial Resource Strain:   . Difficulty of Paying Living Expenses: Not on file  Food Insecurity:   . Worried About Charity fundraiser in the Last Year: Not on file  . Ran Out of Food in the Last Year: Not on file  Transportation Needs:   . Lack of Transportation (Medical): Not on file  . Lack of Transportation (Non-Medical): Not on file  Physical Activity:   . Days of Exercise per Week: Not on file  . Minutes of Exercise per Session: Not on file  Stress:   .  Feeling of Stress : Not on file  Social Connections:   . Frequency of Communication with Friends and Family: Not on file  . Frequency of Social Gatherings with Friends and Family: Not on file  . Attends Religious Services: Not on file  . Active Member of Clubs or Organizations: Not on file  . Attends Archivist Meetings: Not on file  . Marital Status: Not on file  Intimate Partner Violence:   . Fear of Current or Ex-Partner: Not on file  . Emotionally Abused: Not on file  . Physically Abused: Not on file  . Sexually Abused: Not on file      PHYSICAL EXAMINATION:  Vital Signs: Vitals:   02/11/20 1534  BP: 114/84  Pulse: 96  Resp: 19  Temp: (!) 97 F (36.1 C)  SpO2: 98%   Filed Weights   02/11/20 1534  Weight: 165 lb (74.8 kg)   General: Well-nourished, well-appearing female in no acute distress.  Unaccompanied today.   HEENT: Head is normocephalic.  Pupils equal and reactive to light. Conjunctivae clear without exudate.  Sclerae anicteric. Oral mucosa is pink, moist.  Oropharynx is pink without lesions or erythema. thyromegaly Lymph: No cervical, supraclavicular, or infraclavicular lymphadenopathy noted  on palpation.  Cardiovascular: Regular rate and rhythm.Marland Kitchen Respiratory: Clear to auscultation bilaterally. Chest expansion symmetric; breathing non-labored.  Breast Exam:  -Left breast: No appreciable masses on palpation. No skin redness, thickening, or peau d'orange appearance; no nipple retraction or nipple discharge;  -Right breast: No appreciable masses on palpation. No skin redness, thickening, or peau d'orange appearance; no nipple retraction or nipple discharge; mild distortion in symmetry at previous lumpectomy site well healed scar without erythema or nodularity. -Axilla: No axillary adenopathy bilaterally.  GI: Abdomen soft and round; non-tender, non-distended. Bowel sounds normoactive. No hepatosplenomegaly.   GU: Deferred.  Neuro: No focal deficits. Steady gait.  Psych: Mood and affect normal and appropriate for situation.  MSK: No focal spinal tenderness to palpation, full range of motion in bilateral upper extremities Extremities: No edema. Skin: Warm and dry.  LABORATORY DATA:  None for this visit   DIAGNOSTIC IMAGING:  Most recent mammogram:  CLINICAL DATA:  54 year old female with history of right breast cancer in 2014 status post lumpectomy and radiation. On recent exam the patient's position noted tenderness at the lumpectomy site as well as some fullness below the patient's scar. Patient is not sure if this is new or not.  EXAM: DIGITAL DIAGNOSTIC RIGHT MAMMOGRAM WITH TOMO  ULTRASOUND RIGHT BREAST  COMPARISON:  Previous exam(s).  ACR Breast Density Category c: The breast tissue is heterogeneously dense, which may obscure small masses.  FINDINGS: Mammogram:  Right breast: A skin BB marks the site of tenderness in the right axillary region. Spot compression tomosynthesis views were performed in addition to standard views. There are stable postsurgical changes. No definite new mass or other abnormality at the site of tenderness or elsewhere in the  right breast.  On physical exam, the patient is tender upon palpation of the lumpectomy scar in the low axilla. I do not feel a fixed discrete mass. There are no overt skin changes.  Ultrasound:  Targeted ultrasound is performed at the site of tenderness in the low right axilla demonstrating scarring but no suspicious mass or other abnormality.  IMPRESSION: No mammographic or sonographic evidence of malignancy or other abnormality at the site of tenderness along the patient's scar in the low right axilla.  RECOMMENDATION: 1. Recommend any further workup of  the right axillary tenderness be on a clinical basis. In discussion with the patient she wonders if the tenderness is related to muscle strain from her job. The tenderness could be musculoskeletal, therefore recommend the patient not perform any strenuous upper body activity for the next two weeks. Counseled patient to monitor this area over the next month and follow-up with her doctor if the pain does not improve.  2. Return for annual screening mammography which will be due in February 2022.  I have discussed the findings and recommendations with the patient. If applicable, a reminder letter will be sent to the patient regarding the next appointment.  BI-RADS CATEGORY  1: Negative.   Electronically Signed   By: Audie Pinto M.D.   On: 12/21/2019 12:19    ASSESSMENT AND PLAN:  Brooke Wood is a pleasant 54 y.o. 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   [facility] and routine follow-up.   1. History of breast cancer:  Brooke Wood is currently clinically and radiographically without evidence of disease or recurrence of breast cancer. She will be due for  mammogram in 05/2020; orders placed today.  She will return in one year for LTS follow up.  I encouraged her to call me with any questions or concerns before her next visit at the cancer center, and I would be happy to see her sooner, if needed.    2. Bone health:  She should f/u with her PCP regarding bone health and bone density management.  She was given information about bone health.  3. Cancer screening:  Due to Brooke Wood history and her age, she should receive screening for skin cancers, colon cancer, and gynecologic cancers. She was encouraged to follow-up with her PCP for appropriate cancer screenings.   4. Health maintenance and wellness promotion: Brooke Wood was encouraged to consume 5-7 servings of fruits and vegetables per day. She was also encouraged to engage in moderate to vigorous exercise for 30 minutes per day most days of the week. She was instructed to limit her alcohol consumption and continue to abstain from tobacco use.   Dispo:  -Return to cancer center in one year for LTS followup -mammogram in 05/2020  Total encounter time: 20 minutes*  Wilber Bihari, NP 02/11/20 3:38 PM Medical Oncology and Hematology [facility], [place] [address] Tel. [phone]    Fax. [phone]  *Total Encounter Time as defined by the Centers for Medicare and Medicaid Services includes, in addition to the face-to-face time of a patient visit (documented in the note above) non-face-to-face time: obtaining and reviewing outside history, ordering and reviewing medications, tests or procedures, care coordination (communications with other health care professionals or caregivers) and documentation in the medical record.     Note: PRIMARY CARE PROVIDER [name], [place] [phone]

## 2020-03-16 ENCOUNTER — Ambulatory Visit (INDEPENDENT_AMBULATORY_CARE_PROVIDER_SITE_OTHER): Payer: BC Managed Care – PPO | Admitting: Internal Medicine

## 2020-03-16 ENCOUNTER — Other Ambulatory Visit: Payer: Self-pay

## 2020-03-16 ENCOUNTER — Encounter: Payer: Self-pay | Admitting: Internal Medicine

## 2020-03-16 VITALS — BP 126/84 | HR 97 | Temp 98.2°F | Ht 66.0 in | Wt 165.0 lb

## 2020-03-16 DIAGNOSIS — Z86010 Personal history of colonic polyps: Secondary | ICD-10-CM

## 2020-03-16 DIAGNOSIS — I10 Essential (primary) hypertension: Secondary | ICD-10-CM | POA: Diagnosis not present

## 2020-03-16 DIAGNOSIS — E118 Type 2 diabetes mellitus with unspecified complications: Secondary | ICD-10-CM | POA: Diagnosis not present

## 2020-03-16 DIAGNOSIS — B351 Tinea unguium: Secondary | ICD-10-CM | POA: Diagnosis not present

## 2020-03-16 MED ORDER — TERBINAFINE HCL 250 MG PO TABS: 250.0000 mg | ORAL_TABLET | Freq: Every day | ORAL | 0 refills | Status: AC

## 2020-03-16 NOTE — Progress Notes (Signed)
Subjective:  Patient ID: Brooke Wood, female    DOB: 12-14-1965  Age: 54 y.o. MRN: [id number]  CC: Hypertension and Diabetes  This visit occurred during the SARS-CoV-2 public health emergency.  Safety protocols were in place, including screening questions prior to the visit, additional usage of staff PPE, and extensive cleaning of exam room while observing appropriate contact time as indicated for disinfecting solutions.    HPI Brooke Wood presents for f/up -  She complains that her toenails are painful and appear abnormal. This developed about 2 months ago. She denies CP, DOE, palpitations, edema, or fatigue  Outpatient Medications Prior to Visit  Medication Sig Dispense Refill  . aspirin 81 MG EC tablet TAKE 1 TABLET (81 MG TOTAL) BY MOUTH DAILY. 90 tablet 1  . levocetirizine (XYZAL) 5 MG tablet Take 1 tablet (5 mg total) by mouth every evening. 90 tablet 1  . potassium chloride SA (K-DUR,KLOR-CON) 20 MEQ tablet TAKE 1 TABLET BY MOUTH 3 TIMES DAILY 90 tablet 3  . rosuvastatin (CRESTOR) 5 MG tablet TAKE 1 TABLET BY MOUTH ONCE A DAY 30 tablet 1  . triamcinolone (NASACORT) 55 MCG/ACT AERO nasal inhaler Place 2 sprays into the nose daily. 32.4 mL 3  . valsartan-hydrochlorothiazide (DIOVAN-HCT) 160-25 MG tablet TAKE 1 TABLET BY MOUTH ONCE A DAY 30 tablet 1   No facility-administered medications prior to visit.    ROS Review of Systems  Constitutional: Negative for chills, fatigue and fever.  HENT: Negative.   Eyes: Negative for visual disturbance.  Respiratory: Negative for cough, chest tightness, shortness of breath and wheezing.   Cardiovascular: Negative for chest pain, palpitations and leg swelling.  Gastrointestinal: Negative for abdominal pain, constipation, diarrhea, nausea and vomiting.  Endocrine: Negative.  Negative for polydipsia, polyphagia and polyuria.  Genitourinary: Negative.  Negative for difficulty urinating.  Musculoskeletal: Negative.  Negative for  arthralgias and myalgias.  Skin: Negative.  Negative for color change and pallor.  Neurological: Negative.  Negative for dizziness and weakness.  Hematological: Negative for adenopathy. Does not bruise/bleed easily.  Psychiatric/Behavioral: Negative.     Objective:  BP 126/84   Pulse 97   Temp 98.2 F (36.8 C) (Oral)   Ht 5\' 6"  (1.676 m)   Wt 165 lb (74.8 kg)   LMP 01/07/2016 (Approximate)   SpO2 99%   BMI 26.63 kg/m   BP Readings from Last 3 Encounters:  03/16/20 126/84  02/11/20 114/84  08/16/19 136/76    Wt Readings from Last 3 Encounters:  03/16/20 165 lb (74.8 kg)  02/11/20 165 lb (74.8 kg)  08/16/19 161 lb (73 kg)    Physical Exam Vitals reviewed.  Constitutional:      Appearance: Normal appearance.  HENT:     Nose: Nose normal.     Mouth/Throat:     Mouth: Mucous membranes are moist.  Eyes:     General: No scleral icterus.    Conjunctiva/sclera: Conjunctivae normal.  Cardiovascular:     Rate and Rhythm: Normal rate and regular rhythm.     Pulses:          Dorsalis pedis pulses are 1+ on the right side and 1+ on the left side.       Posterior tibial pulses are 1+ on the right side and 1+ on the left side.     Heart sounds: No murmur heard.   Pulmonary:     Effort: Pulmonary effort is normal.     Breath sounds: No stridor. No  wheezing, rhonchi or rales.  Abdominal:     General: Abdomen is flat. Bowel sounds are normal. There is no distension.     Palpations: Abdomen is soft. There is no hepatomegaly, splenomegaly or mass.     Tenderness: There is no abdominal tenderness.  Musculoskeletal:        General: Normal range of motion.     Cervical back: Neck supple.  Feet:     Right foot:     Skin integrity: Skin integrity normal.     Toenail Condition: Fungal disease present.    Left foot:     Skin integrity: Skin integrity normal.     Toenail Condition: Fungal disease present. Lymphadenopathy:     Cervical: No cervical adenopathy.  Skin:     General: Skin is warm and dry.  Neurological:     General: No focal deficit present.     Mental Status: She is alert.  Psychiatric:        Mood and Affect: Mood normal.        Behavior: Behavior normal.     Lab Results  Component Value Date   WBC 6.0 08/16/2019   HGB 13.6 08/16/2019   HCT 40.8 08/16/2019   PLT 219.0 08/16/2019   GLUCOSE 89 03/20/2020   CHOL 126 08/16/2019   TRIG 131.0 08/16/2019   HDL 52.90 08/16/2019   LDLCALC 47 08/16/2019   ALT 18 08/16/2019   AST 13 08/16/2019   NA 138 03/20/2020   K 3.6 03/20/2020   CL 101 03/20/2020   CREATININE 0.65 03/20/2020   BUN 15 03/20/2020   CO2 30 03/20/2020   TSH 1.40 07/02/2018   HGBA1C 6.5 03/20/2020   MICROALBUR <0.7 08/16/2019    MM DIAG BREAST TOMO UNI RIGHT  Result Date: 12/21/2019 CLINICAL DATA:  54 year old female with history of right breast cancer in 2014 status post lumpectomy and radiation. On recent exam the patient's position noted tenderness at the lumpectomy site as well as some fullness below the patient's scar. Patient is not sure if this is new or not. EXAM: DIGITAL DIAGNOSTIC RIGHT MAMMOGRAM WITH TOMO ULTRASOUND RIGHT BREAST COMPARISON:  Previous exam(s). ACR Breast Density Category c: The breast tissue is heterogeneously dense, which may obscure small masses. FINDINGS: Mammogram: Right breast: A skin BB marks the site of tenderness in the right axillary region. Spot compression tomosynthesis views were performed in addition to standard views. There are stable postsurgical changes. No definite new mass or other abnormality at the site of tenderness or elsewhere in the right breast. On physical exam, the patient is tender upon palpation of the lumpectomy scar in the low axilla. I do not feel a fixed discrete mass. There are no overt skin changes. Ultrasound: Targeted ultrasound is performed at the site of tenderness in the low right axilla demonstrating scarring but no suspicious mass or other abnormality.  IMPRESSION: No mammographic or sonographic evidence of malignancy or other abnormality at the site of tenderness along the patient's scar in the low right axilla. RECOMMENDATION: 1. Recommend any further workup of the right axillary tenderness be on a clinical basis. In discussion with the patient she wonders if the tenderness is related to muscle strain from her job. The tenderness could be musculoskeletal, therefore recommend the patient not perform any strenuous upper body activity for the next two weeks. Counseled patient to monitor this area over the next month and follow-up with her doctor if the pain does not improve. 2. Return for annual screening mammography which  will be due in February 2022. I have discussed the findings and recommendations with the patient. If applicable, a reminder letter will be sent to the patient regarding the next appointment. BI-RADS CATEGORY  1: Negative. Electronically Signed   By: Audie Pinto M.D.   On: 12/21/2019 12:19   [place] AXILLA RIGHT  Result Date: 12/21/2019 CLINICAL DATA:  54 year old female with history of right breast cancer in 2014 status post lumpectomy and radiation. On recent exam the patient's position noted tenderness at the lumpectomy site as well as some fullness below the patient's scar. Patient is not sure if this is new or not. EXAM: DIGITAL DIAGNOSTIC RIGHT MAMMOGRAM WITH TOMO ULTRASOUND RIGHT BREAST COMPARISON:  Previous exam(s). ACR Breast Density Category c: The breast tissue is heterogeneously dense, which may obscure small masses. FINDINGS: Mammogram: Right breast: A skin BB marks the site of tenderness in the right axillary region. Spot compression tomosynthesis views were performed in addition to standard views. There are stable postsurgical changes. No definite new mass or other abnormality at the site of tenderness or elsewhere in the right breast. On physical exam, the patient is tender upon palpation of the lumpectomy scar in the low  axilla. I do not feel a fixed discrete mass. There are no overt skin changes. Ultrasound: Targeted ultrasound is performed at the site of tenderness in the low right axilla demonstrating scarring but no suspicious mass or other abnormality. IMPRESSION: No mammographic or sonographic evidence of malignancy or other abnormality at the site of tenderness along the patient's scar in the low right axilla. RECOMMENDATION: 1. Recommend any further workup of the right axillary tenderness be on a clinical basis. In discussion with the patient she wonders if the tenderness is related to muscle strain from her job. The tenderness could be musculoskeletal, therefore recommend the patient not perform any strenuous upper body activity for the next two weeks. Counseled patient to monitor this area over the next month and follow-up with her doctor if the pain does not improve. 2. Return for annual screening mammography which will be due in February 2022. I have discussed the findings and recommendations with the patient. If applicable, a reminder letter will be sent to the patient regarding the next appointment. BI-RADS CATEGORY  1: Negative. Electronically Signed   By: Audie Pinto M.D.   On: 12/21/2019 12:19    Assessment & Plan:   Florita was seen today for hypertension and diabetes.  Diagnoses and all orders for this visit:  Hx of adenomatous colonic polyps -     Ambulatory referral to Gastroenterology  Essential hypertension, benign- Her blood pressure is adequately well controlled.  Electrolytes and renal function are normal. -     Basic metabolic panel; Future -     Basic metabolic panel  Type II diabetes mellitus with manifestations ([place])- Her A1c is at 6.5%.  Her blood sugar is adequately well controlled. -     Basic metabolic panel; Future -     Hemoglobin A1c; Future -     Hemoglobin A1c -     Basic metabolic panel  Onychomycosis of toenail -     terbinafine (LAMISIL) 250 MG tablet; Take 1  tablet (250 mg total) by mouth daily.   I am having Brooke Wood start on terbinafine. I am also having her maintain her potassium chloride SA, triamcinolone, levocetirizine, aspirin, valsartan-hydrochlorothiazide, and rosuvastatin.  Meds ordered this encounter  Medications  . terbinafine (LAMISIL) 250 MG tablet    Sig: Take  1 tablet (250 mg total) by mouth daily.    Dispense:  90 tablet    Refill:  0     Follow-up: Return in about 6 months (around 09/14/2020).  [name], MD

## 2020-03-16 NOTE — Patient Instructions (Signed)
Fungal Nail Infection A fungal nail infection is a common infection of the toenails or fingernails. This condition affects toenails more often than fingernails. It often affects the great, or big, toes. More than one nail may be infected. The condition can be passed from person to person (is contagious). What are the causes? This condition is caused by a fungus. Several types of fungi can cause the infection. These fungi are common in moist and warm areas. If your hands or feet come into contact with the fungus, it may get into a crack in your fingernail or toenail and cause the infection. What increases the risk? The following factors may make you more likely to develop this condition:  Being female.  Being of older age.  Living with someone who has the fungus.  Walking barefoot in areas where the fungus thrives, such as showers or locker rooms.  Wearing shoes and socks that cause your feet to sweat.  Having a nail injury or a recent nail surgery.  Having certain medical conditions, such as: ? Athlete's foot. ? Diabetes. ? Psoriasis. ? Poor circulation. ? A weak body defense system (immune system). What are the signs or symptoms? Symptoms of this condition include:  A pale spot on the nail.  Thickening of the nail.  A nail that becomes yellow or brown.  A brittle or ragged nail edge.  A crumbling nail.  A nail that has lifted away from the nail bed. How is this diagnosed? This condition is diagnosed with a physical exam. Your health care provider may take a scraping or clipping from your nail to test for the fungus. How is this treated? Treatment is not needed for mild infections. If you have significant nail changes, treatment may include:  Antifungal medicines taken by mouth (orally). You may need to take the medicine for several weeks or several months, and you may not see the results for a long time. These medicines can cause side effects. Ask your health care provider  what problems to watch for.  Antifungal nail polish or nail cream. These may be used along with oral antifungal medicines.  Laser treatment of the nail.  Surgery to remove the nail. This may be needed for the most severe infections. It can take a long time, usually up to a year, for the infection to go away. The infection may also come back. Follow these instructions at home: Medicines  Take or apply over-the-counter and prescription medicines only as told by your health care provider.  Ask your health care provider about using over-the-counter mentholated ointment on your nails. Nail care  Trim your nails often.  Wash and dry your hands and feet every day.  Keep your feet dry: ? Wear absorbent socks, and change your socks frequently. ? Wear shoes that allow air to circulate, such as sandals or canvas tennis shoes. Throw out old shoes.  Do not use artificial nails.  If you go to a nail salon, make sure you choose one that uses clean instruments.  Use antifungal foot powder on your feet and in your shoes. General instructions  Do not share personal items, such as towels or nail clippers.  Do not walk barefoot in shower rooms or locker rooms.  Wear rubber gloves if you are working with your hands in wet areas.  Keep all follow-up visits as told by your health care provider. This is important. Contact a health care provider if: Your infection is not getting better or it is getting worse   after several months. Summary  A fungal nail infection is a common infection of the toenails or fingernails.  Treatment is not needed for mild infections. If you have significant nail changes, treatment may include taking medicine orally and applying medicine to your nails.  It can take a long time, usually up to a year, for the infection to go away. The infection may also come back.  Take or apply over-the-counter and prescription medicines only as told by your health care  provider.  Follow instructions for taking care of your nails to help prevent infection from coming back or spreading. This information is not intended to replace advice given to you by your health care provider. Make sure you discuss any questions you have with your health care provider. Document Revised: 07/09/2018 Document Reviewed: 08/22/2017 Elsevier Patient Education  2020 [employer]  

## 2020-03-17 MED FILL — VALSARTAN-HCTZ 160-25 MG TA: 160-25 | 30 days supply | Qty: 30 | Fill #1

## 2020-03-17 MED FILL — ROSUVASTATIN CALCIUM 5 MG T: 5 | 30 days supply | Qty: 30 | Fill #1

## 2020-03-17 MED FILL — ASPIRIN ADULT LOW STRENGTH: 81 | 30 days supply | Qty: 30 | Fill #2

## 2020-03-20 ENCOUNTER — Telehealth: Payer: Self-pay

## 2020-03-20 ENCOUNTER — Other Ambulatory Visit: Payer: Self-pay

## 2020-03-20 ENCOUNTER — Ambulatory Visit (INDEPENDENT_AMBULATORY_CARE_PROVIDER_SITE_OTHER): Payer: BC Managed Care – PPO

## 2020-03-20 DIAGNOSIS — Z23 Encounter for immunization: Secondary | ICD-10-CM

## 2020-03-20 LAB — HEMOGLOBIN A1C: Hgb A1c MFr Bld: 6.5 % (ref 4.6–6.5)

## 2020-03-20 LAB — BASIC METABOLIC PANEL
BUN: 15 mg/dL (ref 6–23)
CO2: 30 mEq/L (ref 19–32)
Calcium: 9.7 mg/dL (ref 8.4–10.5)
Chloride: 101 mEq/L (ref 96–112)
Creatinine, Ser: 0.65 mg/dL (ref 0.40–1.20)
GFR: 98.15 mL/min (ref >60.00)
Glucose, Bld: 89 mg/dL (ref 70–99)
Potassium: 3.6 mEq/L (ref 3.5–5.1)
Sodium: 138 mEq/L (ref 135–145)

## 2020-03-20 NOTE — Telephone Encounter (Signed)
Pt here for her nurse visit & would like to know can she use Clotrimazole 1% cream for her feet instead of the Lamisil.

## 2020-03-20 NOTE — Telephone Encounter (Signed)
Pt needs to resume Terbinafine PCP prescribed.

## 2020-03-20 NOTE — Telephone Encounter (Signed)
That is NOT an effective treatment option for toenail fungus

## 2020-03-22 ENCOUNTER — Other Ambulatory Visit: Payer: Self-pay | Admitting: Obstetrics and Gynecology

## 2020-03-22 DIAGNOSIS — Z1231 Encounter for screening mammogram for malignant neoplasm of breast: Secondary | ICD-10-CM

## 2020-04-18 ENCOUNTER — Other Ambulatory Visit: Payer: Self-pay | Admitting: Internal Medicine

## 2020-04-18 DIAGNOSIS — E785 Hyperlipidemia, unspecified: Secondary | ICD-10-CM

## 2020-04-18 DIAGNOSIS — I10 Essential (primary) hypertension: Secondary | ICD-10-CM

## 2020-04-18 MED FILL — VALSARTAN-HCTZ 160-25 MG TA: 160-25 | 30 days supply | Qty: 30 | Fill #0

## 2020-04-18 MED FILL — ROSUVASTATIN CALCIUM 5 MG T: 5 | 30 days supply | Qty: 30 | Fill #0

## 2020-04-18 MED FILL — ASPIRIN 81 MG TBEC: 81 | 30 days supply | Qty: 30 | Fill #3

## 2020-04-30 IMAGING — MG DIGITAL SCREENING BILATERAL MAMMOGRAM WITH TOMO AND CAD
8 series · 8 of 24 positions shown · non-contrast
Comparison: Previous exam(s).

CLINICAL DATA: Screening.

EXAM:
DIGITAL SCREENING BILATERAL MAMMOGRAM WITH TOMO AND CAD

[R CC synth-2D]
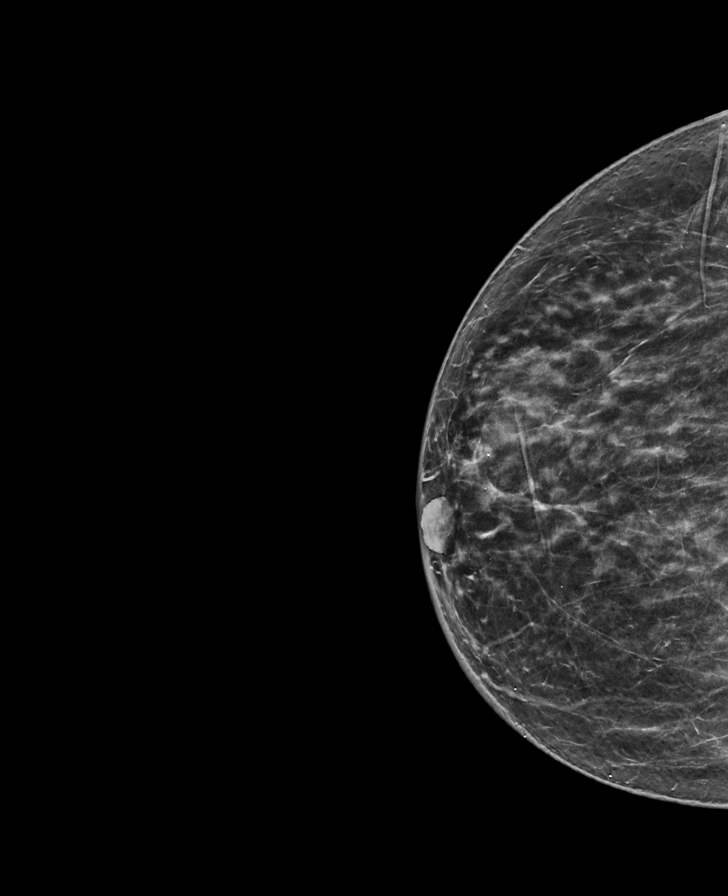

[R MLO synth-2D]
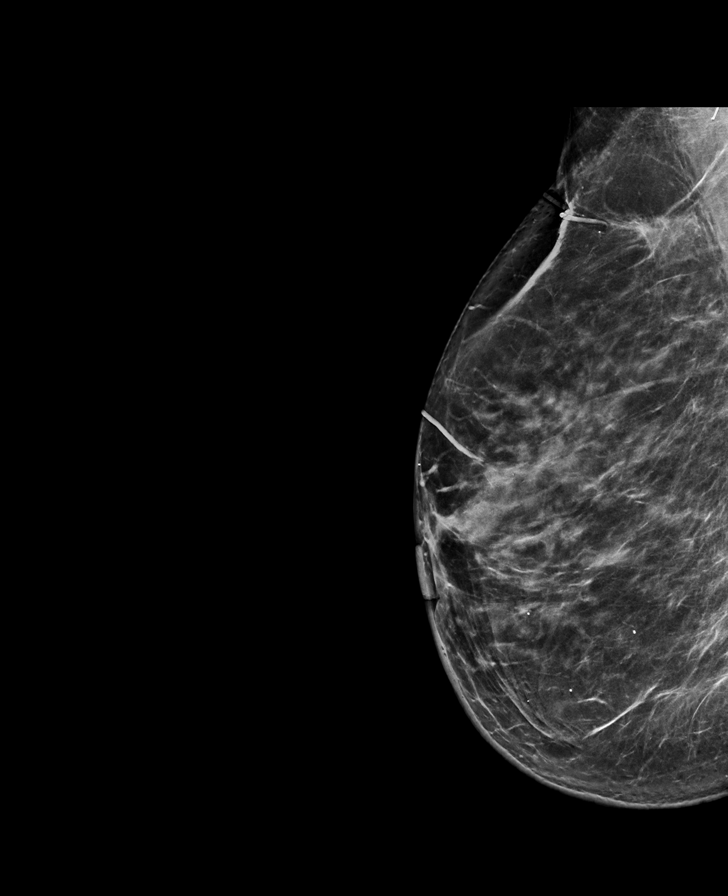

[L CC synth-2D]
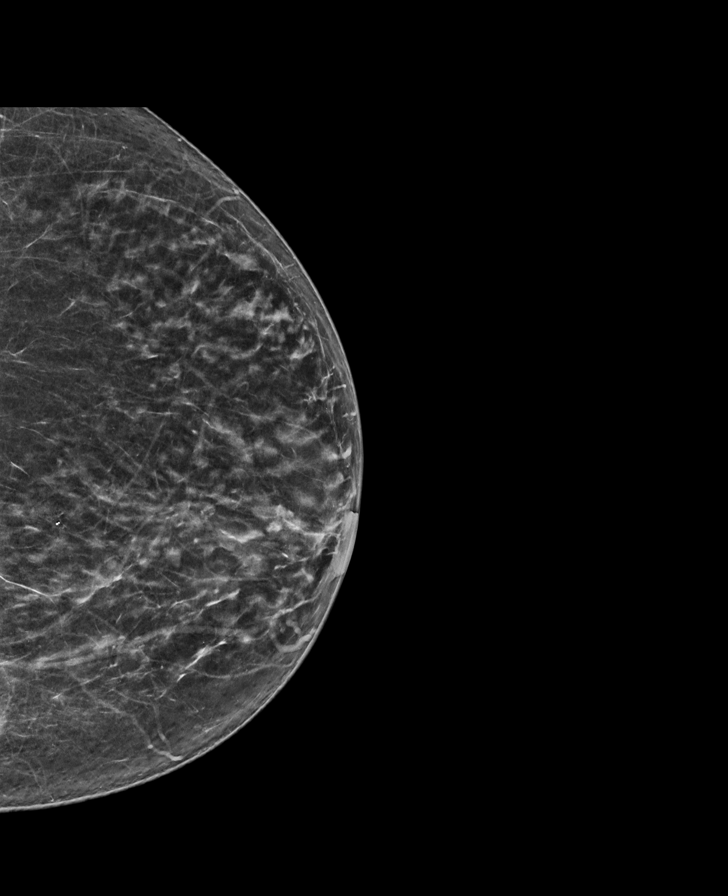

[L MLO synth-2D]
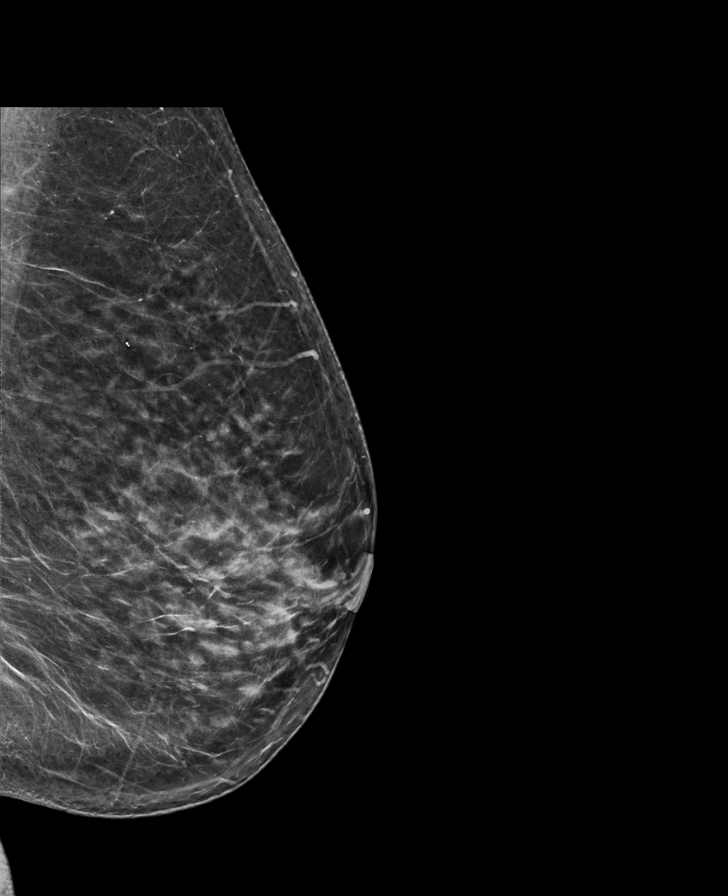

[R MLO tomo · tomo slice 42/83.0]
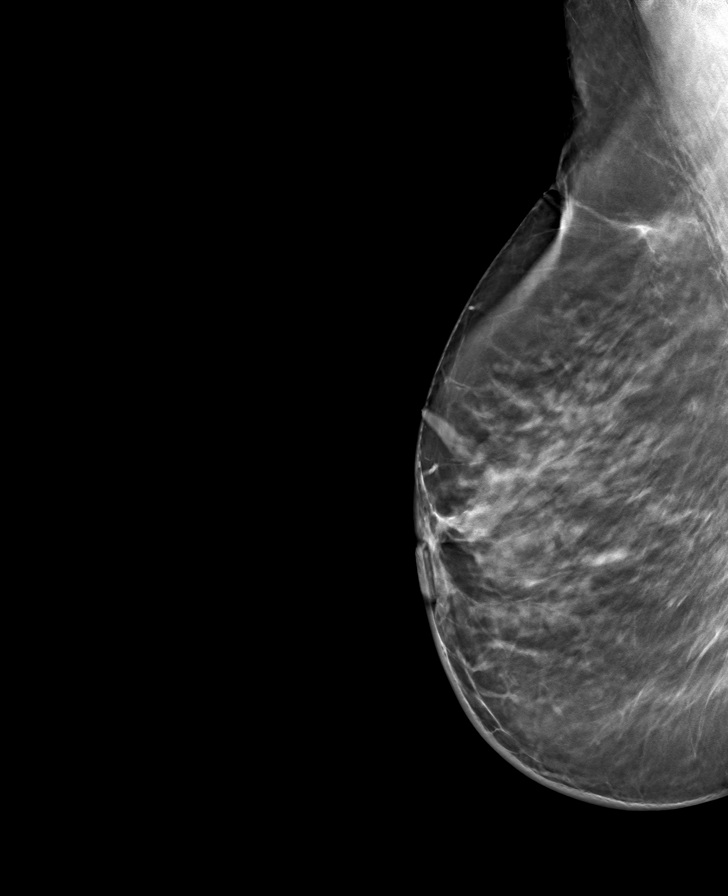

[L MLO tomo · tomo slice 37/74.0]
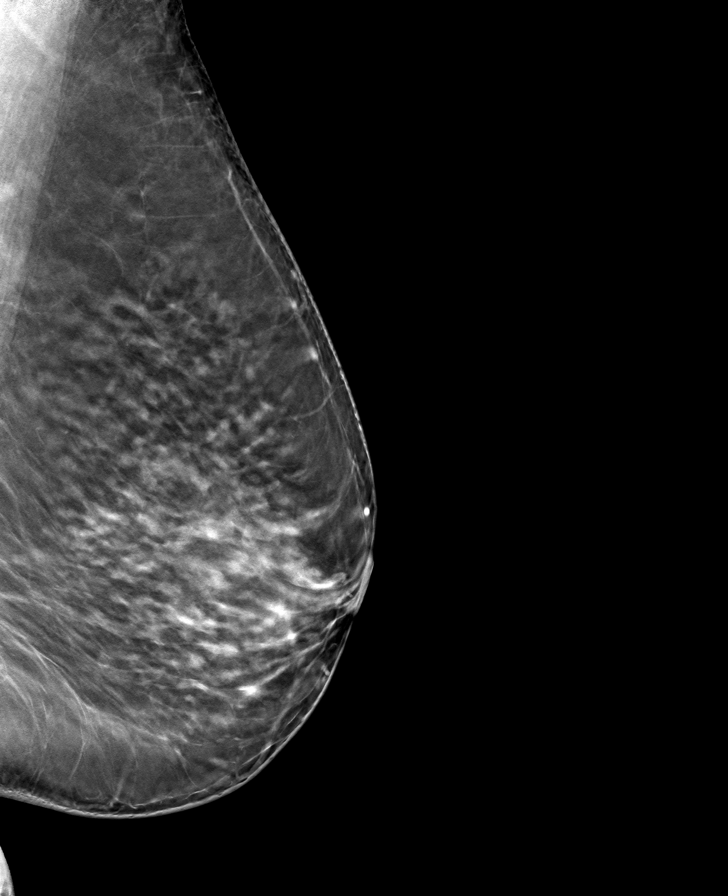

[L CC tomo · tomo slice 35/70.0]
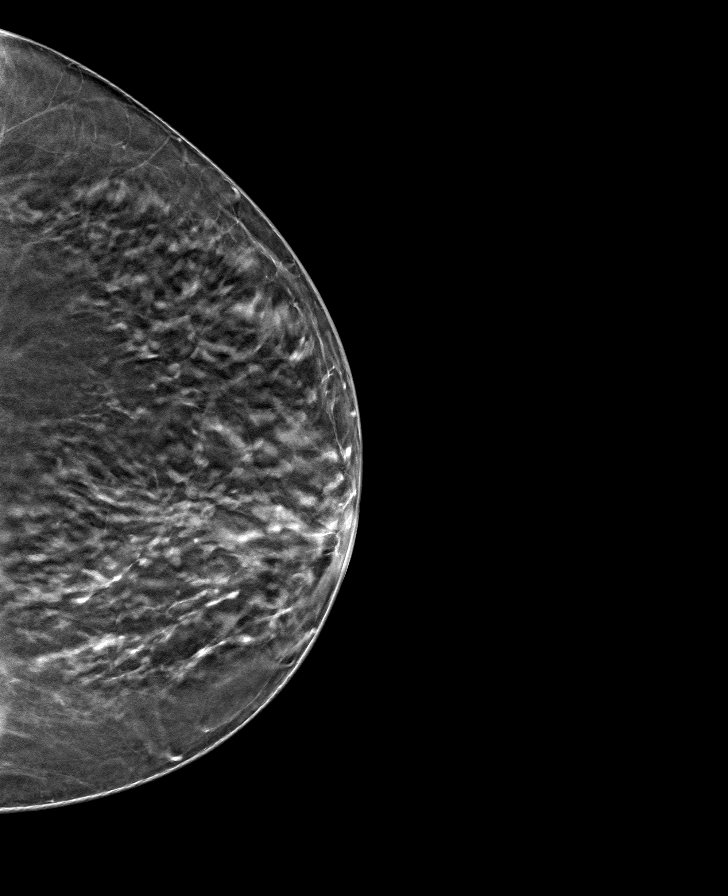

[R CC tomo · tomo slice 37/72.0]
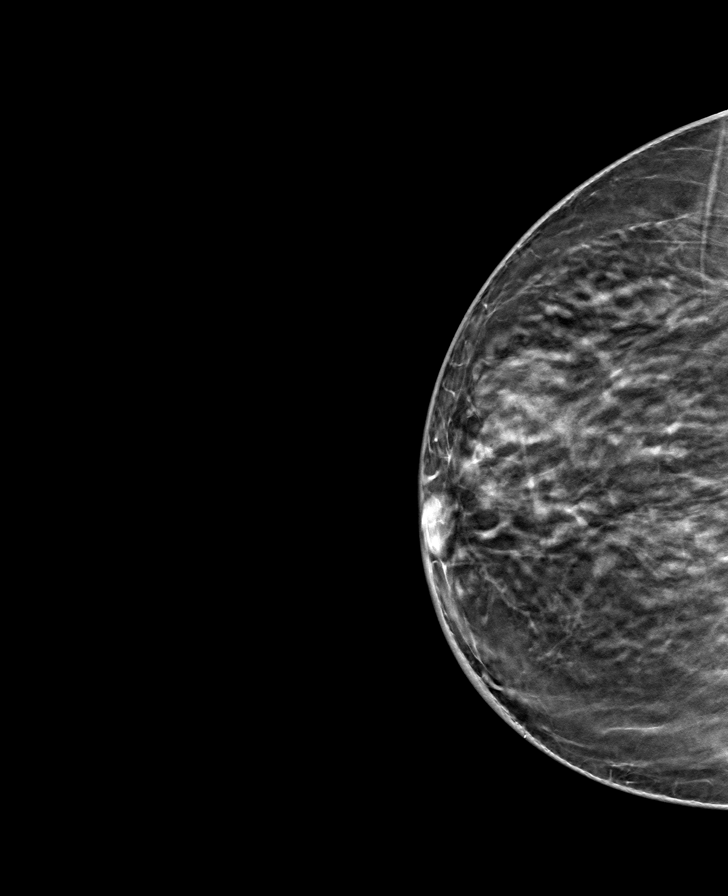

[8 of 24 positions shown; findings below may reference images not displayed]

ACR Breast Density Category c: The breast tissue is heterogeneously
dense, which may obscure small masses.
FINDINGS: There are no findings suspicious for malignancy. Images were
processed with CAD.
IMPRESSION: No mammographic evidence of malignancy. A result letter of this
screening mammogram will be mailed directly to the patient.

RECOMMENDATION:
Screening mammogram in one year. (Code:FT-U-LHB)

BI-RADS CATEGORY  1: Negative.

## 2020-05-18 MED FILL — ROSUVASTATIN CALCIUM 5 MG T: 5 | 30 days supply | Qty: 30 | Fill #1

## 2020-05-18 MED FILL — VALSARTAN-HCTZ 160-25 MG TA: 160-25 | 30 days supply | Qty: 30 | Fill #1

## 2020-05-18 MED FILL — ASPIRIN 81 MG TBEC: 81 | 30 days supply | Qty: 30 | Fill #4

## 2020-05-22 ENCOUNTER — Ambulatory Visit
Admission: RE | Admit: 2020-05-22 | Discharge: 2020-05-22 | Disposition: A | Discharge status: Home / Self Care | Payer: BC Managed Care – PPO | Source: Ambulatory Visit | Attending: Obstetrics and Gynecology | Admitting: Obstetrics and Gynecology

## 2020-05-22 ENCOUNTER — Other Ambulatory Visit: Payer: Self-pay

## 2020-05-22 DIAGNOSIS — Z1231 Encounter for screening mammogram for malignant neoplasm of breast: Secondary | ICD-10-CM

## 2020-05-29 ENCOUNTER — Encounter: Payer: Self-pay | Admitting: Internal Medicine

## 2020-05-29 ENCOUNTER — Other Ambulatory Visit: Payer: Self-pay

## 2020-05-29 ENCOUNTER — Ambulatory Visit (INDEPENDENT_AMBULATORY_CARE_PROVIDER_SITE_OTHER): Payer: BC Managed Care – PPO | Admitting: Internal Medicine

## 2020-05-29 DIAGNOSIS — M7989 Other specified soft tissue disorders: Secondary | ICD-10-CM | POA: Diagnosis not present

## 2020-05-29 DIAGNOSIS — I10 Essential (primary) hypertension: Secondary | ICD-10-CM | POA: Diagnosis not present

## 2020-05-29 DIAGNOSIS — M79662 Pain in left lower leg: Secondary | ICD-10-CM | POA: Diagnosis not present

## 2020-05-29 DIAGNOSIS — D1722 Benign lipomatous neoplasm of skin and subcutaneous tissue of left arm: Secondary | ICD-10-CM

## 2020-05-29 DIAGNOSIS — D1720 Benign lipomatous neoplasm of skin and subcutaneous tissue of unspecified limb: Secondary | ICD-10-CM | POA: Insufficient documentation

## 2020-05-29 NOTE — Progress Notes (Signed)
Patient ID: Brooke Wood, female   DOB: 1965-11-06, 55 y.o.   MRN: [id number]        Chief Complaint: left left pain and swelling, and left wrist mass       HPI:  Brooke Wood is a 55 y.o. female here with c/o 1 wk worsening LLE pain and swelling below the knee, after standing at work more in the past wk, just not really getting much better at night or with elevation, mild to mod, constant, nothing else makes better or worse and Pt denies chest pain, increased sob or doe, wheezing, orthopnea, PND, palpitations, dizziness or syncope.Also has a soft mass she noticed about the same time to the left post distal arm, just very concerned with her hx of breast ca and fearing it is coming back.  Denies new focal neuro s/s.   Pt denies fever, wt loss, night sweats, loss of appetite, or other constitutional symptoms No recent trauma or falls       Wt Readings from Last 3 Encounters:  05/29/20 162 lb (73.5 kg)  03/16/20 165 lb (74.8 kg)  02/11/20 165 lb (74.8 kg)   BP Readings from Last 3 Encounters:  05/29/20 120/80  03/16/20 126/84  02/11/20 114/84         Past Medical History:  Diagnosis Date  . Allergy    Neg RAST 2007  . Breast cancer ([place]) 2014   right breast  . Difficult airway for intubation 02/19/2019  . Hx of adenomatous colonic polyps 03/03/2014   repeat 2022  . Hx of radiation therapy 06/15/12 - 07/29/12   right rbeast, 50.4 Gy x 28 fx, boost to cumulative dose 60.4 gray  . Hypertension   . Personal history of radiation therapy 2014  . PONV (postoperative nausea and vomiting)   . SVD (spontaneous vaginal delivery)    x 1  . Vocal cord dysfunction    Neg Methacholine challenge test 2007   Past Surgical History:  Procedure Laterality Date  . BREAST LUMPECTOMY Right 2014  . BREAST LUMPECTOMY WITH SENTINEL LYMPH NODE BIOPSY  04/09/2012   Procedure: BREAST LUMPECTOMY WITH SENTINEL LYMPH NODE BX;  Surgeon: Merrie Roof, MD;  Location: [facility];  Service: General;   Laterality: Right;  . BREAST SURGERY  1985   rt br bx  . COLONOSCOPY  03/2014   poylps  . CYSTOSCOPY N/A 01/22/2017   Procedure: CYSTOSCOPY;  Surgeon: Bobbye Charleston, MD;  Location: [facility];  Service: Gynecology;  Laterality: N/A;  . ROBOTIC ASSISTED TOTAL HYSTERECTOMY WITH BILATERAL SALPINGO OOPHERECTOMY Bilateral 01/22/2017   Procedure: ROBOTIC ASSISTED TOTAL HYSTERECTOMY WITH BILATERAL SALPINGECTOMY AND LEFT OOPHORECTOMY;  Surgeon: Bobbye Charleston, MD;  Location: [facility];  Service: Gynecology;  Laterality: Bilateral;    reports that she has never smoked. She has never used smokeless tobacco. She reports that she does not drink alcohol and does not use drugs. family history includes Cancer in her father; Hypertension in an other family member; Lung cancer (age of onset: 85) in her father. Allergies  Allergen Reactions  . Aspirin Hives    Pt is able to take ibuprofen without problems  . Codeine Other (See Comments)    "confusion"   Current Outpatient Medications on File Prior to Visit  Medication Sig Dispense Refill  . aspirin 81 MG EC tablet TAKE 1 TABLET (81 MG TOTAL) BY MOUTH DAILY. 90 tablet 1  . levocetirizine (XYZAL) 5 MG tablet Take 1 tablet (5 mg total) by  mouth every evening. 90 tablet 1  . potassium chloride SA (K-DUR,KLOR-CON) 20 MEQ tablet TAKE 1 TABLET BY MOUTH 3 TIMES DAILY 90 tablet 3  . rosuvastatin (CRESTOR) 5 MG tablet TAKE 1 TABLET BY MOUTH ONCE A DAY 90 tablet 1  . terbinafine (LAMISIL) 250 MG tablet Take 1 tablet (250 mg total) by mouth daily. 90 tablet 0  . triamcinolone (NASACORT) 55 MCG/ACT AERO nasal inhaler Place 2 sprays into the nose daily. 32.4 mL 3  . valsartan-hydrochlorothiazide (DIOVAN-HCT) 160-25 MG tablet TAKE 1 TABLET BY MOUTH ONCE A DAY 90 tablet 1   No current facility-administered medications on file prior to visit.        ROS:  All others reviewed and negative.  Objective        PE:  BP 120/80   Pulse 86   Ht 5\' 6"  (1.676 m)   Wt 162  lb (73.5 kg)   LMP 01/07/2016 (Approximate)   SpO2 99%   BMI 26.15 kg/m                 Constitutional: Pt appears in NAD               HENT: Head: NCAT.                Right Ear: External ear normal.                 Left Ear: External ear normal.                Eyes: . Pupils are equal, round, and reactive to light. Conjunctivae and EOM are normal               Nose: without d/c or deformity               Neck: Neck supple. Gross normal ROM               Cardiovascular: Normal rate and regular rhythm.                 Pulmonary/Chest: Effort normal and breath sounds without rales or wheezing.                Abd:  Soft, NT, ND, + BS, no organomegaly               Neurological: Pt is alert. At baseline orientation, motor grossly intact               Skin: Skin is warm. No rashes, no other new lesions, LE edema - 1-2+ LLE below the knees, some varicose veins noted; left post distal arm with vague nondiscrete but probable fatty lipoma - raised but non tender and no overlying skin change               Psychiatric: Pt behavior is normal without agitation   Micro: none  Cardiac tracings I have personally interpreted today:  none  Pertinent Radiological findings (summarize): none   Lab Results  Component Value Date   WBC 6.0 08/16/2019   HGB 13.6 08/16/2019   HCT 40.8 08/16/2019   PLT 219.0 08/16/2019   GLUCOSE 89 03/20/2020   CHOL 126 08/16/2019   TRIG 131.0 08/16/2019   HDL 52.90 08/16/2019   LDLCALC 47 08/16/2019   ALT 18 08/16/2019   AST 13 08/16/2019   NA 138 03/20/2020   K 3.6 03/20/2020   CL 101 03/20/2020   CREATININE 0.65 03/20/2020   BUN 15 03/20/2020  CO2 30 03/20/2020   TSH 1.40 07/02/2018   HGBA1C 6.5 03/20/2020   MICROALBUR <0.7 08/16/2019   Assessment/Plan:  Brooke Wood is a 55 y.o. Black or African American [2] female with  has a past medical history of Allergy, Breast cancer ([place]) (2014), Difficult airway for intubation (02/19/2019), adenomatous colonic  polyps (03/03/2014), radiation therapy (06/15/12 - 07/29/12), Hypertension, Personal history of radiation therapy (2014), PONV (postoperative nausea and vomiting), SVD (spontaneous vaginal delivery), and Vocal cord dysfunction.  Pain and swelling of left lower leg May be due to venous insufficiency, but will check LE venous doppler with hx of breast ca; pt states cannot do this until tomorrow after 330 pm due to work  Lipoma of arm Small, d/w pt, no need for specific tx,  to f/u any worsening symptoms or concerns  Essential hypertension, benign BP Readings from Last 3 Encounters:  05/29/20 120/80  03/16/20 126/84  02/11/20 114/84   Stable, pt to continue medical treatment diovan hct *  Current Outpatient Medications (Cardiovascular):  .  rosuvastatin (CRESTOR) 5 MG tablet, TAKE 1 TABLET BY MOUTH ONCE A DAY .  valsartan-hydrochlorothiazide (DIOVAN-HCT) 160-25 MG tablet, TAKE 1 TABLET BY MOUTH ONCE A DAY  Current Outpatient Medications (Respiratory):  .  levocetirizine (XYZAL) 5 MG tablet, Take 1 tablet (5 mg total) by mouth every evening. .  triamcinolone (NASACORT) 55 MCG/ACT AERO nasal inhaler, Place 2 sprays into the nose daily.  Current Outpatient Medications (Analgesics):  .  aspirin 81 MG EC tablet, TAKE 1 TABLET (81 MG TOTAL) BY MOUTH DAILY.   Current Outpatient Medications (Other):  .  potassium chloride SA (K-DUR,KLOR-CON) 20 MEQ tablet, TAKE 1 TABLET BY MOUTH 3 TIMES DAILY .  terbinafine (LAMISIL) 250 MG tablet, Take 1 tablet (250 mg total) by mouth daily.   Followup: Return if symptoms worsen or fail to improve.  Cathlean Cower, MD 05/29/2020 7:45 PM [facility]

## 2020-05-29 NOTE — Assessment & Plan Note (Signed)
Small, d/w pt, no need for specific tx,  to f/u any worsening symptoms or concerns

## 2020-05-29 NOTE — Assessment & Plan Note (Addendum)
May be due to venous insufficiency, but will check LE venous doppler with hx of breast ca; pt states cannot do this until tomorrow after 330 pm due to work

## 2020-05-29 NOTE — Assessment & Plan Note (Signed)
BP Readings from Last 3 Encounters:  05/29/20 120/80  03/16/20 126/84  02/11/20 114/84   Stable, pt to continue medical treatment diovan hct *  Current Outpatient Medications (Cardiovascular):  .  rosuvastatin (CRESTOR) 5 MG tablet, TAKE 1 TABLET BY MOUTH ONCE A DAY .  valsartan-hydrochlorothiazide (DIOVAN-HCT) 160-25 MG tablet, TAKE 1 TABLET BY MOUTH ONCE A DAY  Current Outpatient Medications (Respiratory):  .  levocetirizine (XYZAL) 5 MG tablet, Take 1 tablet (5 mg total) by mouth every evening. .  triamcinolone (NASACORT) 55 MCG/ACT AERO nasal inhaler, Place 2 sprays into the nose daily.  Current Outpatient Medications (Analgesics):  .  aspirin 81 MG EC tablet, TAKE 1 TABLET (81 MG TOTAL) BY MOUTH DAILY.   Current Outpatient Medications (Other):  .  potassium chloride SA (K-DUR,KLOR-CON) 20 MEQ tablet, TAKE 1 TABLET BY MOUTH 3 TIMES DAILY .  terbinafine (LAMISIL) 250 MG tablet, Take 1 tablet (250 mg total) by mouth daily.

## 2020-05-29 NOTE — Patient Instructions (Signed)
The area on your arm is consistent with a Lipoma (fatty tumor but BENIGN)  You will be contacted regarding the referral for: left leg venous doppler test (an ultrasound - doe not hurt)   Please continue all other medications as before, and refills have been done if requested.  Please have the pharmacy call with any other refills you may need.  Please keep your appointments with your specialists as you may have planned

## 2020-05-30 ENCOUNTER — Telehealth: Payer: Self-pay

## 2020-05-30 ENCOUNTER — Ambulatory Visit (HOSPITAL_COMMUNITY)
Admission: RE | Admit: 2020-05-30 | Discharge: 2020-05-30 | Disposition: A | Discharge status: Home / Self Care | Payer: BC Managed Care – PPO | Source: Ambulatory Visit | Attending: Internal Medicine | Admitting: Internal Medicine

## 2020-05-30 DIAGNOSIS — M79662 Pain in left lower leg: Secondary | ICD-10-CM | POA: Insufficient documentation

## 2020-05-30 DIAGNOSIS — M7989 Other specified soft tissue disorders: Secondary | ICD-10-CM | POA: Insufficient documentation

## 2020-05-30 NOTE — Progress Notes (Signed)
Lower extremity venous has been completed.   Preliminary results in CV Proc.   Brooke Wood 05/30/2020 4:10 PM

## 2020-05-31 ENCOUNTER — Encounter: Payer: Self-pay | Admitting: Internal Medicine

## 2020-06-19 MED FILL — VALSARTAN-HCTZ 160-25 MG TA: 160-25 | 30 days supply | Qty: 30 | Fill #2

## 2020-06-19 MED FILL — ASPIRIN 81 MG TBEC: 81 | 30 days supply | Qty: 30 | Fill #5

## 2020-06-19 MED FILL — ROSUVASTATIN CALCIUM 5 MG T: 5 | 30 days supply | Qty: 30 | Fill #2

## 2020-06-28 ENCOUNTER — Telehealth: Payer: Self-pay

## 2020-06-28 NOTE — Telephone Encounter (Signed)
Pt called stating she is having LUE swelling as well as LLE swelling and was seen by a dr at her PCP office but wanted to make sure, "it's not cancer." Pt states she had a doppler performed and it was negative for DVT and she was told the "place on my arm is a lipoma." Pt denies pain//redness  to LUE but states she does have some pain in her LLE. This LPN advised pt to contact her PCP since sx have not improved. Pt verbalizes agreement and states she will call for an appt.

## 2020-07-10 ENCOUNTER — Other Ambulatory Visit (HOSPITAL_COMMUNITY): Payer: Self-pay

## 2020-07-10 ENCOUNTER — Telehealth: Payer: Self-pay | Admitting: Internal Medicine

## 2020-07-10 ENCOUNTER — Other Ambulatory Visit: Payer: Self-pay | Admitting: Internal Medicine

## 2020-07-10 DIAGNOSIS — J301 Allergic rhinitis due to pollen: Secondary | ICD-10-CM

## 2020-07-10 MED ORDER — TRIAMCINOLONE ACETONIDE 55 MCG/ACT NA AERO
2.0000 | INHALATION_SPRAY | Freq: Every day | NASAL | 3 refills | Status: DC
  Filled 2020-07-10: qty 32.4, 1d supply, fill #0

## 2020-07-10 MED ORDER — LEVOCETIRIZINE DIHYDROCHLORIDE 5 MG PO TABS
5.0000 mg | ORAL_TABLET | Freq: Every evening | ORAL | 1 refills | Status: DC
  Filled 2020-07-10: qty 30, 30d supply, fill #0
  Filled 2020-10-17: qty 90, 90d supply, fill #0

## 2020-07-10 NOTE — Telephone Encounter (Signed)
Pt declines virtual visit. She stated that she wants an in office OV. She has been informed of the office policy of a virtual visit first than proceed with an in office visit. She also can not provide a negative COVID test to bypass virtual visit and her reason being "I know better than that. I don't have that. So no I have no reason to be tested."

## 2020-07-10 NOTE — Telephone Encounter (Signed)
Nose running, feel bad She thinks someone needs to look in her nose wants to come in to be seen  Can't go pick up meds at [facility] today, wants to speak to [facility]

## 2020-07-10 NOTE — Telephone Encounter (Signed)
RX's sent to [facility]

## 2020-07-10 NOTE — Telephone Encounter (Signed)
Patient called and said that her allergies are acting up. She declined an appointment with another provider. She is requesting a call in regards to what she might be able to take to help her. She can be reached at [phone].

## 2020-07-10 NOTE — Telephone Encounter (Signed)
Called pt, LVM.   

## 2020-07-18 ENCOUNTER — Other Ambulatory Visit: Payer: Self-pay | Admitting: Internal Medicine

## 2020-07-18 ENCOUNTER — Other Ambulatory Visit (HOSPITAL_COMMUNITY): Payer: Self-pay

## 2020-07-18 DIAGNOSIS — E785 Hyperlipidemia, unspecified: Secondary | ICD-10-CM

## 2020-07-18 MED ORDER — ASPIRIN 81 MG PO TBEC
DELAYED_RELEASE_TABLET | Freq: Every day | ORAL | 1 refills | Status: AC
  Filled 2020-07-18: qty 90, 90d supply, fill #0

## 2020-07-18 MED FILL — Rosuvastatin Calcium Tab 5 MG: ORAL | 30 days supply | Qty: 30 | Fill #0 | Status: AC

## 2020-07-18 MED FILL — Valsartan-Hydrochlorothiazide Tab 160-25 MG: ORAL | 30 days supply | Qty: 30 | Fill #0 | Status: AC

## 2020-07-24 ENCOUNTER — Encounter: Payer: Self-pay | Admitting: Internal Medicine

## 2020-07-24 ENCOUNTER — Other Ambulatory Visit: Payer: Self-pay

## 2020-07-24 ENCOUNTER — Ambulatory Visit (INDEPENDENT_AMBULATORY_CARE_PROVIDER_SITE_OTHER): Payer: BC Managed Care – PPO | Admitting: Internal Medicine

## 2020-07-24 VITALS — BP 126/82 | HR 82 | Temp 98.2°F | Ht 66.0 in | Wt 160.0 lb

## 2020-07-24 DIAGNOSIS — I10 Essential (primary) hypertension: Secondary | ICD-10-CM | POA: Diagnosis not present

## 2020-07-24 DIAGNOSIS — Z23 Encounter for immunization: Secondary | ICD-10-CM

## 2020-07-24 DIAGNOSIS — E118 Type 2 diabetes mellitus with unspecified complications: Secondary | ICD-10-CM

## 2020-07-24 LAB — POCT GLYCOSYLATED HEMOGLOBIN (HGB A1C): Hemoglobin A1C: 6 % — AB (ref 4.0–5.6)

## 2020-07-24 NOTE — Patient Instructions (Signed)

## 2020-07-24 NOTE — Progress Notes (Signed)
Subjective:  Patient ID: Brooke Wood, female    DOB: Nov 08, 1965  Age: 55 y.o. MRN: [id number]  CC: Hypertension and Diabetes  This visit occurred during the SARS-CoV-2 public health emergency.  Safety protocols were in place, including screening questions prior to the visit, additional usage of staff PPE, and extensive cleaning of exam room while observing appropriate contact time as indicated for disinfecting solutions.    HPI Brooke Wood presents for f/up -   She tells me that her BP has been well controlled. She walks a lot and does not experience DOE, CP, SOB, edema ,or fatigue.  Outpatient Medications Prior to Visit  Medication Sig Dispense Refill  . aspirin 81 MG EC tablet TAKE 1 TABLET BY MOUTH ONCE DAILY 90 tablet 1  . levocetirizine (XYZAL) 5 MG tablet Take 1 tablet (5 mg total) by mouth every evening. 90 tablet 1  . potassium chloride SA (K-DUR,KLOR-CON) 20 MEQ tablet TAKE 1 TABLET BY MOUTH 3 TIMES DAILY 90 tablet 3  . rosuvastatin (CRESTOR) 5 MG tablet TAKE 1 TABLET BY MOUTH ONCE A DAY 90 tablet 1  . triamcinolone (NASACORT) 55 MCG/ACT AERO nasal inhaler Place 2 sprays into the nose daily. 32.4 mL 3  . valsartan-hydrochlorothiazide (DIOVAN-HCT) 160-25 MG tablet TAKE 1 TABLET BY MOUTH ONCE A DAY 90 tablet 1   No facility-administered medications prior to visit.    ROS Review of Systems  Constitutional: Negative.  Negative for appetite change, chills, diaphoresis, fatigue and fever.  HENT: Negative.   Eyes: Negative.   Respiratory: Negative for cough, chest tightness, shortness of breath and wheezing.   Cardiovascular: Negative for chest pain, palpitations and leg swelling.  Gastrointestinal: Negative for abdominal pain, constipation, diarrhea, nausea and vomiting.  Endocrine: Negative.   Genitourinary: Negative.  Negative for difficulty urinating.  Musculoskeletal: Negative for arthralgias and myalgias.  Skin: Negative.   Neurological: Negative.  Negative for  dizziness, weakness, light-headedness and numbness.  Hematological: Negative for adenopathy. Does not bruise/bleed easily.  Psychiatric/Behavioral: Negative.     Objective:  BP 126/82   Pulse 82   Temp 98.2 F (36.8 C) (Oral)   Ht 5\' 6"  (1.676 m)   Wt 160 lb (72.6 kg)   LMP 01/07/2016 (Approximate)   SpO2 98%   BMI 25.82 kg/m   BP Readings from Last 3 Encounters:  07/24/20 126/82  05/29/20 120/80  03/16/20 126/84    Wt Readings from Last 3 Encounters:  07/24/20 160 lb (72.6 kg)  05/29/20 162 lb (73.5 kg)  03/16/20 165 lb (74.8 kg)    Physical Exam Vitals reviewed.  Constitutional:      Appearance: Normal appearance.  HENT:     Nose: Nose normal.     Mouth/Throat:     Mouth: Mucous membranes are moist.  Eyes:     General: No scleral icterus.    Conjunctiva/sclera: Conjunctivae normal.  Cardiovascular:     Rate and Rhythm: Normal rate and regular rhythm.     Heart sounds: No murmur heard.   Pulmonary:     Effort: Pulmonary effort is normal.     Breath sounds: No stridor. No wheezing, rhonchi or rales.  Abdominal:     General: Abdomen is flat. Bowel sounds are normal. There is no distension.     Palpations: Abdomen is soft. There is no hepatomegaly, splenomegaly or mass.  Musculoskeletal:        General: Normal range of motion.     Cervical back: Neck supple.  Right lower leg: No edema.     Left lower leg: No edema.  Lymphadenopathy:     Cervical: No cervical adenopathy.  Skin:    General: Skin is warm and dry.     Coloration: Skin is not pale.  Neurological:     General: No focal deficit present.     Mental Status: She is alert.  Psychiatric:        Mood and Affect: Mood normal.        Behavior: Behavior normal.     Lab Results  Component Value Date   WBC 6.0 08/16/2019   HGB 13.6 08/16/2019   HCT 40.8 08/16/2019   PLT 219.0 08/16/2019   GLUCOSE 89 03/20/2020   CHOL 126 08/16/2019   TRIG 131.0 08/16/2019   HDL 52.90 08/16/2019   LDLCALC  47 08/16/2019   ALT 18 08/16/2019   AST 13 08/16/2019   NA 138 03/20/2020   K 3.6 03/20/2020   CL 101 03/20/2020   CREATININE 0.65 03/20/2020   BUN 15 03/20/2020   CO2 30 03/20/2020   TSH 1.40 07/02/2018   HGBA1C 6.0 (A) 07/24/2020   MICROALBUR <0.7 08/16/2019    VAS [place] LOWER EXTREMITY VENOUS (DVT)  Result Date: 05/30/2020  Lower Venous DVT Study Indications: Pain, and Swelling.  Comparison Study: NO PRIOR Performing Technologist: Abram Sander RVS  Examination Guidelines: A complete evaluation includes B-mode imaging, spectral Doppler, color Doppler, and power Doppler as needed of all accessible portions of each vessel. Bilateral testing is considered an integral part of a complete examination. Limited examinations for reoccurring indications may be performed as noted. The reflux portion of the exam is performed with the patient in reverse Trendelenburg.  +-----+---------------+---------+-----------+----------+--------------+ RIGHTCompressibilityPhasicitySpontaneityPropertiesThrombus Aging +-----+---------------+---------+-----------+----------+--------------+ CFV  Full           Yes      Yes                                 +-----+---------------+---------+-----------+----------+--------------+   +---------+---------------+---------+-----------+----------+--------------+ LEFT     CompressibilityPhasicitySpontaneityPropertiesThrombus Aging +---------+---------------+---------+-----------+----------+--------------+ CFV      Full           Yes      Yes                                 +---------+---------------+---------+-----------+----------+--------------+ SFJ      Full                                                        +---------+---------------+---------+-----------+----------+--------------+ FV Prox  Full                                                        +---------+---------------+---------+-----------+----------+--------------+ FV Mid   Full                                                         +---------+---------------+---------+-----------+----------+--------------+  FV DistalFull                                                        +---------+---------------+---------+-----------+----------+--------------+ PFV      Full                                                        +---------+---------------+---------+-----------+----------+--------------+ POP      Full           Yes      Yes                                 +---------+---------------+---------+-----------+----------+--------------+ PTV      Full                                                        +---------+---------------+---------+-----------+----------+--------------+ PERO     Full                                                        +---------+---------------+---------+-----------+----------+--------------+     Summary: RIGHT: - No evidence of common femoral vein obstruction.  LEFT: - There is no evidence of deep vein thrombosis in the lower extremity.  - No cystic structure found in the popliteal fossa.  *See table(s) above for measurements and observations. Electronically signed by Curt Jews MD on 05/30/2020 at 7:15:52 PM.    Final     Assessment & Plan:   Amora was seen today for hypertension and diabetes.  Diagnoses and all orders for this visit:  Essential hypertension, benign- Her BP is well controlled.  Type II diabetes mellitus with manifestations ([place])- Her blood sugar is adequately well controlled. -     POCT glycosylated hemoglobin (Hb A1C)  Need for vaccination -     Pneumococcal conjugate vaccine 20-valent  Other orders -     Tdap vaccine greater than or equal to 7yo IM   I am having [name] maintain her potassium chloride SA, valsartan-hydrochlorothiazide, rosuvastatin, levocetirizine, triamcinolone, and aspirin.  No orders of the defined types were placed in this encounter.    Follow-up: No  follow-ups on file.  [name], MD

## 2020-07-31 ENCOUNTER — Telehealth: Payer: Self-pay | Admitting: Adult Health

## 2020-07-31 NOTE — Telephone Encounter (Signed)
Scheduled appt per 5/2 sch msg. Pt called in requesting an earlier appt with Wilber Bihari. Pt is aware of appt date and time.

## 2020-08-10 ENCOUNTER — Inpatient Hospital Stay: Payer: BC Managed Care – PPO | Attending: Adult Health | Admitting: Adult Health

## 2020-08-10 ENCOUNTER — Other Ambulatory Visit: Payer: Self-pay

## 2020-08-10 DIAGNOSIS — Z9079 Acquired absence of other genital organ(s): Secondary | ICD-10-CM | POA: Diagnosis not present

## 2020-08-10 DIAGNOSIS — Z90721 Acquired absence of ovaries, unilateral: Secondary | ICD-10-CM | POA: Diagnosis not present

## 2020-08-10 DIAGNOSIS — Z853 Personal history of malignant neoplasm of breast: Secondary | ICD-10-CM | POA: Diagnosis not present

## 2020-08-10 DIAGNOSIS — Z923 Personal history of irradiation: Secondary | ICD-10-CM | POA: Diagnosis not present

## 2020-08-10 DIAGNOSIS — Z90710 Acquired absence of both cervix and uterus: Secondary | ICD-10-CM | POA: Diagnosis not present

## 2020-08-10 DIAGNOSIS — W010XXA Fall on same level from slipping, tripping and stumbling without subsequent striking against object, initial encounter: Secondary | ICD-10-CM | POA: Diagnosis not present

## 2020-08-10 DIAGNOSIS — Z90722 Acquired absence of ovaries, bilateral: Secondary | ICD-10-CM | POA: Diagnosis not present

## 2020-08-10 DIAGNOSIS — Y92512 Supermarket, store or market as the place of occurrence of the external cause: Secondary | ICD-10-CM | POA: Diagnosis not present

## 2020-08-10 DIAGNOSIS — Z170 Estrogen receptor positive status [ER+]: Secondary | ICD-10-CM | POA: Diagnosis not present

## 2020-08-10 DIAGNOSIS — L988 Other specified disorders of the skin and subcutaneous tissue: Secondary | ICD-10-CM | POA: Insufficient documentation

## 2020-08-10 DIAGNOSIS — C50411 Malignant neoplasm of upper-outer quadrant of right female breast: Secondary | ICD-10-CM | POA: Diagnosis not present

## 2020-08-10 DIAGNOSIS — Z801 Family history of malignant neoplasm of trachea, bronchus and lung: Secondary | ICD-10-CM | POA: Insufficient documentation

## 2020-08-10 NOTE — Progress Notes (Signed)
[facility] Follow up:    Brooke Lima, MD [place] [address]   DIAGNOSIS: Cancer Staging Malignant neoplasm of upper-outer quadrant of right breast in female, estrogen receptor positive ([place]) Staging form: Breast, AJCC 7th Edition - Clinical: Stage IIA (T2, N0, cM0) - Unsigned Specimen type: Core Needle Biopsy Laterality: Right Staging comments: Staged at breast conference 12.11.13  - Pathologic: No stage assigned - Unsigned Specimen type: Core Needle Biopsy Laterality: Right   SUMMARY OF ONCOLOGIC HISTORY:  (1) status post right breast biopsy on 03/11/2012 for an invasive ductal carcinoma with tubular features, grade 2, ER positive, PR positive, HER-2/neu negative, Ki-67 20%  (2) status post definitive right lumpectomy and sentinel lymph node biopsy on01/11/2012. 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 One sentinel node had isolated tumor cells. All margins clean.  (a) in the 2018 calcification her tumor would be stage IB  (3) Oncotype DX performed. Her score was 10, giving her a 7% risk ofoutside the breastrecurrence if her only systemic therapy istamoxifen for 5 years  (4) She completed radiation therapy in April 2015  (5) She began tamoxifen in May 2014completing 5 years April 2019  (6)status post hysterectomy with left salpingo-oophorectomy January 22, 2017, with benign pathology CURRENT THERAPY: observation/survivorship  INTERVAL HISTORY: Brooke Wood 55 y.o. female returns for evaluation of a small black bump on her left leg that she noticed about a week ago.  She is feeling well today.  She denies any pain at the leg or associated redness.  She is unsure what it is from.    She also tripped and fell into the grocery cart and hit her breasts and would like a breast exam to make sure everything looks ok.  She  is asking for a handout on what types of food to eat.     Patient Active Problem List   Diagnosis Date Noted  . Lipoma of arm 05/29/2020  . Onychomycosis of toenail 03/16/2020  . Bilateral hearing loss 08/16/2019  . Genetic testing 05/03/2019  . Carcinoma of breast ([place]) 02/19/2019  . 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  . Trochanteric bursitis of left hip 08/11/2018  . Type II diabetes mellitus with manifestations ([place]) 07/02/2018  . 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  . Abnormal electrocardiogram (ECG) (EKG) 10/08/2017  . Lymphedema of upper extremity 11/10/2014  . Hx of adenomatous colonic polyps 03/03/2014  . Routine general medical examination at a health care facility 06/03/2013  . Hyperlipidemia with target LDL less than 130 11/24/2012  . Malignant neoplasm of upper-outer quadrant of right breast in female, estrogen receptor positive ([place]) 03/06/2012  . Goiter 07/26/2009  . Essential hypertension, benign 01/19/2007  . Allergic rhinitis 01/19/2007    is allergic to aspirin and codeine.  MEDICAL HISTORY: Past Medical History:  Diagnosis Date  . Allergy    Neg RAST 2007  . Breast cancer ([place]) 2014   right breast  . Difficult airway for intubation 02/19/2019  . Hx of adenomatous colonic polyps 03/03/2014   repeat 2022  . Hx of radiation therapy 06/15/12 - 07/29/12   right rbeast, 50.4 Gy x 28 fx, boost to cumulative dose 60.4 gray  . Hypertension   . Personal history of radiation therapy 2014  . PONV (postoperative nausea and vomiting)   . SVD (spontaneous vaginal delivery)    x 1  . Vocal cord dysfunction  Neg Methacholine challenge test 2007    SURGICAL HISTORY: Past Surgical History:  Procedure Laterality Date  . BREAST LUMPECTOMY Right 2014  . BREAST LUMPECTOMY WITH SENTINEL LYMPH NODE BIOPSY  04/09/2012   Procedure: BREAST LUMPECTOMY WITH SENTINEL LYMPH NODE BX;  Surgeon: Merrie Roof, MD;  Location: [facility];  Service: General;  Laterality:  Right;  . BREAST SURGERY  1985   rt br bx  . COLONOSCOPY  03/2014   poylps  . CYSTOSCOPY N/A 01/22/2017   Procedure: CYSTOSCOPY;  Surgeon: Bobbye Charleston, MD;  Location: [facility];  Service: Gynecology;  Laterality: N/A;  . ROBOTIC ASSISTED TOTAL HYSTERECTOMY WITH BILATERAL SALPINGO OOPHERECTOMY Bilateral 01/22/2017   Procedure: ROBOTIC ASSISTED TOTAL HYSTERECTOMY WITH BILATERAL SALPINGECTOMY AND LEFT OOPHORECTOMY;  Surgeon: Bobbye Charleston, MD;  Location: [facility];  Service: Gynecology;  Laterality: Bilateral;    SOCIAL HISTORY: Social History   Socioeconomic History  . Marital status: Single    Spouse name: Not on file  . Number of children: Not on file  . Years of education: Not on file  . Highest education level: Not on file  Occupational History    Employer: UNEMPLOYED    Comment: [employer]  Tobacco Use  . Smoking status: Never Smoker  . Smokeless tobacco: Never Used  Vaping Use  . Vaping Use: Never used  Substance and Sexual Activity  . Alcohol use: No  . Drug use: No  . Sexual activity: Not Currently    Birth control/protection: None  Other Topics Concern  . Not on file  Social History Narrative   Regular exercise-No   Social Determinants of Health   Financial Resource Strain: Not on file  Food Insecurity: Not on file  Transportation Needs: Not on file  Physical Activity: Not on file  Stress: Not on file  Social Connections: Not on file  Intimate Partner Violence: Not on file    FAMILY HISTORY: Family History  Problem Relation Age of Onset  . Lung cancer Father 25       smoker  . Cancer Father        lung  . Hypertension Other   . Alcohol abuse Neg Hx   . Diabetes Neg Hx   . Early death Neg Hx   . Hearing loss Neg Hx   . Heart disease Neg Hx   . Hyperlipidemia Neg Hx   . Stroke Neg Hx   . Kidney disease Neg Hx   . Colon cancer Neg Hx   . Breast cancer Neg Hx     Review of Systems  Constitutional: Negative for appetite change, chills,  fatigue, fever and unexpected weight change.  HENT:   Negative for hearing loss, lump/mass and trouble swallowing.   Eyes: Negative for eye problems and icterus.  Respiratory: Negative for chest tightness, cough and shortness of breath.   Cardiovascular: Negative for chest pain, leg swelling and palpitations.  Gastrointestinal: Negative for abdominal distention, abdominal pain, constipation, diarrhea, nausea and vomiting.  Endocrine: Negative for hot flashes.  Genitourinary: Negative for difficulty urinating.   Musculoskeletal: Negative for arthralgias.  Skin: Negative for itching and rash.  Neurological: Negative for dizziness, extremity weakness, headaches and numbness.  Hematological: Negative for adenopathy. Does not bruise/bleed easily.  Psychiatric/Behavioral: Negative for depression. The patient is not nervous/anxious.       PHYSICAL EXAMINATION  ECOG PERFORMANCE STATUS: 1 - Symptomatic but completely ambulatory  Vitals:   08/10/20 1513  BP: 128/72  Pulse: 73  Resp: 18  Temp: (!) 97.4 F (36.3 C)  SpO2: 100%    Physical Exam Constitutional:      General: She is not in acute distress.    Appearance: Normal appearance. She is not toxic-appearing.  HENT:     Head: Normocephalic and atraumatic.  Eyes:     General: No scleral icterus. Cardiovascular:     Rate and Rhythm: Normal rate and regular rhythm.     Pulses: Normal pulses.     Heart sounds: Normal heart sounds.  Pulmonary:     Effort: Pulmonary effort is normal.     Breath sounds: Normal breath sounds.     Comments: Right breast s/p lumpectomy and radiation, no sign of local recurrence, left breast benign Abdominal:     General: Abdomen is flat. Bowel sounds are normal. There is no distension.     Palpations: Abdomen is soft.     Tenderness: There is no abdominal tenderness.  Musculoskeletal:        General: No swelling.     Cervical back: Neck supple.  Lymphadenopathy:     Cervical: No cervical  adenopathy.  Skin:    General: Skin is warm and dry.     Findings: No rash.     Comments: Small 83m well circumscribed area that appears to be a small scab.  Neurological:     General: No focal deficit present.     Mental Status: She is alert.  Psychiatric:        Mood and Affect: Mood normal.        Behavior: Behavior normal.     LABORATORY DATA:  CBC    Component Value Date/Time   WBC 6.0 08/16/2019 0946   RBC 4.69 08/16/2019 0946   HGB 13.6 08/16/2019 0946   HGB 13.0 06/12/2017 0946   HGB 14.0 05/16/2016 1005   HCT 40.8 08/16/2019 0946   HCT 42.2 05/16/2016 1005   PLT 219.0 08/16/2019 0946   PLT 218 06/12/2017 0946   PLT 191 05/16/2016 1005   MCV 87.0 08/16/2019 0946   MCV 87.6 05/16/2016 1005   MCH 28.6 06/12/2017 0946   MCHC 33.4 08/16/2019 0946   RDW 13.9 08/16/2019 0946   RDW 13.4 05/16/2016 1005   LYMPHSABS 2.8 08/16/2019 0946   LYMPHSABS 2.1 05/16/2016 1005   MONOABS 0.6 08/16/2019 0946   MONOABS 0.6 05/16/2016 1005   EOSABS 0.1 08/16/2019 0946   EOSABS 0.1 05/16/2016 1005   BASOSABS 0.0 08/16/2019 0946   BASOSABS 0.0 05/16/2016 1005    CMP     Component Value Date/Time   NA 138 03/20/2020 0916   NA 138 05/16/2016 1006   K 3.6 03/20/2020 0916   K 3.4 (L) 05/16/2016 1006   CL 101 03/20/2020 0916   CL 98 07/24/2012 1248   CO2 30 03/20/2020 0916   CO2 27 05/16/2016 1006   GLUCOSE 89 03/20/2020 0916   GLUCOSE 115 05/16/2016 1006   GLUCOSE 158 (H) 07/24/2012 1248   BUN 15 03/20/2020 0916   BUN 17.3 05/16/2016 1006   CREATININE 0.65 03/20/2020 0916   CREATININE 0.67 06/12/2017 0946   CREATININE 0.7 05/16/2016 1006   CALCIUM 9.7 03/20/2020 0916   CALCIUM 9.7 05/16/2016 1006   PROT 7.3 08/16/2019 0946   PROT 7.2 05/16/2016 1006   ALBUMIN 4.6 08/16/2019 0946   ALBUMIN 3.8 05/16/2016 1006   AST 13 08/16/2019 0946   AST 11 06/12/2017 0946   AST 12 05/16/2016 1006   ALT 18 08/16/2019 0946   ALT  15 06/12/2017 0946   ALT 15 05/16/2016 1006    ALKPHOS 146 (H) 08/16/2019 0946   ALKPHOS 98 05/16/2016 1006   BILITOT 0.5 08/16/2019 0946   BILITOT 0.4 06/12/2017 0946   BILITOT 0.25 05/16/2016 1006   GFRNONAA >60 06/12/2017 0946   GFRAA >60 06/12/2017 0946     ASSESSMENT and THERAPY PLAN:   Malignant neoplasm of upper-outer quadrant of right breast in female, estrogen receptor positive ([facility]) Brooke Wood is a 55 year old woman with history of stage IIA ER positive breast cancer diagnosed in 03/2012 treated with lumpectomy, adjuvant radiation therapy, and 5 years of tamoxifen completed in 07/2017.    1. History of breast cancer: no clinical or radiographic sign of recurrence.  She was recommended to stay up to date with her mammograms.  Her breasts are likely sore from the fall into the shopping cart, and will take some time to heal.  I reviewed this with her.  2. Skin lesion.  I recommended warm compresses.  This appears to be a very tiny scab around a hair follicle.  Should that not work she will let me know and I will send her to dermatology.   3. Dietary recommendations: I printed her out a mediterranean diet to eat and we reviewed it page by page, and I gave her examples of foods she might like, foods that can be expensive, and foods that are difficult to find at the local grocery store.  After our detailed discussion, she has a good idea of what she plans on doing when it time to go to the grocery store again.     All questions were answered. The patient knows to call the clinic with any problems, questions or concerns. We can certainly see the patient much sooner if necessary.  Total encounter time: 30 minutes*  A majority of the encounter time was spent face to face discussing her dietary recommendations and reading them page for page, additional time was spent in face to face time on the first two bullet points, and in documenting the encounter.   Wilber Bihari, NP 08/11/20 8:31 AM Medical Oncology and Hematology [facility] [place], [place] [address] Tel. [phone]    Fax. [phone]  *Total Encounter Time as defined by the Centers for Medicare and Medicaid Services includes, in addition to the face-to-face time of a patient visit (documented in the note above) non-face-to-face time: obtaining and reviewing outside history, ordering and reviewing medications, tests or procedures, care coordination (communications with other health care professionals or caregivers) and documentation in the medical record.

## 2020-08-11 ENCOUNTER — Encounter: Payer: Self-pay | Admitting: Adult Health

## 2020-08-11 ENCOUNTER — Ambulatory Visit: Payer: BC Managed Care – PPO | Admitting: Adult Health

## 2020-08-11 NOTE — Assessment & Plan Note (Signed)
Brooke Wood is a 55 year old woman with history of stage IIA ER positive breast cancer diagnosed in 03/2012 treated with lumpectomy, adjuvant radiation therapy, and 5 years of tamoxifen completed in 07/2017.    1. History of breast cancer: no clinical or radiographic sign of recurrence.  She was recommended to stay up to date with her mammograms.  Her breasts are likely sore from the fall into the shopping cart, and will take some time to heal.  I reviewed this with her.  2. Skin lesion.  I recommended warm compresses.  This appears to be a very tiny scab around a hair follicle.  Should that not work she will let me know and I will send her to dermatology.   3. Dietary recommendations: I printed her out a mediterranean diet to eat and we reviewed it page by page, and I gave her examples of foods she might like, foods that can be expensive, and foods that are difficult to find at the local grocery store.  After our detailed discussion, she has a good idea of what she plans on doing when it time to go to the grocery store again.

## 2020-08-16 ENCOUNTER — Other Ambulatory Visit (HOSPITAL_COMMUNITY): Payer: Self-pay

## 2020-08-16 MED FILL — Valsartan-Hydrochlorothiazide Tab 160-25 MG: ORAL | 30 days supply | Qty: 30 | Fill #1 | Status: AC

## 2020-08-16 MED FILL — Rosuvastatin Calcium Tab 5 MG: ORAL | 30 days supply | Qty: 30 | Fill #1 | Status: AC

## 2020-09-19 ENCOUNTER — Other Ambulatory Visit (HOSPITAL_COMMUNITY): Payer: Self-pay

## 2020-09-19 MED FILL — Rosuvastatin Calcium Tab 5 MG: ORAL | 30 days supply | Qty: 30 | Fill #2 | Status: AC

## 2020-09-19 MED FILL — Valsartan-Hydrochlorothiazide Tab 160-25 MG: ORAL | 30 days supply | Qty: 30 | Fill #2 | Status: AC

## 2020-09-26 NOTE — Telephone Encounter (Signed)
error 

## 2020-10-17 ENCOUNTER — Other Ambulatory Visit: Payer: Self-pay | Admitting: Internal Medicine

## 2020-10-17 ENCOUNTER — Other Ambulatory Visit (HOSPITAL_COMMUNITY): Payer: Self-pay

## 2020-10-17 DIAGNOSIS — E785 Hyperlipidemia, unspecified: Secondary | ICD-10-CM

## 2020-10-17 DIAGNOSIS — I10 Essential (primary) hypertension: Secondary | ICD-10-CM

## 2020-10-17 MED ORDER — ROSUVASTATIN CALCIUM 5 MG PO TABS
ORAL_TABLET | Freq: Every day | ORAL | 1 refills | Status: DC
  Filled 2020-10-17: qty 30, 30d supply, fill #0

## 2020-10-17 MED ORDER — VALSARTAN-HYDROCHLOROTHIAZIDE 160-25 MG PO TABS
1.0000 | ORAL_TABLET | Freq: Every day | ORAL | 1 refills | Status: DC
  Filled 2020-10-17: qty 30, 30d supply, fill #0
  Filled 2020-11-06: qty 30, 30d supply, fill #1

## 2020-11-06 ENCOUNTER — Other Ambulatory Visit (HOSPITAL_COMMUNITY): Payer: Self-pay

## 2020-11-08 ENCOUNTER — Telehealth: Payer: Self-pay | Admitting: Internal Medicine

## 2020-11-08 ENCOUNTER — Telehealth: Payer: Self-pay

## 2020-11-08 NOTE — Telephone Encounter (Signed)
    Patient calling to report she is having heel pain. She plans to call Ortho team . She is seeking advice for pain  Appointment scheduled  8/15 with Dr Sharlet Salina, patient can only do Monday appointments

## 2020-11-08 NOTE — Telephone Encounter (Signed)
Pt called stating that she is in extreme pain and would like to know if she can get in sooner than the 22nd to see Dr. Ninfa Linden

## 2020-11-10 ENCOUNTER — Other Ambulatory Visit: Payer: Self-pay

## 2020-11-10 ENCOUNTER — Ambulatory Visit: Payer: Self-pay

## 2020-11-10 ENCOUNTER — Encounter: Payer: Self-pay | Admitting: Family Medicine

## 2020-11-10 ENCOUNTER — Ambulatory Visit (INDEPENDENT_AMBULATORY_CARE_PROVIDER_SITE_OTHER): Payer: BC Managed Care – PPO | Admitting: Family Medicine

## 2020-11-10 DIAGNOSIS — M79672 Pain in left foot: Secondary | ICD-10-CM

## 2020-11-10 NOTE — Patient Instructions (Signed)
     For bone health:  Vitamin D3:  Take 5000 IU daily  Vitamin K2:  Take 100 mcg daily

## 2020-11-10 NOTE — Progress Notes (Signed)
Office Visit Note   Patient: Brooke Wood           Date of Birth: 05/18/1965           MRN: [id number] Visit Date: 11/10/2020 Requested by: [name], MD [address] [place],  [place] [id number] PCP: [name], MD  Subjective: Chief Complaint  Patient presents with   Left Foot - Pain    NKI. Pain in the plantar aspect of the foot, starting at the heel and moving up the foot. Started 3-4 weeks ago. Hurts badly with the first step out of bed in the morning. Works at [employer], so she is on her feet a lot on that cement floor.    HPI: She is here with left heel pain.  Symptoms started 3 or 4 weeks ago, no injury.  Pain on the plantar aspect when she first gets out of bed, and it continues to hurt throughout the day.  No numbness or tingling, no change in her activities to account for this.  She works at [employer] on hard floors and has been doing this for many years.  No history of stress fracture.  She has not tried any medications for this.                ROS:   All other systems were reviewed and are negative.  Objective: Vital Signs: LMP 01/07/2016 (Approximate)   Physical Exam:  General:  Alert and oriented, in no acute distress. Pulm:  Breathing unlabored. Psy:  Normal mood, congruent affect. Skin: No erythema Left heel: She has tight hamstrings and heel cords.  There is quite a bit of tenderness at the medial origin of the plantar fascia.  No pain with flexion of the toes against resistance or supination of the foot.  No pain with medial/lateral calcaneal squeeze.    Imaging: XR Os Calcis Left  Result Date: 11/10/2020 X-rays reveal a very early traction spur at the plantar fascial origin on the calcaneus.  There is a similar spur at the Achilles insertion posteriorly.  No sign of stress fracture.   Assessment & Plan: Left heel plantar fasciitis related to inflexibility -Gel heel pads.  Ice water soaks.  Plantar fascia and hamstring flexibility  exercises.  Cortisone injection if symptoms persist.  Out of work until Tuesday.     Procedures: No procedures performed        PMFS History: Patient Active Problem List   Diagnosis Date Noted   Lipoma of arm 05/29/2020   Onychomycosis of toenail 03/16/2020   Bilateral hearing loss 08/16/2019   Genetic testing 05/03/2019   Carcinoma of breast ([place]) 02/19/2019   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   Trochanteric bursitis of left hip 08/11/2018   Type II diabetes mellitus with manifestations ([place]) 07/02/2018   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   Abnormal electrocardiogram (ECG) (EKG) 10/08/2017   Lymphedema of upper extremity 11/10/2014   Hx of adenomatous colonic polyps 03/03/2014   Routine general medical examination at a health care facility 06/03/2013   Hyperlipidemia with target LDL less than 130 11/24/2012   Malignant neoplasm of upper-outer quadrant of right breast in female, estrogen receptor positive ([place]) 03/06/2012   Goiter 07/26/2009   Essential hypertension, benign 01/19/2007   Allergic rhinitis 01/19/2007   Past Medical History:  Diagnosis Date   Allergy    Neg RAST 2007   Breast cancer ([place] [facility]) 2014   right breast   Difficult airway for intubation 02/19/2019  Hx of adenomatous colonic polyps 03/03/2014   repeat 2022   Hx of radiation therapy 06/15/12 - 07/29/12   right rbeast, 50.4 Gy x 28 fx, boost to cumulative dose 60.4 gray   Hypertension    Personal history of radiation therapy 2014   PONV (postoperative nausea and vomiting)    SVD (spontaneous vaginal delivery)    x 1   Vocal cord dysfunction    Neg Methacholine challenge test 2007    Family History  Problem Relation Age of Onset   Lung cancer Father 73       smoker   Cancer Father        lung   Hypertension Other    Alcohol abuse Neg Hx    Diabetes Neg Hx    Early death Neg Hx    Hearing loss Neg Hx    Heart disease Neg Hx    Hyperlipidemia Neg Hx    Stroke Neg Hx    Kidney disease Neg Hx     Colon cancer Neg Hx    Breast cancer Neg Hx     Past Surgical History:  Procedure Laterality Date   BREAST LUMPECTOMY Right 2014   BREAST LUMPECTOMY WITH SENTINEL LYMPH NODE BIOPSY  04/09/2012   Procedure: BREAST LUMPECTOMY WITH SENTINEL LYMPH NODE BX;  Surgeon: Merrie Roof, MD;  Location: [facility];  Service: General;  Laterality: Right;   [place]   rt br bx   COLONOSCOPY  03/2014   poylps   CYSTOSCOPY N/A 01/22/2017   Procedure: CYSTOSCOPY;  Surgeon: Bobbye Charleston, MD;  Location: [facility];  Service: Gynecology;  Laterality: N/A;   ROBOTIC ASSISTED TOTAL HYSTERECTOMY WITH BILATERAL SALPINGO OOPHERECTOMY Bilateral 01/22/2017   Procedure: ROBOTIC ASSISTED TOTAL HYSTERECTOMY WITH BILATERAL SALPINGECTOMY AND LEFT OOPHORECTOMY;  Surgeon: Bobbye Charleston, MD;  Location: [facility];  Service: Gynecology;  Laterality: Bilateral;   Social History   Occupational History    Employer: UNEMPLOYED    Comment: Radio broadcast assistant  Tobacco Use   Smoking status: Never   Smokeless tobacco: Never  Vaping Use   Vaping Use: Never used  Substance and Sexual Activity   Alcohol use: No   Drug use: No   Sexual activity: Not Currently    Birth control/protection: None

## 2020-11-13 ENCOUNTER — Ambulatory Visit: Payer: BC Managed Care – PPO | Admitting: Internal Medicine

## 2020-11-14 ENCOUNTER — Other Ambulatory Visit (HOSPITAL_COMMUNITY): Payer: Self-pay

## 2020-11-14 ENCOUNTER — Telehealth: Payer: Self-pay | Admitting: Family Medicine

## 2020-11-14 MED ORDER — DICLOFENAC SODIUM 1 % EX GEL
4.0000 g | Freq: Four times a day (QID) | CUTANEOUS | 6 refills | Status: DC | PRN
  Filled 2020-11-14: qty 500, 30d supply, fill #0

## 2020-11-14 NOTE — Telephone Encounter (Signed)
Patient called. She would like some medicated cream to rub on her foot. Says she is in a lot pain. Her call back number [phone]

## 2020-11-14 NOTE — Telephone Encounter (Signed)
Please advise 

## 2020-11-14 NOTE — Telephone Encounter (Signed)
I called and advised the patient. It was sent to [facility]. She is planning to switch to [facility], but asked me to leave the Rx where it is right now. I changed the pharmacy preference in the chart, for the future.

## 2020-11-22 ENCOUNTER — Other Ambulatory Visit (HOSPITAL_COMMUNITY): Payer: Self-pay

## 2020-11-24 ENCOUNTER — Ambulatory Visit: Payer: BC Managed Care – PPO | Admitting: Family Medicine

## 2020-11-27 ENCOUNTER — Ambulatory Visit (INDEPENDENT_AMBULATORY_CARE_PROVIDER_SITE_OTHER): Payer: BC Managed Care – PPO | Admitting: Orthopaedic Surgery

## 2020-11-27 ENCOUNTER — Other Ambulatory Visit: Payer: Self-pay

## 2020-11-27 ENCOUNTER — Encounter: Payer: Self-pay | Admitting: Orthopaedic Surgery

## 2020-11-27 DIAGNOSIS — M722 Plantar fascial fibromatosis: Secondary | ICD-10-CM

## 2020-11-27 DIAGNOSIS — M79672 Pain in left foot: Secondary | ICD-10-CM | POA: Diagnosis not present

## 2020-11-27 MED ORDER — LIDOCAINE HCL 1 % IJ SOLN
1.0000 mL | INTRAMUSCULAR | Status: AC | PRN
  Administered 2020-11-27: 1 mL

## 2020-11-27 MED ORDER — METHYLPREDNISOLONE ACETATE 40 MG/ML IJ SUSP
40.0000 mg | INTRAMUSCULAR | Status: AC | PRN
  Administered 2020-11-27: 40 mg

## 2020-11-27 NOTE — Progress Notes (Signed)
Office Visit Note   Patient: Brooke Wood           Date of Birth: 17-Apr-1965           MRN: [id number] Visit Date: 11/27/2020              Requested by: [name], MD [address] [place],  [place] [address] PCP: [name], MD   Assessment & Plan: Visit Diagnoses:  1. Pain in left foot   2. Plantar fasciitis of left foot     Plan: I recommended a steroid injection over the point of maximum tenderness of her left heel and described by we are doing this.  She agreed to this and tolerated it well.  I am also going to have her try modified shoewear and have her try topical Voltaren gel 2-3 times daily over this area.  We can see her back in a month to see how she is doing overall.  Follow-Up Instructions: Return in about 4 weeks (around 12/25/2020).   Orders:  Orders Placed This Encounter  Procedures   Foot Inj    No orders of the defined types were placed in this encounter.     Procedures: Foot Inj  Date/Time: 11/27/2020 9:54 AM Performed by: Mcarthur Rossetti, MD Authorized by: Mcarthur Rossetti, MD   Condition: Plantar Fasciitis   Location: left plantar fascia muscle   Medications:  1 mL lidocaine 1 %; 40 mg methylPREDNISolone acetate 40 MG/ML   Clinical Data: No additional findings.   Subjective: Chief Complaint  Patient presents with   Left Foot - Follow-up  The patient is someone I am seeing for the first time but she is an established patient who saw Dr. Junius Roads recently for left foot and heel pain.  He had recommended stretching exercises and modified shoewear.  X-rays were negative for any type of fracture.  She has a lot of pain when she first gets up in the morning and take steps on her left foot.  Conservative treatment thus far has not helped.  She has not had any type of injection.  HPI  Review of Systems There is currently listed no fever, chills, nausea, vomiting  Objective: Vital Signs: LMP 01/07/2016 (Approximate)    Physical Exam She is alert and orient x3 and in no acute distress Ortho Exam Examination of her left foot shows normal arch and normal contours.  Foot is well-perfused.  Her pain is along the plantar fascial at the heel.  Her Achilles is intact. Specialty Comments:  No specialty comments available.  Imaging: No results found.   PMFS History: Patient Active Problem List   Diagnosis Date Noted   Lipoma of arm 05/29/2020   Onychomycosis of toenail 03/16/2020   Bilateral hearing loss 08/16/2019   Genetic testing 05/03/2019   Carcinoma of breast ([facility]) 02/19/2019   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   Trochanteric bursitis of left hip 08/11/2018   Type II diabetes mellitus with manifestations ([place]) 07/02/2018   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   Abnormal electrocardiogram (ECG) (EKG) 10/08/2017   Lymphedema of upper extremity 11/10/2014   Hx of adenomatous colonic polyps 03/03/2014   Routine general medical examination at a health care facility 06/03/2013   Hyperlipidemia with target LDL less than 130 11/24/2012   Malignant neoplasm of upper-outer quadrant of right breast in female, estrogen receptor positive ([place]) 03/06/2012   Goiter 07/26/2009   Essential hypertension, benign 01/19/2007   Allergic rhinitis 01/19/2007   Past  Medical History:  Diagnosis Date   Allergy    Neg RAST 2007   Breast cancer ([place]) 2014   right breast   Difficult airway for intubation 02/19/2019   Hx of adenomatous colonic polyps 03/03/2014   repeat 2022   Hx of radiation therapy 06/15/12 - 07/29/12   right rbeast, 50.4 Gy x 28 fx, boost to cumulative dose 60.4 gray   Hypertension    Personal history of radiation therapy 2014   PONV (postoperative nausea and vomiting)    SVD (spontaneous vaginal delivery)    x 1   Vocal cord dysfunction    Neg Methacholine challenge test 2007    Family History  Problem Relation Age of Onset   Lung cancer Father 31       smoker   Cancer Father        lung    Hypertension Other    Alcohol abuse Neg Hx    Diabetes Neg Hx    Early death Neg Hx    Hearing loss Neg Hx    Heart disease Neg Hx    Hyperlipidemia Neg Hx    Stroke Neg Hx    Kidney disease Neg Hx    Colon cancer Neg Hx    Breast cancer Neg Hx     Past Surgical History:  Procedure Laterality Date   BREAST LUMPECTOMY Right 2014   BREAST LUMPECTOMY WITH SENTINEL LYMPH NODE BIOPSY  04/09/2012   Procedure: BREAST LUMPECTOMY WITH SENTINEL LYMPH NODE BX;  Surgeon: Merrie Roof, MD;  Location: [facility];  Service: General;  Laterality: Right;   [place]   rt br bx   COLONOSCOPY  03/2014   poylps   CYSTOSCOPY N/A 01/22/2017   Procedure: CYSTOSCOPY;  Surgeon: Bobbye Charleston, MD;  Location: [facility];  Service: Gynecology;  Laterality: N/A;   ROBOTIC ASSISTED TOTAL HYSTERECTOMY WITH BILATERAL SALPINGO OOPHERECTOMY Bilateral 01/22/2017   Procedure: ROBOTIC ASSISTED TOTAL HYSTERECTOMY WITH BILATERAL SALPINGECTOMY AND LEFT OOPHORECTOMY;  Surgeon: Bobbye Charleston, MD;  Location: [facility];  Service: Gynecology;  Laterality: Bilateral;   Social History   Occupational History    Employer: UNEMPLOYED    Comment: Radio broadcast assistant  Tobacco Use   Smoking status: Never   Smokeless tobacco: Never  Vaping Use   Vaping Use: Never used  Substance and Sexual Activity   Alcohol use: No   Drug use: No   Sexual activity: Not Currently    Birth control/protection: None

## 2020-11-30 ENCOUNTER — Other Ambulatory Visit (HOSPITAL_COMMUNITY): Payer: Self-pay

## 2020-12-14 DIAGNOSIS — Z01419 Encounter for gynecological examination (general) (routine) without abnormal findings: Secondary | ICD-10-CM | POA: Diagnosis not present

## 2020-12-25 ENCOUNTER — Other Ambulatory Visit: Payer: Self-pay

## 2020-12-25 ENCOUNTER — Ambulatory Visit (INDEPENDENT_AMBULATORY_CARE_PROVIDER_SITE_OTHER): Payer: BC Managed Care – PPO | Admitting: Orthopaedic Surgery

## 2020-12-25 ENCOUNTER — Encounter: Payer: Self-pay | Admitting: Orthopaedic Surgery

## 2020-12-25 DIAGNOSIS — M79672 Pain in left foot: Secondary | ICD-10-CM

## 2020-12-25 DIAGNOSIS — M722 Plantar fascial fibromatosis: Secondary | ICD-10-CM | POA: Diagnosis not present

## 2020-12-25 NOTE — Progress Notes (Signed)
The patient comes in today with continued left foot pain.  We tried a steroid injection over the plantar fashion she said this did not help her at all.  She is 55 years old and works on her feet all day long.  Her Achilles is intact on the left side.  The foot has normal contours and normal arch but is painful on the arch and the plantar fascial.  X-rays are negative for any type of fracture or worrisome findings.  She does have slight flattening of her arch.  At this point we will have her try a cam walking boot or a night splint to wear at night in bed that keeps her foot on stretch.  I would like to send her to outpatient physical therapy here at [facility] to see if there is any modalities that they can try that will help her left foot and heel pain calm down.  She agrees with this treatment plan.  We will see her back in a month.

## 2020-12-26 NOTE — Addendum Note (Signed)
Addended by: Robyne Peers on: 12/26/2020 10:59 AM   Modules accepted: Orders

## 2021-01-04 ENCOUNTER — Telehealth: Payer: Self-pay | Admitting: Orthopaedic Surgery

## 2021-01-04 NOTE — Telephone Encounter (Signed)
Pt wondering what to do if you have calais on ur feet? If she don't answer she says leave it on her VM.  CB [phone]

## 2021-01-04 NOTE — Telephone Encounter (Signed)
Lvm for pt to cb to inform 

## 2021-01-08 ENCOUNTER — Ambulatory Visit: Payer: BC Managed Care – PPO | Admitting: Physical Therapy

## 2021-01-22 ENCOUNTER — Ambulatory Visit: Payer: BC Managed Care – PPO | Admitting: Internal Medicine

## 2021-01-22 ENCOUNTER — Ambulatory Visit: Payer: BC Managed Care – PPO | Admitting: Physical Therapy

## 2021-01-25 DIAGNOSIS — Z6825 Body mass index (BMI) 25.0-25.9, adult: Secondary | ICD-10-CM | POA: Diagnosis not present

## 2021-01-25 DIAGNOSIS — N644 Mastodynia: Secondary | ICD-10-CM | POA: Diagnosis not present

## 2021-01-29 ENCOUNTER — Ambulatory Visit: Payer: BC Managed Care – PPO | Admitting: Orthopaedic Surgery

## 2021-02-01 ENCOUNTER — Other Ambulatory Visit: Payer: Self-pay | Admitting: Obstetrics and Gynecology

## 2021-02-01 DIAGNOSIS — N644 Mastodynia: Secondary | ICD-10-CM

## 2021-02-08 ENCOUNTER — Inpatient Hospital Stay: Payer: BC Managed Care – PPO

## 2021-02-08 ENCOUNTER — Inpatient Hospital Stay: Payer: BC Managed Care – PPO | Attending: Adult Health | Admitting: Adult Health

## 2021-02-08 ENCOUNTER — Other Ambulatory Visit: Payer: Self-pay

## 2021-02-08 ENCOUNTER — Encounter: Payer: Self-pay | Admitting: Adult Health

## 2021-02-08 ENCOUNTER — Telehealth: Payer: Self-pay

## 2021-02-08 VITALS — BP 115/84 | HR 82 | Temp 97.8°F | Resp 18 | Ht 66.0 in | Wt 158.0 lb

## 2021-02-08 DIAGNOSIS — I10 Essential (primary) hypertension: Secondary | ICD-10-CM | POA: Diagnosis not present

## 2021-02-08 DIAGNOSIS — C50411 Malignant neoplasm of upper-outer quadrant of right female breast: Secondary | ICD-10-CM

## 2021-02-08 DIAGNOSIS — Z801 Family history of malignant neoplasm of trachea, bronchus and lung: Secondary | ICD-10-CM | POA: Insufficient documentation

## 2021-02-08 DIAGNOSIS — Z170 Estrogen receptor positive status [ER+]: Secondary | ICD-10-CM | POA: Insufficient documentation

## 2021-02-08 DIAGNOSIS — Z853 Personal history of malignant neoplasm of breast: Secondary | ICD-10-CM | POA: Insufficient documentation

## 2021-02-08 DIAGNOSIS — E119 Type 2 diabetes mellitus without complications: Secondary | ICD-10-CM | POA: Diagnosis not present

## 2021-02-08 LAB — CBC WITH DIFFERENTIAL (CANCER CENTER ONLY)
Abs Immature Granulocytes: 0.01 10*3/uL (ref 0.00–0.07)
Basophils Absolute: 0 10*3/uL (ref 0.0–0.1)
Basophils Relative: 1 %
Eosinophils Absolute: 0.1 10*3/uL (ref 0.0–0.5)
Eosinophils Relative: 1 %
HCT: 41.8 % (ref 36.0–46.0)
Hemoglobin: 13.5 g/dL (ref 12.0–15.0)
Immature Granulocytes: 0 %
Lymphocytes Relative: 44 %
Lymphs Abs: 2.5 10*3/uL (ref 0.7–4.0)
MCH: 28.6 pg (ref 26.0–34.0)
MCHC: 32.3 g/dL (ref 30.0–36.0)
MCV: 88.6 fL (ref 80.0–100.0)
Monocytes Absolute: 0.5 10*3/uL (ref 0.1–1.0)
Monocytes Relative: 9 %
Neutro Abs: 2.6 10*3/uL (ref 1.7–7.7)
Neutrophils Relative %: 45 %
Platelet Count: 224 10*3/uL (ref 150–400)
RBC: 4.72 MIL/uL (ref 3.87–5.11)
RDW: 13.6 % (ref 11.5–15.5)
WBC Count: 5.7 10*3/uL (ref 4.0–10.5)
nRBC: 0 % (ref 0.0–0.2)

## 2021-02-08 LAB — CMP (CANCER CENTER ONLY)
ALT: 15 U/L (ref 0–44)
AST: 12 U/L — ABNORMAL LOW (ref 15–41)
Albumin: 4.3 g/dL (ref 3.5–5.0)
Alkaline Phosphatase: 144 U/L — ABNORMAL HIGH (ref 38–126)
Anion gap: 10 (ref 5–15)
BUN: 21 mg/dL — ABNORMAL HIGH (ref 6–20)
CO2: 29 mmol/L (ref 22–32)
Calcium: 9.9 mg/dL (ref 8.9–10.3)
Chloride: 102 mmol/L (ref 98–111)
Creatinine: 0.72 mg/dL (ref 0.44–1.00)
GFR, Estimated: >60 mL/min (ref >60)
Glucose, Bld: 87 mg/dL (ref 70–99)
Potassium: 3.6 mmol/L (ref 3.5–5.1)
Sodium: 141 mmol/L (ref 135–145)
Total Bilirubin: 0.4 mg/dL (ref 0.3–1.2)
Total Protein: 7.5 g/dL (ref 6.5–8.1)

## 2021-02-08 NOTE — Progress Notes (Signed)
[facility] Follow up:    Brooke Lima, MD [place] [address]   DIAGNOSIS: Cancer Staging Malignant neoplasm of upper-outer quadrant of right breast in female, estrogen receptor positive ([place]) Staging form: Breast, AJCC 7th Edition - Clinical: Stage IIA (T2, N0, cM0) - Unsigned Specimen type: Core Needle Biopsy Laterality: Right Staging comments: Staged at breast conference 12.11.13  - Pathologic: No stage assigned - Unsigned Specimen type: Core Needle Biopsy Laterality: Right   SUMMARY OF ONCOLOGIC HISTORY:  (1) status post right breast biopsy on 03/11/2012 for an invasive ductal carcinoma with tubular features, grade 2, ER positive, PR positive, HER-2/neu negative, Ki-67 20%   (2) status post definitive right lumpectomy and sentinel lymph node biopsy on 04/09/2012. 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 One sentinel node had isolated tumor cells. All margins clean.              (a) in the 2018 calcification her tumor would be stage IB   (3) Oncotype DX performed. Her score was 10, giving her a 7% risk of outside the breast recurrence if her only systemic therapy is tamoxifen for 5 years   (4) She completed radiation therapy in April 2015   (5) She began tamoxifen in May 2014 completing 5 years April 2019   (6) status post hysterectomy with left salpingo-oophorectomy January 22, 2017, with benign pathology CURRENT THERAPY: observation/survivorship  INTERVAL HISTORY: Brooke Wood 55 y.o. female returns for evaluation of a small black bump on her left leg that she noticed about a week ago.   Brooke Wood's most recent mammogram was completed on May 25, 2020.  It showed no evidence of malignancy and breast density category C she was recommended to undergo repeat mammography in 1 year.  Brooke Wood notes some right chest wall pain that has been  present x 2 weeks.  She is unsure if she picked up something to pull that area.  Brooke Wood notes that her gynecologist ordered a mammogram however could not get it scheduled until December of this year.  She wants to know if there is any way to get this scheduled any sooner, however she notes that she does have limitations with her work schedule and is only able to get to Monday appointments.  She denies any other symptoms or breast concerns today.  She is active and eating well.    Patient Active Problem List   Diagnosis Date Noted   Lipoma of arm 05/29/2020   Onychomycosis of toenail 03/16/2020   Bilateral hearing loss 08/16/2019   Genetic testing 05/03/2019   Carcinoma of breast ([place]) 02/19/2019   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   Trochanteric bursitis of left hip 08/11/2018   Type II diabetes mellitus with manifestations ([place]) 07/02/2018   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   Abnormal electrocardiogram (ECG) (EKG) 10/08/2017   Lymphedema of upper extremity 11/10/2014   Hx of adenomatous colonic polyps 03/03/2014   Routine general medical examination at a health care facility 06/03/2013   Hyperlipidemia with target LDL less than 130 11/24/2012   Malignant neoplasm of upper-outer quadrant of right breast in female, estrogen receptor positive ([place]) 03/06/2012   Goiter 07/26/2009   Essential hypertension, benign 01/19/2007   Allergic rhinitis 01/19/2007    is allergic to aspirin and codeine.  MEDICAL HISTORY: Past Medical History:  Diagnosis Date   Allergy    Neg RAST 2007  Breast cancer ([place]) 2014   right breast   Difficult airway for intubation 02/19/2019   Hx of adenomatous colonic polyps 03/03/2014   repeat 2022   Hx of radiation therapy 06/15/12 - 07/29/12   right rbeast, 50.4 Gy x 28 fx, boost to cumulative dose 60.4 gray   Hypertension    Personal history of radiation therapy 2014   PONV (postoperative nausea and vomiting)    SVD (spontaneous vaginal delivery)    x 1    Vocal cord dysfunction    Neg Methacholine challenge test 2007    SURGICAL HISTORY: Past Surgical History:  Procedure Laterality Date   BREAST LUMPECTOMY Right 2014   BREAST LUMPECTOMY WITH SENTINEL LYMPH NODE BIOPSY  04/09/2012   Procedure: BREAST LUMPECTOMY WITH SENTINEL LYMPH NODE BX;  Surgeon: Merrie Roof, MD;  Location: [facility];  Service: General;  Laterality: Right;   [place]   rt br bx   COLONOSCOPY  03/2014   poylps   CYSTOSCOPY N/A 01/22/2017   Procedure: CYSTOSCOPY;  Surgeon: Bobbye Charleston, MD;  Location: [facility];  Service: Gynecology;  Laterality: N/A;   ROBOTIC ASSISTED TOTAL HYSTERECTOMY WITH BILATERAL SALPINGO OOPHERECTOMY Bilateral 01/22/2017   Procedure: ROBOTIC ASSISTED TOTAL HYSTERECTOMY WITH BILATERAL SALPINGECTOMY AND LEFT OOPHORECTOMY;  Surgeon: Bobbye Charleston, MD;  Location: [facility];  Service: Gynecology;  Laterality: Bilateral;    SOCIAL HISTORY: Social History   Socioeconomic History   Marital status: Single    Spouse name: Not on file   Number of children: Not on file   Years of education: Not on file   Highest education level: Not on file  Occupational History    Employer: UNEMPLOYED    Comment: [employer]  Tobacco Use   Smoking status: Never   Smokeless tobacco: Never  Vaping Use   Vaping Use: Never used  Substance and Sexual Activity   Alcohol use: No   Drug use: No   Sexual activity: Not Currently    Birth control/protection: None  Other Topics Concern   Not on file  Social History Narrative   Regular exercise-No   Social Determinants of Health   Financial Resource Strain: Not on file  Food Insecurity: Not on file  Transportation Needs: Not on file  Physical Activity: Not on file  Stress: Not on file  Social Connections: Not on file  Intimate Partner Violence: Not on file    FAMILY HISTORY: Family History  Problem Relation Age of Onset   Lung cancer Father 62       smoker   Cancer  Father        lung   Hypertension Other    Alcohol abuse Neg Hx    Diabetes Neg Hx    Early death Neg Hx    Hearing loss Neg Hx    Heart disease Neg Hx    Hyperlipidemia Neg Hx    Stroke Neg Hx    Kidney disease Neg Hx    Colon cancer Neg Hx    Breast cancer Neg Hx     Review of Systems  Constitutional:  Negative for appetite change, chills, fatigue, fever and unexpected weight change.  HENT:   Negative for hearing loss, lump/mass and trouble swallowing.   Eyes:  Negative for eye problems and icterus.  Respiratory:  Negative for chest tightness, cough and shortness of breath.   Cardiovascular:  Negative for chest pain, leg swelling and palpitations.  Gastrointestinal:  Negative for abdominal distention, abdominal  pain, constipation, diarrhea, nausea and vomiting.  Endocrine: Negative for hot flashes.  Genitourinary:  Negative for difficulty urinating.   Musculoskeletal:  Negative for arthralgias.  Skin:  Negative for itching and rash.  Neurological:  Negative for dizziness, extremity weakness, headaches and numbness.  Hematological:  Negative for adenopathy. Does not bruise/bleed easily.  Psychiatric/Behavioral:  Negative for depression. The patient is not nervous/anxious.      PHYSICAL EXAMINATION  ECOG PERFORMANCE STATUS: 1 - Symptomatic but completely ambulatory  Vitals:   02/08/21 1002  BP: 115/84  Pulse: 82  Resp: 18  Temp: 97.8 F (36.6 C)  SpO2: 100%    Physical Exam Constitutional:      General: She is not in acute distress.    Appearance: Normal appearance. She is not toxic-appearing.  HENT:     Head: Normocephalic and atraumatic.  Eyes:     General: No scleral icterus. Cardiovascular:     Rate and Rhythm: Normal rate and regular rhythm.     Pulses: Normal pulses.     Heart sounds: Normal heart sounds.  Pulmonary:     Effort: Pulmonary effort is normal.     Breath sounds: Normal breath sounds.     Comments: Right breast s/p lumpectomy and  radiation, no sign of local recurrence, left breast benign  Mild tenderness is noted over the right lower inner breast.  There is no tenderness over the seventh and eighth ribs. Abdominal:     General: Abdomen is flat. Bowel sounds are normal. There is no distension.     Palpations: Abdomen is soft.     Tenderness: There is no abdominal tenderness.  Musculoskeletal:        General: No swelling.     Cervical back: Neck supple.  Lymphadenopathy:     Cervical: No cervical adenopathy.  Skin:    General: Skin is warm and dry.     Findings: No rash.  Neurological:     General: No focal deficit present.     Mental Status: She is alert.  Psychiatric:        Mood and Affect: Mood normal.        Behavior: Behavior normal.    LABORATORY DATA:  CBC    Component Value Date/Time   WBC 5.7 02/08/2021 1149   WBC 6.0 08/16/2019 0946   RBC 4.72 02/08/2021 1149   HGB 13.5 02/08/2021 1149   HGB 14.0 05/16/2016 1005   HCT 41.8 02/08/2021 1149   HCT 42.2 05/16/2016 1005   PLT 224 02/08/2021 1149   PLT 191 05/16/2016 1005   MCV 88.6 02/08/2021 1149   MCV 87.6 05/16/2016 1005   MCH 28.6 02/08/2021 1149   MCHC 32.3 02/08/2021 1149   RDW 13.6 02/08/2021 1149   RDW 13.4 05/16/2016 1005   LYMPHSABS 2.5 02/08/2021 1149   LYMPHSABS 2.1 05/16/2016 1005   MONOABS 0.5 02/08/2021 1149   MONOABS 0.6 05/16/2016 1005   EOSABS 0.1 02/08/2021 1149   EOSABS 0.1 05/16/2016 1005   BASOSABS 0.0 02/08/2021 1149   BASOSABS 0.0 05/16/2016 1005    CMP     Component Value Date/Time   NA 141 02/08/2021 1149   NA 138 05/16/2016 1006   K 3.6 02/08/2021 1149   K 3.4 (L) 05/16/2016 1006   CL 102 02/08/2021 1149   CL 98 07/24/2012 1248   CO2 29 02/08/2021 1149   CO2 27 05/16/2016 1006   GLUCOSE 87 02/08/2021 1149   GLUCOSE 115 05/16/2016 1006   GLUCOSE 158 (  H) 07/24/2012 1248   BUN 21 (H) 02/08/2021 1149   BUN 17.3 05/16/2016 1006   CREATININE 0.72 02/08/2021 1149   CREATININE 0.7 05/16/2016 1006    CALCIUM 9.9 02/08/2021 1149   CALCIUM 9.7 05/16/2016 1006   PROT 7.5 02/08/2021 1149   PROT 7.2 05/16/2016 1006   ALBUMIN 4.3 02/08/2021 1149   ALBUMIN 3.8 05/16/2016 1006   AST 12 (L) 02/08/2021 1149   AST 12 05/16/2016 1006   ALT 15 02/08/2021 1149   ALT 15 05/16/2016 1006   ALKPHOS 144 (H) 02/08/2021 1149   ALKPHOS 98 05/16/2016 1006   BILITOT 0.4 02/08/2021 1149   BILITOT 0.25 05/16/2016 1006   GFRNONAA >60 02/08/2021 1149   GFRAA >60 06/12/2017 0946     ASSESSMENT and THERAPY PLAN:   Malignant neoplasm of upper-outer quadrant of right breast in female, estrogen receptor positive ([facility]) Brooke Wood is a 55 year old woman with history of stage IIA ER positive breast cancer diagnosed in 03/2012 treated with lumpectomy, adjuvant radiation therapy, and 5 years of tamoxifen completed in 07/2017.    1. History of breast cancer: Her most recent mammogram was normal.  I do not appreciate any abnormality in palpation on her physical exam today.  I am concerned about this new area of tenderness, and placed orders for diagnostic mammogram and ultrasound.  I also will order a CT chest.  Considering ordering the CT chest to evaluate for anything underlying considering her history of breast cancer, she will need kidney function test today to evaluate her creatinine..  2.  Health maintenance.  We reviewed healthy diet and exercise recommendations.  She will was recommended to continue to see primary care regularly and stay up-to-date with her cancer screenings.  Brooke Wood will return in 1 year for continued long-term follow-up.   All questions were answered. The patient knows to call the clinic with any problems, questions or concerns. We can certainly see the patient much sooner if necessary.  Total encounter time: 30 minutes in chart review, lab review, face-to-face visit time, order entry, care coordination, and documentation of the encounter.  Wilber Bihari, NP 02/08/21 3:35 PM Medical Oncology  and Hematology [facility] [address], [place] [address] Tel. [phone]    Fax. [phone]  *Total Encounter Time as defined by the Centers for Medicare and Medicaid Services includes, in addition to the face-to-face time of a patient visit (documented in the note above) non-face-to-face time: obtaining and reviewing outside history, ordering and reviewing medications, tests or procedures, care coordination (communications with other health care professionals or caregivers) and documentation in the medical record.

## 2021-02-08 NOTE — Addendum Note (Signed)
Addended by: Scot Dock on: 02/08/2021 03:37 PM   Modules accepted: Orders

## 2021-02-08 NOTE — Telephone Encounter (Signed)
Called pt to confirm appointment at the breast center.  Pt verbalized understanding

## 2021-02-08 NOTE — Assessment & Plan Note (Signed)
Brooke Wood is a 55 year old woman with history of stage IIA ER positive breast cancer diagnosed in 03/2012 treated with lumpectomy, adjuvant radiation therapy, and 5 years of tamoxifen completed in 07/2017.    1. History of breast cancer: Her most recent mammogram was normal.  I do not appreciate any abnormality in palpation on her physical exam today.  I am concerned about this new area of tenderness, and placed orders for diagnostic mammogram and ultrasound.  I also will order a CT chest.  Considering ordering the CT chest to evaluate for anything underlying considering her history of breast cancer, she will need kidney function test today to evaluate her creatinine..  2.  Health maintenance.  We reviewed healthy diet and exercise recommendations.  She will was recommended to continue to see primary care regularly and stay up-to-date with her cancer screenings.  Nikcole will return in 1 year for continued long-term follow-up.

## 2021-02-09 ENCOUNTER — Encounter: Payer: BC Managed Care – PPO | Admitting: Adult Health

## 2021-02-26 ENCOUNTER — Encounter: Payer: Self-pay | Admitting: Internal Medicine

## 2021-02-26 ENCOUNTER — Ambulatory Visit (INDEPENDENT_AMBULATORY_CARE_PROVIDER_SITE_OTHER): Payer: BC Managed Care – PPO | Admitting: Internal Medicine

## 2021-02-26 ENCOUNTER — Other Ambulatory Visit: Payer: Self-pay

## 2021-02-26 VITALS — BP 132/84 | HR 78 | Temp 97.6°F | Resp 16 | Ht 66.0 in | Wt 158.0 lb

## 2021-02-26 DIAGNOSIS — I10 Essential (primary) hypertension: Secondary | ICD-10-CM | POA: Diagnosis not present

## 2021-02-26 DIAGNOSIS — E118 Type 2 diabetes mellitus with unspecified complications: Secondary | ICD-10-CM

## 2021-02-26 DIAGNOSIS — Z0000 Encounter for general adult medical examination without abnormal findings: Secondary | ICD-10-CM | POA: Diagnosis not present

## 2021-02-26 DIAGNOSIS — E785 Hyperlipidemia, unspecified: Secondary | ICD-10-CM | POA: Diagnosis not present

## 2021-02-26 DIAGNOSIS — Z23 Encounter for immunization: Secondary | ICD-10-CM | POA: Diagnosis not present

## 2021-02-26 DIAGNOSIS — E049 Nontoxic goiter, unspecified: Secondary | ICD-10-CM

## 2021-02-26 DIAGNOSIS — J301 Allergic rhinitis due to pollen: Secondary | ICD-10-CM

## 2021-02-26 LAB — LIPID PANEL
Cholesterol: 186 mg/dL (ref 0–200)
HDL: 64.3 mg/dL (ref >39.00)
LDL Cholesterol: 100 mg/dL — ABNORMAL HIGH (ref 0–99)
NonHDL: 121.59
Total CHOL/HDL Ratio: 3
Triglycerides: 106 mg/dL (ref 0.0–149.0)
VLDL: 21.2 mg/dL (ref 0.0–40.0)

## 2021-02-26 LAB — HEMOGLOBIN A1C: Hgb A1c MFr Bld: 6.3 % (ref 4.6–6.5)

## 2021-02-26 LAB — TSH: TSH: 1.15 u[IU]/mL (ref 0.35–5.50)

## 2021-02-26 MED ORDER — VALSARTAN-HYDROCHLOROTHIAZIDE 160-25 MG PO TABS: 1.0000 | ORAL_TABLET | Freq: Every day | ORAL | 1 refills | Status: DC

## 2021-02-26 MED ORDER — POTASSIUM CHLORIDE CRYS ER 20 MEQ PO TBCR: 20.0000 meq | EXTENDED_RELEASE_TABLET | Freq: Three times a day (TID) | ORAL | 1 refills | Status: DC

## 2021-02-26 MED ORDER — LEVOCETIRIZINE DIHYDROCHLORIDE 5 MG PO TABS
5.0000 mg | ORAL_TABLET | Freq: Every evening | ORAL | 1 refills | Status: DC
Start: 2021-02-26 — End: 2021-03-29

## 2021-02-26 MED ORDER — METHYLPREDNISOLONE 4 MG PO TBPK: ORAL_TABLET | ORAL | 0 refills | Status: AC

## 2021-02-26 MED ORDER — ROSUVASTATIN CALCIUM 5 MG PO TABS: ORAL_TABLET | Freq: Every day | ORAL | 1 refills | Status: DC

## 2021-02-26 MED ORDER — TRIAMCINOLONE ACETONIDE 55 MCG/ACT NA AERO: 2.0000 | INHALATION_SPRAY | Freq: Every day | NASAL | 3 refills | Status: DC

## 2021-02-26 NOTE — Progress Notes (Signed)
Subjective:  Patient ID: Brooke Wood, female    DOB: Apr 11, 1965  Age: 55 y.o. MRN: [id number]  CC: Annual Exam, Hypertension, Hyperlipidemia, and Allergic Rhinitis   This visit occurred during the SARS-CoV-2 public health emergency.  Safety protocols were in place, including screening questions prior to the visit, additional usage of staff PPE, and extensive cleaning of exam room while observing appropriate contact time as indicated for disinfecting solutions.    HPI Brooke Wood presents for a CPX and f/up -  She complains of a several month history of nasal congestion.  She is not using Xyzal or Nasacort.  She denies facial pain, nasal phlegm, sore throat, fever, chills, or laryngitis.  She tells me her blood pressure is well controlled.  She is active and denies chest pain, shortness of breath, diaphoresis, dizziness, lightheadedness, or edema.  Outpatient Medications Prior to Visit  Medication Sig Dispense Refill   aspirin 81 MG EC tablet TAKE 1 TABLET BY MOUTH ONCE DAILY 90 tablet 1   diclofenac Sodium (VOLTAREN) 1 % GEL Apply 4 g to affected area(s) 4 times daily as needed. 500 g 6   levocetirizine (XYZAL) 5 MG tablet Take 1 tablet (5 mg total) by mouth every evening. 90 tablet 1   potassium chloride SA (K-DUR,KLOR-CON) 20 MEQ tablet TAKE 1 TABLET BY MOUTH 3 TIMES DAILY 90 tablet 3   rosuvastatin (CRESTOR) 5 MG tablet TAKE 1 TABLET BY MOUTH ONCE A DAY 90 tablet 1   triamcinolone (NASACORT) 55 MCG/ACT AERO nasal inhaler Place 2 sprays into the nose daily. 32.4 mL 3   valsartan-hydrochlorothiazide (DIOVAN-HCT) 160-25 MG tablet TAKE 1 TABLET BY MOUTH ONCE A DAY 90 tablet 1   No facility-administered medications prior to visit.    ROS Review of Systems  Constitutional:  Negative for chills, diaphoresis, fatigue and fever.  HENT:  Positive for congestion. Negative for facial swelling, postnasal drip, rhinorrhea, sinus pressure, sore throat, trouble swallowing and voice change.    Cardiovascular:  Negative for chest pain, palpitations and leg swelling.  Gastrointestinal:  Negative for abdominal pain, diarrhea, nausea and vomiting.  Genitourinary: Negative.  Negative for difficulty urinating.  Musculoskeletal: Negative.  Negative for myalgias.  Skin: Negative.  Negative for color change.  Neurological:  Negative for dizziness and weakness.  Hematological:  Negative for adenopathy. Does not bruise/bleed easily.  Psychiatric/Behavioral: Negative.     Objective:  BP 132/84 (BP Location: Right Arm, Patient Position: Sitting, Cuff Size: Large)   Pulse 78   Temp 97.6 F (36.4 C) (Oral)   Resp 16   Ht 5\' 6"  (1.676 m)   Wt 158 lb (71.7 kg)   LMP 01/07/2016 (Approximate)   SpO2 98%   BMI 25.50 kg/m   BP Readings from Last 3 Encounters:  02/26/21 132/84  02/08/21 115/84  08/10/20 128/72    Wt Readings from Last 3 Encounters:  02/26/21 158 lb (71.7 kg)  02/08/21 158 lb (71.7 kg)  08/10/20 161 lb 12.8 oz (73.4 kg)    Physical Exam HENT:     Nose: Mucosal edema and congestion present. No rhinorrhea.     Right Nostril: No epistaxis.     Left Nostril: No epistaxis.     Right Turbinates: Pale.     Left Turbinates: Pale.     Right Sinus: No maxillary sinus tenderness or frontal sinus tenderness.     Left Sinus: No maxillary sinus tenderness or frontal sinus tenderness.  Eyes:     General: No scleral icterus.  Conjunctiva/sclera: Conjunctivae normal.  Neck:     Thyroid: No thyroid mass, thyromegaly or thyroid tenderness.  Cardiovascular:     Rate and Rhythm: Normal rate and regular rhythm.     Heart sounds: No murmur heard. Pulmonary:     Effort: Pulmonary effort is normal.     Breath sounds: No stridor. No wheezing, rhonchi or rales.  Abdominal:     General: Abdomen is flat.     Palpations: There is no mass.     Tenderness: There is no abdominal tenderness. There is no guarding.     Hernia: No hernia is present.  Musculoskeletal:        General:  Normal range of motion.     Cervical back: Normal range of motion and neck supple.     Right lower leg: No edema.     Left lower leg: No edema.  Skin:    General: Skin is warm and dry.  Neurological:     General: No focal deficit present.     Mental Status: She is alert.  Psychiatric:        Mood and Affect: Mood normal.        Behavior: Behavior normal.    Lab Results  Component Value Date   WBC 5.7 02/08/2021   HGB 13.5 02/08/2021   HCT 41.8 02/08/2021   PLT 224 02/08/2021   GLUCOSE 87 02/08/2021   CHOL 186 02/26/2021   TRIG 106.0 02/26/2021   HDL 64.30 02/26/2021   LDLCALC 100 (H) 02/26/2021   ALT 15 02/08/2021   AST 12 (L) 02/08/2021   NA 141 02/08/2021   K 3.6 02/08/2021   CL 102 02/08/2021   CREATININE 0.72 02/08/2021   BUN 21 (H) 02/08/2021   CO2 29 02/08/2021   TSH 1.15 02/26/2021   HGBA1C 6.3 02/26/2021   MICROALBUR <0.7 08/16/2019    VAS [place] LOWER EXTREMITY VENOUS (DVT)  Result Date: 05/30/2020  Lower Venous DVT Study Indications: Pain, and Swelling.  Comparison Study: NO PRIOR Performing Technologist: Abram Sander RVS  Examination Guidelines: A complete evaluation includes B-mode imaging, spectral Doppler, color Doppler, and power Doppler as needed of all accessible portions of each vessel. Bilateral testing is considered an integral part of a complete examination. Limited examinations for reoccurring indications may be performed as noted. The reflux portion of the exam is performed with the patient in reverse Trendelenburg.  +-----+---------------+---------+-----------+----------+--------------+ RIGHTCompressibilityPhasicitySpontaneityPropertiesThrombus Aging +-----+---------------+---------+-----------+----------+--------------+ CFV  Full           Yes      Yes                                 +-----+---------------+---------+-----------+----------+--------------+   +---------+---------------+---------+-----------+----------+--------------+ LEFT      CompressibilityPhasicitySpontaneityPropertiesThrombus Aging +---------+---------------+---------+-----------+----------+--------------+ CFV      Full           Yes      Yes                                 +---------+---------------+---------+-----------+----------+--------------+ SFJ      Full                                                        +---------+---------------+---------+-----------+----------+--------------+  FV Prox  Full                                                        +---------+---------------+---------+-----------+----------+--------------+ FV Mid   Full                                                        +---------+---------------+---------+-----------+----------+--------------+ FV DistalFull                                                        +---------+---------------+---------+-----------+----------+--------------+ PFV      Full                                                        +---------+---------------+---------+-----------+----------+--------------+ POP      Full           Yes      Yes                                 +---------+---------------+---------+-----------+----------+--------------+ PTV      Full                                                        +---------+---------------+---------+-----------+----------+--------------+ PERO     Full                                                        +---------+---------------+---------+-----------+----------+--------------+     Summary: RIGHT: - No evidence of common femoral vein obstruction.  LEFT: - There is no evidence of deep vein thrombosis in the lower extremity.  - No cystic structure found in the popliteal fossa.  *See table(s) above for measurements and observations. Electronically signed by Curt Jews MD on 05/30/2020 at 69:15:52 PM.    Final     Assessment & Plan:   Keyonda was seen today for annual exam, hypertension, hyperlipidemia and  allergic rhinitis .  Diagnoses and all orders for this visit:  Essential hypertension, benign- Her blood pressure is well controlled.  Will continue the current antihypertensives. -     TSH; Future -     potassium chloride SA (KLOR-CON) 20 MEQ tablet; Take 1 tablet (20 mEq total) by mouth 3 (three) times daily. -     valsartan-hydrochlorothiazide (DIOVAN-HCT) 160-25 MG tablet; Take 1 tablet by mouth daily. -     TSH  Type II diabetes mellitus with manifestations ([place])- Her blood sugar is adequately well  controlled. -     Hemoglobin A1c; Future -     Hemoglobin A1c  Goiter- Examination of the anterior neck is normal.  She is euthyroid. -     TSH; Future -     TSH  Hyperlipidemia with target LDL less than 130- LDL goal achieved. Doing well on the statin  -     Lipid panel; Future -     TSH; Future -     rosuvastatin (CRESTOR) 5 MG tablet; TAKE 1 TABLET BY MOUTH ONCE A DAY -     TSH -     Lipid panel  Routine general medical examination at a health care facility- Exam completed, labs reviewed, vaccines reviewed and updated, cancer screenings are up-to-date, patient education was given.  Seasonal allergic rhinitis due to pollen- Will treat with a 6-day course of methylprednisolone and she will restart the steroid nasal spray and oral antihistamine. -     triamcinolone (NASACORT) 55 MCG/ACT AERO nasal inhaler; Place 2 sprays into the nose daily. -     levocetirizine (XYZAL) 5 MG tablet; Take 1 tablet (5 mg total) by mouth every evening. -     methylPREDNISolone (MEDROL DOSEPAK) 4 MG TBPK tablet; TAKE AS DIRECTED  Other orders -     Flu Vaccine QUAD 6+ mos PF IM (Fluarix Quad PF)  I have changed Yumalay I. Cando's potassium chloride SA and valsartan-hydrochlorothiazide. I am also having her start on methylPREDNISolone. Additionally, I am having her maintain her aspirin, diclofenac Sodium, rosuvastatin, triamcinolone, and levocetirizine.  Meds ordered this encounter  Medications    rosuvastatin (CRESTOR) 5 MG tablet    Sig: TAKE 1 TABLET BY MOUTH ONCE A DAY    Dispense:  90 tablet    Refill:  1   potassium chloride SA (KLOR-CON) 20 MEQ tablet    Sig: Take 1 tablet (20 mEq total) by mouth 3 (three) times daily.    Dispense:  270 tablet    Refill:  1   valsartan-hydrochlorothiazide (DIOVAN-HCT) 160-25 MG tablet    Sig: Take 1 tablet by mouth daily.    Dispense:  90 tablet    Refill:  1   triamcinolone (NASACORT) 55 MCG/ACT AERO nasal inhaler    Sig: Place 2 sprays into the nose daily.    Dispense:  32.4 mL    Refill:  3   levocetirizine (XYZAL) 5 MG tablet    Sig: Take 1 tablet (5 mg total) by mouth every evening.    Dispense:  90 tablet    Refill:  1   methylPREDNISolone (MEDROL DOSEPAK) 4 MG TBPK tablet    Sig: TAKE AS DIRECTED    Dispense:  21 tablet    Refill:  0     Follow-up: Return in about 6 months (around 08/26/2021).  [name], MD

## 2021-02-26 NOTE — Patient Instructions (Signed)

## 2021-02-27 ENCOUNTER — Encounter: Payer: Self-pay | Admitting: Internal Medicine

## 2021-03-05 ENCOUNTER — Ambulatory Visit
Admission: RE | Admit: 2021-03-05 | Discharge: 2021-03-05 | Disposition: A | Discharge status: Home / Self Care | Payer: BC Managed Care – PPO | Source: Ambulatory Visit | Attending: Adult Health | Admitting: Adult Health

## 2021-03-05 ENCOUNTER — Ambulatory Visit: Payer: BC Managed Care – PPO

## 2021-03-05 DIAGNOSIS — N644 Mastodynia: Secondary | ICD-10-CM | POA: Diagnosis not present

## 2021-03-05 DIAGNOSIS — R922 Inconclusive mammogram: Secondary | ICD-10-CM | POA: Diagnosis not present

## 2021-03-05 DIAGNOSIS — Z170 Estrogen receptor positive status [ER+]: Secondary | ICD-10-CM

## 2021-03-05 DIAGNOSIS — C50411 Malignant neoplasm of upper-outer quadrant of right female breast: Secondary | ICD-10-CM

## 2021-03-05 DIAGNOSIS — Z853 Personal history of malignant neoplasm of breast: Secondary | ICD-10-CM | POA: Diagnosis not present

## 2021-03-12 ENCOUNTER — Ambulatory Visit (HOSPITAL_COMMUNITY)
Admission: RE | Admit: 2021-03-12 | Discharge: 2021-03-12 | Disposition: A | Discharge status: Home / Self Care | Payer: BC Managed Care – PPO | Source: Ambulatory Visit | Attending: Adult Health | Admitting: Adult Health

## 2021-03-12 ENCOUNTER — Telehealth: Payer: Self-pay

## 2021-03-12 ENCOUNTER — Other Ambulatory Visit: Payer: Self-pay

## 2021-03-12 DIAGNOSIS — Z170 Estrogen receptor positive status [ER+]: Secondary | ICD-10-CM | POA: Diagnosis not present

## 2021-03-12 DIAGNOSIS — M47814 Spondylosis without myelopathy or radiculopathy, thoracic region: Secondary | ICD-10-CM | POA: Diagnosis not present

## 2021-03-12 DIAGNOSIS — Z853 Personal history of malignant neoplasm of breast: Secondary | ICD-10-CM | POA: Diagnosis not present

## 2021-03-12 DIAGNOSIS — N644 Mastodynia: Secondary | ICD-10-CM | POA: Diagnosis not present

## 2021-03-12 DIAGNOSIS — I2510 Atherosclerotic heart disease of native coronary artery without angina pectoris: Secondary | ICD-10-CM | POA: Diagnosis not present

## 2021-03-12 DIAGNOSIS — C50411 Malignant neoplasm of upper-outer quadrant of right female breast: Secondary | ICD-10-CM

## 2021-03-12 MED ORDER — SODIUM CHLORIDE (PF) 0.9 % IJ SOLN
INTRAMUSCULAR | Status: AC
  Filled 2021-03-12: qty 50

## 2021-03-12 MED ORDER — IOHEXOL 350 MG/ML SOLN
60.0000 mL | Freq: Once | INTRAVENOUS | Status: AC | PRN
  Administered 2021-03-12: 60 mL via INTRAVENOUS

## 2021-03-12 NOTE — Telephone Encounter (Signed)
Pt given results per NP. Pt verbalized thanks and understanding.

## 2021-03-12 NOTE — Telephone Encounter (Signed)
-----   Message from Gardenia Phlegm, NP sent at 03/12/2021  3:30 PM EST ----- Doristine Devoid news.  CT scan shows no cancer.  Pleaes notify patient ----- Message ----- From: Interface, Rad Results In Sent: 03/12/2021  12:07 PM EST To: Gardenia Phlegm, NP

## 2021-03-29 ENCOUNTER — Telehealth: Payer: Self-pay | Admitting: Internal Medicine

## 2021-03-29 ENCOUNTER — Other Ambulatory Visit: Payer: Self-pay | Admitting: Internal Medicine

## 2021-03-29 DIAGNOSIS — J301 Allergic rhinitis due to pollen: Secondary | ICD-10-CM

## 2021-03-29 MED ORDER — LEVOCETIRIZINE DIHYDROCHLORIDE 5 MG PO TABS: 5.0000 mg | ORAL_TABLET | Freq: Every evening | ORAL | 1 refills | Status: DC

## 2021-03-29 NOTE — Telephone Encounter (Signed)
1.Medication Requested: levocetirizine (XYZAL) 5 MG tablet  2. Pharmacy (Name, Street, [place]): [place], [place].  Phone:  [phone] Fax:  [phone]   3. On Med List: yes  4. Last Visit with PCP: 11.28.22  5. Next visit date with PCP: 05.22.23  **Patient says she is having severe allergy issues & also possible sinus infection.. requesting provider to squeeze her in today**   Agent: Please be advised that RX refills may take up to 3 business days. We ask that you follow-up with your pharmacy.

## 2021-03-31 DIAGNOSIS — Z20822 Contact with and (suspected) exposure to covid-19: Secondary | ICD-10-CM | POA: Diagnosis not present

## 2021-03-31 DIAGNOSIS — U071 COVID-19: Secondary | ICD-10-CM | POA: Diagnosis not present

## 2021-04-19 ENCOUNTER — Other Ambulatory Visit: Payer: Self-pay | Admitting: Obstetrics and Gynecology

## 2021-04-19 DIAGNOSIS — Z1231 Encounter for screening mammogram for malignant neoplasm of breast: Secondary | ICD-10-CM

## 2021-05-03 ENCOUNTER — Encounter: Payer: Self-pay | Admitting: Internal Medicine

## 2021-05-14 ENCOUNTER — Telehealth: Payer: Self-pay | Admitting: Internal Medicine

## 2021-05-14 NOTE — Telephone Encounter (Signed)
Pt was notified that Dr. Lilian Coma Schedule in full up till April. Pt was notified that we will put her on a list and call her when we can get her  scheduled: Pt verbalized understanding with all questions answered.

## 2021-05-14 NOTE — Telephone Encounter (Signed)
Inbound call from patient is ready to schedule Hospital Recall with Dr. Carlean Purl

## 2021-05-28 ENCOUNTER — Ambulatory Visit
Admission: RE | Admit: 2021-05-28 | Discharge: 2021-05-28 | Disposition: A | Discharge status: Home / Self Care | Payer: BC Managed Care – PPO | Source: Ambulatory Visit | Attending: Obstetrics and Gynecology | Admitting: Obstetrics and Gynecology

## 2021-05-28 DIAGNOSIS — Z1231 Encounter for screening mammogram for malignant neoplasm of breast: Secondary | ICD-10-CM | POA: Diagnosis not present

## 2021-06-11 NOTE — Telephone Encounter (Signed)
Patient called in to follow up on scheduling recall colonoscopy at hospital  ?

## 2021-06-11 NOTE — Telephone Encounter (Signed)
Pt made aware that her name is still on the list and as soon as we schedule her we will call her and make her aware: ?Pt verbalized understanding with all questions answered.  ? ?

## 2021-06-12 ENCOUNTER — Emergency Department (HOSPITAL_COMMUNITY): Payer: BC Managed Care – PPO

## 2021-06-12 ENCOUNTER — Emergency Department (HOSPITAL_COMMUNITY)
Admission: EM | Admit: 2021-06-12 | Discharge: 2021-06-12 | Disposition: A | Discharge status: Home / Self Care | Payer: BC Managed Care – PPO | Attending: Emergency Medicine | Admitting: Emergency Medicine

## 2021-06-12 DIAGNOSIS — R0789 Other chest pain: Secondary | ICD-10-CM | POA: Diagnosis not present

## 2021-06-12 DIAGNOSIS — R079 Chest pain, unspecified: Secondary | ICD-10-CM | POA: Diagnosis not present

## 2021-06-12 DIAGNOSIS — Z7982 Long term (current) use of aspirin: Secondary | ICD-10-CM | POA: Diagnosis not present

## 2021-06-12 DIAGNOSIS — E119 Type 2 diabetes mellitus without complications: Secondary | ICD-10-CM | POA: Insufficient documentation

## 2021-06-12 LAB — COMPREHENSIVE METABOLIC PANEL
ALT: 17 U/L (ref 0–44)
AST: 15 U/L (ref 15–41)
Albumin: 4.1 g/dL (ref 3.5–5.0)
Alkaline Phosphatase: 114 U/L (ref 38–126)
Anion gap: 9 (ref 5–15)
BUN: 14 mg/dL (ref 6–20)
CO2: 28 mmol/L (ref 22–32)
Calcium: 9.5 mg/dL (ref 8.9–10.3)
Chloride: 101 mmol/L (ref 98–111)
Creatinine, Ser: 0.51 mg/dL (ref 0.44–1.00)
GFR, Estimated: >60 mL/min (ref >60)
Glucose, Bld: 88 mg/dL (ref 70–99)
Potassium: 3.4 mmol/L — ABNORMAL LOW (ref 3.5–5.1)
Sodium: 138 mmol/L (ref 135–145)
Total Bilirubin: 0.6 mg/dL (ref 0.3–1.2)
Total Protein: 6.9 g/dL (ref 6.5–8.1)

## 2021-06-12 LAB — CBC
HCT: 40.2 % (ref 36.0–46.0)
Hemoglobin: 13.4 g/dL (ref 12.0–15.0)
MCH: 29.3 pg (ref 26.0–34.0)
MCHC: 33.3 g/dL (ref 30.0–36.0)
MCV: 88 fL (ref 80.0–100.0)
Platelets: 200 10*3/uL (ref 150–400)
RBC: 4.57 MIL/uL (ref 3.87–5.11)
RDW: 14.2 % (ref 11.5–15.5)
WBC: 6.1 10*3/uL (ref 4.0–10.5)
nRBC: 0 % (ref 0.0–0.2)

## 2021-06-12 LAB — TROPONIN I (HIGH SENSITIVITY): Troponin I (High Sensitivity): 3 ng/L (ref <18)

## 2021-06-12 LAB — D-DIMER, QUANTITATIVE: D-Dimer, Quant: 0.56 ug/mL-FEU — ABNORMAL HIGH (ref 0.00–0.50)

## 2021-06-12 NOTE — ED Provider Notes (Signed)
Patient's lab work reviewed and she has a negative age-adjusted D-dimer.  Chest x-ray and other labs are benign.  My suspicion for ACS, PE, etc. is low.  Check she states has been having shortness of breath on exertion for "a while".  I do not think this is as acute as it was initially presented.  Appears stable for discharge home.  Based on time of presentation I do not think a second troponin is needed. ?  ?Sherwood Gambler, MD ?06/12/21 1957 ? ?

## 2021-06-12 NOTE — ED Provider Triage Note (Signed)
Emergency Medicine Provider Triage Evaluation Note ? ?[name] , a 56 y.o. female  was evaluated in triage.  Pt complains of right-sided chest pain that started while she was walking earlier today at work.  Lasted for 10 minutes.  Not radiate.  Additionally endorses shortness of breath with exertion "for the last month or the last year of not really sure".  Denies palpitations denies any leg swelling.  Denies any recent prolonged travel or immobilization.  Has history of breast cancer having completed treatment 9 years ago.. ? ?Review of Systems  ?Positive: Chest pain, shortness of breath ?Negative: Bilateral lower extremity swelling, palpitations ? ?Physical Exam  ?BP (!) 141/82 (BP Location: Left Arm)   Pulse 84   Temp 98.1 ?F (36.7 ?C) (Oral)   Resp 14   LMP 01/07/2016 (Approximate)   SpO2 99%  ?Gen:   Awake, no distress   ?Resp:  Normal effort  ?MSK:   Moves extremities without difficulty  ?Other:  RRR no M/R/G.  Lungs CTA B. ? ?Medical Decision Making  ?Medically screening exam initiated at 3:33 PM.  Appropriate orders placed.  RICKY DOAN was informed that the remainder of the evaluation will be completed by another provider, this initial triage assessment does not replace that evaluation, and the importance of remaining in the ED until their evaluation is complete. ? ?Patient is chest pain-free at this time. ? ?This chart was dictated using voice recognition software, Dragon. Despite the best efforts of this provider to proofread and correct errors, errors may still occur which can change documentation meaning. ? ?  ?Emeline Darling, PA-C ?06/12/21 1537 ? ?

## 2021-06-12 NOTE — Discharge Instructions (Addendum)
If you develop recurrent, continued, or worsening chest pain, shortness of breath, fever, vomiting, abdominal or back pain, or any other new/concerning symptoms then return to the ER for evaluation.  

## 2021-06-12 NOTE — ED Triage Notes (Signed)
EMS stated, pt at work started having hot flash went on break on the way back and had a sharpe rt. Sided chest pain that lasted about 10 min. She has had no pain since and no other symptoms ?20 g in left AC ? NO ASA alllergic. ?No Nitro= ?

## 2021-06-12 NOTE — ED Notes (Signed)
Pt. Stated, I get SOB when Im climbing steps or walk further. ?

## 2021-06-12 NOTE — ED Provider Notes (Signed)
?[facility] ?Provider Note ? ? ?CSN: [id number] ?Arrival date & time: 06/12/21  1454 ? ?  ? ?History ? ?Chief Complaint  ?Patient presents with  ? Chest Pain  ? ? ?Brooke Wood is a 56 y.o. female with a past medical history of right-sided breast cancer status post radiation and lymph node excision currently in remission, DM 2, hyperlipidemia, who presents today for evaluation of right-sided chest pain. ?She states that she was at work today when she had a hot flash, which is usual for her, and then went to sit down.  She ate a small amount of cake, got up to start walking and then had sharp right-sided chest pain.  She denies feeling lightheaded or dizzy during this.  No diaphoresis during this pain. ?She notes that over the past week she has also had difficulty walking up stairs due to getting short of breath.  She states that this has been progressively worsening.  She denies any cough or fever. ? ?She denies any leg swelling.   ? ?No abdominal pain, nausea, vomiting, or diarrhea. ? ?She does report that at work she has been mopping recently which is new for her.  ? ?HPI ? ?  ? ?Home Medications ?Prior to Admission medications   ?Medication Sig Start Date End Date Taking? Authorizing Provider  ?aspirin 81 MG EC tablet TAKE 1 TABLET BY MOUTH ONCE DAILY 07/18/20 07/18/21  [name], MD  ?diclofenac Sodium (VOLTAREN) 1 % GEL Apply 4 g to affected area(s) 4 times daily as needed. 11/14/20   Hilts, Legrand Como, MD  ?levocetirizine (XYZAL) 5 MG tablet Take 1 tablet (5 mg total) by mouth every evening. 03/29/21   [name], MD  ?potassium chloride SA (KLOR-CON) 20 MEQ tablet Take 1 tablet (20 mEq total) by mouth 3 (three) times daily. 02/26/21   [name], MD  ?rosuvastatin (CRESTOR) 5 MG tablet TAKE 1 TABLET BY MOUTH ONCE A DAY 02/26/21 02/26/22  [name], MD  ?triamcinolone (NASACORT) 55 MCG/ACT AERO nasal inhaler Place 2 sprays into the nose daily. 02/26/21    [name], MD  ?valsartan-hydrochlorothiazide (DIOVAN-HCT) 160-25 MG tablet Take 1 tablet by mouth daily. 02/26/21 02/26/22  [name], MD  ?   ? ?Allergies    ?Aspirin and Codeine   ? ?Review of Systems   ?Review of Systems ? ?Physical Exam ?Updated Vital Signs ?BP 137/89   Pulse 74   Temp 98.1 ?F (36.7 ?C) (Oral)   Resp 12   LMP 01/07/2016 (Approximate)   SpO2 100%  ?Physical Exam ?Vitals and nursing note reviewed.  ?Constitutional:   ?   General: She is not in acute distress. ?   Appearance: She is not ill-appearing.  ?HENT:  ?   Head: Atraumatic.  ?Eyes:  ?   Conjunctiva/sclera: Conjunctivae normal.  ?Cardiovascular:  ?   Rate and Rhythm: Normal rate and regular rhythm.  ?   Pulses:     ?     Posterior tibial pulses are 2+ on the right side and 2+ on the left side.  ?   Heart sounds: Normal heart sounds. No murmur heard. ?Pulmonary:  ?   Effort: Pulmonary effort is normal. No respiratory distress.  ?   Breath sounds: Normal breath sounds.  ?Chest:  ?   Chest wall: No tenderness.  ?   Comments: Unable to recreate her right sided chest pain with movements of right shoulder/arm and with palpation over  the right chest wall.  ?Abdominal:  ?   General: There is no distension.  ?Musculoskeletal:  ?   Cervical back: Normal range of motion and neck supple.  ?   Right lower leg: No tenderness. No edema.  ?   Left lower leg: No tenderness. No edema.  ?   Comments: No obvious acute injury  ?Skin: ?   General: Skin is warm.  ?Neurological:  ?   Mental Status: She is alert.  ?   Comments: Awake and alert, answers all questions appropriately.  Speech is not slurred.  ?Psychiatric:     ?   Mood and Affect: Mood normal.     ?   Behavior: Behavior normal.  ? ? ?ED Results / Procedures / Treatments   ?Labs ?(all labs ordered are listed, but only abnormal results are displayed) ?Labs Reviewed  ?D-DIMER, QUANTITATIVE - Abnormal; Notable for the following components:  ?    Result Value  ? D-Dimer, Quant 0.56 (*)   ?  All other components within normal limits  ?CBC  ?COMPREHENSIVE METABOLIC PANEL  ?TROPONIN I (HIGH SENSITIVITY)  ?TROPONIN I (HIGH SENSITIVITY)  ? ? ?EKG ?EKG Interpretation ? ?Date/Time:  Tuesday June 12 2021 15:03:47 EDT ?Ventricular Rate:  89 ?PR Interval:  126 ?QRS Duration: 120 ?QT Interval:  386 ?QTC Calculation: 469 ?R Axis:   44 ?Text Interpretation: Normal sinus rhythm Possible Left atrial enlargement Right bundle branch block no significant change since? 2018 Confirmed by Sherwood Gambler [phone]) on 06/12/2021 4:43:38 PM ? ?Radiology ?DG Chest 2 View ? ?Result Date: 06/12/2021 ?CLINICAL DATA:  Sharp right-sided chest pain EXAM: CHEST - 2 VIEW COMPARISON:  07/18/2015 FINDINGS: Frontal and lateral views of the chest demonstrate an unremarkable cardiac silhouette. No acute airspace disease, effusion, or pneumothorax. No acute bony abnormalities. IMPRESSION: 1. No acute intrathoracic process. Electronically Signed   By: Randa Ngo M.D.   On: 06/12/2021 15:55   ? ?Procedures ?Procedures  ? ? ?Medications Ordered in ED ?Medications - No data to display ? ?ED Course/ Medical Decision Making/ A&P ?  ?                        ?Medical Decision Making ?Patient is a 56 year old woman who presents today for evaluation of right-sided chest pain.  This lasted for about 5 minutes today however noticeably she has been having shortness of breath when attempting to walk up the stairs worsening over the past week. ?On my exam I am unable to recreate or exacerbate her pain. ?Chest x-ray without consolidation, pneumothorax or other cause for her symptoms found.  Her abdomen is soft nontender nondistended. ? ?Given her shortness of breath with exertion D-dimer is ordered.  Troponins will be checked and CMP. ? ? ? ?Amount and/or Complexity of Data Reviewed ?Labs: ordered. ?   Details: CBC is unremarkable ?Radiology: ordered. ?   Details: No consolidation pneumothorax or other abnormalities on chest x-ray ? ? ?At shift change  care was transferred to Dr, Regenia Skeeter who will follow pending studies, re-evaulate and determine disposition.   ? ?Note: Portions of this report may have been transcribed using voice recognition software. Every effort was made to ensure accuracy; however, inadvertent computerized transcription errors may be present ? ? ?Final Clinical Impression(s) / ED Diagnoses ?Final diagnoses:  ?Atypical chest pain  ? ? ?Rx / DC Orders ?ED Discharge Orders   ? ? None  ? ?  ? ? ?  ?Wyn Quaker  W, PA-C ?06/12/21 1902 ? ?  ?Sherwood Gambler, MD ?06/12/21 2332 ? ?

## 2021-06-14 ENCOUNTER — Telehealth: Payer: Self-pay | Admitting: Internal Medicine

## 2021-06-14 NOTE — Telephone Encounter (Signed)
Pt states she was informed at ed discharge to see provider immediately for chest pain, pt states she is still have chest pain ? ?Informed pt I did not locate a "see by date" , encouraged pt to return to ed if immediate care is needed, pt stated she is not going back to ed ? ?Provider's next availability is end of April, pt requesting a cb ? ? ?

## 2021-06-15 ENCOUNTER — Other Ambulatory Visit: Payer: Self-pay

## 2021-06-15 ENCOUNTER — Encounter: Payer: Self-pay | Admitting: Family Medicine

## 2021-06-15 ENCOUNTER — Ambulatory Visit (INDEPENDENT_AMBULATORY_CARE_PROVIDER_SITE_OTHER): Payer: BC Managed Care – PPO | Admitting: Family Medicine

## 2021-06-15 VITALS — BP 132/82 | HR 87 | Temp 97.9°F | Ht 66.0 in | Wt 163.0 lb

## 2021-06-15 DIAGNOSIS — R0789 Other chest pain: Secondary | ICD-10-CM | POA: Diagnosis not present

## 2021-06-15 DIAGNOSIS — E785 Hyperlipidemia, unspecified: Secondary | ICD-10-CM | POA: Diagnosis not present

## 2021-06-15 DIAGNOSIS — I10 Essential (primary) hypertension: Secondary | ICD-10-CM | POA: Diagnosis not present

## 2021-06-15 NOTE — Progress Notes (Signed)
? ?Subjective:  ? ? Patient ID: Brooke Wood, female    DOB: 1965-11-09, 56 y.o.   MRN: [id number] ? ?HPI ?Chief Complaint  ?Patient presents with  ? Hospitalization Follow-up  ?  06/12/21 hospitalization. Pt states she has been feeling better and has no concerns. Has not had the right sided chest pain since then.  ? ?She is a 56 year old African-American female with a history of hypertension, hyperlipidemia and is a 9-year survivor of breast cancer.  She is here today to follow-up on atypical chest pain after presenting to the emergency department on 06/12/2021 with right-sided chest pain.  A cardiopulmonary etiology was ruled out in the emergency department.  Benign labs including troponin and D-dimer, as well as a chest x-ray.  EKG was unchanged. ? ?States her pain resolved and she has been pain-free for the past 2 days.  She denies any symptoms at this time. ?Her job involves sweeping and mopping and she requests an out of work note for tomorrow. ? ? ?HTN-reports good compliance with hypertension therapy. ?States she has not been taking her statin or aspirin for the past month but plans on getting back on track. ? ?Denies fever, chills, dizziness, palpitations, shortness of breath, abdominal pain, N/V/D, urinary symptoms, LE edema.  ? ? ?Reviewed allergies, medications, past medical, surgical, family, and social history. ? ? ? ?Review of Systems ?Pertinent positives and negatives in the history of present illness. ? ?   ?Objective:  ? Physical Exam ?Constitutional:   ?   Appearance: Normal appearance. She is not ill-appearing.  ?Cardiovascular:  ?   Rate and Rhythm: Normal rate and regular rhythm.  ?   Pulses: Normal pulses.  ?Pulmonary:  ?   Effort: Pulmonary effort is normal.  ?   Breath sounds: Normal breath sounds.  ?   Comments: Focal TTP over the right 2nd intercostal space, no crepitus ?Chest:  ?   Chest wall: Tenderness present.  ?Musculoskeletal:  ?   Cervical back: Normal range of motion and neck supple.   ?   Right lower leg: No edema.  ?   Left lower leg: No edema.  ?Lymphadenopathy:  ?   Cervical: No cervical adenopathy.  ?Skin: ?   General: Skin is warm and dry.  ?   Capillary Refill: Capillary refill takes less than 2 seconds.  ?Neurological:  ?   General: No focal deficit present.  ?   Mental Status: She is alert.  ?Psychiatric:     ?   Mood and Affect: Mood normal.  ? ?BP 132/82 (BP Location: Left Arm, Patient Position: Sitting, Cuff Size: Normal)   Pulse 87   Temp 97.9 ?F (36.6 ?C) (Oral)   Ht '5\' 6"'$  (1.676 m)   Wt 163 lb (73.9 kg)   LMP 01/07/2016 (Approximate)   SpO2 96%   BMI 26.31 kg/m?  ? ? ? ? ?   ?Assessment & Plan:  ?Musculoskeletal chest pain ? ?Hyperlipidemia with target LDL less than 130 ? ?Essential hypertension, benign ? ?Reviewed notes and results from her ED visit on 06/12/2021.  Symptoms have resolved and she is back to baseline.  Discussed possible etiologies for her chest pain which has since resolved.  Suspect musculoskeletal pain.  She may try Tylenol if her pain returns and I advised her to avoid strenuous upper body activity for the next 2 to 3 days.  A work note was provided for tomorrow. ?Encouraged good compliance with all of her medications including her statin and aspirin. ?  She will follow-up with her PCP as scheduled. ?I also advised her to seek immediate medical attention if she develops any new or worsening symptoms over the weekend by calling 911 or go to the emergency department.   ?

## 2021-06-15 NOTE — Patient Instructions (Signed)
Your chest pain appears to have been due to a musculoskeletal issue.  ?Try to avoid strenuous upper body activities for the next 2-3 days.  ?You may take Tylenol 500 mg twice daily as needed for pain.  ? ?If your chest pain returns or you have any new or worsening symptoms, you should go to the emergency room for evaluation or call 911.  ?Follow up with your primary care provider as scheduled.  ? ? ?Nonspecific Chest Pain, Adult ?Chest pain is an uncomfortable, tight, or painful feeling in the chest. The pain can feel like a crushing, aching, or squeezing pressure. A person can feel a burning or tingling sensation. Chest pain can also be felt in your back, neck, jaw, shoulder, or arm. This pain can be worse when you move, sneeze, or take a deep breath. ?Chest pain can be caused by a condition that is life-threatening. This must be treated right away. It can also be caused by something that is not life-threatening. If you have chest pain, it can be hard to know the difference, so it is important to get help right away to make sure that you do not have a serious condition. ?Some life-threatening causes of chest pain include: ?Heart attack. ?A tear in the body's main blood vessel (aortic dissection). ?Inflammation around your heart (pericarditis). ?A problem in the lungs, such as a blood clot (pulmonary embolism) or a collapsed lung (pneumothorax). ?Some non life-threatening causes of chest pain include: ?Heartburn. ?Anxiety or stress. ?Damage to the bones, muscles, and cartilage that make up your chest wall. ?Pneumonia or bronchitis. ?Shingles infection (varicella-zoster virus). ?Your chest pain may come and go. It may also be constant. Your health care provider will do tests and other studies to find the cause of your pain. Treatment will depend on the cause of your chest pain. ?Follow these instructions at home: ?Medicines ?Take over-the-counter and prescription medicines only as told by your health care  provider. ?If you were prescribed an antibiotic medicine, take it as told by your health care provider. Do not stop taking the antibiotic even if you start to feel better. ?Activity ?Avoid any activities that cause chest pain. ?Do not lift anything that is heavier than 10 lb (4.5 kg), or the limit that you are told, until your health care provider says that it is safe. ?Rest as directed by your health care provider. ?Return to your normal activities only as told by your health care provider. Ask your health care provider what activities are safe for you. ?Lifestyle ?  ?Do not use any products that contain nicotine or tobacco, such as cigarettes, e-cigarettes, and chewing tobacco. If you need help quitting, ask your health care provider. ?Do not drink alcohol. ?Make healthy lifestyle changes as recommended. These may include: ?Getting regular exercise. Ask your health care provider to suggest some exercises that are safe for you. ?Eating a heart-healthy diet. This includes plenty of fresh fruits and vegetables, whole grains, low-fat (lean) protein, and low-fat dairy products. A dietitian can help you find healthy eating options. ?Maintaining a healthy weight. ?Managing any other health conditions you may have, such as high blood pressure (hypertension) or diabetes. ?Reducing stress, such as with yoga or relaxation techniques. ?General instructions ?Pay attention to any changes in your symptoms. ?It is up to you to get the results of any tests that were done. Ask your health care provider, or the department that is doing the tests, when your results will be ready. ?Keep all follow-up visits  as told by your health care provider. This is important. ?You may be asked to go for further testing if your chest pain does not go away. ?Contact a health care provider if: ?Your chest pain does not go away. ?You feel depressed. ?You have a fever. ?You notice changes in your symptoms or develop new symptoms. ?Get help right away  if: ?Your chest pain gets worse. ?You have a cough that gets worse, or you cough up blood. ?You have severe pain in your abdomen. ?You faint. ?You have sudden, unexplained chest discomfort. ?You have sudden, unexplained discomfort in your arms, back, neck, or jaw. ?You have shortness of breath at any time. ?You suddenly start to sweat, or your skin gets clammy. ?You feel nausea or you vomit. ?You suddenly feel lightheaded or dizzy. ?You have severe weakness, or unexplained weakness or fatigue. ?Your heart begins to beat quickly, or it feels like it is skipping beats. ?These symptoms may represent a serious problem that is an emergency. Do not wait to see if the symptoms will go away. Get medical help right away. Call your local emergency services (911 in the [place] Do not drive yourself to the hospital. ?Summary ?Chest pain can be caused by a condition that is serious and requires urgent treatment. It may also be caused by something that is not life-threatening. ?Your health care provider may do lab tests and other studies to find the cause of your pain. ?Follow your health care provider's instructions on taking medicines, making lifestyle changes, and getting emergency treatment if symptoms become worse. ?Keep all follow-up visits as told by your health care provider. This includes visits for any further testing if your chest pain does not go away. ?This information is not intended to replace advice given to you by your health care provider. Make sure you discuss any questions you have with your health care provider. ?Document Revised: 06/01/2020 Document Reviewed: 06/01/2020 ?Elsevier Patient Education ? [place]. ? ?

## 2021-07-02 ENCOUNTER — Telehealth: Payer: Self-pay | Admitting: Internal Medicine

## 2021-07-02 NOTE — Telephone Encounter (Signed)
Pt making provider aware she has been light headed since 3-31 ? ?Encouraged pt to schedule an appt for tomorrow 4-4 w/ another [facility] provider, pt declined due to scheduling conflict and needing the appt for today ? ?Pt requesting a c/b ?

## 2021-07-02 NOTE — Telephone Encounter (Signed)
Pt has been scheduled for Wed 4/5 @ 11.20am. She stated she would make arrangements with her employer to come in for that time.  ?

## 2021-07-04 ENCOUNTER — Encounter: Payer: Self-pay | Admitting: Internal Medicine

## 2021-07-04 ENCOUNTER — Ambulatory Visit (INDEPENDENT_AMBULATORY_CARE_PROVIDER_SITE_OTHER): Payer: BC Managed Care – PPO | Admitting: Internal Medicine

## 2021-07-04 VITALS — BP 130/82 | HR 85 | Temp 97.9°F | Ht 66.0 in | Wt 159.0 lb

## 2021-07-04 DIAGNOSIS — K529 Noninfective gastroenteritis and colitis, unspecified: Secondary | ICD-10-CM | POA: Diagnosis not present

## 2021-07-04 MED ORDER — PROMETHAZINE HCL 12.5 MG PO TABS: 12.5000 mg | ORAL_TABLET | Freq: Four times a day (QID) | ORAL | 0 refills | Status: DC | PRN

## 2021-07-04 NOTE — Progress Notes (Signed)
? ?Subjective:  ?Patient ID: Brooke Wood, female    DOB: February 11, 1966  Age: 56 y.o. MRN: [id number] ? ?CC: GI Problem ? ? ?HPI ?Brooke Wood presents for f/up - ? ?She complains of a 1 week history of intermittent nausea, vomiting, and dizziness.  One of her coworkers has a stomach flu. ? ?Outpatient Medications Prior to Visit  ?Medication Sig Dispense Refill  ? aspirin 81 MG EC tablet TAKE 1 TABLET BY MOUTH ONCE DAILY 90 tablet 1  ? diclofenac Sodium (VOLTAREN) 1 % GEL Apply 4 g to affected area(s) 4 times daily as needed. 500 g 6  ? levocetirizine (XYZAL) 5 MG tablet Take 1 tablet (5 mg total) by mouth every evening. 90 tablet 1  ? potassium chloride SA (KLOR-CON) 20 MEQ tablet Take 1 tablet (20 mEq total) by mouth 3 (three) times daily. 270 tablet 1  ? rosuvastatin (CRESTOR) 5 MG tablet TAKE 1 TABLET BY MOUTH ONCE A DAY 90 tablet 1  ? triamcinolone (NASACORT) 55 MCG/ACT AERO nasal inhaler Place 2 sprays into the nose daily. 32.4 mL 3  ? valsartan-hydrochlorothiazide (DIOVAN-HCT) 160-25 MG tablet Take 1 tablet by mouth daily. 90 tablet 1  ? ?No facility-administered medications prior to visit.  ? ? ?ROS ?Review of Systems  ?Constitutional:  Negative for chills, diaphoresis, fatigue and fever.  ?HENT: Negative.    ?Eyes: Negative.   ?Respiratory:  Negative for cough, chest tightness, shortness of breath and wheezing.   ?Cardiovascular:  Negative for chest pain, palpitations and leg swelling.  ?Gastrointestinal:  Positive for nausea and vomiting. Negative for abdominal pain, constipation and diarrhea.  ?Genitourinary: Negative.  Negative for difficulty urinating.  ?Musculoskeletal: Negative.  Negative for myalgias.  ?Skin: Negative.  Negative for color change and rash.  ?Neurological:  Positive for dizziness. Negative for weakness and light-headedness.  ?Hematological:  Negative for adenopathy. Does not bruise/bleed easily.  ?Psychiatric/Behavioral: Negative.    ? ?Objective:  ?BP 130/82 (BP Location: Left Arm,  Patient Position: Sitting, Cuff Size: Normal)   Pulse 85   Temp 97.9 ?F (36.6 ?C) (Oral)   Ht '5\' 6"'$  (1.676 m)   Wt 159 lb (72.1 kg)   LMP 01/07/2016 (Approximate)   SpO2 99%   BMI 25.66 kg/m?  ? ?BP Readings from Last 3 Encounters:  ?07/04/21 130/82  ?06/15/21 132/82  ?06/12/21 127/87  ? ? ?Wt Readings from Last 3 Encounters:  ?07/04/21 159 lb (72.1 kg)  ?06/15/21 163 lb (73.9 kg)  ?02/26/21 158 lb (71.7 kg)  ? ? ?Physical Exam ?Vitals reviewed.  ?HENT:  ?   Nose: Nose normal.  ?   Mouth/Throat:  ?   Mouth: Mucous membranes are moist.  ?Eyes:  ?   General: No scleral icterus. ?   Conjunctiva/sclera: Conjunctivae normal.  ?Cardiovascular:  ?   Rate and Rhythm: Normal rate and regular rhythm.  ?   Heart sounds: No murmur heard. ?Pulmonary:  ?   Effort: Pulmonary effort is normal.  ?   Breath sounds: No stridor. No wheezing, rhonchi or rales.  ?Abdominal:  ?   General: Abdomen is flat.  ?   Palpations: There is no mass.  ?   Tenderness: There is no abdominal tenderness. There is no guarding.  ?   Hernia: No hernia is present.  ?Musculoskeletal:     ?   General: Normal range of motion.  ?   Cervical back: Neck supple.  ?   Right lower leg: No edema.  ?   Left  lower leg: No edema.  ?Lymphadenopathy:  ?   Cervical: No cervical adenopathy.  ?Skin: ?   General: Skin is warm and dry.  ?   Findings: No rash.  ?Neurological:  ?   General: No focal deficit present.  ?   Mental Status: She is alert.  ?Psychiatric:     ?   Mood and Affect: Mood normal.  ? ? ?Lab Results  ?Component Value Date  ? WBC 6.1 06/12/2021  ? HGB 13.4 06/12/2021  ? HCT 40.2 06/12/2021  ? PLT 200 06/12/2021  ? GLUCOSE 88 06/12/2021  ? CHOL 186 02/26/2021  ? TRIG 106.0 02/26/2021  ? HDL 64.30 02/26/2021  ? [place] 100 (H) 02/26/2021  ? ALT 17 06/12/2021  ? AST 15 06/12/2021  ? NA 138 06/12/2021  ? K 3.4 (L) 06/12/2021  ? CL 101 06/12/2021  ? CREATININE 0.51 06/12/2021  ? BUN 14 06/12/2021  ? CO2 28 06/12/2021  ? TSH 1.15 02/26/2021  ? HGBA1C 6.3  02/26/2021  ? MICROALBUR <0.7 08/16/2019  ? ? ?DG Chest 2 View ? ?Result Date: 06/12/2021 ?CLINICAL DATA:  Sharp right-sided chest pain EXAM: CHEST - 2 VIEW COMPARISON:  07/18/2015 FINDINGS: Frontal and lateral views of the chest demonstrate an unremarkable cardiac silhouette. No acute airspace disease, effusion, or pneumothorax. No acute bony abnormalities. IMPRESSION: 1. No acute intrathoracic process. Electronically Signed   By: Randa Ngo M.D.   On: 06/12/2021 15:55  ? ? ?Assessment & Plan:  ? ?Brooke Wood was seen today for gi problem. ? ?Diagnoses and all orders for this visit: ? ?Acute gastroenteritis- She was advised to stay well-hydrated and to use promethazine as needed.  I gave her a work note as well. ?-     promethazine (PHENERGAN) 12.5 MG tablet; Take 1 tablet (12.5 mg total) by mouth every 6 (six) hours as needed for nausea or vomiting. ? ? ?I am having Brooke Wood start on promethazine. I am also having her maintain her aspirin, diclofenac Sodium, rosuvastatin, potassium chloride SA, valsartan-hydrochlorothiazide, triamcinolone, and levocetirizine. ? ?Meds ordered this encounter  ?Medications  ? promethazine (PHENERGAN) 12.5 MG tablet  ?  Sig: Take 1 tablet (12.5 mg total) by mouth every 6 (six) hours as needed for nausea or vomiting.  ?  Dispense:  30 tablet  ?  Refill:  0  ? ? ? ?Follow-up: Return if symptoms worsen or fail to improve. ? ?[name], MD ?

## 2021-07-04 NOTE — Patient Instructions (Signed)
Viral Gastroenteritis, Adult ?Viral gastroenteritis is also known as the stomach flu. This condition may affect your stomach, small intestine, and large intestine. It can cause sudden watery diarrhea, fever, and vomiting. This condition is caused by many different viruses. These viruses can be passed from person to person very easily (are contagious). ?Diarrhea and vomiting can make you feel weak and cause you to become dehydrated. You may not be able to keep fluids down. Dehydration can make you tired and thirsty, cause you to have a dry mouth, and decrease how often you urinate. It is important to replace the fluids that you lose from diarrhea and vomiting. ?What are the causes? ?Gastroenteritis is caused by many viruses, including rotavirus and norovirus. Norovirus is the most common cause in adults. You can get sick after being exposed to the viruses from other people. You can also get sick by: ?Eating food, drinking water, or touching a surface contaminated with one of these viruses. ?Sharing utensils or other personal items with an infected person. ?What increases the risk? ?You are more likely to develop this condition if you: ?Have a weak body defense system (immune system). ?Live with one or more children who are younger than 25 years old. ?Live in a nursing home. ?Travel on cruise ships. ?What are the signs or symptoms? ?Symptoms of this condition start suddenly 1-3 days after exposure to a virus. Symptoms may last for a few days or for as long as a week. Common symptoms include watery diarrhea and vomiting. Other symptoms include: ?Fever. ?Headache. ?Fatigue. ?Pain in the abdomen. ?Chills. ?Weakness. ?Nausea. ?Muscle aches. ?Loss of appetite. ?How is this diagnosed? ?This condition is diagnosed with a medical history and physical exam. You may also have a stool test to check for viruses or other infections. ?How is this treated? ?This condition typically goes away on its own. The focus of treatment is to  prevent dehydration and restore lost fluids (rehydration). This condition may be treated with: ?An oral rehydration solution (ORS) to replace important salts and minerals (electrolytes) in your body. Take this if told by your health care provider. This is a drink that is sold at pharmacies and retail stores. ?Medicines to help with your symptoms. ?Probiotic supplements to reduce symptoms of diarrhea. ?Fluids given through an IV, if dehydration is severe. ?Older adults and people with other diseases or a weak immune system are at higher risk for dehydration. ?Follow these instructions at home: ?Eating and drinking ? ?Take an ORS as told by your health care provider. ?Drink clear fluids in small amounts as you are able. Clear fluids include: ?Water. ?Ice chips. ?Diluted fruit juice. ?Low-calorie sports drinks. ?Drink enough fluid to keep your urine pale yellow. ?Eat small amounts of healthy foods every 3-4 hours as you are able. This may include whole grains, fruits, vegetables, lean meats, and yogurt. ?Avoid fluids that contain a lot of sugar or caffeine, such as energy drinks, sports drinks, and soda. ?Avoid spicy or fatty foods. ?Avoid alcohol. ?General instructions ?Wash your hands often, especially after having diarrhea or vomiting. If soap and water are not available, use hand sanitizer. ?Make sure that all people in your household wash their hands well and often. ?Take over-the-counter and prescription medicines only as told by your health care provider. ?Rest at home while you recover. ?Watch your condition for any changes. ?Take a warm bath to relieve any burning or pain from frequent diarrhea episodes. ?Keep all follow-up visits as told by your health care provider. This  is important. ?Contact a health care provider if you: ?Cannot keep fluids down. ?Have symptoms that get worse. ?Have new symptoms. ?Feel light-headed or dizzy. ?Have muscle cramps. ?Get help right away if you: ?Have chest pain. ?Feel  extremely weak or you faint. ?See blood in your vomit. ?Have vomit that looks like coffee grounds. ?Have bloody or black stools or stools that look like tar. ?Have a severe headache, a stiff neck, or both. ?Have a rash. ?Have severe pain, cramping, or bloating in your abdomen. ?Have trouble breathing or you are breathing very quickly. ?Have a fast heartbeat. ?Have skin that feels cold and clammy. ?Feel confused. ?Have pain when you urinate. ?Have signs of dehydration, such as: ?Dark urine, very little urine, or no urine. ?Cracked lips. ?Dry mouth. ?Sunken eyes. ?Sleepiness. ?Weakness. ?Summary ?Viral gastroenteritis is also known as the stomach flu. It can cause sudden watery diarrhea, fever, and vomiting. ?This condition can be passed from person to person very easily (is contagious). ?Take an ORS if told by your health care provider. This is a drink that is sold at pharmacies and retail stores. ?Wash your hands often, especially after having diarrhea or vomiting. If soap and water are not available, use hand sanitizer. ?This information is not intended to replace advice given to you by your health care provider. Make sure you discuss any questions you have with your health care provider. ?Document Revised: 09/04/2018 Document Reviewed: 01/21/2018 ?Elsevier Patient Education ? [place]. ? ?

## 2021-07-16 NOTE — Telephone Encounter (Signed)
Pt questioning when her Colonoscopy may be scheduled:  ?Pt made aware that her name is still on the list and as soon as we schedule her we will call her and make her aware: ?Pt verbalized understanding with all questions answered.  ?  ?

## 2021-07-16 NOTE — Telephone Encounter (Signed)
Inbound call from patient wanting to follow up on hospital colonoscopy. Please advise.  ?

## 2021-08-20 ENCOUNTER — Ambulatory Visit: Payer: BC Managed Care – PPO | Admitting: Internal Medicine

## 2021-09-03 NOTE — Telephone Encounter (Signed)
Patient called in to follow up on scheduling recall colonoscopy at hospital. Please advise.

## 2021-09-06 ENCOUNTER — Telehealth: Payer: Self-pay | Admitting: Internal Medicine

## 2021-09-06 NOTE — Telephone Encounter (Signed)
Left message for pt to call back  °

## 2021-09-06 NOTE — Telephone Encounter (Signed)
Inbound call from patient to schedule hospital colon. Please give patient a call back to advise.  Thank you

## 2021-09-07 NOTE — Telephone Encounter (Signed)
Left message for pt to call back  °

## 2021-09-07 NOTE — Telephone Encounter (Signed)
Patient is returning your call.  

## 2021-09-10 NOTE — Telephone Encounter (Signed)
Inbound call from patient stating that she is returning your call. Please advise.

## 2021-09-10 NOTE — Telephone Encounter (Signed)
Pt was notified that we have not completed the [facility] yet for procedures in the hospital. Pt was notified that as soon at the list is complete that I will notify her of the status. Pt verbalized understanding with all questions answered.

## 2021-09-24 ENCOUNTER — Ambulatory Visit (INDEPENDENT_AMBULATORY_CARE_PROVIDER_SITE_OTHER): Payer: BC Managed Care – PPO | Admitting: Internal Medicine

## 2021-09-24 ENCOUNTER — Encounter: Payer: Self-pay | Admitting: Internal Medicine

## 2021-09-24 ENCOUNTER — Ambulatory Visit (INDEPENDENT_AMBULATORY_CARE_PROVIDER_SITE_OTHER): Payer: BC Managed Care – PPO

## 2021-09-24 VITALS — BP 132/86 | HR 85 | Temp 98.3°F | Resp 16 | Ht 66.0 in | Wt 155.0 lb

## 2021-09-24 DIAGNOSIS — E876 Hypokalemia: Secondary | ICD-10-CM

## 2021-09-24 DIAGNOSIS — I10 Essential (primary) hypertension: Secondary | ICD-10-CM | POA: Diagnosis not present

## 2021-09-24 DIAGNOSIS — M79622 Pain in left upper arm: Secondary | ICD-10-CM

## 2021-09-24 DIAGNOSIS — Z23 Encounter for immunization: Secondary | ICD-10-CM

## 2021-09-24 DIAGNOSIS — E785 Hyperlipidemia, unspecified: Secondary | ICD-10-CM | POA: Diagnosis not present

## 2021-09-24 DIAGNOSIS — E118 Type 2 diabetes mellitus with unspecified complications: Secondary | ICD-10-CM

## 2021-09-24 DIAGNOSIS — Z86010 Personal history of colonic polyps: Secondary | ICD-10-CM

## 2021-09-24 LAB — MICROALBUMIN / CREATININE URINE RATIO
Creatinine,U: 145.1 mg/dL
Microalb Creat Ratio: 0.5 mg/g (ref 0.0–30.0)
Microalb, Ur: <0.7 mg/dL (ref 0.0–1.9)

## 2021-09-24 LAB — URINALYSIS, ROUTINE W REFLEX MICROSCOPIC
Bilirubin Urine: NEGATIVE
Hgb urine dipstick: NEGATIVE
Ketones, ur: NEGATIVE
Leukocytes,Ua: NEGATIVE
Nitrite: NEGATIVE
RBC / HPF: NONE SEEN (ref 0–?)
Specific Gravity, Urine: 1.015 (ref 1.000–1.030)
Total Protein, Urine: NEGATIVE
Urine Glucose: NEGATIVE
Urobilinogen, UA: 0.2 (ref 0.0–1.0)
pH: 7 (ref 5.0–8.0)

## 2021-09-24 LAB — BASIC METABOLIC PANEL
BUN: 14 mg/dL (ref 6–23)
CO2: 29 mEq/L (ref 19–32)
Calcium: 9.5 mg/dL (ref 8.4–10.5)
Chloride: 99 mEq/L (ref 96–112)
Creatinine, Ser: 0.8 mg/dL (ref 0.40–1.20)
GFR: 81.27 mL/min (ref >60.00)
Glucose, Bld: 102 mg/dL — ABNORMAL HIGH (ref 70–99)
Potassium: 3.6 mEq/L (ref 3.5–5.1)
Sodium: 135 mEq/L (ref 135–145)

## 2021-09-24 LAB — HEMOGLOBIN A1C: Hgb A1c MFr Bld: 6.2 % (ref 4.6–6.5)

## 2021-09-24 LAB — MAGNESIUM: Magnesium: 2.1 mg/dL (ref 1.5–2.5)

## 2021-09-24 MED ORDER — OLMESARTAN MEDOXOMIL 20 MG PO TABS: 20.0000 mg | ORAL_TABLET | Freq: Every day | ORAL | 1 refills | Status: DC

## 2021-09-24 MED ORDER — ROSUVASTATIN CALCIUM 5 MG PO TABS: ORAL_TABLET | Freq: Every day | ORAL | 1 refills | Status: DC

## 2021-09-24 MED ORDER — SPIRONOLACTONE 25 MG PO TABS: 25.0000 mg | ORAL_TABLET | Freq: Every day | ORAL | 1 refills | Status: DC

## 2021-09-25 ENCOUNTER — Encounter: Payer: Self-pay | Admitting: Internal Medicine

## 2021-10-05 ENCOUNTER — Telehealth: Payer: Self-pay

## 2021-10-05 NOTE — Telephone Encounter (Unsigned)
Left message to call back to schedule colonoscopy at [facility]:

## 2021-10-05 NOTE — Telephone Encounter (Unsigned)
Left message for pt to call back to see if she is available for September 7 for procedure at [facility]:

## 2021-10-05 NOTE — Telephone Encounter (Signed)
Is on her break, please call her back as soon as you can.

## 2021-10-08 ENCOUNTER — Other Ambulatory Visit: Payer: Self-pay

## 2021-10-08 DIAGNOSIS — Z86010 Personal history of colonic polyps: Secondary | ICD-10-CM

## 2021-10-08 NOTE — Telephone Encounter (Signed)
Pt was scheduled for a Colonoscopy at [facility] on 12/06/2021 at 8:30 AM with Dr. Carlean Purl: Case Number [id number]: Pt made aware: Pt scheduled for a previsit appointment on 11/06/2021 at 9:00: In Person: Pt made aware: Pt verbalized understanding with all questions answered.

## 2021-11-06 ENCOUNTER — Ambulatory Visit (AMBULATORY_SURGERY_CENTER): Payer: Self-pay

## 2021-11-06 VITALS — Ht 66.0 in | Wt 152.0 lb

## 2021-11-06 DIAGNOSIS — Z86010 Personal history of colonic polyps: Secondary | ICD-10-CM

## 2021-11-06 NOTE — Progress Notes (Signed)
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 Scheduled for [facility] for Colon. No FH of Malignant Hyperthermia Pt is not on diet pills Pt is not on  home 02  Pt is not on blood thinners  Pt denies issues with constipation  No A fib or A flutter Have any cardiac testing pending--denies Pt instructed to use Single[employer] or GoodRx for a price reduction on prep

## 2021-11-28 ENCOUNTER — Encounter: Payer: Self-pay | Admitting: Internal Medicine

## 2021-11-28 ENCOUNTER — Encounter (HOSPITAL_COMMUNITY): Payer: Self-pay | Admitting: Internal Medicine

## 2021-11-28 NOTE — Progress Notes (Signed)
Attempted to obtain medical history via telephone, unable to reach at this time. HIPAA compliant voicemail message left requesting return call to pre surgical testing department. 

## 2021-12-05 NOTE — Anesthesia Preprocedure Evaluation (Addendum)
Anesthesia Evaluation  Patient identified by MRN, date of birth, ID band Patient awake    Reviewed: Allergy & Precautions, NPO status , Patient's Chart, lab work & pertinent test results  History of Anesthesia Complications (+) PONV, DIFFICULT AIRWAY and history of anesthetic complications  Airway Mallampati: II  TM Distance: >3 FB Neck ROM: Full    Dental no notable dental hx. (+) Teeth Intact, Dental Advisory Given   Pulmonary neg pulmonary ROS,    Pulmonary exam normal breath sounds clear to auscultation       Cardiovascular hypertension, Pt. on medications negative cardio ROS Normal cardiovascular exam Rhythm:Regular Rate:Normal     Neuro/Psych Bilateral hearing loss negative neurological ROS  negative psych ROS   GI/Hepatic negative GI ROS, Neg liver ROS,   Endo/Other  negative endocrine ROSdiabetes, Type 2  Renal/GU negative Renal ROS  negative genitourinary   Musculoskeletal negative musculoskeletal ROS (+)   Abdominal   Peds negative pediatric ROS (+)  Hematology negative hematology ROS (+)   Anesthesia Other Findings Procedure Name: Intubation Date/Time: 01/22/2017 7:48 AM Performed by: Casimer Lanius A Pre-anesthesia Checklist: Patient identified, Emergency Drugs available, Suction available and Patient being monitored Patient Re-evaluated:Patient Re-evaluated prior to induction Oxygen Delivery Method: Circle system utilized and Simple face mask Preoxygenation: Pre-oxygenation with 100% oxygen Induction Type: IV induction, Combination inhalational/ intravenous induction and Cricoid Pressure applied Ventilation: Mask ventilation without difficulty Laryngoscope Size: Mac and 3 (Mac 3 X2 LB,FH Lopro 3 X1 FH) Grade View: Grade II Tube type: Oral Tube size: 7.0 mm Number of attempts: 3 (Anterior due to bun and trendguard Grade 4 view with DL) Airway Equipment and Method: Stylet and  Video-laryngoscopy Placement Confirmation: ETT inserted through vocal cords under direct vision,  positive ETCO2 and breath sounds checked- equal and bilateral Secured at: 20 (right lip) cm Tube secured with: Tape Dental Injury: Teeth and Oropharynx as per pre-operative assessment  Difficulty Due To: Difficulty was unanticipated and Difficult Airway- due to anterior larynx Future Recommendations: Recommend- induction with short-acting agent, and alternative techniques readily available  Reproductive/Obstetrics negative OB ROS                           Anesthesia Physical  Anesthesia Plan  ASA: 3  Anesthesia Plan: MAC   Post-op Pain Management: Minimal or no pain anticipated   Induction: Intravenous  PONV Risk Score and Plan: 4 or greater and Propofol infusion  Airway Management Planned: Natural Airway and Simple Face Mask  Additional Equipment: None  Intra-op Plan:   Post-operative Plan:   Informed Consent: I have reviewed the patients History and Physical, chart, labs and discussed the procedure including the risks, benefits and alternatives for the proposed anesthesia with the patient or authorized representative who has indicated his/her understanding and acceptance.     Dental advisory given  Plan Discussed with: CRNA and Anesthesiologist  Anesthesia Plan Comments:        Anesthesia Quick Evaluation

## 2021-12-06 ENCOUNTER — Encounter (HOSPITAL_COMMUNITY)
Admission: RE | Disposition: A | Discharge status: Home / Self Care | Payer: Self-pay | Source: Home / Self Care | Attending: Internal Medicine

## 2021-12-06 ENCOUNTER — Other Ambulatory Visit: Payer: Self-pay

## 2021-12-06 ENCOUNTER — Ambulatory Visit (HOSPITAL_COMMUNITY): Payer: BC Managed Care – PPO | Admitting: Certified Registered Nurse Anesthetist

## 2021-12-06 ENCOUNTER — Ambulatory Visit (HOSPITAL_COMMUNITY)
Admission: RE | Admit: 2021-12-06 | Discharge: 2021-12-06 | Disposition: A | Discharge status: Home / Self Care | Payer: BC Managed Care – PPO | Attending: Internal Medicine | Admitting: Internal Medicine

## 2021-12-06 ENCOUNTER — Encounter (HOSPITAL_COMMUNITY): Payer: Self-pay | Admitting: Internal Medicine

## 2021-12-06 DIAGNOSIS — K635 Polyp of colon: Secondary | ICD-10-CM | POA: Diagnosis not present

## 2021-12-06 DIAGNOSIS — Z79899 Other long term (current) drug therapy: Secondary | ICD-10-CM | POA: Insufficient documentation

## 2021-12-06 DIAGNOSIS — Z1211 Encounter for screening for malignant neoplasm of colon: Secondary | ICD-10-CM | POA: Insufficient documentation

## 2021-12-06 DIAGNOSIS — I10 Essential (primary) hypertension: Secondary | ICD-10-CM | POA: Insufficient documentation

## 2021-12-06 DIAGNOSIS — Z09 Encounter for follow-up examination after completed treatment for conditions other than malignant neoplasm: Secondary | ICD-10-CM

## 2021-12-06 DIAGNOSIS — D120 Benign neoplasm of cecum: Secondary | ICD-10-CM | POA: Diagnosis present

## 2021-12-06 DIAGNOSIS — E119 Type 2 diabetes mellitus without complications: Secondary | ICD-10-CM | POA: Diagnosis not present

## 2021-12-06 DIAGNOSIS — Z86010 Personal history of colonic polyps: Secondary | ICD-10-CM | POA: Insufficient documentation

## 2021-12-06 DIAGNOSIS — J301 Allergic rhinitis due to pollen: Secondary | ICD-10-CM

## 2021-12-06 DIAGNOSIS — K625 Hemorrhage of anus and rectum: Secondary | ICD-10-CM | POA: Diagnosis not present

## 2021-12-06 HISTORY — PX: POLYPECTOMY: SHX5525

## 2021-12-06 HISTORY — PX: COLONOSCOPY WITH PROPOFOL: SHX5780

## 2021-12-06 SURGERY — COLONOSCOPY WITH PROPOFOL
Anesthesia: Monitor Anesthesia Care

## 2021-12-06 MED ORDER — LIDOCAINE 2% (20 MG/ML) 5 ML SYRINGE
INTRAMUSCULAR | Status: DC | PRN
  Administered 2021-12-06: 80 mg via INTRAVENOUS

## 2021-12-06 MED ORDER — LEVOCETIRIZINE DIHYDROCHLORIDE 5 MG PO TABS: 5.0000 mg | ORAL_TABLET | Freq: Every day | ORAL | Status: DC | PRN

## 2021-12-06 MED ORDER — PROPOFOL 10 MG/ML IV BOLUS
INTRAVENOUS | Status: AC
  Filled 2021-12-06: qty 20

## 2021-12-06 MED ORDER — SODIUM CHLORIDE 0.9 % IV SOLN: INTRAVENOUS | Status: DC

## 2021-12-06 MED ORDER — PROPOFOL 500 MG/50ML IV EMUL
INTRAVENOUS | Status: DC | PRN
  Administered 2021-12-06: 125 ug/kg/min via INTRAVENOUS

## 2021-12-06 MED ORDER — LACTATED RINGERS IV SOLN
INTRAVENOUS | Status: AC | PRN
  Administered 2021-12-06: 1000 mL via INTRAVENOUS

## 2021-12-06 MED ORDER — PROPOFOL 1000 MG/100ML IV EMUL
INTRAVENOUS | Status: AC
  Filled 2021-12-06: qty 100

## 2021-12-06 MED ORDER — PROPOFOL 10 MG/ML IV BOLUS
INTRAVENOUS | Status: DC | PRN
  Administered 2021-12-06 (×3): 20 mg via INTRAVENOUS

## 2021-12-06 SURGICAL SUPPLY — 22 items

## 2021-12-06 NOTE — Op Note (Signed)
[facility] Patient Name: Brooke Wood Procedure Date: 12/06/2021 MRN: [id number] Attending MD: Gatha Mayer , MD Date of Birth: 08/27/1965 CSN: [id number] Age: 56 Admit Type: Outpatient Procedure:                Colonoscopy Indications:              Surveillance: Personal history of adenomatous                            polyps on last colonoscopy > 5 years ago, Last                            colonoscopy: 2015 Providers:                Gatha Mayer, MD, Benay Pillow, RN, [facility]                            Technician, Technician Referring MD:              Medicines:                Monitored Anesthesia Care Complications:            No immediate complications. Estimated Blood Loss:     Estimated blood loss was minimal. Procedure:                Pre-Anesthesia Assessment:                           - Prior to the procedure, a History and Physical                            was performed, and patient medications and                            allergies were reviewed. The patient's tolerance of                            previous anesthesia was also reviewed. The risks                            and benefits of the procedure and the sedation                            options and risks were discussed with the patient.                            All questions were answered, and informed consent                            was obtained. Prior Anticoagulants: The patient has                            taken no previous anticoagulant or antiplatelet                            agents.  ASA Grade Assessment: III - A patient with                            severe systemic disease. After reviewing the risks                            and benefits, the patient was deemed in                            satisfactory condition to undergo the procedure.                           After obtaining informed consent, the colonoscope                            was passed under direct  vision. Throughout the                            procedure, the patient's blood pressure, pulse, and                            oxygen saturations were monitored continuously. The                            CF-HQ190L ([id number]) Olympus colonoscope was                            introduced through the anus and advanced to the the                            cecum, identified by appendiceal orifice and                            ileocecal valve. The colonoscopy was performed                            without difficulty. The patient tolerated the                            procedure well. The quality of the bowel                            preparation was good. The ileocecal valve,                            appendiceal orifice, and rectum were photographed.                            The bowel preparation used was Miralax via split                            dose instruction. Scope In: 6:65:99 AM Scope Out: 9:25:19 AM Scope Withdrawal Time: 0 hours 14 minutes 46 seconds  Total Procedure Duration: 0 hours 19 minutes 5  seconds  Findings:      The perianal and digital rectal examinations were normal.      A 12 mm polyp was found in the cecum. The polyp was flat. The polyp was       removed with a piecemeal technique using a cold snare. Resection and       retrieval were complete. Verification of patient identification for the       specimen was done. Estimated blood loss was minimal.      The exam was otherwise without abnormality on direct and retroflexion       views. Impression:               - One 12 mm polyp in the cecum, removed piecemeal                            using a cold snare. Resected and retrieved.                           - The examination was otherwise normal on direct                            and retroflexion views.                           - Personal history of colonic polyps.2016 2                            adenomas max 6 mm.                           - Procedure  performed at hospital due to difficult                            airway.                           - Intermittent rectal bleeding presumed from                            anorectal irritation Moderate Sedation:      Not Applicable - Patient had care per Anesthesia. Recommendation:           - Patient has a contact number available for                            emergencies. The signs and symptoms of potential                            delayed complications were discussed with the                            patient. Return to normal activities tomorrow.                            Written discharge instructions were provided to the  patient.                           - Resume previous diet.                           - Continue present medications.                           - Repeat colonoscopy is recommended for                            surveillance. The colonoscopy date will be                            determined after pathology results from today's                            exam become available for review. Procedure Code(s):        --- Professional ---                           [phone], Colonoscopy, flexible; with removal of                            tumor(s), polyp(s), or other lesion(s) by snare                            technique Diagnosis Code(s):        --- Professional ---                           Z86.010, Personal history of colonic polyps                           K63.5, Polyp of colon CPT copyright 2019 [employer]. All rights reserved. The codes documented in this report are preliminary and upon coder review may  be revised to meet current compliance requirements. Gatha Mayer, MD 12/06/2021 9:36:38 AM This report has been signed electronically. Number of Addenda: 0

## 2021-12-06 NOTE — H&P (Signed)
[facility] History and Physical   Primary Care Physician:  [name], MD   Reason for Procedure:   Hx colonic polyps  Plan:    colonoscopy     HPI: Brooke Wood is a 56 y.o. female here for polyp surveillance exam. Also w/ intermittent rectal bleeding. Hx difficult airway  2 adenomas max 6 mm 2015 Past Medical History:  Diagnosis Date   Allergy    Neg RAST 2007   Breast cancer ([place]) 2014   right breast   Diabetes mellitus without complication ([place])    Difficult airway for intubation 02/19/2019   Hx of adenomatous colonic polyps 03/03/2014   repeat 2022   Hx of radiation therapy 06/15/12 - 07/29/12   right rbeast, 50.4 Gy x 28 fx, boost to cumulative dose 60.4 gray   Hypertension    Personal history of radiation therapy 2014   PONV (postoperative nausea and vomiting)    SVD (spontaneous vaginal delivery)    x 1   Vocal cord dysfunction    Neg Methacholine challenge test 2007    Past Surgical History:  Procedure Laterality Date   BREAST LUMPECTOMY Right 2014   BREAST LUMPECTOMY WITH SENTINEL LYMPH NODE BIOPSY  04/09/2012   Procedure: BREAST LUMPECTOMY WITH SENTINEL LYMPH NODE BX;  Surgeon: Merrie Roof, MD;  Location: [facility];  Service: General;  Laterality: Right;   [place]   rt br bx   COLONOSCOPY  03/2014   poylps   CYSTOSCOPY N/A 01/22/2017   Procedure: CYSTOSCOPY;  Surgeon: Bobbye Charleston, MD;  Location: [facility];  Service: Gynecology;  Laterality: N/A;   ROBOTIC ASSISTED TOTAL HYSTERECTOMY WITH BILATERAL SALPINGO OOPHERECTOMY Bilateral 01/22/2017   Procedure: ROBOTIC ASSISTED TOTAL HYSTERECTOMY WITH BILATERAL SALPINGECTOMY AND LEFT OOPHORECTOMY;  Surgeon: Bobbye Charleston, MD;  Location: [facility];  Service: Gynecology;  Laterality: Bilateral;    Prior to Admission medications   Medication Sig Start Date End Date Taking? Authorizing Provider  olmesartan (BENICAR) 20 MG tablet Take 1 tablet (20 mg total) by  mouth daily. 09/24/21  Yes [name], MD  rosuvastatin (CRESTOR) 5 MG tablet TAKE 1 TABLET BY MOUTH ONCE A DAY Patient taking differently: Take 5 mg by mouth at bedtime. 09/24/21 09/24/22 Yes [name], MD  spironolactone (ALDACTONE) 25 MG tablet Take 1 tablet (25 mg total) by mouth daily. 09/24/21  Yes [name], MD  levocetirizine (XYZAL) 5 MG tablet Take 1 tablet (5 mg total) by mouth every evening. Patient taking differently: Take 5 mg by mouth daily as needed (allergies (typically during Fall months)). 03/29/21   [name], MD    Current Facility-Administered Medications  Medication Dose Route Frequency Provider Last Rate Last Admin   0.9 %  sodium chloride infusion   Intravenous Continuous Gatha Mayer, MD       lactated ringers infusion    Continuous PRN Gatha Mayer, MD 50 mL/hr at 12/06/21 0730 1,000 mL at 12/06/21 0730    Allergies as of 10/08/2021 - Review Complete 09/24/2021  Allergen Reaction Noted   Aspirin Hives    Codeine Other (See Comments)     Family History  Problem Relation Age of Onset   Lung cancer Father 7       smoker   Cancer Father        lung   Hypertension Other    Alcohol abuse Neg Hx    Diabetes Neg Hx    Early  death Neg Hx    Hearing loss Neg Hx    Heart disease Neg Hx    Hyperlipidemia Neg Hx    Stroke Neg Hx    Kidney disease Neg Hx    Colon cancer Neg Hx    Breast cancer Neg Hx    Colon polyps Neg Hx    Esophageal cancer Neg Hx    Rectal cancer Neg Hx    Stomach cancer Neg Hx     Social History   Socioeconomic History   Marital status: Single    Spouse name: Not on file   Number of children: Not on file   Years of education: Not on file   Highest education level: Not on file  Occupational History    Employer: UNEMPLOYED    Comment: [employer]  Tobacco Use   Smoking status: Never   Smokeless tobacco: Never  Vaping Use   Vaping Use: Never used  Substance and Sexual Activity   Alcohol use: No    Drug use: No   Sexual activity: Not Currently    Birth control/protection: None  Other Topics Concern   Not on file  Social History Narrative   Regular exercise-No   Social Determinants of Health   Financial Resource Strain: Not on file  Food Insecurity: Not on file  Transportation Needs: Not on file  Physical Activity: Not on file  Stress: Not on file  Social Connections: Not on file  Intimate Partner Violence: Not on file    Review of Systems:  All other review of systems negative except as mentioned in the HPI.  Physical Exam: Vital signs BP (!) 141/94 (BP Location: Left Arm)   Temp 97.7 F (36.5 C) (Temporal)   Resp 14   Ht '5\' 6"'$  (1.676 m)   Wt 68 kg   LMP 01/07/2016 (Approximate)   SpO2 100%   BMI 24.21 kg/m   General:   Alert,  Well-developed, well-nourished, pleasant and cooperative in NAD Lungs:  Clear throughout to auscultation.   Heart:  Regular rate and rhythm; no murmurs, clicks, rubs,  or gallops. Abdomen:  Soft, nontender and nondistended. Normal bowel sounds.   Neuro/Psych:  Alert and cooperative. Normal mood and affect. A and O x 3   '@Elford Evilsizer'$  Simonne Maffucci, MD, [facility] [phone] (pager) 12/06/2021 7:45 AM@

## 2021-12-06 NOTE — Discharge Instructions (Addendum)
I found and removed one polyp that looks benign. All else ok.   The rectal bleeding you see at times must be from irritation at the anus.  I will let you know pathology results and when to have another routine colonoscopy by mail and/or My Chart.  I appreciate the opportunity to care for you. Gatha Mayer, MD, [facility]

## 2021-12-06 NOTE — Anesthesia Postprocedure Evaluation (Signed)
Anesthesia Post Note  Patient: Brooke Wood  Procedure(s) Performed: COLONOSCOPY WITH PROPOFOL POLYPECTOMY     Patient location during evaluation: PACU Anesthesia Type: MAC Level of consciousness: awake and alert Pain management: pain level controlled Vital Signs Assessment: post-procedure vital signs reviewed and stable Respiratory status: spontaneous breathing, nonlabored ventilation, respiratory function stable and patient connected to nasal cannula oxygen Cardiovascular status: stable and blood pressure returned to baseline Postop Assessment: no apparent nausea or vomiting Anesthetic complications: no   No notable events documented.  Last Vitals:  Vitals:   12/06/21 0959 12/06/21 1000  BP: 138/86 (!) 151/90  Pulse: 64 67  Resp: 16 10  Temp:    SpO2: 99% 100%    Last Pain:  Vitals:   12/06/21 1000  TempSrc:   PainSc: 0-No pain                 [name]

## 2021-12-06 NOTE — Transfer of Care (Signed)
Immediate Anesthesia Transfer of Care Note  Patient: Brooke Wood  Procedure(s) Performed: COLONOSCOPY WITH PROPOFOL POLYPECTOMY  Patient Location: Endoscopy Unit  Anesthesia Type:MAC  Level of Consciousness: drowsy and patient cooperative  Airway & Oxygen Therapy: Patient Spontanous Breathing and Patient connected to face mask oxygen  Post-op Assessment: Report given to RN and Post -op Vital signs reviewed and stable  Post vital signs: Reviewed and stable  Last Vitals:  Vitals Value Taken Time  BP 161/71 12/06/21 0931  Temp 36.6 C 12/06/21 0931  Pulse 72 12/06/21 0932  Resp 16 12/06/21 0932  SpO2 100 % 12/06/21 0932  Vitals shown include unvalidated device data.  Last Pain:  Vitals:   12/06/21 0931  TempSrc: Temporal  PainSc: 0-No pain         Complications: No notable events documented.

## 2021-12-07 ENCOUNTER — Encounter (HOSPITAL_COMMUNITY): Payer: Self-pay | Admitting: Internal Medicine

## 2021-12-07 LAB — SURGICAL PATHOLOGY

## 2021-12-09 ENCOUNTER — Encounter: Payer: Self-pay | Admitting: Internal Medicine

## 2021-12-17 IMAGING — US US AXILLARY RIGHT
1 series · 4 of 4 positions shown · non-contrast
Comparison: Previous exam(s).

CLINICAL DATA: 54-year-old female with history of right breast
cancer in 0761 status post lumpectomy and radiation. On recent exam
the patient's position noted tenderness at the lumpectomy site as
well as some fullness below the patient's scar. Patient is not sure
if this is new or not.

EXAM:
DIGITAL DIAGNOSTIC RIGHT MAMMOGRAM WITH TOMO
ULTRASOUND RIGHT BREAST

[Series 1: us axillary right · 0.08mm/px · 4 of 4 slices shown]
[im 1/4]
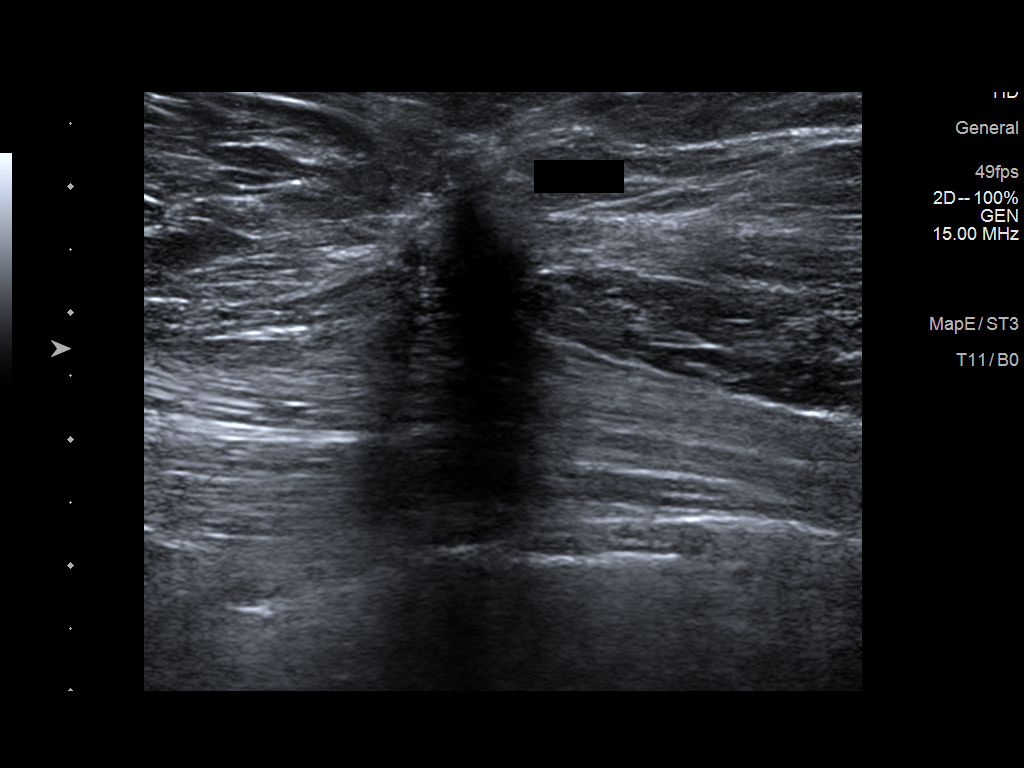
[im 2/4]
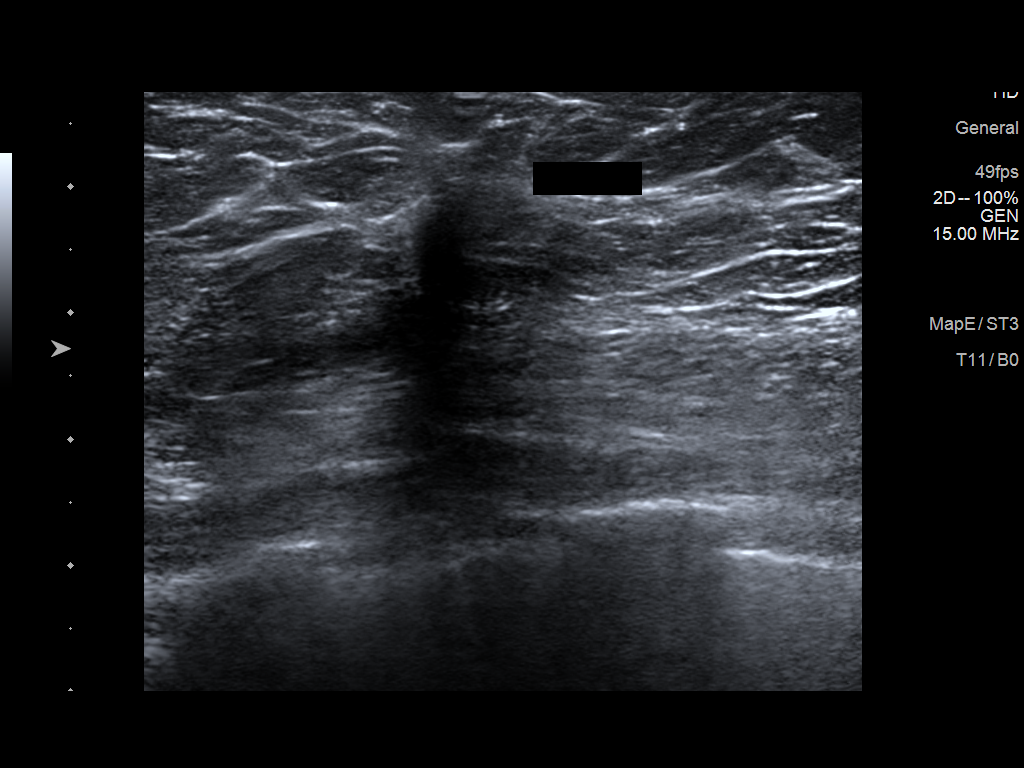
[im 3/4]
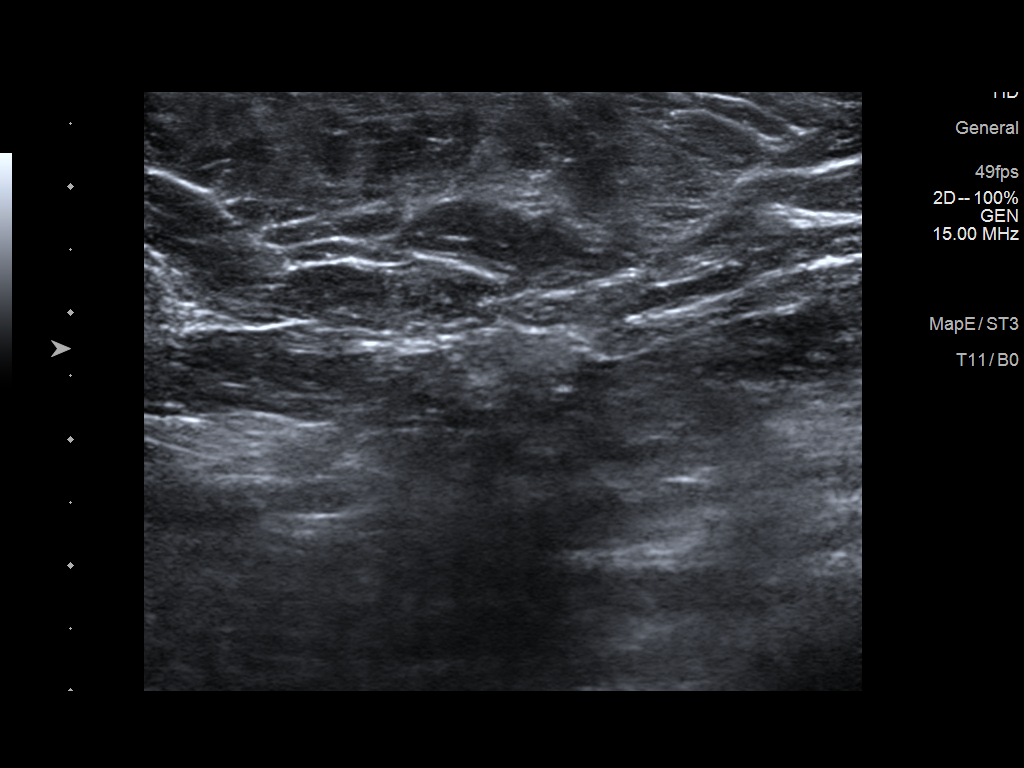
[im 4/4]
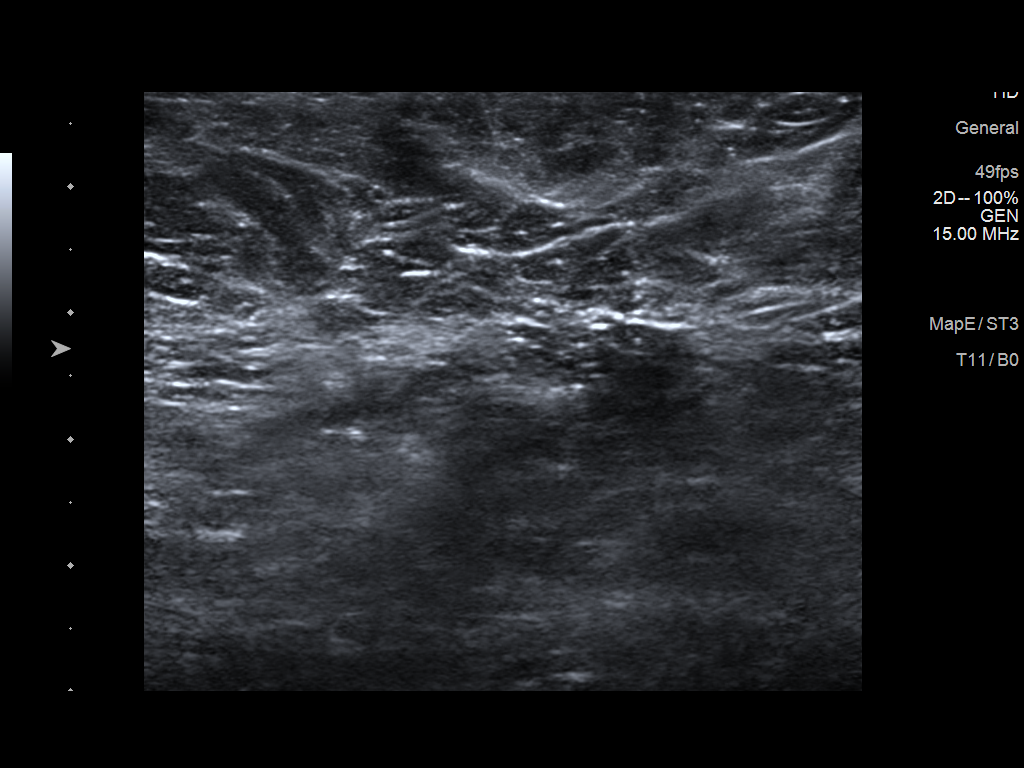

[4 of 4 positions shown; findings below may reference images not displayed]

ACR Breast Density Category c: The breast tissue is heterogeneously
dense, which may obscure small masses.
FINDINGS: Mammogram:

Right breast: A skin BB marks the site of tenderness in the right
axillary region. Spot compression tomosynthesis views were performed
in addition to standard views. There are stable postsurgical
changes. No definite new mass or other abnormality at the site of
tenderness or elsewhere in the right breast.

On physical exam, the patient is tender upon palpation of the
lumpectomy scar in the low axilla. I do not feel a fixed discrete
mass. There are no overt skin changes.

Ultrasound:

Targeted ultrasound is performed at the site of tenderness in the
low right axilla demonstrating scarring but no suspicious mass or
other abnormality.
IMPRESSION: No mammographic or sonographic evidence of malignancy or other
abnormality at the site of tenderness along the patient's scar in
the low right axilla.

RECOMMENDATION:
1. Recommend any further workup of the right axillary tenderness be
on a clinical basis. In discussion with the patient she wonders if
the tenderness is related to muscle strain from her job. The
tenderness could be musculoskeletal, therefore recommend the patient
not perform any strenuous upper body activity for the next two
weeks. Counseled patient to monitor this area over the next month
and follow-up with her doctor if the pain does not improve.

2. Return for annual screening mammography which will be due in
May 2020.

I have discussed the findings and recommendations with the patient.
If applicable, a reminder letter will be sent to the patient
regarding the next appointment.

BI-RADS CATEGORY  1: Negative.

## 2021-12-22 ENCOUNTER — Other Ambulatory Visit: Payer: Self-pay | Admitting: Internal Medicine

## 2021-12-22 DIAGNOSIS — E785 Hyperlipidemia, unspecified: Secondary | ICD-10-CM

## 2021-12-31 ENCOUNTER — Encounter: Payer: Self-pay | Admitting: Internal Medicine

## 2021-12-31 ENCOUNTER — Ambulatory Visit (INDEPENDENT_AMBULATORY_CARE_PROVIDER_SITE_OTHER): Payer: BC Managed Care – PPO | Admitting: Internal Medicine

## 2021-12-31 VITALS — BP 138/86 | HR 86 | Temp 98.3°F | Resp 16 | Ht 66.0 in | Wt 150.0 lb

## 2021-12-31 DIAGNOSIS — R0609 Other forms of dyspnea: Secondary | ICD-10-CM

## 2021-12-31 DIAGNOSIS — I10 Essential (primary) hypertension: Secondary | ICD-10-CM | POA: Diagnosis not present

## 2021-12-31 DIAGNOSIS — Z23 Encounter for immunization: Secondary | ICD-10-CM

## 2021-12-31 LAB — TROPONIN I (HIGH SENSITIVITY): High Sens Troponin I: 4 ng/L (ref 2–17)

## 2021-12-31 LAB — BASIC METABOLIC PANEL
BUN: 15 mg/dL (ref 6–23)
CO2: 30 mEq/L (ref 19–32)
Calcium: 10.1 mg/dL (ref 8.4–10.5)
Chloride: 101 mEq/L (ref 96–112)
Creatinine, Ser: 0.68 mg/dL (ref 0.40–1.20)
GFR: 95.88 mL/min (ref >60.00)
Glucose, Bld: 90 mg/dL (ref 70–99)
Potassium: 3.8 mEq/L (ref 3.5–5.1)
Sodium: 138 mEq/L (ref 135–145)

## 2021-12-31 LAB — BRAIN NATRIURETIC PEPTIDE: Pro B Natriuretic peptide (BNP): 17 pg/mL (ref 0.0–100.0)

## 2021-12-31 NOTE — Patient Instructions (Signed)
Hypertension, Adult High blood pressure (hypertension) is when the force of blood pumping through the arteries is too strong. The arteries are the blood vessels that carry blood from the heart throughout the body. Hypertension forces the heart to work harder to pump blood and may cause arteries to become narrow or stiff. Untreated or uncontrolled hypertension can lead to a heart attack, heart failure, a stroke, kidney disease, and other problems. A blood pressure reading consists of a higher number over a lower number. Ideally, your blood pressure should be below 120/80. The first ("top") number is called the systolic pressure. It is a measure of the pressure in your arteries as your heart beats. The second ("bottom") number is called the diastolic pressure. It is a measure of the pressure in your arteries as the heart relaxes. What are the causes? The exact cause of this condition is not known. There are some conditions that result in high blood pressure. What increases the risk? Certain factors may make you more likely to develop high blood pressure. Some of these risk factors are under your control, including: Smoking. Not getting enough exercise or physical activity. Being overweight. Having too much fat, sugar, calories, or salt (sodium) in your diet. Drinking too much alcohol. Other risk factors include: Having a personal history of heart disease, diabetes, high cholesterol, or kidney disease. Stress. Having a family history of high blood pressure and high cholesterol. Having obstructive sleep apnea. Age. The risk increases with age. What are the signs or symptoms? High blood pressure may not cause symptoms. Very high blood pressure (hypertensive crisis) may cause: Headache. Fast or irregular heartbeats (palpitations). Shortness of breath. Nosebleed. Nausea and vomiting. Vision changes. Severe chest pain, dizziness, and seizures. How is this diagnosed? This condition is diagnosed by  measuring your blood pressure while you are seated, with your arm resting on a flat surface, your legs uncrossed, and your feet flat on the floor. The cuff of the blood pressure monitor will be placed directly against the skin of your upper arm at the level of your heart. Blood pressure should be measured at least twice using the same arm. Certain conditions can cause a difference in blood pressure between your right and left arms. If you have a high blood pressure reading during one visit or you have normal blood pressure with other risk factors, you may be asked to: Return on a different day to have your blood pressure checked again. Monitor your blood pressure at home for 1 week or longer. If you are diagnosed with hypertension, you may have other blood or imaging tests to help your health care provider understand your overall risk for other conditions. How is this treated? This condition is treated by making healthy lifestyle changes, such as eating healthy foods, exercising more, and reducing your alcohol intake. You may be referred for counseling on a healthy diet and physical activity. Your health care provider may prescribe medicine if lifestyle changes are not enough to get your blood pressure under control and if: Your systolic blood pressure is above 130. Your diastolic blood pressure is above 80. Your personal target blood pressure may vary depending on your medical conditions, your age, and other factors. Follow these instructions at home: Eating and drinking  Eat a diet that is high in fiber and potassium, and low in sodium, added sugar, and fat. An example of this eating plan is called the DASH diet. DASH stands for Dietary Approaches to Stop Hypertension. To eat this way: Eat   plenty of fresh fruits and vegetables. Try to fill one half of your plate at each meal with fruits and vegetables. Eat whole grains, such as whole-wheat pasta, brown rice, or whole-grain bread. Fill about one  fourth of your plate with whole grains. Eat or drink low-fat dairy products, such as skim milk or low-fat yogurt. Avoid fatty cuts of meat, processed or cured meats, and poultry with skin. Fill about one fourth of your plate with lean proteins, such as fish, chicken without skin, beans, eggs, or tofu. Avoid pre-made and processed foods. These tend to be higher in sodium, added sugar, and fat. Reduce your daily sodium intake. Many people with hypertension should eat less than 1,500 mg of sodium a day. Do not drink alcohol if: Your health care provider tells you not to drink. You are pregnant, may be pregnant, or are planning to become pregnant. If you drink alcohol: Limit how much you have to: 0-1 drink a day for women. 0-2 drinks a day for men. Know how much alcohol is in your drink. In the [place] one drink equals one 12 oz bottle of beer (355 mL), one 5 oz glass of wine (148 mL), or one 1 oz glass of hard liquor (44 mL). Lifestyle  Work with your health care provider to maintain a healthy body weight or to lose weight. Ask what an ideal weight is for you. Get at least 30 minutes of exercise that causes your heart to beat faster (aerobic exercise) most days of the week. Activities may include walking, swimming, or biking. Include exercise to strengthen your muscles (resistance exercise), such as Pilates or lifting weights, as part of your weekly exercise routine. Try to do these types of exercises for 30 minutes at least 3 days a week. Do not use any products that contain nicotine or tobacco. These products include cigarettes, chewing tobacco, and vaping devices, such as e-cigarettes. If you need help quitting, ask your health care provider. Monitor your blood pressure at home as told by your health care provider. Keep all follow-up visits. This is important. Medicines Take over-the-counter and prescription medicines only as told by your health care provider. Follow directions carefully. Blood  pressure medicines must be taken as prescribed. Do not skip doses of blood pressure medicine. Doing this puts you at risk for problems and can make the medicine less effective. Ask your health care provider about side effects or reactions to medicines that you should watch for. Contact a health care provider if you: Think you are having a reaction to a medicine you are taking. Have headaches that keep coming back (recurring). Feel dizzy. Have swelling in your ankles. Have trouble with your vision. Get help right away if you: Develop a severe headache or confusion. Have unusual weakness or numbness. Feel faint. Have severe pain in your chest or abdomen. Vomit repeatedly. Have trouble breathing. These symptoms may be an emergency. Get help right away. Call 911. Do not wait to see if the symptoms will go away. Do not drive yourself to the hospital. Summary Hypertension is when the force of blood pumping through your arteries is too strong. If this condition is not controlled, it may put you at risk for serious complications. Your personal target blood pressure may vary depending on your medical conditions, your age, and other factors. For most people, a normal blood pressure is less than 120/80. Hypertension is treated with lifestyle changes, medicines, or a combination of both. Lifestyle changes include losing weight, eating a healthy,   low-sodium diet, exercising more, and limiting alcohol. This information is not intended to replace advice given to you by your health care provider. Make sure you discuss any questions you have with your health care provider. Document Revised: 01/23/2021 Document Reviewed: 01/23/2021 Elsevier Patient Education  2023 Elsevier Inc.  

## 2021-12-31 NOTE — Progress Notes (Signed)
Subjective:  Patient ID: Brooke Wood, female    DOB: 1966/01/24  Age: 56 y.o. MRN: [id number]  CC: Hypertension   HPI EULALIE SPEIGHTS presents for f/up -  She is active and only experiences dyspnea on exertion after she is walking several blocks and is on an incline.  She denies chest pain, diaphoresis, edema, or palpitations.  Outpatient Medications Prior to Visit  Medication Sig Dispense Refill   levocetirizine (XYZAL) 5 MG tablet Take 1 tablet (5 mg total) by mouth daily as needed (allergies (typically during Fall months)).     olmesartan (BENICAR) 20 MG tablet Take 1 tablet (20 mg total) by mouth daily. 90 tablet 1   rosuvastatin (CRESTOR) 5 MG tablet Take 1 tablet by mouth once daily 90 tablet 0   spironolactone (ALDACTONE) 25 MG tablet Take 1 tablet (25 mg total) by mouth daily. 90 tablet 1   No facility-administered medications prior to visit.    ROS Review of Systems  Constitutional:  Negative for chills, diaphoresis, fatigue and fever.  HENT: Negative.    Eyes: Negative.   Respiratory:  Positive for shortness of breath. Negative for cough, chest tightness and wheezing.   Cardiovascular:  Negative for chest pain, palpitations and leg swelling.  Gastrointestinal:  Negative for abdominal pain, constipation, diarrhea, nausea and vomiting.  Endocrine: Negative.   Genitourinary: Negative.  Negative for difficulty urinating.  Musculoskeletal: Negative.  Negative for myalgias.  Skin: Negative.   Neurological:  Negative for dizziness, weakness and light-headedness.  Hematological:  Negative for adenopathy. Does not bruise/bleed easily.  Psychiatric/Behavioral: Negative.      Objective:  BP 138/86 (BP Location: Left Arm, Patient Position: Sitting, Cuff Size: Large)   Pulse 86   Temp 98.3 F (36.8 C) (Oral)   Resp 16   Ht '5\' 6"'$  (1.676 m)   Wt 150 lb (68 kg)   LMP 01/07/2016 (Approximate)   SpO2 96%   BMI 24.21 kg/m   BP Readings from Last 3 Encounters:  12/31/21  138/86  12/06/21 (!) 151/90  09/24/21 132/86    Wt Readings from Last 3 Encounters:  12/31/21 150 lb (68 kg)  12/06/21 150 lb (68 kg)  11/06/21 152 lb (68.9 kg)    Physical Exam Vitals reviewed.  Constitutional:      Appearance: She is not ill-appearing.  HENT:     Nose: Nose normal.     Mouth/Throat:     Mouth: Mucous membranes are moist.  Eyes:     General: No scleral icterus.    Conjunctiva/sclera: Conjunctivae normal.  Cardiovascular:     Rate and Rhythm: Normal rate and regular rhythm.     Heart sounds: Normal heart sounds, S1 normal and S2 normal. No murmur heard.    Comments: EKG- NSR, 64 bpm RBBB - old No LVH unchanged Pulmonary:     Breath sounds: No stridor. No wheezing, rhonchi or rales.  Abdominal:     General: Abdomen is flat.     Palpations: There is no mass.     Tenderness: There is no abdominal tenderness. There is no guarding.     Hernia: No hernia is present.  Musculoskeletal:     Cervical back: Neck supple.     Right lower leg: No edema.     Left lower leg: No edema.  Lymphadenopathy:     Cervical: No cervical adenopathy.  Skin:    General: Skin is warm and dry.     Findings: No lesion.  Neurological:  General: No focal deficit present.     Mental Status: She is alert.  Psychiatric:        Mood and Affect: Mood normal.        Behavior: Behavior normal.     Lab Results  Component Value Date   WBC 6.1 06/12/2021   HGB 13.4 06/12/2021   HCT 40.2 06/12/2021   PLT 200 06/12/2021   GLUCOSE 90 12/31/2021   CHOL 186 02/26/2021   TRIG 106.0 02/26/2021   HDL 64.30 02/26/2021   LDLCALC 100 (H) 02/26/2021   ALT 17 06/12/2021   AST 15 06/12/2021   NA 138 12/31/2021   K 3.8 12/31/2021   CL 101 12/31/2021   CREATININE 0.68 12/31/2021   BUN 15 12/31/2021   CO2 30 12/31/2021   TSH 1.15 02/26/2021   HGBA1C 6.2 09/24/2021   MICROALBUR <0.7 09/24/2021    No results found.  Assessment & Plan:   Ryann was seen today for  hypertension.  Diagnoses and all orders for this visit:  Essential hypertension, benign- Her blood pressure is not adequately well controlled.  She will continue working on her lifestyle modifications. -     Basic metabolic panel; Future -     EKG 12-Lead -     Basic metabolic panel  DOE (dyspnea on exertion)- She had a low risk myocardial perfusion imaging about 4 years ago.  I think the DOE is related to poor conditioning.  I have encouraged her to improve her lifestyle modifications. -     Troponin I (High Sensitivity); Future -     Brain natriuretic peptide; Future -     Brain natriuretic peptide -     Troponin I (High Sensitivity)  Other orders -     Flu Vaccine QUAD 6+ mos PF IM (Fluarix Quad PF) -     Zoster Recombinant (Shingrix )   I am having Brooke Wood maintain her spironolactone, olmesartan, levocetirizine, and rosuvastatin.  No orders of the defined types were placed in this encounter.    Follow-up: Return in about 6 months (around 07/02/2022).  [name], MD

## 2022-03-11 ENCOUNTER — Inpatient Hospital Stay: Payer: BC Managed Care – PPO | Attending: Adult Health | Admitting: Adult Health

## 2022-03-11 ENCOUNTER — Telehealth: Payer: Self-pay | Admitting: Adult Health

## 2022-03-11 ENCOUNTER — Telehealth: Payer: Self-pay

## 2022-03-11 ENCOUNTER — Encounter: Payer: Self-pay | Admitting: Adult Health

## 2022-03-11 ENCOUNTER — Other Ambulatory Visit: Payer: Self-pay

## 2022-03-11 VITALS — BP 127/75 | HR 74 | Temp 97.9°F | Resp 16 | Ht 66.0 in | Wt 152.0 lb

## 2022-03-11 DIAGNOSIS — I10 Essential (primary) hypertension: Secondary | ICD-10-CM | POA: Diagnosis not present

## 2022-03-11 DIAGNOSIS — E119 Type 2 diabetes mellitus without complications: Secondary | ICD-10-CM | POA: Diagnosis not present

## 2022-03-11 DIAGNOSIS — Z170 Estrogen receptor positive status [ER+]: Secondary | ICD-10-CM | POA: Diagnosis not present

## 2022-03-11 DIAGNOSIS — Z801 Family history of malignant neoplasm of trachea, bronchus and lung: Secondary | ICD-10-CM | POA: Insufficient documentation

## 2022-03-11 DIAGNOSIS — C50411 Malignant neoplasm of upper-outer quadrant of right female breast: Secondary | ICD-10-CM

## 2022-03-11 DIAGNOSIS — Z853 Personal history of malignant neoplasm of breast: Secondary | ICD-10-CM | POA: Insufficient documentation

## 2022-03-11 DIAGNOSIS — Z923 Personal history of irradiation: Secondary | ICD-10-CM | POA: Insufficient documentation

## 2022-03-11 DIAGNOSIS — Z90710 Acquired absence of both cervix and uterus: Secondary | ICD-10-CM | POA: Insufficient documentation

## 2022-03-11 NOTE — Telephone Encounter (Signed)
Patient called thinking she missed a call from our facility. Informed patient no messages that I can see

## 2022-03-11 NOTE — Progress Notes (Unsigned)
[facility] Follow up:    Brooke L[place], [place] [id number]   DIAGNOSIS: Cancer Staging  Malignant neoplasm of upper-outer quadrant of right breast in female, estrogen receptor positive ([place]) Staging form: Breast, AJCC 7th Edition - Clinical: Stage IIA (T2, N0, cM0) - Unsigned Specimen type: Core Needle Biopsy Laterality: Right Staging comments: Staged at breast conference 12.11.13  - Pathologic: No stage assigned - Unsigned Specimen type: Core Needle Biopsy Laterality: Right   SUMMARY OF ONCOLOGIC HISTORY: (1) status post right breast biopsy on 03/11/2012 for an invasive ductal carcinoma with tubular features, grade 2, ER positive, PR positive, HER-2/neu negative, Ki-67 20%   (2) status post definitive right lumpectomy and sentinel lymph node biopsy on 04/09/2012. 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 One sentinel node had isolated tumor cells. All margins clean.              (a) in the 2018 calcification her tumor would be stage IB   (3) Oncotype DX performed. Her score was 10, giving her a 7% risk of outside the breast recurrence if her only systemic therapy is tamoxifen for 5 years   (4) She completed radiation therapy in April 2015   (5) She began tamoxifen in May 2014 completing 5 years April 2019   (6) status post hysterectomy with left salpingo-oophorectomy January 22, 2017, with benign pathology  CURRENT THERAPY: observation  INTERVAL HISTORY: Brooke Wood 56 y.o. female returns for follow-up and surveillance for her history of breast cancer.  Her most recent mammogram occurred on 06/25/2021 showing no mammographic evidence of malignancy and breast density category C.  She has some cracking in her hands and wants some suggestions for what to do about this.  She also wants some information on exercise programs at the [facility].     She sees her PCP regularly.  She denies any new issues.    Patient Active Problem List   Diagnosis Date Noted   DOE (dyspnea on exertion) 12/31/2021   Hypokalemia 09/24/2021   Pain in left upper arm 09/24/2021   Acute gastroenteritis 07/04/2021   Onychomycosis of toenail 03/16/2020   Bilateral hearing loss 08/16/2019   Genetic testing 05/03/2019   Carcinoma of breast ([place]) 02/19/2019   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   Trochanteric bursitis of left hip 08/11/2018   Type II diabetes mellitus with manifestations ([place]) 07/02/2018   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   Abnormal electrocardiogram (ECG) (EKG) 10/08/2017   Hx of adenomatous colonic polyps 03/03/2014   Routine general medical examination at a health care facility 06/03/2013   Hyperlipidemia with target LDL less than 130 11/24/2012   Malignant neoplasm of upper-outer quadrant of right breast in female, estrogen receptor positive ([place]) 03/06/2012   Goiter 07/26/2009   Essential hypertension, benign 01/19/2007   Allergic rhinitis 01/19/2007    is allergic to aspirin and codeine.  MEDICAL HISTORY: Past Medical History:  Diagnosis Date   Allergy    Neg RAST 2007   Breast cancer [facility]) 2014   right breast   Diabetes mellitus without complication ([place])    Difficult airway for intubation 02/19/2019   Hx of adenomatous colonic polyps 03/03/2014   repeat 2022   Hx of radiation therapy 06/15/12 - 07/29/12   right rbeast, 50.4 Gy x 28 fx, boost to cumulative dose 60.4 gray   Hypertension    Personal history  of radiation therapy 2014   PONV (postoperative nausea and vomiting)    SVD (spontaneous vaginal delivery)    x 1   Vocal cord dysfunction    Neg Methacholine challenge test 2007    SURGICAL HISTORY: Past Surgical History:  Procedure Laterality Date   BREAST LUMPECTOMY Right 2014   BREAST LUMPECTOMY WITH SENTINEL LYMPH NODE BIOPSY  04/09/2012   Procedure: BREAST LUMPECTOMY WITH SENTINEL LYMPH NODE BX;  Surgeon: Merrie Roof, MD;  Location: [facility];  Service: General;  Laterality: Right;   BREAST SURGERY  1985   rt br bx   COLONOSCOPY  03/2014   poylps   COLONOSCOPY WITH PROPOFOL N/A 12/06/2021   Procedure: COLONOSCOPY WITH PROPOFOL;  Surgeon: Gatha Mayer, MD;  Location: [facility];  Service: Gastroenterology;  Laterality: N/A;   CYSTOSCOPY N/A 01/22/2017   Procedure: CYSTOSCOPY;  Surgeon: Bobbye Charleston, MD;  Location: [facility];  Service: Gynecology;  Laterality: N/A;   POLYPECTOMY  12/06/2021   Procedure: POLYPECTOMY;  Surgeon: Gatha Mayer, MD;  Location: [facility];  Service: Gastroenterology;;   ROBOTIC ASSISTED TOTAL HYSTERECTOMY WITH BILATERAL SALPINGO OOPHERECTOMY Bilateral 01/22/2017   Procedure: ROBOTIC ASSISTED TOTAL HYSTERECTOMY WITH BILATERAL SALPINGECTOMY AND LEFT OOPHORECTOMY;  Surgeon: Bobbye Charleston, MD;  Location: [facility];  Service: Gynecology;  Laterality: Bilateral;    SOCIAL HISTORY: Social History   Socioeconomic History   Marital status: Single    Spouse name: Not on file   Number of children: Not on file   Years of education: Not on file   Highest education level: Not on file  Occupational History    Employer: UNEMPLOYED    Comment: [employer]  Tobacco Use   Smoking status: Never   Smokeless tobacco: Never  Vaping Use   Vaping Use: Never used  Substance and Sexual Activity   Alcohol use: No   Drug use: No   Sexual activity: Not Currently    Birth control/protection: None  Other Topics Concern   Not on file  Social History Narrative   Regular exercise-No   Social Determinants of Health   Financial Resource Strain: Not on file  Food Insecurity: Not on file  Transportation Needs: Not on file  Physical Activity: Not on file  Stress: Not on file  Social Connections: Not on file  Intimate Partner Violence: Not on file    FAMILY HISTORY: Family History  Problem Relation Age of Onset   Lung cancer Father 62       smoker    Cancer Father        lung   Hypertension Other    Alcohol abuse Neg Hx    Diabetes Neg Hx    Early death Neg Hx    Hearing loss Neg Hx    Heart disease Neg Hx    Hyperlipidemia Neg Hx    Stroke Neg Hx    Kidney disease Neg Hx    Colon cancer Neg Hx    Breast cancer Neg Hx    Colon polyps Neg Hx    Esophageal cancer Neg Hx    Rectal cancer Neg Hx    Stomach cancer Neg Hx     Review of Systems  Constitutional:  Negative for appetite change, chills, fatigue, fever and unexpected weight change.  HENT:   Negative for hearing loss, lump/mass and trouble swallowing.   Eyes:  Negative for eye problems and icterus.  Respiratory:  Negative for chest tightness, cough and shortness of breath.  Cardiovascular:  Negative for chest pain, leg swelling and palpitations.  Gastrointestinal:  Negative for abdominal distention, abdominal pain, constipation, diarrhea, nausea and vomiting.  Endocrine: Negative for hot flashes.  Genitourinary:  Negative for difficulty urinating.   Musculoskeletal:  Negative for arthralgias.  Skin:  Negative for itching and rash.  Neurological:  Negative for dizziness, extremity weakness, headaches and numbness.  Hematological:  Negative for adenopathy. Does not bruise/bleed easily.  Psychiatric/Behavioral:  Negative for depression. The patient is not nervous/anxious.       PHYSICAL EXAMINATION  ECOG PERFORMANCE STATUS: 1 - Symptomatic but completely ambulatory  Vitals:   03/11/22 1035  BP: 127/75  Pulse: 74  Resp: 16  Temp: 97.9 F (36.6 C)  SpO2: 99%    Physical Exam Constitutional:      General: She is not in acute distress.    Appearance: Normal appearance. She is not toxic-appearing.  HENT:     Head: Normocephalic and atraumatic.  Eyes:     General: No scleral icterus. Cardiovascular:     Rate and Rhythm: Normal rate and regular rhythm.     Pulses: Normal pulses.     Heart sounds: Normal heart sounds.  Pulmonary:     Effort: Pulmonary  effort is normal.     Breath sounds: Normal breath sounds.  Chest:     Comments: Right breast s/p lumpectomy and radiation, no sign of local recurrence, left breast benign Abdominal:     General: Abdomen is flat. Bowel sounds are normal. There is no distension.     Palpations: Abdomen is soft.     Tenderness: There is no abdominal tenderness.  Musculoskeletal:        General: No swelling.     Cervical back: Neck supple.  Lymphadenopathy:     Cervical: No cervical adenopathy.  Skin:    General: Skin is warm and dry.     Findings: No rash.  Neurological:     General: No focal deficit present.     Mental Status: She is alert.  Psychiatric:        Mood and Affect: Mood normal.        Behavior: Behavior normal.     LABORATORY DATA:  CBC    Component Value Date/Time   WBC 6.1 06/12/2021 1520   RBC 4.57 06/12/2021 1520   HGB 13.4 06/12/2021 1520   HGB 13.5 02/08/2021 1149   HGB 14.0 05/16/2016 1005   HCT 40.2 06/12/2021 1520   HCT 42.2 05/16/2016 1005   PLT 200 06/12/2021 1520   PLT 224 02/08/2021 1149   PLT 191 05/16/2016 1005   MCV 88.0 06/12/2021 1520   MCV 87.6 05/16/2016 1005   MCH 29.3 06/12/2021 1520   MCHC 33.3 06/12/2021 1520   RDW 14.2 06/12/2021 1520   RDW 13.4 05/16/2016 1005   LYMPHSABS 2.5 02/08/2021 1149   LYMPHSABS 2.1 05/16/2016 1005   MONOABS 0.5 02/08/2021 1149   MONOABS 0.6 05/16/2016 1005   EOSABS 0.1 02/08/2021 1149   EOSABS 0.1 05/16/2016 1005   BASOSABS 0.0 02/08/2021 1149   BASOSABS 0.0 05/16/2016 1005    CMP     Component Value Date/Time   NA 138 12/31/2021 1027   NA 138 05/16/2016 1006   K 3.8 12/31/2021 1027   K 3.4 (L) 05/16/2016 1006   CL 101 12/31/2021 1027   CL 98 07/24/2012 1248   CO2 30 12/31/2021 1027   CO2 27 05/16/2016 1006   GLUCOSE 90 12/31/2021 1027   GLUCOSE 115  05/16/2016 1006   GLUCOSE 158 (H) 07/24/2012 1248   BUN 15 12/31/2021 1027   BUN 17.3 05/16/2016 1006   CREATININE 0.68 12/31/2021 1027   CREATININE  0.72 02/08/2021 1149   CREATININE 0.7 05/16/2016 1006   CALCIUM 10.1 12/31/2021 1027   CALCIUM 9.7 05/16/2016 1006   PROT 6.9 06/12/2021 1752   PROT 7.2 05/16/2016 1006   ALBUMIN 4.1 06/12/2021 1752   ALBUMIN 3.8 05/16/2016 1006   AST 15 06/12/2021 1752   AST 12 (L) 02/08/2021 1149   AST 12 05/16/2016 1006   ALT 17 06/12/2021 1752   ALT 15 02/08/2021 1149   ALT 15 05/16/2016 1006   ALKPHOS 114 06/12/2021 1752   ALKPHOS 98 05/16/2016 1006   BILITOT 0.6 06/12/2021 1752   BILITOT 0.4 02/08/2021 1149   BILITOT 0.25 05/16/2016 1006   GFRNONAA >60 06/12/2021 1752   GFRNONAA >60 02/08/2021 1149   GFRAA >60 06/12/2017 0946         ASSESSMENT and THERAPY PLAN:   No problem-specific Assessment & Plan notes found for this encounter.    All questions were answered. The patient knows to call the clinic with any problems, questions or concerns. We can certainly see the patient much sooner if necessary.  Total encounter time:*** minutes*in face-to-face visit time, chart review, lab review, care coordination, order entry, and documentation of the encounter time.    Wilber Bihari, NP 03/11/22 11:11 AM Medical Oncology and Hematology [facility] [place], [place] [address] Tel. [phone]    Fax. [phone]  *Total Encounter Time as defined by the Centers for Medicare and Medicaid Services includes, in addition to the face-to-face time of a patient visit (documented in the note above) non-face-to-face time: obtaining and reviewing outside history, ordering and reviewing medications, tests or procedures, care coordination (communications with other health care professionals or caregivers) and documentation in the medical record.

## 2022-03-11 NOTE — Telephone Encounter (Signed)
CSW attempted to contact patient per RN request.  Left vm.  Consulted Wilber Bihari, NP.  Was informed patient requested resources.  Since patient sees NP for  observation/survivorship, referred patient to the [place] [facility] [place] at [facility] where she is a current patient.

## 2022-03-12 NOTE — Assessment & Plan Note (Signed)
Britteny is a 56 year old woman with history of right-sided stage IIa estrogen positive breast cancer diagnosed in December 2013 status post lumpectomy, adjuvant radiation, and adjuvant tamoxifen for 5 years completing therapy in April 2019.  Kylin has no clinical or radiographic signs of breast cancer recurrence.  She will continue to undergo annual mammograms next due in March 2024.  I recommended continued healthy diet and exercise which she will do.  We also discussed the area on her hands with her skin and I gave her an Ovadia lotion to try to see if that would help alleviate some of the dryness and cracking.  We will see her back in 1 year for continued long-term surveillance she knows to call for questions between now and her next visit.

## 2022-03-26 ENCOUNTER — Ambulatory Visit (INDEPENDENT_AMBULATORY_CARE_PROVIDER_SITE_OTHER): Payer: BC Managed Care – PPO | Admitting: Internal Medicine

## 2022-03-26 ENCOUNTER — Encounter: Payer: Self-pay | Admitting: Internal Medicine

## 2022-03-26 VITALS — BP 142/88 | HR 81 | Temp 98.1°F | Resp 16 | Ht 66.0 in | Wt 152.0 lb

## 2022-03-26 DIAGNOSIS — E785 Hyperlipidemia, unspecified: Secondary | ICD-10-CM

## 2022-03-26 DIAGNOSIS — Z0001 Encounter for general adult medical examination with abnormal findings: Secondary | ICD-10-CM

## 2022-03-26 DIAGNOSIS — E118 Type 2 diabetes mellitus with unspecified complications: Secondary | ICD-10-CM

## 2022-03-26 DIAGNOSIS — E876 Hypokalemia: Secondary | ICD-10-CM

## 2022-03-26 DIAGNOSIS — E049 Nontoxic goiter, unspecified: Secondary | ICD-10-CM

## 2022-03-26 DIAGNOSIS — L309 Dermatitis, unspecified: Secondary | ICD-10-CM

## 2022-03-26 DIAGNOSIS — I10 Essential (primary) hypertension: Secondary | ICD-10-CM

## 2022-03-26 LAB — LIPID PANEL
Cholesterol: 168 mg/dL (ref 0–200)
HDL: 74.6 mg/dL (ref >39.00)
LDL Cholesterol: 84 mg/dL (ref 0–99)
NonHDL: 93.49
Total CHOL/HDL Ratio: 2
Triglycerides: 49 mg/dL (ref 0.0–149.0)
VLDL: 9.8 mg/dL (ref 0.0–40.0)

## 2022-03-26 LAB — CBC WITH DIFFERENTIAL/PLATELET
Basophils Absolute: 0.1 10*3/uL (ref 0.0–0.1)
Basophils Relative: 1.1 % (ref 0.0–3.0)
Eosinophils Absolute: 0.1 10*3/uL (ref 0.0–0.7)
Eosinophils Relative: 0.8 % (ref 0.0–5.0)
HCT: 41.6 % (ref 36.0–46.0)
Hemoglobin: 13.5 g/dL (ref 12.0–15.0)
Lymphocytes Relative: 49.5 % — ABNORMAL HIGH (ref 12.0–46.0)
Lymphs Abs: 3.3 10*3/uL (ref 0.7–4.0)
MCHC: 32.5 g/dL (ref 30.0–36.0)
MCV: 88.9 fl (ref 78.0–100.0)
Monocytes Absolute: 0.7 10*3/uL (ref 0.1–1.0)
Monocytes Relative: 10.1 % (ref 3.0–12.0)
Neutro Abs: 2.6 10*3/uL (ref 1.4–7.7)
Neutrophils Relative %: 38.5 % — ABNORMAL LOW (ref 43.0–77.0)
Platelets: 211 10*3/uL (ref 150.0–400.0)
RBC: 4.67 Mil/uL (ref 3.87–5.11)
RDW: 13.7 % (ref 11.5–15.5)
WBC: 6.6 10*3/uL (ref 4.0–10.5)

## 2022-03-26 LAB — TSH: TSH: 1.33 u[IU]/mL (ref 0.35–5.50)

## 2022-03-26 LAB — HEMOGLOBIN A1C: Hgb A1c MFr Bld: 6.1 % (ref 4.6–6.5)

## 2022-03-26 NOTE — Patient Instructions (Signed)
feHealth Maintenance, Female Adopting a healthy lifestyle and getting preventive care are important in promoting health and wellness. Ask your health care provider about: The right schedule for you to have regular tests and exams. Things you can do on your own to prevent diseases and keep yourself healthy. What should I know about diet, weight, and exercise? Eat a healthy diet  Eat a diet that includes plenty of vegetables, fruits, low-fat dairy products, and lean protein. Do not eat a lot of foods that are high in solid fats, added sugars, or sodium. Maintain a healthy weight Body mass index (BMI) is used to identify weight problems. It estimates body fat based on height and weight. Your health care provider can help determine your BMI and help you achieve or maintain a healthy weight. Get regular exercise Get regular exercise. This is one of the most important things you can do for your health. Most adults should: Exercise for at least 150 minutes each week. The exercise should increase your heart rate and make you sweat (moderate-intensity exercise). Do strengthening exercises at least twice a week. This is in addition to the moderate-intensity exercise. Spend less time sitting. Even light physical activity can be beneficial. Watch cholesterol and blood lipids Have your blood tested for lipids and cholesterol at 58 years of age, then have this test every 5 years. Have your cholesterol levels checked more often if: Your lipid or cholesterol levels are high. You are older than 58 years of age. You are at high risk for heart disease. What should I know about cancer screening? Depending on your health history and family history, you may need to have cancer screening at various ages. This may include screening for: Breast cancer. Cervical cancer. Colorectal cancer. Skin cancer. Lung cancer. What should I know about heart disease, diabetes, and high blood pressure? Blood pressure and heart  disease High blood pressure causes heart disease and increases the risk of stroke. This is more likely to develop in people who have high blood pressure readings or are overweight. Have your blood pressure checked: Every 3-5 years if you are 38-51 years of age. Every year if you are 63 years old or older. Diabetes Have regular diabetes screenings. This checks your fasting blood sugar level. Have the screening done: Once every three years after age 68 if you are at a normal weight and have a low risk for diabetes. More often and at a younger age if you are overweight or have a high risk for diabetes. What should I know about preventing infection? Hepatitis B If you have a higher risk for hepatitis B, you should be screened for this virus. Talk with your health care provider to find out if you are at risk for hepatitis B infection. Hepatitis C Testing is recommended for: Everyone born from [birth year] through 1967. Anyone with known risk factors for hepatitis C. Sexually transmitted infections (STIs) Get screened for STIs, including gonorrhea and chlamydia, if: You are sexually active and are younger than 58 years of age. You are older than 58 years of age and your health care provider tells you that you are at risk for this type of infection. Your sexual activity has changed since you were last screened, and you are at increased risk for chlamydia or gonorrhea. Ask your health care provider if you are at risk. Ask your health care provider about whether you are at high risk for HIV. Your health care provider may recommend a prescription medicine to help prevent HIV  infection. If you choose to take medicine to prevent HIV, you should first get tested for HIV. You should then be tested every 3 months for as long as you are taking the medicine. Pregnancy If you are about to stop having your period (premenopausal) and you may become pregnant, seek counseling before you get pregnant. Take 400 to 800  micrograms (mcg) of folic acid every day if you become pregnant. Ask for birth control (contraception) if you want to prevent pregnancy. Osteoporosis and menopause Osteoporosis is a disease in which the bones lose minerals and strength with aging. This can result in bone fractures. If you are 65 years old or older, or if you are at risk for osteoporosis and fractures, ask your health care provider if you should: Be screened for bone loss. Take a calcium or vitamin D supplement to lower your risk of fractures. Be given hormone replacement therapy (HRT) to treat symptoms of menopause. Follow these instructions at home: Alcohol use Do not drink alcohol if: Your health care provider tells you not to drink. You are pregnant, may be pregnant, or are planning to become pregnant. If you drink alcohol: Limit how much you have to: 0-1 drink a day. Know how much alcohol is in your drink. In the [place] one drink equals one 12 oz bottle of beer (355 mL), one 5 oz glass of wine (148 mL), or one 1 oz glass of hard liquor (44 mL). Lifestyle Do not use any products that contain nicotine or tobacco. These products include cigarettes, chewing tobacco, and vaping devices, such as e-cigarettes. If you need help quitting, ask your health care provider. Do not use street drugs. Do not share needles. Ask your health care provider for help if you need support or information about quitting drugs. General instructions Schedule regular health, dental, and eye exams. Stay current with your vaccines. Tell your health care provider if: You often feel depressed. You have ever been abused or do not feel safe at home. Summary Adopting a healthy lifestyle and getting preventive care are important in promoting health and wellness. Follow your health care provider's instructions about healthy diet, exercising, and getting tested or screened for diseases. Follow your health care provider's instructions on monitoring your  cholesterol and blood pressure. This information is not intended to replace advice given to you by your health care provider. Make sure you discuss any questions you have with your health care provider. Document Revised: 08/07/2020 Document Reviewed: 08/07/2020 Elsevier Patient Education  2023 [employer]  

## 2022-03-26 NOTE — Progress Notes (Unsigned)
Subjective:  Patient ID: Brooke Wood, female    DOB: 1965/07/08  Age: 56 y.o. MRN: [id number]  CC: Annual Exam, Hypertension, Diabetes, and Hyperlipidemia   HPI [name] presents for a CPX and f/up -   She is active and denies DOE, CP, SOB, edema.  Outpatient Medications Prior to Visit  Medication Sig Dispense Refill   levocetirizine (XYZAL) 5 MG tablet Take 1 tablet (5 mg total) by mouth daily as needed (allergies (typically during Fall months)).     olmesartan (BENICAR) 20 MG tablet Take 1 tablet (20 mg total) by mouth daily. 90 tablet 1   rosuvastatin (CRESTOR) 5 MG tablet Take 1 tablet by mouth once daily 90 tablet 0   spironolactone (ALDACTONE) 25 MG tablet Take 1 tablet (25 mg total) by mouth daily. 90 tablet 1   No facility-administered medications prior to visit.    ROS Review of Systems  Constitutional: Negative.  Negative for diaphoresis and fatigue.  HENT: Negative.    Eyes: Negative.   Respiratory:  Negative for cough, chest tightness and wheezing.   Cardiovascular:  Negative for chest pain, palpitations and leg swelling.  Gastrointestinal:  Negative for abdominal pain, diarrhea, nausea and vomiting.  Endocrine: Negative.   Genitourinary: Negative.  Negative for difficulty urinating.  Musculoskeletal: Negative.  Negative for arthralgias and myalgias.  Skin:  Positive for rash.  Allergic/Immunologic: Negative.   Neurological: Negative.  Negative for dizziness.  Hematological:  Negative for adenopathy. Does not bruise/bleed easily.  Psychiatric/Behavioral: Negative.      Objective:  BP (!) 142/88 (BP Location: Left Arm, Patient Position: Standing, Cuff Size: Large)   Pulse 81   Temp 98.1 F (36.7 C) (Oral)   Resp 16   Ht '5\' 6"'$  (1.676 m)   Wt 152 lb (68.9 kg)   LMP 01/07/2016 (Approximate)   SpO2 98%   BMI 24.53 kg/m   BP Readings from Last 3 Encounters:  03/26/22 (!) 142/88  03/11/22 127/75  12/31/21 138/86    Wt Readings from Last 3  Encounters:  03/26/22 152 lb (68.9 kg)  03/11/22 152 lb (68.9 kg)  12/31/21 150 lb (68 kg)    Physical Exam Vitals reviewed.  Constitutional:      Appearance: Normal appearance.  HENT:     Mouth/Throat:     Mouth: Mucous membranes are moist.  Eyes:     General: No scleral icterus.    Conjunctiva/sclera: Conjunctivae normal.  Neck:     Thyroid: Thyromegaly present. No thyroid mass or thyroid tenderness.     Comments: Left goiter Cardiovascular:     Rate and Rhythm: Normal rate and regular rhythm.     Heart sounds: No murmur heard. Pulmonary:     Effort: Pulmonary effort is normal.     Breath sounds: No stridor. No wheezing, rhonchi or rales.  Abdominal:     Palpations: There is no mass.     Tenderness: There is no abdominal tenderness. There is no guarding.     Hernia: No hernia is present.  Musculoskeletal:        General: Normal range of motion.     Cervical back: Neck supple.     Right lower leg: No edema.     Left lower leg: No edema.  Lymphadenopathy:     Cervical: No cervical adenopathy.  Skin:    General: Skin is warm and dry.     Coloration: Skin is not pale.     Findings: Rash present.  Comments: Over the dorsum of both hands there is scaling and faint erythema.  There is no fissuring, exudate, induration, fluctuance, or tenderness.  Neurological:     General: No focal deficit present.     Mental Status: She is alert.  Psychiatric:        Mood and Affect: Mood normal.        Behavior: Behavior normal.     Lab Results  Component Value Date   WBC 6.6 03/26/2022   HGB 13.5 03/26/2022   HCT 41.6 03/26/2022   PLT 211.0 03/26/2022   GLUCOSE 90 12/31/2021   CHOL 168 03/26/2022   TRIG 49.0 03/26/2022   HDL 74.60 03/26/2022   LDLCALC 84 03/26/2022   ALT 17 06/12/2021   AST 15 06/12/2021   NA 138 12/31/2021   K 3.8 12/31/2021   CL 101 12/31/2021   CREATININE 0.68 12/31/2021   BUN 15 12/31/2021   CO2 30 12/31/2021   TSH 1.33 03/26/2022   HGBA1C  6.1 03/26/2022   MICROALBUR <0.7 09/24/2021    No results found.  Assessment & Plan:   Brooke Wood was seen today for annual exam, hypertension, diabetes and hyperlipidemia.  Diagnoses and all orders for this visit:  Type II diabetes mellitus with manifestations ([place])- Her blood sugar is well controlled. -     Hemoglobin A1c; Future -     Cancel: Hepatic function panel; Future -     HM Diabetes Foot Exam -     Hemoglobin A1c -     olmesartan (BENICAR) 20 MG tablet; Take 1 tablet (20 mg total) by mouth daily.  Essential hypertension, benign- She has not achieved her BP goal of 130/80. She will continue working on her lifestyle modifications. -     CBC with Differential/Platelet; Future -     TSH; Future -     Cancel: Hepatic function panel; Future -     TSH -     CBC with Differential/Platelet -     olmesartan (BENICAR) 20 MG tablet; Take 1 tablet (20 mg total) by mouth daily. -     spironolactone (ALDACTONE) 25 MG tablet; Take 1 tablet (25 mg total) by mouth daily.  Hyperlipidemia with target LDL less than 130- LDL goal achieved. Doing well on the statin  -     Lipid panel; Future -     Cancel: Hepatic function panel; Future -     Lipid panel -     rosuvastatin (CRESTOR) 5 MG tablet; Take 1 tablet (5 mg total) by mouth daily.  Goiter- She is euthyroid. -     TSH; Future -     US THYROID; Future -     TSH  Encounter for general adult medical examination with abnormal findings- Exam completed, labs reviewed, cancer screenings are UTD, pt ed material was given.  Chronic eczema of hand -     triamcinolone cream (KENALOG) 0.5 %; Apply 1 Application topically 3 (three) times daily.  Hypokalemia -     spironolactone (ALDACTONE) 25 MG tablet; Take 1 tablet (25 mg total) by mouth daily.   I have changed Destyne I. Holte's rosuvastatin. I am also having her start on triamcinolone cream. Additionally, I am having her maintain her levocetirizine, olmesartan, and spironolactone.  Meds  ordered this encounter  Medications   triamcinolone cream (KENALOG) 0.5 %    Sig: Apply 1 Application topically 3 (three) times daily.    Dispense:  90 g    Refill:  2  rosuvastatin (CRESTOR) 5 MG tablet    Sig: Take 1 tablet (5 mg total) by mouth daily.    Dispense:  90 tablet    Refill:  1   olmesartan (BENICAR) 20 MG tablet    Sig: Take 1 tablet (20 mg total) by mouth daily.    Dispense:  90 tablet    Refill:  1   spironolactone (ALDACTONE) 25 MG tablet    Sig: Take 1 tablet (25 mg total) by mouth daily.    Dispense:  90 tablet    Refill:  1     Follow-up: Return in about 6 months (around 09/25/2022).  [name], MD

## 2022-03-27 ENCOUNTER — Encounter: Payer: Self-pay | Admitting: Internal Medicine

## 2022-03-27 MED ORDER — ROSUVASTATIN CALCIUM 5 MG PO TABS: 5.0000 mg | ORAL_TABLET | Freq: Every day | ORAL | 1 refills | Status: DC

## 2022-03-27 MED ORDER — SPIRONOLACTONE 25 MG PO TABS: 25.0000 mg | ORAL_TABLET | Freq: Every day | ORAL | 1 refills | Status: DC

## 2022-03-27 MED ORDER — OLMESARTAN MEDOXOMIL 20 MG PO TABS: 20.0000 mg | ORAL_TABLET | Freq: Every day | ORAL | 1 refills | Status: DC

## 2022-03-27 MED ORDER — TRIAMCINOLONE ACETONIDE 0.5 % EX CREA: 1.0000 | TOPICAL_CREAM | Freq: Three times a day (TID) | CUTANEOUS | 2 refills | Status: DC

## 2022-03-29 ENCOUNTER — Other Ambulatory Visit: Payer: Self-pay | Admitting: Internal Medicine

## 2022-03-29 DIAGNOSIS — J301 Allergic rhinitis due to pollen: Secondary | ICD-10-CM

## 2022-04-02 ENCOUNTER — Ambulatory Visit: Payer: BC Managed Care – PPO | Admitting: Internal Medicine

## 2022-04-08 ENCOUNTER — Ambulatory Visit: Payer: BC Managed Care – PPO | Admitting: Internal Medicine

## 2022-04-17 ENCOUNTER — Telehealth: Payer: Self-pay | Admitting: Internal Medicine

## 2022-04-17 NOTE — Telephone Encounter (Signed)
Patient called and would like to know if she can go to the [facility] eye care to get her eyes checked. She would like a call back at [phone].

## 2022-04-17 NOTE — Telephone Encounter (Signed)
Called pt, LVM informing pt that it would be fine to go there of she chooses.

## 2022-04-22 ENCOUNTER — Other Ambulatory Visit: Payer: Self-pay | Admitting: Obstetrics and Gynecology

## 2022-04-22 DIAGNOSIS — Z1231 Encounter for screening mammogram for malignant neoplasm of breast: Secondary | ICD-10-CM

## 2022-04-25 ENCOUNTER — Telehealth: Payer: Self-pay | Admitting: Internal Medicine

## 2022-04-25 NOTE — Telephone Encounter (Signed)
Patient said everytime she is around strong scents she loses her voice. She called to schedule with Dr. Ronnald Ramp, but he was booked out. She said she will try to call Monday to see if there are any cancellations. She only wants to be seen by Dr. Ronnald Ramp

## 2022-04-26 NOTE — Telephone Encounter (Signed)
Called patient and left voicemail to try to schedule patient. I told her to give [place] a call back.

## 2022-04-29 ENCOUNTER — Ambulatory Visit
Admission: RE | Admit: 2022-04-29 | Discharge: 2022-04-29 | Disposition: A | Discharge status: Home / Self Care | Payer: BC Managed Care – PPO | Source: Ambulatory Visit | Attending: Internal Medicine | Admitting: Internal Medicine

## 2022-04-29 DIAGNOSIS — E049 Nontoxic goiter, unspecified: Secondary | ICD-10-CM

## 2022-04-29 DIAGNOSIS — E041 Nontoxic single thyroid nodule: Secondary | ICD-10-CM | POA: Diagnosis not present

## 2022-04-30 ENCOUNTER — Encounter: Payer: Self-pay | Admitting: Internal Medicine

## 2022-04-30 NOTE — Telephone Encounter (Signed)
Patient called stating she has some swelling around her neck, and it is hard for her to swallow, patient has been added to see Dr. Ronnald Ramp on Thursday at 3:40. Patient would like a call back to discuss her neck at [phone], says voicemail can be left if she doesn't answer.

## 2022-05-01 NOTE — Telephone Encounter (Signed)
PT calls back today in regards to the neck swelling. PT stated swelling isn't as bad anymore but still present.   PT also calls in regards to the results for their thyroid imaging. Would like someone to call and go over these when possible.  With the neck swelling PT was concerned about waiting until tomorrow and was wanting to know if Dr.Jones would want her to be seen in sooner?  CB: [phone]  PT states she is at work and if she can not make it to her phone she would be alright with the results being left on the answering machine.

## 2022-05-02 ENCOUNTER — Ambulatory Visit (INDEPENDENT_AMBULATORY_CARE_PROVIDER_SITE_OTHER): Payer: BC Managed Care – PPO | Admitting: Internal Medicine

## 2022-05-02 ENCOUNTER — Encounter: Payer: Self-pay | Admitting: Internal Medicine

## 2022-05-02 VITALS — BP 132/80 | HR 75 | Temp 98.0°F | Resp 16 | Ht 66.0 in | Wt 155.0 lb

## 2022-05-02 DIAGNOSIS — E049 Nontoxic goiter, unspecified: Secondary | ICD-10-CM

## 2022-05-02 DIAGNOSIS — I10 Essential (primary) hypertension: Secondary | ICD-10-CM | POA: Diagnosis not present

## 2022-05-02 DIAGNOSIS — E118 Type 2 diabetes mellitus with unspecified complications: Secondary | ICD-10-CM

## 2022-05-02 NOTE — Progress Notes (Signed)
Subjective:  Patient ID: Brooke Wood, female    DOB: 1966-01-22  Age: 57 y.o. MRN: [id number]  CC: Hypertension, Diabetes, and Hyperlipidemia   HPI [name] presents for f/up ---  She returns for follow-up after a recent thyroid ultrasound showed a benign nodule.  She denies neck pain, swelling, odynophagia, or dysphagia.  She complains of weight gain.  Outpatient Medications Prior to Visit  Medication Sig Dispense Refill   levocetirizine (XYZAL) 5 MG tablet TAKE 1 TABLET BY MOUTH ONCE DAILY IN THE EVENING 90 tablet 0   olmesartan (BENICAR) 20 MG tablet Take 1 tablet (20 mg total) by mouth daily. 90 tablet 1   rosuvastatin (CRESTOR) 5 MG tablet Take 1 tablet (5 mg total) by mouth daily. 90 tablet 1   spironolactone (ALDACTONE) 25 MG tablet Take 1 tablet (25 mg total) by mouth daily. 90 tablet 1   triamcinolone cream (KENALOG) 0.5 % Apply 1 Application topically 3 (three) times daily. 90 g 2   No facility-administered medications prior to visit.    ROS Review of Systems  Constitutional:  Positive for unexpected weight change. Negative for chills, diaphoresis and fatigue.  HENT: Negative.    Eyes: Negative.   Respiratory:  Negative for cough, chest tightness, shortness of breath and wheezing.   Cardiovascular:  Negative for chest pain, palpitations and leg swelling.  Gastrointestinal:  Negative for abdominal pain, constipation, diarrhea, nausea and vomiting.  Endocrine: Negative.   Genitourinary: Negative.  Negative for difficulty urinating.  Musculoskeletal: Negative.   Skin: Negative.   Neurological:  Negative for dizziness, weakness and light-headedness.  Hematological:  Negative for adenopathy. Does not bruise/bleed easily.  Psychiatric/Behavioral: Negative.      Objective:  BP 132/80   Pulse 75   Temp 98 F (36.7 C) (Oral)   Resp 16   Ht '5\' 6"'$  (1.676 m)   Wt 155 lb (70.3 kg)   LMP 01/07/2016 (Approximate)   SpO2 97%   BMI 25.02 kg/m   BP Readings from  Last 3 Encounters:  05/02/22 132/80  03/26/22 (!) 142/88  03/11/22 127/75    Wt Readings from Last 3 Encounters:  05/02/22 155 lb (70.3 kg)  03/26/22 152 lb (68.9 kg)  03/11/22 152 lb (68.9 kg)    Physical Exam Vitals reviewed.  HENT:     Mouth/Throat:     Mouth: Mucous membranes are moist.  Eyes:     General: No scleral icterus.    Conjunctiva/sclera: Conjunctivae normal.  Neck:     Thyroid: No thyroid mass, thyromegaly or thyroid tenderness.  Cardiovascular:     Rate and Rhythm: Normal rate and regular rhythm.     Heart sounds: No murmur heard. Pulmonary:     Effort: Pulmonary effort is normal.     Breath sounds: No stridor. No wheezing, rhonchi or rales.  Abdominal:     General: Abdomen is flat.     Palpations: There is no mass.     Tenderness: There is no abdominal tenderness. There is no guarding.     Hernia: No hernia is present.  Musculoskeletal:        General: Normal range of motion.     Cervical back: Neck supple. No tenderness.     Right lower leg: No edema.     Left lower leg: No edema.  Lymphadenopathy:     Cervical: No cervical adenopathy.     Right cervical: No superficial cervical adenopathy.    Left cervical: No superficial cervical adenopathy.  Skin:    General: Skin is warm and dry.     Findings: No rash.  Neurological:     General: No focal deficit present.     Mental Status: She is alert.  Psychiatric:        Mood and Affect: Mood normal.        Behavior: Behavior normal.     Lab Results  Component Value Date   WBC 6.6 03/26/2022   HGB 13.5 03/26/2022   HCT 41.6 03/26/2022   PLT 211.0 03/26/2022   GLUCOSE 90 12/31/2021   CHOL 168 03/26/2022   TRIG 49.0 03/26/2022   HDL 74.60 03/26/2022   LDLCALC 84 03/26/2022   ALT 17 06/12/2021   AST 15 06/12/2021   NA 138 12/31/2021   K 3.8 12/31/2021   CL 101 12/31/2021   CREATININE 0.68 12/31/2021   BUN 15 12/31/2021   CO2 30 12/31/2021   TSH 1.33 03/26/2022   HGBA1C 6.1 03/26/2022    MICROALBUR <0.7 09/24/2021    US THYROID  Result Date: 04/29/2022 CLINICAL DATA:  Goiter follow-up EXAM: THYROID ULTRASOUND TECHNIQUE: Ultrasound examination of the thyroid gland and adjacent soft tissues was performed. COMPARISON:  12/14/2013 05/31/2011 FINDINGS: Parenchymal Echotexture: Mildly heterogenous Isthmus: 0.5 cm Right lobe: 5.0 x 2.0 x 2.1 cm Left lobe: 5.1 x 2.1 x 2.0 cm _________________________________________________________ Estimated total number of nodules >/= 1 cm: 1 Number of spongiform nodules >/=  2 cm not described below (TR1): 0 Number of mixed cystic and solid nodules >/= 1.5 cm not described below (TR2): 0 _________________________________________________________ Nodule # 1: Location: Right; superior Maximum size: 1.2 cm; Other 2 dimensions: 1.1 x 0.6 cm Composition: solid/almost completely solid (2) Echogenicity: hypoechoic (2) Shape: not taller-than-wide (0) Margins: ill-defined (0) Echogenic foci: none (0) ACR TI-RADS total points: 4. ACR TI-RADS risk category: TR4 (4-6 points). ACR TI-RADS recommendations: *Given size (>/= 1 - 1.4 cm) and appearance, a follow-up ultrasound in 1 year should be considered based on TI-RADS criteria. IMPRESSION: Nodule 1 (TI-RADS 4), measuring 1.2 cm, located in the superior right thyroid lobe, meets criteria for imaging follow-up. Annual ultrasound surveillance is recommended until 5 years of stability is documented. The above is in keeping with the ACR TI-RADS recommendations - J Am Coll Radiol 2017;14:587-595. Electronically Signed   By: Miachel Roux M.D.   On: 04/29/2022 12:53    Assessment & Plan:   Malaiah was seen today for hypertension, diabetes and hyperlipidemia.  Diagnoses and all orders for this visit:  Goiter- This is benign based on recent ultrasound.  She is euthyroid.  Will recheck in 1 year.  Essential hypertension, benign- Her blood pressure is well-controlled.  Type II diabetes mellitus with manifestations ([place])- Her  blood sugar is well-controlled.   I am having Brooke Wood maintain her triamcinolone cream, rosuvastatin, olmesartan, spironolactone, and levocetirizine.  No orders of the defined types were placed in this encounter.    Follow-up: Return in about 6 months (around 10/31/2022).  [name], MD

## 2022-05-02 NOTE — Patient Instructions (Signed)

## 2022-05-11 ENCOUNTER — Other Ambulatory Visit: Payer: Self-pay

## 2022-05-11 ENCOUNTER — Encounter (HOSPITAL_COMMUNITY): Payer: Self-pay | Admitting: Emergency Medicine

## 2022-05-11 ENCOUNTER — Ambulatory Visit (HOSPITAL_COMMUNITY)
Admission: EM | Admit: 2022-05-11 | Discharge: 2022-05-11 | Disposition: A | Discharge status: Home / Self Care | Payer: BC Managed Care – PPO | Attending: Emergency Medicine | Admitting: Emergency Medicine

## 2022-05-11 DIAGNOSIS — R0989 Other specified symptoms and signs involving the circulatory and respiratory systems: Secondary | ICD-10-CM

## 2022-05-11 DIAGNOSIS — H1031 Unspecified acute conjunctivitis, right eye: Secondary | ICD-10-CM | POA: Diagnosis not present

## 2022-05-11 MED ORDER — MOXIFLOXACIN HCL 0.5 % OP SOLN: 1.0000 [drp] | Freq: Three times a day (TID) | OPHTHALMIC | 0 refills | Status: DC

## 2022-05-11 NOTE — Discharge Instructions (Addendum)
Eye drops 3x daily for the next 5-7 days  Once daily zyrtec or allegra can help runny nose  You can use honey, cough drops, and lots of fluids to help with throat.

## 2022-05-11 NOTE — ED Triage Notes (Addendum)
2 days ago, patient started with laryngitis.  Patient woke with right eye red, discharge from  eye.   Patient does not wear contacts Patient has not used any medications  Patient reports eye itches, no change in vision

## 2022-05-11 NOTE — ED Provider Notes (Signed)
[facility]    CSN: [id number] Arrival date & time: 05/11/22  1619      History   Chief Complaint Chief Complaint  Patient presents with   Laryngitis    HPI Brooke Wood is a 57 y.o. female.  This morning woke up with right eye matted shut, mucousy drainage Eye itchy but not painful.  A little red. Additionally reports a raspy voice for the last 2 days.  No sore throat.  No trouble swallowing. No fevers No known sick contacts  Past Medical History:  Diagnosis Date   Allergy    Neg RAST 2007   Breast cancer ([place]) 2014   right breast   Diabetes mellitus without complication ([place])    Difficult airway for intubation 02/19/2019   Hx of adenomatous colonic polyps 03/03/2014   repeat 2022   Hx of radiation therapy 06/15/12 - 07/29/12   right rbeast, 50.4 Gy x 28 fx, boost to cumulative dose 60.4 gray   Hypertension    Personal history of radiation therapy 2014   PONV (postoperative nausea and vomiting)    SVD (spontaneous vaginal delivery)    x 1   Vocal cord dysfunction    Neg Methacholine challenge test 2007    Patient Active Problem List   Diagnosis Date Noted   Encounter for general adult medical examination with abnormal findings 03/26/2022   Onychomycosis of toenail 03/16/2020   Bilateral hearing loss 08/16/2019   Genetic testing 05/03/2019   Carcinoma of breast ([place]) 02/19/2019   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   Trochanteric bursitis of left hip 08/11/2018   Type II diabetes mellitus with manifestations ([place]) 07/02/2018   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   Abnormal electrocardiogram (ECG) (EKG) 10/08/2017   Hx of adenomatous colonic polyps 03/03/2014   Routine general medical examination at a health care facility 06/03/2013   Hyperlipidemia with target LDL less than 130 11/24/2012   Malignant neoplasm of upper-outer quadrant of right breast in female, estrogen receptor positive ([place]) 03/06/2012   Goiter 07/26/2009   Essential hypertension, benign  01/19/2007   Allergic rhinitis 01/19/2007    Past Surgical History:  Procedure Laterality Date   BREAST LUMPECTOMY Right 2014   BREAST LUMPECTOMY WITH SENTINEL LYMPH NODE BIOPSY  04/09/2012   Procedure: BREAST LUMPECTOMY WITH SENTINEL LYMPH NODE BX;  Surgeon: Merrie Roof, MD;  Location: [facility];  Service: General;  Laterality: Right;   BREAST SURGERY  1985   rt br bx   COLONOSCOPY  03/2014   poylps   COLONOSCOPY WITH PROPOFOL N/A 12/06/2021   Procedure: COLONOSCOPY WITH PROPOFOL;  Surgeon: Gatha Mayer, MD;  Location: [facility];  Service: Gastroenterology;  Laterality: N/A;   CYSTOSCOPY N/A 01/22/2017   Procedure: CYSTOSCOPY;  Surgeon: Bobbye Charleston, MD;  Location: [facility];  Service: Gynecology;  Laterality: N/A;   POLYPECTOMY  12/06/2021   Procedure: POLYPECTOMY;  Surgeon: Gatha Mayer, MD;  Location: [facility];  Service: Gastroenterology;;   ROBOTIC ASSISTED TOTAL HYSTERECTOMY WITH BILATERAL SALPINGO OOPHERECTOMY Bilateral 01/22/2017   Procedure: ROBOTIC ASSISTED TOTAL HYSTERECTOMY WITH BILATERAL SALPINGECTOMY AND LEFT OOPHORECTOMY;  Surgeon: Bobbye Charleston, MD;  Location: [facility];  Service: Gynecology;  Laterality: Bilateral;    OB History     Gravida  1   Para  1   Term  1   Preterm      AB      Living  1      SAB  IAB      Ectopic      Multiple      Live Births               Home Medications    Prior to Admission medications   Medication Sig Start Date End Date Taking? Authorizing Provider  moxifloxacin (VIGAMOX) 0.5 % ophthalmic solution Place 1 drop into the right eye 3 (three) times daily. 05/11/22  Yes Macie Baum, Wells Guiles, PA-C  levocetirizine (XYZAL) 5 MG tablet TAKE 1 TABLET BY MOUTH ONCE DAILY IN THE EVENING 03/29/22   [name], MD  olmesartan (BENICAR) 20 MG tablet Take 1 tablet (20 mg total) by mouth daily. 03/27/22   [name], MD  rosuvastatin (CRESTOR) 5 MG tablet Take 1 tablet (5 mg total) by  mouth daily. 03/27/22   [name], MD  spironolactone (ALDACTONE) 25 MG tablet Take 1 tablet (25 mg total) by mouth daily. 03/27/22   [name], MD  triamcinolone cream (KENALOG) 0.5 % Apply 1 Application topically 3 (three) times daily. 03/27/22   [name], MD    Family History Family History  Problem Relation Age of Onset   Lung cancer Father 11       smoker   Cancer Father        lung   Hypertension Other    Alcohol abuse Neg Hx    Diabetes Neg Hx    Early death Neg Hx    Hearing loss Neg Hx    Heart disease Neg Hx    Hyperlipidemia Neg Hx    Stroke Neg Hx    Kidney disease Neg Hx    Colon cancer Neg Hx    Breast cancer Neg Hx    Colon polyps Neg Hx    Esophageal cancer Neg Hx    Rectal cancer Neg Hx    Stomach cancer Neg Hx     Social History Social History   Tobacco Use   Smoking status: Never   Smokeless tobacco: Never  Vaping Use   Vaping Use: Never used  Substance Use Topics   Alcohol use: No   Drug use: No     Allergies   Aspirin and Codeine   Review of Systems Review of Systems As per HPI  Physical Exam Triage Vital Signs ED Triage Vitals  Enc Vitals Group     BP 05/11/22 1731 (!) 156/87     Pulse Rate 05/11/22 1731 80     Resp 05/11/22 1731 18     Temp 05/11/22 1731 97.7 F (36.5 C)     Temp Source 05/11/22 1731 Oral     SpO2 05/11/22 1731 98 %     Weight --      Height --      Head Circumference --      Peak Flow --      Pain Score 05/11/22 1729 0     Pain Loc --      Pain Edu? --      Excl. in [place]? --    No data found.  Updated Vital Signs BP (!) 156/87 (BP Location: Left Arm)   Pulse 80   Temp 97.7 F (36.5 C) (Oral)   Resp 18   LMP 01/07/2016 (Approximate)   SpO2 98%    Physical Exam Vitals and nursing note reviewed.  Constitutional:      General: She is not in acute distress. HENT:     Nose: No congestion or rhinorrhea.  Mouth/Throat:     Mouth: Mucous membranes are moist.     Pharynx:  Oropharynx is clear. No posterior oropharyngeal erythema.  Eyes:     General: Vision grossly intact.        Right eye: Discharge present.     Extraocular Movements: Extraocular movements intact.     Conjunctiva/sclera:     Right eye: Right conjunctiva is injected.     Comments: Green/yellow discharge from R eye  Cardiovascular:     Rate and Rhythm: Normal rate.  Pulmonary:     Effort: Pulmonary effort is normal.  Neurological:     Mental Status: She is alert and oriented to person, place, and time.      UC Treatments / Results  Labs (all labs ordered are listed, but only abnormal results are displayed) Labs Reviewed - No data to display  EKG   Radiology No results found.  Procedures Procedures (including critical care time)  Medications Ordered in UC Medications - No data to display  Initial Impression / Assessment and Plan / UC Course  I have reviewed the triage vital signs and the nursing notes.  Pertinent labs & imaging results that were available during my care of the patient were reviewed by me and considered in my medical decision making (see chart for details).  Bacterial conjunc of R eye, viagmox TID Recommend allergy med daily for runny nose. Symptomatic care otherwise. Return precautions discussed. Patient agrees to plan  Final Clinical Impressions(s) / UC Diagnoses   Final diagnoses:  Acute bacterial conjunctivitis of right eye  Runny nose     Discharge Instructions      Eye drops 3x daily for the next 5-7 days  Once daily zyrtec or allegra can help runny nose  You can use honey, cough drops, and lots of fluids to help with throat.    ED Prescriptions     Medication Sig Dispense Auth. Provider   moxifloxacin (VIGAMOX) 0.5 % ophthalmic solution Place 1 drop into the right eye 3 (three) times daily. 3 mL Annely Sliva, Wells Guiles, PA-C      PDMP not reviewed this encounter.   Nishanth Mccaughan, Vernice Jefferson 05/11/22 1824

## 2022-05-31 ENCOUNTER — Inpatient Hospital Stay: Payer: BC Managed Care – PPO | Attending: Adult Health | Admitting: Adult Health

## 2022-05-31 ENCOUNTER — Telehealth: Payer: Self-pay | Admitting: *Deleted

## 2022-05-31 ENCOUNTER — Other Ambulatory Visit: Payer: Self-pay

## 2022-05-31 VITALS — BP 135/94 | HR 76 | Temp 97.5°F | Resp 18 | Ht 66.0 in | Wt 156.6 lb

## 2022-05-31 DIAGNOSIS — Z90722 Acquired absence of ovaries, bilateral: Secondary | ICD-10-CM | POA: Insufficient documentation

## 2022-05-31 DIAGNOSIS — Z170 Estrogen receptor positive status [ER+]: Secondary | ICD-10-CM

## 2022-05-31 DIAGNOSIS — E119 Type 2 diabetes mellitus without complications: Secondary | ICD-10-CM | POA: Diagnosis not present

## 2022-05-31 DIAGNOSIS — Z923 Personal history of irradiation: Secondary | ICD-10-CM | POA: Insufficient documentation

## 2022-05-31 DIAGNOSIS — Z801 Family history of malignant neoplasm of trachea, bronchus and lung: Secondary | ICD-10-CM | POA: Diagnosis not present

## 2022-05-31 DIAGNOSIS — Z853 Personal history of malignant neoplasm of breast: Secondary | ICD-10-CM | POA: Insufficient documentation

## 2022-05-31 DIAGNOSIS — I10 Essential (primary) hypertension: Secondary | ICD-10-CM | POA: Insufficient documentation

## 2022-05-31 DIAGNOSIS — C50411 Malignant neoplasm of upper-outer quadrant of right female breast: Secondary | ICD-10-CM | POA: Diagnosis not present

## 2022-05-31 DIAGNOSIS — N649 Disorder of breast, unspecified: Secondary | ICD-10-CM | POA: Diagnosis not present

## 2022-05-31 DIAGNOSIS — N6459 Other signs and symptoms in breast: Secondary | ICD-10-CM | POA: Insufficient documentation

## 2022-05-31 DIAGNOSIS — Z90710 Acquired absence of both cervix and uterus: Secondary | ICD-10-CM | POA: Insufficient documentation

## 2022-05-31 NOTE — Telephone Encounter (Signed)
Ms Purdom to be seen at 1245 by Wilber Bihari, NP

## 2022-05-31 NOTE — Assessment & Plan Note (Signed)
Brooke Wood is a 57 year old woman with history of stage IIa ER/PR positive breast cancer diagnosed in 2013.  She has completed her treatment for breast cancer and continues on observation alone.  She is here today for urgent evaluation of her right nipple changes.  Her bilateral breast mammogram is due.  Therefore instead of it being a screening mammogram due to these nipple changes I have ordered bilateral diagnostic 3D mammogram with ultrasound to further evaluate.  I also sent Dr. Marlou Starks a message requesting that he see her in the next couple of weeks after she completes the her mammogram to look at her nipple.  We will follow-up with [name] based on her mammogram and ultrasound results along with Dr. Ethlyn Gallery visit with her.  She already has her long-term survivorship follow-up scheduled in December 2024.

## 2022-05-31 NOTE — Progress Notes (Signed)
[facility] Follow up:    Brooke Lima, MD [place] [address]   DIAGNOSIS:  Cancer Staging  Malignant neoplasm of upper-outer quadrant of right breast in female, estrogen receptor positive ([place]) Staging form: Breast, AJCC 7th Edition - Clinical: Stage IIA (T2, N0, cM0) - Unsigned Specimen type: Core Needle Biopsy Laterality: Right Staging comments: Staged at breast conference 12.11.13  - Pathologic: No stage assigned - Unsigned Specimen type: Core Needle Biopsy Laterality: Right   SUMMARY OF ONCOLOGIC HISTORY: (1) status post right breast biopsy on 03/11/2012 for an invasive ductal carcinoma with tubular features, grade 2, ER positive, PR positive, HER-2/neu negative, Ki-67 20%   (2) status post definitive right lumpectomy and sentinel lymph node biopsy on 04/09/2012. 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 One sentinel node had isolated tumor cells. All margins clean.              (a) in the 2018 calcification her tumor would be stage IB   (3) Oncotype DX performed. Her score was 10, giving her a 7% risk of outside the breast recurrence if her only systemic therapy is tamoxifen for 5 years   (4) She completed radiation therapy in April 2015   (5) She began tamoxifen in May 2014 completing 5 years April 2019   (6) status post hysterectomy with left salpingo-oophorectomy January 22, 2017, with benign pathology  CURRENT THERAPY: observation  INTERVAL HISTORY: Brooke Wood 57 y.o. female returns for follow-up and evaluation of right nipple concerns.  She notes that her right nipple has started peeling is itching and has had some drainage from it.  She also notes some cracking and dryness around the nipple.  She notes that this has been going on for 2 days and she is very concerned about this.  There are no other skin changes to either of her  breast or her left nipple.  Her lat mammogram occurred on 05/28/2021 and demonstrated no mammographic evidence of malignancy and breast density C.    Patient Active Problem List   Diagnosis Date Noted   Encounter for general adult medical examination with abnormal findings 03/26/2022   Onychomycosis of toenail 03/16/2020   Bilateral hearing loss 08/16/2019   Genetic testing 05/03/2019   Carcinoma of breast ([place]) 02/19/2019   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   Trochanteric bursitis of left hip 08/11/2018   Type II diabetes mellitus with manifestations ([place]) 07/02/2018   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   Abnormal electrocardiogram (ECG) (EKG) 10/08/2017   Hx of adenomatous colonic polyps 03/03/2014   Routine general medical examination at a health care facility 06/03/2013   Hyperlipidemia with target LDL less than 130 11/24/2012   Malignant neoplasm of upper-outer quadrant of right breast in female, estrogen receptor positive ([place]) 03/06/2012   Goiter 07/26/2009   Essential hypertension, benign 01/19/2007   Allergic rhinitis 01/19/2007    is allergic to aspirin and codeine.  MEDICAL HISTORY: Past Medical History:  Diagnosis Date   Allergy    Neg RAST 2007   Breast cancer [facility]) 2014   right breast   Diabetes mellitus without complication ([place])    Difficult airway for intubation 02/19/2019   Hx of adenomatous colonic polyps 03/03/2014   repeat 2022   Hx of radiation therapy 06/15/12 - 07/29/12   right rbeast, 50.4 Gy x 28 fx, boost to cumulative dose 60.4 gray  Hypertension    Personal history of radiation therapy 2014   PONV (postoperative nausea and vomiting)    SVD (spontaneous vaginal delivery)    x 1   Vocal cord dysfunction    Neg Methacholine challenge test 2007    SURGICAL HISTORY: Past Surgical History:  Procedure Laterality Date   BREAST LUMPECTOMY Right 2014   BREAST LUMPECTOMY WITH SENTINEL LYMPH NODE BIOPSY  04/09/2012   Procedure: BREAST LUMPECTOMY WITH  SENTINEL LYMPH NODE BX;  Surgeon: Merrie Roof, MD;  Location: [place];  Service: General;  Laterality: Right;   BREAST SURGERY  1985   rt br bx   COLONOSCOPY  03/2014   poylps   COLONOSCOPY WITH PROPOFOL N/A 12/06/2021   Procedure: COLONOSCOPY WITH PROPOFOL;  Surgeon: Gatha Mayer, MD;  Location: [facility];  Service: Gastroenterology;  Laterality: N/A;   CYSTOSCOPY N/A 01/22/2017   Procedure: CYSTOSCOPY;  Surgeon: Bobbye Charleston, MD;  Location: [facility];  Service: Gynecology;  Laterality: N/A;   POLYPECTOMY  12/06/2021   Procedure: POLYPECTOMY;  Surgeon: Gatha Mayer, MD;  Location: [facility];  Service: Gastroenterology;;   ROBOTIC ASSISTED TOTAL HYSTERECTOMY WITH BILATERAL SALPINGO OOPHERECTOMY Bilateral 01/22/2017   Procedure: ROBOTIC ASSISTED TOTAL HYSTERECTOMY WITH BILATERAL SALPINGECTOMY AND LEFT OOPHORECTOMY;  Surgeon: Bobbye Charleston, MD;  Location: [facility];  Service: Gynecology;  Laterality: Bilateral;    SOCIAL HISTORY: Social History   Socioeconomic History   Marital status: Single    Spouse name: Not on file   Number of children: Not on file   Years of education: Not on file   Highest education level: Not on file  Occupational History    Employer: UNEMPLOYED    Comment: [employer]  Tobacco Use   Smoking status: Never   Smokeless tobacco: Never  Vaping Use   Vaping Use: Never used  Substance and Sexual Activity   Alcohol use: No   Drug use: No   Sexual activity: Not Currently    Birth control/protection: None  Other Topics Concern   Not on file  Social History Narrative   Regular exercise-No   Social Determinants of Health   Financial Resource Strain: Not on file  Food Insecurity: Not on file  Transportation Needs: Not on file  Physical Activity: Not on file  Stress: Not on file  Social Connections: Not on file  Intimate Partner Violence: Not on file    FAMILY HISTORY: Family History  Problem Relation Age of Onset    Lung cancer Father 48       smoker   Cancer Father        lung   Hypertension Other    Alcohol abuse Neg Hx    Diabetes Neg Hx    Early death Neg Hx    Hearing loss Neg Hx    Heart disease Neg Hx    Hyperlipidemia Neg Hx    Stroke Neg Hx    Kidney disease Neg Hx    Colon cancer Neg Hx    Breast cancer Neg Hx    Colon polyps Neg Hx    Esophageal cancer Neg Hx    Rectal cancer Neg Hx    Stomach cancer Neg Hx     Review of Systems  Constitutional:  Negative for appetite change, chills, fatigue, fever and unexpected weight change.  HENT:   Negative for hearing loss, lump/mass and trouble swallowing.   Eyes:  Negative for eye problems and icterus.  Respiratory:  Negative for chest  tightness, cough and shortness of breath.   Cardiovascular:  Negative for chest pain, leg swelling and palpitations.  Gastrointestinal:  Negative for abdominal distention, abdominal pain, constipation, diarrhea, nausea and vomiting.  Endocrine: Negative for hot flashes.  Genitourinary:  Negative for difficulty urinating.   Musculoskeletal:  Negative for arthralgias.  Skin:  Negative for itching and rash.  Neurological:  Negative for dizziness, extremity weakness, headaches and numbness.  Hematological:  Negative for adenopathy. Does not bruise/bleed easily.  Psychiatric/Behavioral:  Negative for depression. The patient is not nervous/anxious.       PHYSICAL EXAMINATION  ECOG PERFORMANCE STATUS: 1 - Symptomatic but completely ambulatory  Vitals:   05/31/22 1240  BP: (!) 135/94  Pulse: 76  Resp: 18  Temp: (!) 97.5 F (36.4 C)  SpO2: 100%    Physical Exam Constitutional:      General: She is not in acute distress.    Appearance: Normal appearance. She is not toxic-appearing.  HENT:     Head: Normocephalic and atraumatic.  Eyes:     General: No scleral icterus. Cardiovascular:     Rate and Rhythm: Normal rate and regular rhythm.     Pulses: Normal pulses.     Heart sounds: Normal heart  sounds.  Pulmonary:     Effort: Pulmonary effort is normal.     Breath sounds: Normal breath sounds.  Chest:     Comments: Right nipple with some cracking at the lower border where the nipple meets the areola.  There is some dry scaly skin around that.  I am not able to discern any mass or nodule in the right breast.  Left breast is benign. Abdominal:     General: Abdomen is flat. Bowel sounds are normal. There is no distension.     Palpations: Abdomen is soft.     Tenderness: There is no abdominal tenderness.  Musculoskeletal:        General: No swelling.     Cervical back: Neck supple.  Lymphadenopathy:     Cervical: No cervical adenopathy.  Skin:    General: Skin is warm and dry.     Findings: No rash.  Neurological:     General: No focal deficit present.     Mental Status: She is alert.  Psychiatric:        Mood and Affect: Mood normal.        Behavior: Behavior normal.     LABORATORY DATA:  None for this visit   ASSESSMENT and THERAPY PLAN:   Malignant neoplasm of upper-outer quadrant of right breast in female, estrogen receptor positive ([place]) Milus Banister is a 57 year old woman with history of stage IIa ER/PR positive breast cancer diagnosed in 2013.  She has completed her treatment for breast cancer and continues on observation alone.  She is here today for urgent evaluation of her right nipple changes.  Her bilateral breast mammogram is due.  Therefore instead of it being a screening mammogram due to these nipple changes I have ordered bilateral diagnostic 3D mammogram with ultrasound to further evaluate.  I also sent Dr. Marlou Starks a message requesting that he see her in the next couple of weeks after she completes the her mammogram to look at her nipple.  We will follow-up with [place] based on her mammogram and ultrasound results along with Dr. Ethlyn Gallery visit with her.  She already has her long-term survivorship follow-up scheduled in December 2024.    All questions were  answered. The patient knows to call the clinic with  any problems, questions or concerns. We can certainly see the patient much sooner if necessary.  Total encounter time:20 minutes*in face-to-face visit time, chart review, lab review, care coordination, order entry, and documentation of the encounter time.  Wilber Bihari, NP 05/31/22 3:15 PM Medical Oncology and Hematology [facility] [place], [place] [address] Tel. [phone]    Fax. [phone]  *Total Encounter Time as defined by the Centers for Medicare and Medicaid Services includes, in addition to the face-to-face time of a patient visit (documented in the note above) non-face-to-face time: obtaining and reviewing outside history, ordering and reviewing medications, tests or procedures, care coordination (communications with other health care professionals or caregivers) and documentation in the medical record.

## 2022-05-31 NOTE — Telephone Encounter (Signed)
Dacie states her nipple on her right breast hurts and has some drainage. It started yesterday. "I need to come to the cancer center now, my nerves are shot!"

## 2022-06-03 ENCOUNTER — Telehealth: Payer: Self-pay

## 2022-06-03 NOTE — Telephone Encounter (Signed)
Called pt to advise she was offered appt at [facility] 06/04/22 at 1230 for MM and [place]. She accepted and knows to show at 1230 for 1250 appt.

## 2022-06-04 ENCOUNTER — Ambulatory Visit
Admission: RE | Admit: 2022-06-04 | Discharge: 2022-06-04 | Disposition: A | Discharge status: Home / Self Care | Payer: BC Managed Care – PPO | Source: Ambulatory Visit | Attending: Adult Health | Admitting: Adult Health

## 2022-06-04 ENCOUNTER — Ambulatory Visit: Payer: BC Managed Care – PPO

## 2022-06-04 DIAGNOSIS — R922 Inconclusive mammogram: Secondary | ICD-10-CM | POA: Diagnosis not present

## 2022-06-04 DIAGNOSIS — Z170 Estrogen receptor positive status [ER+]: Secondary | ICD-10-CM

## 2022-06-04 DIAGNOSIS — C50411 Malignant neoplasm of upper-outer quadrant of right female breast: Secondary | ICD-10-CM

## 2022-06-04 DIAGNOSIS — N649 Disorder of breast, unspecified: Secondary | ICD-10-CM

## 2022-06-14 ENCOUNTER — Telehealth: Payer: Self-pay | Admitting: Internal Medicine

## 2022-06-14 NOTE — Telephone Encounter (Signed)
Patient called and said she needs a doctor's note for her vertigo. She is worried about living in an upstairs apartment and would like to transfer, but her apartment manager needs a note to allow her.  Patient would like a call to pick up the letter at [phone].

## 2022-06-17 ENCOUNTER — Ambulatory Visit: Payer: BC Managed Care – PPO

## 2022-06-24 NOTE — Telephone Encounter (Signed)
Pt called back wanted a note to transfer to a one bedroom. Pt scared she may fall down/up stairs.  Can someone will give pt a call with update.

## 2022-06-27 ENCOUNTER — Telehealth: Payer: Self-pay

## 2022-06-27 NOTE — Telephone Encounter (Signed)
Returned Pt's call and LVM regarding breast changes. Pt stated she is having breast changes and wants to be seen in clinic. Gave call back number to discuss further.

## 2022-07-04 ENCOUNTER — Telehealth: Payer: Self-pay

## 2022-07-04 NOTE — Progress Notes (Signed)
   07/04/2022  Patient ID: Brooke Wood, female   DOB: Dec 06, 1965, 57 y.o.   MRN: [id number]  Patient appearing on report for [employer] - Hypertension Control report due to last documented ambulatory blood pressure of 135/94 on 05/31/22. Next appointment with PCP is 07/08/22   Outreached patient to discuss hypertension control and medication management. Left voicemail for patient to return my call at their convenience.   Darlina Guys, PharmD, DPLA

## 2022-07-08 ENCOUNTER — Ambulatory Visit (INDEPENDENT_AMBULATORY_CARE_PROVIDER_SITE_OTHER): Payer: BC Managed Care – PPO | Admitting: Internal Medicine

## 2022-07-08 ENCOUNTER — Encounter: Payer: Self-pay | Admitting: Internal Medicine

## 2022-07-08 VITALS — BP 138/86 | HR 90 | Temp 97.9°F | Resp 16 | Ht 66.0 in | Wt 159.0 lb

## 2022-07-08 DIAGNOSIS — E559 Vitamin D deficiency, unspecified: Secondary | ICD-10-CM

## 2022-07-08 DIAGNOSIS — I10 Essential (primary) hypertension: Secondary | ICD-10-CM

## 2022-07-08 DIAGNOSIS — B351 Tinea unguium: Secondary | ICD-10-CM

## 2022-07-08 DIAGNOSIS — E118 Type 2 diabetes mellitus with unspecified complications: Secondary | ICD-10-CM

## 2022-07-08 DIAGNOSIS — B372 Candidiasis of skin and nail: Secondary | ICD-10-CM | POA: Diagnosis not present

## 2022-07-08 MED ORDER — CHOLECALCIFEROL 50 MCG (2000 UT) PO TABS: 1.0000 | ORAL_TABLET | Freq: Every day | ORAL | 3 refills | Status: DC

## 2022-07-08 MED ORDER — FLUCONAZOLE 150 MG PO TABS: 150.0000 mg | ORAL_TABLET | ORAL | 0 refills | Status: DC

## 2022-07-08 NOTE — Patient Instructions (Signed)
Hypertension, Adult High blood pressure (hypertension) is when the force of blood pumping through the arteries is too strong. The arteries are the blood vessels that carry blood from the heart throughout the body. Hypertension forces the heart to work harder to pump blood and may cause arteries to become narrow or stiff. Untreated or uncontrolled hypertension can lead to a heart attack, heart failure, a stroke, kidney disease, and other problems. A blood pressure reading consists of a higher number over a lower number. Ideally, your blood pressure should be below 120/80. The first ("top") number is called the systolic pressure. It is a measure of the pressure in your arteries as your heart beats. The second ("bottom") number is called the diastolic pressure. It is a measure of the pressure in your arteries as the heart relaxes. What are the causes? The exact cause of this condition is not known. There are some conditions that result in high blood pressure. What increases the risk? Certain factors may make you more likely to develop high blood pressure. Some of these risk factors are under your control, including: Smoking. Not getting enough exercise or physical activity. Being overweight. Having too much fat, sugar, calories, or salt (sodium) in your diet. Drinking too much alcohol. Other risk factors include: Having a personal history of heart disease, diabetes, high cholesterol, or kidney disease. Stress. Having a family history of high blood pressure and high cholesterol. Having obstructive sleep apnea. Age. The risk increases with age. What are the signs or symptoms? High blood pressure may not cause symptoms. Very high blood pressure (hypertensive crisis) may cause: Headache. Fast or irregular heartbeats (palpitations). Shortness of breath. Nosebleed. Nausea and vomiting. Vision changes. Severe chest pain, dizziness, and seizures. How is this diagnosed? This condition is diagnosed by  measuring your blood pressure while you are seated, with your arm resting on a flat surface, your legs uncrossed, and your feet flat on the floor. The cuff of the blood pressure monitor will be placed directly against the skin of your upper arm at the level of your heart. Blood pressure should be measured at least twice using the same arm. Certain conditions can cause a difference in blood pressure between your right and left arms. If you have a high blood pressure reading during one visit or you have normal blood pressure with other risk factors, you may be asked to: Return on a different day to have your blood pressure checked again. Monitor your blood pressure at home for 1 week or longer. If you are diagnosed with hypertension, you may have other blood or imaging tests to help your health care provider understand your overall risk for other conditions. How is this treated? This condition is treated by making healthy lifestyle changes, such as eating healthy foods, exercising more, and reducing your alcohol intake. You may be referred for counseling on a healthy diet and physical activity. Your health care provider may prescribe medicine if lifestyle changes are not enough to get your blood pressure under control and if: Your systolic blood pressure is above 130. Your diastolic blood pressure is above 80. Your personal target blood pressure may vary depending on your medical conditions, your age, and other factors. Follow these instructions at home: Eating and drinking  Eat a diet that is high in fiber and potassium, and low in sodium, added sugar, and fat. An example of this eating plan is called the DASH diet. DASH stands for Dietary Approaches to Stop Hypertension. To eat this way: Eat   plenty of fresh fruits and vegetables. Try to fill one half of your plate at each meal with fruits and vegetables. Eat whole grains, such as whole-wheat pasta, brown rice, or whole-grain bread. Fill about one  fourth of your plate with whole grains. Eat or drink low-fat dairy products, such as skim milk or low-fat yogurt. Avoid fatty cuts of meat, processed or cured meats, and poultry with skin. Fill about one fourth of your plate with lean proteins, such as fish, chicken without skin, beans, eggs, or tofu. Avoid pre-made and processed foods. These tend to be higher in sodium, added sugar, and fat. Reduce your daily sodium intake. Many people with hypertension should eat less than 1,500 mg of sodium a day. Do not drink alcohol if: Your health care provider tells you not to drink. You are pregnant, may be pregnant, or are planning to become pregnant. If you drink alcohol: Limit how much you have to: 0-1 drink a day for women. 0-2 drinks a day for men. Know how much alcohol is in your drink. In the [place] one drink equals one 12 oz bottle of beer (355 mL), one 5 oz glass of wine (148 mL), or one 1 oz glass of hard liquor (44 mL). Lifestyle  Work with your health care provider to maintain a healthy body weight or to lose weight. Ask what an ideal weight is for you. Get at least 30 minutes of exercise that causes your heart to beat faster (aerobic exercise) most days of the week. Activities may include walking, swimming, or biking. Include exercise to strengthen your muscles (resistance exercise), such as Pilates or lifting weights, as part of your weekly exercise routine. Try to do these types of exercises for 30 minutes at least 3 days a week. Do not use any products that contain nicotine or tobacco. These products include cigarettes, chewing tobacco, and vaping devices, such as e-cigarettes. If you need help quitting, ask your health care provider. Monitor your blood pressure at home as told by your health care provider. Keep all follow-up visits. This is important. Medicines Take over-the-counter and prescription medicines only as told by your health care provider. Follow directions carefully. Blood  pressure medicines must be taken as prescribed. Do not skip doses of blood pressure medicine. Doing this puts you at risk for problems and can make the medicine less effective. Ask your health care provider about side effects or reactions to medicines that you should watch for. Contact a health care provider if you: Think you are having a reaction to a medicine you are taking. Have headaches that keep coming back (recurring). Feel dizzy. Have swelling in your ankles. Have trouble with your vision. Get help right away if you: Develop a severe headache or confusion. Have unusual weakness or numbness. Feel faint. Have severe pain in your chest or abdomen. Vomit repeatedly. Have trouble breathing. These symptoms may be an emergency. Get help right away. Call 911. Do not wait to see if the symptoms will go away. Do not drive yourself to the hospital. Summary Hypertension is when the force of blood pumping through your arteries is too strong. If this condition is not controlled, it may put you at risk for serious complications. Your personal target blood pressure may vary depending on your medical conditions, your age, and other factors. For most people, a normal blood pressure is less than 120/80. Hypertension is treated with lifestyle changes, medicines, or a combination of both. Lifestyle changes include losing weight, eating a healthy,   low-sodium diet, exercising more, and limiting alcohol. This information is not intended to replace advice given to you by your health care provider. Make sure you discuss any questions you have with your health care provider. Document Revised: 01/23/2021 Document Reviewed: 01/23/2021 Elsevier Patient Education  2023 Elsevier Inc.  

## 2022-07-08 NOTE — Progress Notes (Unsigned)
Subjective:  Patient ID: Brooke Wood, female    DOB: 1965/10/26  Age: 57 y.o. MRN: [id number]  CC: Hypertension and Diabetes   HPI Brooke Wood presents for f/up -----  She complains of abnormal finger and toenails.  Outpatient Medications Prior to Visit  Medication Sig Dispense Refill   levocetirizine (XYZAL) 5 MG tablet TAKE 1 TABLET BY MOUTH ONCE DAILY IN THE EVENING 90 tablet 0   moxifloxacin (VIGAMOX) 0.5 % ophthalmic solution Place 1 drop into the right eye 3 (three) times daily. 3 mL 0   olmesartan (BENICAR) 20 MG tablet Take 1 tablet (20 mg total) by mouth daily. 90 tablet 1   rosuvastatin (CRESTOR) 5 MG tablet Take 1 tablet (5 mg total) by mouth daily. 90 tablet 1   spironolactone (ALDACTONE) 25 MG tablet Take 1 tablet (25 mg total) by mouth daily. 90 tablet 1   triamcinolone cream (KENALOG) 0.5 % Apply 1 Application topically 3 (three) times daily. 90 g 2   No facility-administered medications prior to visit.    ROS Review of Systems  Objective:  BP 138/86 (BP Location: Right Arm, Patient Position: Sitting, Cuff Size: Large)   Pulse 90   Temp 97.9 F (36.6 C) (Oral)   Resp 16   Ht 5\' 6"  (1.676 m)   Wt 159 lb (72.1 kg)   LMP 01/07/2016 (Approximate)   SpO2 97%   BMI 25.66 kg/m   BP Readings from Last 3 Encounters:  07/08/22 138/86  05/31/22 (!) 135/94  05/11/22 (!) 156/87    Wt Readings from Last 3 Encounters:  07/08/22 159 lb (72.1 kg)  05/31/22 156 lb 9.6 oz (71 kg)  05/02/22 155 lb (70.3 kg)    Physical Exam  Lab Results  Component Value Date   WBC 6.6 03/26/2022   HGB 13.5 03/26/2022   HCT 41.6 03/26/2022   PLT 211.0 03/26/2022   GLUCOSE 90 12/31/2021   CHOL 168 03/26/2022   TRIG 49.0 03/26/2022   HDL 74.60 03/26/2022   LDLCALC 84 03/26/2022   ALT 17 06/12/2021   AST 15 06/12/2021   NA 138 12/31/2021   K 3.8 12/31/2021   CL 101 12/31/2021   CREATININE 0.68 12/31/2021   BUN 15 12/31/2021   CO2 30 12/31/2021   TSH 1.33 03/26/2022    HGBA1C 6.1 03/26/2022   MICROALBUR <0.7 09/24/2021    MM DIAG BREAST TOMO BILATERAL  Result Date: 06/04/2022 CLINICAL DATA:  Dry itchy nipple.  No rash. EXAM: DIGITAL DIAGNOSTIC BILATERAL MAMMOGRAM WITH TOMOSYNTHESIS TECHNIQUE: Bilateral digital diagnostic mammography and breast tomosynthesis was performed. COMPARISON:  Previous exam(s). ACR Breast Density Category c: The breasts are heterogeneously dense, which may obscure small masses. FINDINGS: No suspicious masses, calcifications, distortion, or other abnormality in either breast. The right lumpectomy site is stable. IMPRESSION: No mammographic evidence of malignancy. No cause for the patient's symptoms identified. RECOMMENDATION: Treatment of the patient's symptoms should be based on clinical and physical exam given lack of imaging findings. Recommend annual screening mammography. I have discussed the findings and recommendations with the patient. If applicable, a reminder letter will be sent to the patient regarding the next appointment. BI-RADS CATEGORY  2: Benign. Electronically Signed   By: Gerome Samavid  Williams III M.D.   On: 06/04/2022 13:20   Assessment & Plan:  Essential hypertension, benign  Type II diabetes mellitus with manifestations  Vitamin D deficiency -     Cholecalciferol; Take 1 tablet (2,000 Units total) by mouth daily.  Dispense: 90  tablet; Refill: 3     Follow-up: Return in about 4 months (around 11/07/2022).  [name], MD

## 2022-07-15 DIAGNOSIS — Z6825 Body mass index (BMI) 25.0-25.9, adult: Secondary | ICD-10-CM | POA: Diagnosis not present

## 2022-07-15 DIAGNOSIS — Z01419 Encounter for gynecological examination (general) (routine) without abnormal findings: Secondary | ICD-10-CM | POA: Diagnosis not present

## 2022-08-05 DIAGNOSIS — B372 Candidiasis of skin and nail: Secondary | ICD-10-CM | POA: Diagnosis not present

## 2022-08-05 DIAGNOSIS — Z6825 Body mass index (BMI) 25.0-25.9, adult: Secondary | ICD-10-CM | POA: Diagnosis not present

## 2022-08-28 ENCOUNTER — Ambulatory Visit: Payer: BC Managed Care – PPO | Admitting: Internal Medicine

## 2022-09-24 ENCOUNTER — Other Ambulatory Visit: Payer: Self-pay | Admitting: Internal Medicine

## 2022-09-24 DIAGNOSIS — J301 Allergic rhinitis due to pollen: Secondary | ICD-10-CM

## 2022-09-24 DIAGNOSIS — E118 Type 2 diabetes mellitus with unspecified complications: Secondary | ICD-10-CM

## 2022-09-24 DIAGNOSIS — I10 Essential (primary) hypertension: Secondary | ICD-10-CM

## 2022-09-24 DIAGNOSIS — E785 Hyperlipidemia, unspecified: Secondary | ICD-10-CM

## 2022-09-24 DIAGNOSIS — E876 Hypokalemia: Secondary | ICD-10-CM

## 2022-09-30 ENCOUNTER — Encounter: Payer: Self-pay | Admitting: Internal Medicine

## 2022-09-30 ENCOUNTER — Ambulatory Visit (INDEPENDENT_AMBULATORY_CARE_PROVIDER_SITE_OTHER): Payer: BC Managed Care – PPO | Admitting: Internal Medicine

## 2022-09-30 VITALS — BP 138/66 | HR 88 | Temp 97.9°F | Ht 66.0 in | Wt 158.0 lb

## 2022-09-30 DIAGNOSIS — E785 Hyperlipidemia, unspecified: Secondary | ICD-10-CM

## 2022-09-30 DIAGNOSIS — R0609 Other forms of dyspnea: Secondary | ICD-10-CM | POA: Diagnosis not present

## 2022-09-30 DIAGNOSIS — F40243 Fear of flying: Secondary | ICD-10-CM

## 2022-09-30 DIAGNOSIS — I10 Essential (primary) hypertension: Secondary | ICD-10-CM | POA: Diagnosis not present

## 2022-09-30 DIAGNOSIS — T753XXA Motion sickness, initial encounter: Secondary | ICD-10-CM

## 2022-09-30 DIAGNOSIS — E118 Type 2 diabetes mellitus with unspecified complications: Secondary | ICD-10-CM | POA: Diagnosis not present

## 2022-09-30 LAB — URINALYSIS, ROUTINE W REFLEX MICROSCOPIC
Bilirubin Urine: NEGATIVE
Hgb urine dipstick: NEGATIVE
Ketones, ur: NEGATIVE
Leukocytes,Ua: NEGATIVE
Nitrite: NEGATIVE
RBC / HPF: NONE SEEN (ref 0–?)
Specific Gravity, Urine: 1.025 (ref 1.000–1.030)
Total Protein, Urine: NEGATIVE
Urine Glucose: NEGATIVE
Urobilinogen, UA: 0.2 (ref 0.0–1.0)
WBC, UA: NONE SEEN (ref 0–?)
pH: 7 (ref 5.0–8.0)

## 2022-09-30 LAB — BASIC METABOLIC PANEL
BUN: 12 mg/dL (ref 6–23)
CO2: 30 mEq/L (ref 19–32)
Calcium: 10.1 mg/dL (ref 8.4–10.5)
Chloride: 102 mEq/L (ref 96–112)
Creatinine, Ser: 0.67 mg/dL (ref 0.40–1.20)
GFR: 95.72 mL/min (ref >60.00)
Glucose, Bld: 82 mg/dL (ref 70–99)
Potassium: 4.2 mEq/L (ref 3.5–5.1)
Sodium: 140 mEq/L (ref 135–145)

## 2022-09-30 LAB — CBC WITH DIFFERENTIAL/PLATELET
Basophils Absolute: 0 10*3/uL (ref 0.0–0.1)
Basophils Relative: 0.7 % (ref 0.0–3.0)
Eosinophils Absolute: 0 10*3/uL (ref 0.0–0.7)
Eosinophils Relative: 0.9 % (ref 0.0–5.0)
HCT: 43.2 % (ref 36.0–46.0)
Hemoglobin: 13.9 g/dL (ref 12.0–15.0)
Lymphocytes Relative: 42.9 % (ref 12.0–46.0)
Lymphs Abs: 2.2 10*3/uL (ref 0.7–4.0)
MCHC: 32.2 g/dL (ref 30.0–36.0)
MCV: 89.2 fl (ref 78.0–100.0)
Monocytes Absolute: 0.5 10*3/uL (ref 0.1–1.0)
Monocytes Relative: 10.4 % (ref 3.0–12.0)
Neutro Abs: 2.3 10*3/uL (ref 1.4–7.7)
Neutrophils Relative %: 45.1 % (ref 43.0–77.0)
Platelets: 217 10*3/uL (ref 150.0–400.0)
RBC: 4.84 Mil/uL (ref 3.87–5.11)
RDW: 13.9 % (ref 11.5–15.5)
WBC: 5.2 10*3/uL (ref 4.0–10.5)

## 2022-09-30 LAB — TROPONIN I (HIGH SENSITIVITY): High Sens Troponin I: 4 ng/L (ref 2–17)

## 2022-09-30 LAB — HEMOGLOBIN A1C: Hgb A1c MFr Bld: 6.4 % (ref 4.6–6.5)

## 2022-09-30 LAB — HEPATIC FUNCTION PANEL
ALT: 19 U/L (ref 0–35)
AST: 17 U/L (ref 0–37)
Albumin: 4.7 g/dL (ref 3.5–5.2)
Alkaline Phosphatase: 150 U/L — ABNORMAL HIGH (ref 39–117)
Bilirubin, Direct: 0.1 mg/dL (ref 0.0–0.3)
Total Bilirubin: 0.5 mg/dL (ref 0.2–1.2)
Total Protein: 7.8 g/dL (ref 6.0–8.3)

## 2022-09-30 LAB — MICROALBUMIN / CREATININE URINE RATIO
Creatinine,U: 164.1 mg/dL
Microalb Creat Ratio: 0.4 mg/g (ref 0.0–30.0)
Microalb, Ur: <0.7 mg/dL (ref 0.0–1.9)

## 2022-09-30 LAB — BRAIN NATRIURETIC PEPTIDE: Pro B Natriuretic peptide (BNP): 12 pg/mL (ref 0.0–100.0)

## 2022-09-30 MED ORDER — DIAZEPAM 5 MG PO TABS: 5.0000 mg | ORAL_TABLET | Freq: Once | ORAL | 1 refills | Status: AC

## 2022-09-30 MED ORDER — SCOPOLAMINE 1 MG/3DAYS TD PT72: 1.0000 | MEDICATED_PATCH | TRANSDERMAL | 0 refills | Status: DC

## 2022-09-30 NOTE — Patient Instructions (Signed)

## 2022-09-30 NOTE — Progress Notes (Signed)
Subjective:  Patient ID: Brooke Wood, female    DOB: Nov 10, 1965  Age: 57 y.o. MRN: [id number]  CC: Hypertension, Hyperlipidemia, and Diabetes   HPI [name] presents for f/up ---  Discussed the use of AI scribe software for clinical note transcription with the patient, who gave verbal consent to proceed.  History of Present Illness   The patient, with a known history of diabetes and vertigo, presents with concerns about potential seasickness and fear of flying due to an upcoming trip. They request medication to manage these anticipated issues. They deny any recent exacerbation of diabetes symptoms such as excessive thirst, urination, dizziness, nausea, vomiting, diarrhea, or constipation.  The patient also reports experiencing shortness of breath when climbing hills or walking too fast, a symptom they have noticed for some time. They deny any associated dizziness or lightheadedness.   Despite the heat, they deny any recent swelling in their legs or feet. They also mention a recent weight loss of two pounds. They have not had a recent eye exam and request a referral for one, as their usual provider is no longer available.       Outpatient Medications Prior to Visit  Medication Sig Dispense Refill   Cholecalciferol 50 MCG (2000 UT) TABS Take 1 tablet (2,000 Units total) by mouth daily. 90 tablet 3   fluconazole (DIFLUCAN) 150 MG tablet Take 1 tablet (150 mg total) by mouth once a week. 12 tablet 0   levocetirizine (XYZAL) 5 MG tablet TAKE 1 TABLET BY MOUTH ONCE DAILY IN THE EVENING 90 tablet 0   olmesartan (BENICAR) 20 MG tablet Take 1 tablet by mouth once daily 90 tablet 0   rosuvastatin (CRESTOR) 5 MG tablet Take 1 tablet by mouth once daily 90 tablet 0   spironolactone (ALDACTONE) 25 MG tablet Take 1 tablet by mouth once daily 90 tablet 0   triamcinolone cream (KENALOG) 0.5 % Apply 1 Application topically 3 (three) times daily. 90 g 2   moxifloxacin (VIGAMOX) 0.5 % ophthalmic  solution Place 1 drop into the right eye 3 (three) times daily. 3 mL 0   No facility-administered medications prior to visit.    ROS Review of Systems  Constitutional: Negative.  Negative for chills, diaphoresis, fatigue and fever.  HENT: Negative.    Eyes: Negative.   Respiratory:  Positive for shortness of breath. Negative for chest tightness and wheezing.   Cardiovascular:  Negative for chest pain, palpitations and leg swelling.  Gastrointestinal:  Negative for abdominal pain, constipation, diarrhea, nausea and vomiting.  Endocrine: Negative.   Genitourinary: Negative.   Musculoskeletal:  Positive for arthralgias. Negative for myalgias.  Skin: Negative.  Negative for color change and pallor.  Allergic/Immunologic: Negative for immunocompromised state.  Neurological: Negative.  Negative for dizziness and weakness.  Hematological:  Negative for adenopathy. Does not bruise/bleed easily.  Psychiatric/Behavioral: Negative.      Objective:  BP 138/66 (BP Location: Left Arm, Patient Position: Sitting, Cuff Size: Large)   Pulse 88   Temp 97.9 F (36.6 C) (Oral)   Ht 5\' 6"  (1.676 m)   Wt 158 lb (71.7 kg)   LMP 01/07/2016 (Approximate)   SpO2 96%   BMI 25.50 kg/m   BP Readings from Last 3 Encounters:  09/30/22 138/66  07/08/22 138/86  05/31/22 (!) 135/94    Wt Readings from Last 3 Encounters:  09/30/22 158 lb (71.7 kg)  07/08/22 159 lb (72.1 kg)  05/31/22 156 lb 9.6 oz (71 kg)  Physical Exam Vitals reviewed.  Constitutional:      Appearance: She is not ill-appearing.  HENT:     Nose: Nose normal.     Mouth/Throat:     Mouth: Mucous membranes are moist.  Eyes:     General: No scleral icterus.    Conjunctiva/sclera: Conjunctivae normal.  Cardiovascular:     Rate and Rhythm: Normal rate and regular rhythm.     Heart sounds: No murmur heard.    No gallop.  Pulmonary:     Effort: Pulmonary effort is normal.     Breath sounds: No stridor. No wheezing, rhonchi or  rales.  Abdominal:     General: Abdomen is flat.     Palpations: There is no mass.     Tenderness: There is no abdominal tenderness. There is no guarding.     Hernia: No hernia is present.  Musculoskeletal:        General: Normal range of motion.     Cervical back: Neck supple.     Right lower leg: No edema.     Left lower leg: No edema.  Lymphadenopathy:     Cervical: No cervical adenopathy.  Skin:    General: Skin is warm and dry.  Neurological:     Mental Status: Mental status is at baseline.  Psychiatric:        Mood and Affect: Mood normal.     Lab Results  Component Value Date   WBC 5.2 09/30/2022   HGB 13.9 09/30/2022   HCT 43.2 09/30/2022   PLT 217.0 09/30/2022   GLUCOSE 82 09/30/2022   CHOL 168 03/26/2022   TRIG 49.0 03/26/2022   HDL 74.60 03/26/2022   LDLCALC 84 03/26/2022   ALT 19 09/30/2022   AST 17 09/30/2022   NA 140 09/30/2022   K 4.2 09/30/2022   CL 102 09/30/2022   CREATININE 0.67 09/30/2022   BUN 12 09/30/2022   CO2 30 09/30/2022   TSH 1.33 03/26/2022   HGBA1C 6.4 09/30/2022   MICROALBUR <0.7 09/30/2022    MM DIAG BREAST TOMO BILATERAL  Result Date: 06/04/2022 CLINICAL DATA:  Dry itchy nipple.  No rash. EXAM: DIGITAL DIAGNOSTIC BILATERAL MAMMOGRAM WITH TOMOSYNTHESIS TECHNIQUE: Bilateral digital diagnostic mammography and breast tomosynthesis was performed. COMPARISON:  Previous exam(s). ACR Breast Density Category c: The breasts are heterogeneously dense, which may obscure small masses. FINDINGS: No suspicious masses, calcifications, distortion, or other abnormality in either breast. The right lumpectomy site is stable. IMPRESSION: No mammographic evidence of malignancy. No cause for the patient's symptoms identified. RECOMMENDATION: Treatment of the patient's symptoms should be based on clinical and physical exam given lack of imaging findings. Recommend annual screening mammography. I have discussed the findings and recommendations with the patient.  If applicable, a reminder letter will be sent to the patient regarding the next appointment. BI-RADS CATEGORY  2: Benign. Electronically Signed   By: Gerome Sam III M.D.   On: 06/04/2022 13:20   Assessment & Plan:   Type II diabetes mellitus with manifestations (HCC)-- Her blood sugar is well-controlled. -     Microalbumin / creatinine urine ratio; Future -     Hemoglobin A1c; Future -     Basic metabolic panel; Future -     Ambulatory referral to Ophthalmology  Hyperlipidemia with target LDL less than 130 - LDL goal achieved. Doing well on the statin  -     Lipoprotein A (LPA); Future -     Hepatic function panel; Future  Essential hypertension,  benign- Her blood pressure is well-controlled. -     Urinalysis, Routine w reflex microscopic; Future -     CBC with Differential/Platelet; Future  DOE (dyspnea on exertion- Will evaluate for CAD. -     Brain natriuretic peptide; Future -     Troponin I (High Sensitivity); Future -     CT CARDIAC SCORING ([facility] LOCATIONS ONLY); Future  Fear of flying -     diazePAM; Take 1 tablet (5 mg total) by mouth once for 1 dose.  Dispense: 5 tablet; Refill: 1  Motion sickness, initial encounter -     Scopolamine; Place 1 patch (1.5 mg total) onto the skin every 3 (three) days.  Dispense: 4 patch; Refill: 0     Follow-up: Return in about 6 months (around 04/02/2023).  [name], MD

## 2022-10-04 ENCOUNTER — Encounter: Payer: Self-pay | Admitting: Internal Medicine

## 2022-10-04 ENCOUNTER — Ambulatory Visit (INDEPENDENT_AMBULATORY_CARE_PROVIDER_SITE_OTHER): Payer: BC Managed Care – PPO | Admitting: Internal Medicine

## 2022-10-04 VITALS — BP 136/76 | HR 76 | Temp 98.1°F | Resp 16 | Wt 157.0 lb

## 2022-10-04 DIAGNOSIS — H6122 Impacted cerumen, left ear: Secondary | ICD-10-CM | POA: Diagnosis not present

## 2022-10-04 DIAGNOSIS — M7062 Trochanteric bursitis, left hip: Secondary | ICD-10-CM

## 2022-10-04 LAB — LIPOPROTEIN A (LPA): Lipoprotein (a): 27 nmol/L (ref <75)

## 2022-10-04 NOTE — Patient Instructions (Signed)

## 2022-10-04 NOTE — Progress Notes (Unsigned)
Subjective:  Patient ID: Brooke Wood, female    DOB: Dec 19, 1965  Age: 57 y.o. MRN: [id number]  CC: Hypertension, Hyperlipidemia, and Diabetes   HPI Brooke Wood presents for f/up --  Discussed the use of AI scribe software for clinical note transcription with the patient, who gave verbal consent to proceed.  History of Present Illness   The patient presents with chronic hip pain, which was particularly severe upon waking the previous day. The onset of the pain is unclear, as the patient does not recall a specific injury. Despite the recurrent nature of the pain, the patient reports not taking any medication for symptom management.  In addition to the hip pain, the patient also reports discomfort in the left ear, described as a feeling of tightness with associated hearing loss. The discomfort in the ear is a new symptom, separate from the chronic hip pain.       Outpatient Medications Prior to Visit  Medication Sig Dispense Refill   Cholecalciferol 50 MCG (2000 UT) TABS Take 1 tablet (2,000 Units total) by mouth daily. 90 tablet 3   fluconazole (DIFLUCAN) 150 MG tablet Take 1 tablet (150 mg total) by mouth once a week. 12 tablet 0   levocetirizine (XYZAL) 5 MG tablet TAKE 1 TABLET BY MOUTH ONCE DAILY IN THE EVENING 90 tablet 0   olmesartan (BENICAR) 20 MG tablet Take 1 tablet by mouth once daily 90 tablet 0   rosuvastatin (CRESTOR) 5 MG tablet Take 1 tablet by mouth once daily 90 tablet 0   scopolamine (TRANSDERM-SCOP) 1 MG/3DAYS Place 1 patch (1.5 mg total) onto the skin every 3 (three) days. 4 patch 0   spironolactone (ALDACTONE) 25 MG tablet Take 1 tablet by mouth once daily 90 tablet 0   triamcinolone cream (KENALOG) 0.5 % Apply 1 Application topically 3 (three) times daily. 90 g 2   No facility-administered medications prior to visit.    ROS Review of Systems  Constitutional: Negative.  Negative for chills, diaphoresis and fatigue.  HENT:  Positive for ear pain and hearing  loss.   Eyes: Negative.   Respiratory:  Negative for cough, chest tightness and wheezing.   Cardiovascular:  Negative for chest pain, palpitations and leg swelling.  Gastrointestinal:  Negative for abdominal pain, constipation, diarrhea, nausea and vomiting.  Genitourinary:  Negative for difficulty urinating.  Musculoskeletal:  Positive for arthralgias. Negative for back pain.  Neurological: Negative.  Negative for dizziness and weakness.  Hematological:  Negative for adenopathy. Does not bruise/bleed easily.  Psychiatric/Behavioral: Negative.      Objective:  BP 136/76   Pulse 76   Temp 98.1 F (36.7 C) (Oral)   Resp 16   Wt 157 lb (71.2 kg)   LMP 01/07/2016 (Approximate)   SpO2 97%   BMI 25.34 kg/m   BP Readings from Last 3 Encounters:  10/05/22 136/76  09/30/22 138/66  07/08/22 138/86    Wt Readings from Last 3 Encounters:  10/05/22 157 lb (71.2 kg)  09/30/22 158 lb (71.7 kg)  07/08/22 159 lb (72.1 kg)    Physical Exam Vitals reviewed.  HENT:     Right Ear: Tympanic membrane, ear canal and external ear normal. Decreased hearing noted. No middle ear effusion. There is impacted cerumen.     Left Ear: Hearing, ear canal and external ear normal.  Eyes:     General: No scleral icterus.    Conjunctiva/sclera: Conjunctivae normal.  Cardiovascular:     Rate and Rhythm: Normal rate  and regular rhythm.     Pulses: Normal pulses.     Heart sounds: No murmur heard. Pulmonary:     Effort: Pulmonary effort is normal.     Breath sounds: No stridor. No wheezing, rhonchi or rales.  Abdominal:     General: Abdomen is flat.     Palpations: There is no mass.     Tenderness: There is no abdominal tenderness. There is no guarding.     Hernia: No hernia is present.  Musculoskeletal:        General: Normal range of motion.  Lymphadenopathy:     Cervical: No cervical adenopathy.  Skin:    General: Skin is warm and dry.  Neurological:     General: No focal deficit present.      Mental Status: She is alert.  Psychiatric:        Mood and Affect: Mood normal.        Behavior: Behavior normal.     Lab Results  Component Value Date   WBC 5.2 09/30/2022   HGB 13.9 09/30/2022   HCT 43.2 09/30/2022   PLT 217.0 09/30/2022   GLUCOSE 82 09/30/2022   CHOL 168 03/26/2022   TRIG 49.0 03/26/2022   HDL 74.60 03/26/2022   LDLCALC 84 03/26/2022   ALT 19 09/30/2022   AST 17 09/30/2022   NA 140 09/30/2022   K 4.2 09/30/2022   CL 102 09/30/2022   CREATININE 0.67 09/30/2022   BUN 12 09/30/2022   CO2 30 09/30/2022   TSH 1.33 03/26/2022   HGBA1C 6.4 09/30/2022   MICROALBUR <0.7 09/30/2022    MM DIAG BREAST TOMO BILATERAL  Result Date: 06/04/2022 CLINICAL DATA:  Dry itchy nipple.  No rash. EXAM: DIGITAL DIAGNOSTIC BILATERAL MAMMOGRAM WITH TOMOSYNTHESIS TECHNIQUE: Bilateral digital diagnostic mammography and breast tomosynthesis was performed. COMPARISON:  Previous exam(s). ACR Breast Density Category c: The breasts are heterogeneously dense, which may obscure small masses. FINDINGS: No suspicious masses, calcifications, distortion, or other abnormality in either breast. The right lumpectomy site is stable. IMPRESSION: No mammographic evidence of malignancy. No cause for the patient's symptoms identified. RECOMMENDATION: Treatment of the patient's symptoms should be based on clinical and physical exam given lack of imaging findings. Recommend annual screening mammography. I have discussed the findings and recommendations with the patient. If applicable, a reminder letter will be sent to the patient regarding the next appointment. BI-RADS CATEGORY  2: Benign. Electronically Signed   By: Gerome Sam III M.D.   On: 06/04/2022 13:20   Assessment & Plan:  Hearing loss secondary to cerumen impaction, left -     Ear Lavage  Trochanteric bursitis of left hip -     Ambulatory referral to Sports Medicine     Follow-up: Return in about 6 months (around 04/06/2023).  [name], MDPRE-PROCEDURE EXAM: Left TM cannot be visualized due to total occlusion/impaction of the ear canal.  PROCEDURE INDICATION: remove wax to visualize ear drum & relieve discomfort  CONSENT:  Verbal     PROCEDURE NOTE:     Left EAR:  I used warm water irrigation under direct visualization with the otoscope to free the wax bolus from the ear canal.     POST- PROCEDURE EXAM: TMs successfully visualized and found to have no erythema     The patient tolerated the procedure well.

## 2022-10-04 NOTE — Progress Notes (Unsigned)
Subjective:  Patient ID: Brooke Wood, female    DOB: [birth year]  Age: 57 y.o. MRN: [id number]  CC: Hypertension, Hyperlipidemia, and Diabetes   HPI Brooke Wood presents for f/up ----  Discussed the use of AI scribe software for clinical note transcription with the patient, who gave verbal consent to proceed.  History of Present Illness   The patient presents with chronic hip pain, which was particularly severe upon waking the previous day. The onset of the pain is unclear, as the patient does not recall a specific injury. Despite the recurrent nature of the pain, the patient reports not taking any medication for symptom management.  In addition to the hip pain, the patient also reports discomfort in the left ear, described as a feeling of tightness with associated hearing loss. The discomfort in the ear is a new symptom, separate from the chronic hip pain.       Outpatient Medications Prior to Visit  Medication Sig Dispense Refill   Cholecalciferol 50 MCG (2000 UT) TABS Take 1 tablet (2,000 Units total) by mouth daily. 90 tablet 3   fluconazole (DIFLUCAN) 150 MG tablet Take 1 tablet (150 mg total) by mouth once a week. 12 tablet 0   levocetirizine (XYZAL) 5 MG tablet TAKE 1 TABLET BY MOUTH ONCE DAILY IN THE EVENING 90 tablet 0   olmesartan (BENICAR) 20 MG tablet Take 1 tablet by mouth once daily 90 tablet 0   rosuvastatin (CRESTOR) 5 MG tablet Take 1 tablet by mouth once daily 90 tablet 0   scopolamine (TRANSDERM-SCOP) 1 MG/3DAYS Place 1 patch (1.5 mg total) onto the skin every 3 (three) days. 4 patch 0   spironolactone (ALDACTONE) 25 MG tablet Take 1 tablet by mouth once daily 90 tablet 0   triamcinolone cream (KENALOG) 0.5 % Apply 1 Application topically 3 (three) times daily. 90 g 2   No facility-administered medications prior to visit.    ROS Review of Systems  Objective:  LMP 01/07/2016 (Approximate)   BP Readings from Last 3 Encounters:  09/30/22 138/66  07/08/22  138/86  05/31/22 (!) 135/94    Wt Readings from Last 3 Encounters:  09/30/22 158 lb (71.7 kg)  07/08/22 159 lb (72.1 kg)  05/31/22 156 lb 9.6 oz (71 kg)    Physical Exam HENT:     Right Ear: Hearing normal. No swelling. There is impacted cerumen.     Left Ear: Hearing, tympanic membrane, ear canal and external ear normal. No swelling. There is no impacted cerumen.     Lab Results  Component Value Date   WBC 5.2 09/30/2022   HGB 13.9 09/30/2022   HCT 43.2 09/30/2022   PLT 217.0 09/30/2022   GLUCOSE 82 09/30/2022   CHOL 168 03/26/2022   TRIG 49.0 03/26/2022   HDL 74.60 03/26/2022   LDLCALC 84 03/26/2022   ALT 19 09/30/2022   AST 17 09/30/2022   NA 140 09/30/2022   K 4.2 09/30/2022   CL 102 09/30/2022   CREATININE 0.67 09/30/2022   BUN 12 09/30/2022   CO2 30 09/30/2022   TSH 1.33 03/26/2022   HGBA1C 6.4 09/30/2022   MICROALBUR <0.7 09/30/2022    MM DIAG BREAST TOMO BILATERAL  Result Date: 06/04/2022 CLINICAL DATA:  Dry itchy nipple.  No rash. EXAM: DIGITAL DIAGNOSTIC BILATERAL MAMMOGRAM WITH TOMOSYNTHESIS TECHNIQUE: Bilateral digital diagnostic mammography and breast tomosynthesis was performed. COMPARISON:  Previous exam(s). ACR Breast Density Category c: The breasts are heterogeneously dense, which may obscure small masses.  FINDINGS: No suspicious masses, calcifications, distortion, or other abnormality in either breast. The right lumpectomy site is stable. IMPRESSION: No mammographic evidence of malignancy. No cause for the patient's symptoms identified. RECOMMENDATION: Treatment of the patient's symptoms should be based on clinical and physical exam given lack of imaging findings. Recommend annual screening mammography. I have discussed the findings and recommendations with the patient. If applicable, a reminder letter will be sent to the patient regarding the next appointment. BI-RADS CATEGORY  2: Benign. Electronically Signed   By: Gerome Sam III M.D.   On:  06/04/2022 13:20   Assessment & Plan:  Hearing loss secondary to cerumen impaction, left     Follow-up: Return in about 6 months (around 04/06/2023).  [name], MD

## 2022-10-05 ENCOUNTER — Encounter: Payer: Self-pay | Admitting: Internal Medicine

## 2022-10-14 ENCOUNTER — Ambulatory Visit (INDEPENDENT_AMBULATORY_CARE_PROVIDER_SITE_OTHER): Payer: BC Managed Care – PPO

## 2022-10-14 ENCOUNTER — Encounter: Payer: Self-pay | Admitting: Family Medicine

## 2022-10-14 ENCOUNTER — Ambulatory Visit (INDEPENDENT_AMBULATORY_CARE_PROVIDER_SITE_OTHER): Payer: BC Managed Care – PPO | Admitting: Family Medicine

## 2022-10-14 VITALS — BP 102/80 | HR 101 | Ht 66.0 in | Wt 158.8 lb

## 2022-10-14 DIAGNOSIS — M25551 Pain in right hip: Secondary | ICD-10-CM | POA: Diagnosis not present

## 2022-10-14 DIAGNOSIS — G8929 Other chronic pain: Secondary | ICD-10-CM | POA: Diagnosis not present

## 2022-10-14 DIAGNOSIS — M5441 Lumbago with sciatica, right side: Secondary | ICD-10-CM | POA: Diagnosis not present

## 2022-10-14 DIAGNOSIS — M545 Low back pain, unspecified: Secondary | ICD-10-CM | POA: Diagnosis not present

## 2022-10-14 DIAGNOSIS — M2550 Pain in unspecified joint: Secondary | ICD-10-CM | POA: Diagnosis not present

## 2022-10-14 DIAGNOSIS — R768 Other specified abnormal immunological findings in serum: Secondary | ICD-10-CM

## 2022-10-14 DIAGNOSIS — M5442 Lumbago with sciatica, left side: Secondary | ICD-10-CM

## 2022-10-14 DIAGNOSIS — M439 Deforming dorsopathy, unspecified: Secondary | ICD-10-CM | POA: Diagnosis not present

## 2022-10-14 DIAGNOSIS — M47816 Spondylosis without myelopathy or radiculopathy, lumbar region: Secondary | ICD-10-CM | POA: Diagnosis not present

## 2022-10-14 DIAGNOSIS — R7689 Other specified abnormal immunological findings in serum: Secondary | ICD-10-CM

## 2022-10-14 LAB — SEDIMENTATION RATE: Sed Rate: 29 mm/hr (ref 0–30)

## 2022-10-14 MED ORDER — GABAPENTIN 100 MG PO CAPS: 100.0000 mg | ORAL_CAPSULE | Freq: Three times a day (TID) | ORAL | 3 refills | Status: DC | PRN

## 2022-10-14 NOTE — Progress Notes (Signed)
I, Stevenson Clinch, CMA acting as a scribe for Clementeen Graham, MD.  Brooke Wood is a 57 y.o. female who presents to [facility] at [facility] today for R hip pain x 1 week. No injury. Pt locates pain to lateral aspect of the hip. Pain radiating into the lower leg. Shooting pain in both legs at night. Has been wearing [employer] at work. Was having pain laying on the right side, that has resolved. Denies n/t/w, The knee will buckle at times. Hx of bursitis and arthritis.   Radiates: B LE Aggravates: ambulating, side-lying.  Treatments tried: time, rest  Also c/o BILAT hand pain and stiffness. Sudden onset, sx x 1 day.   Pertinent review of systems: No fevers or chills  Relevant historical information: History of breast cancer.  Works for [employer].   Exam:  BP 102/80   Pulse (!) 101   Ht 5\' 6"  (1.676 m)   Wt 158 lb 12.8 oz (72 kg)   LMP 01/07/2016 (Approximate)   SpO2 97%   BMI 25.63 kg/m  General: Well Developed, well nourished, and in no acute distress.   MSK: Right hip normal appearing. Mildly tender palpation greater trochanter.  Reduced strength hip abduction.  Normal hip motion.  L-spine: Normal appearing. Nontender palpation. Normal lumbar motion. Reflexes are intact. Lower extremity strength is generally reduced throughout the entire exam.  Hands bilaterally minimal swelling across MCPs.  Nontender normal hand motion.   Lab and Radiology Results  X-ray images lumbar spine and right hip obtained today personally and independently interpreted  L-spine: Mild DDD L4-5.  No acute fractures are visible.  Right hip: No acute fractures.  No significant or severe degenerative changes.  Await formal radiology review    Assessment and Plan: 57 y.o. female with leg pain and right lateral hip pain thought to be trochanteric bursitis and perhaps some lumbar radiculopathy.  She has general weakness and general myalgias which is concerning for  inflammatory disease.  Plan for x-rays and a rheumatologic workup now.  Will refer to physical therapy.  She is leaving for a cruise in about a month.  Will see her in about 3 weeks to reevaluate and potentially proceed with steroid injection if needed. Gabapentin at bedtime as needed prescribed as well.  PDMP not reviewed this encounter. Orders Placed This Encounter  Procedures   DG Lumbar Spine 2-3 Views    Standing Status:   Future    Standing Expiration Date:   11/14/2022    Order Specific Question:   Reason for Exam (SYMPTOM  OR DIAGNOSIS REQUIRED)    Answer:   low back pain    Order Specific Question:   Preferred imaging location?    Answer:   [facility]    Order Specific Question:   Is patient pregnant?    Answer:   No   DG HIP UNILAT W OR W/O PELVIS 2-3 VIEWS RIGHT    Standing Status:   Future    Standing Expiration Date:   11/14/2022    Order Specific Question:   Reason for Exam (SYMPTOM  OR DIAGNOSIS REQUIRED)    Answer:   right hip pain    Order Specific Question:   Preferred imaging location?    Answer:   [facility]    Order Specific Question:   Is patient pregnant?    Answer:   No   ANA    Standing Status:   Future    Standing Expiration  Date:   10/14/2023   Cyclic citrul peptide antibody, IgG    Standing Status:   Future    Standing Expiration Date:   10/14/2023   HLA-B27 antigen    Polyarthalgia    Standing Status:   Future    Standing Expiration Date:   10/14/2023   Rheumatoid factor    Standing Status:   Future    Standing Expiration Date:   10/14/2023   Sedimentation rate    Standing Status:   Future    Standing Expiration Date:   10/14/2023   Ambulatory referral to Physical Therapy    Referral Priority:   Routine    Referral Type:   Physical Medicine    Referral Reason:   Specialty Services Required    Requested Specialty:   Physical Therapy    Number of Visits Requested:   1   Meds ordered this encounter  Medications   gabapentin  (NEURONTIN) 100 MG capsule    Sig: Take 1-3 capsules (100-300 mg total) by mouth 3 (three) times daily as needed.    Dispense:  90 capsule    Refill:  3     Discussed warning signs or symptoms. Please see discharge instructions. Patient expresses understanding.   The above documentation has been reviewed and is accurate and complete Clementeen Graham, M.D.

## 2022-10-14 NOTE — Patient Instructions (Addendum)
Thank you for coming in today.   Please get an Xray today before you leave   Please get labs today before you leave   I've referred you to Physical Therapy.  Let us know if you don't hear from them in one week.   Check back the week of August 5th

## 2022-10-15 NOTE — Progress Notes (Signed)
Lumbar spine x-ray shows some arthritis changes at the base of the spine.

## 2022-10-16 LAB — ANTI-NUCLEAR AB-TITER (ANA TITER)
ANA TITER: 1:80 {titer} — ABNORMAL HIGH
ANA Titer 1: 1:80 {titer} — ABNORMAL HIGH

## 2022-10-16 LAB — CYCLIC CITRUL PEPTIDE ANTIBODY, IGG: Cyclic Citrullin Peptide Ab: <16 UNITS

## 2022-10-16 LAB — HLA-B27 ANTIGEN: HLA-B27 Antigen: NEGATIVE

## 2022-10-16 LAB — ANA: Anti Nuclear Antibody (ANA): POSITIVE — AB

## 2022-10-16 LAB — RHEUMATOID FACTOR: Rheumatoid fact SerPl-aCnc: 10 IU/mL (ref <14)

## 2022-10-17 NOTE — Therapy (Addendum)
OUTPATIENT PHYSICAL THERAPY THORACOLUMBAR EVALUATION  / DISCHARGE   Patient Name: Brooke Wood MRN: [id number] DOB:1966/03/22, 57 y.o., female Today's Date: 10/21/2022  END OF SESSION:  PT End of Session - 10/21/22 1053     Visit Number 1    Number of Visits 5    Date for PT Re-Evaluation 11/28/22    Authorization Type BCBS    PT Start Time 1056    PT Stop Time 1137    PT Time Calculation (min) 41 min    Activity Tolerance Patient tolerated treatment well;No increased pain    Behavior During Therapy [facility] for tasks assessed/performed             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   Hx of adenomatous colonic polyps 03/03/2014   repeat 2022   Hx of radiation therapy 06/15/12 - 07/29/12   right rbeast, 50.4 Gy x 28 fx, boost to cumulative dose 60.4 gray   Hypertension    Personal history of radiation therapy 2014   PONV (postoperative nausea and vomiting)    SVD (spontaneous vaginal delivery)    x 1   Vocal cord dysfunction    Neg Methacholine challenge test 2007   Past Surgical History:  Procedure Laterality Date   BREAST LUMPECTOMY Right 2014   BREAST LUMPECTOMY WITH SENTINEL LYMPH NODE BIOPSY  04/09/2012   Procedure: BREAST LUMPECTOMY WITH SENTINEL LYMPH NODE BX;  Surgeon: Robyne Askew, MD;  Location: [facility];  Service: General;  Laterality: Right;   BREAST SURGERY  1985   rt br bx   COLONOSCOPY  03/2014   poylps   COLONOSCOPY WITH PROPOFOL N/A 12/06/2021   Procedure: COLONOSCOPY WITH PROPOFOL;  Surgeon: Iva Boop, MD;  Location: [facility];  Service: Gastroenterology;  Laterality: N/A;   CYSTOSCOPY N/A 01/22/2017   Procedure: CYSTOSCOPY;  Surgeon: Carrington Clamp, MD;  Location: [facility];  Service: Gynecology;  Laterality: N/A;   POLYPECTOMY  12/06/2021   Procedure:  POLYPECTOMY;  Surgeon: Iva Boop, MD;  Location: [facility];  Service: Gastroenterology;;   ROBOTIC ASSISTED TOTAL HYSTERECTOMY WITH BILATERAL SALPINGO OOPHERECTOMY Bilateral 01/22/2017   Procedure: ROBOTIC ASSISTED TOTAL HYSTERECTOMY WITH BILATERAL SALPINGECTOMY AND LEFT OOPHORECTOMY;  Surgeon: Carrington Clamp, MD;  Location: [facility];  Service: Gynecology;  Laterality: Bilateral;   Patient Active Problem List   Diagnosis Date Noted   Hearing loss secondary to cerumen impaction, left 10/04/2022   DOE (dyspnea on exertion) 09/30/2022   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   Motion sickness 09/30/2022   Encounter for general adult medical examination with abnormal findings 03/26/2022   Onychomycosis of toenail 03/16/2020   Bilateral hearing loss 08/16/2019   Carcinoma of breast (HCC) 02/19/2019   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   Trochanteric bursitis of left hip 08/11/2018   Type II diabetes mellitus with manifestations (HCC) 07/02/2018   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   Abnormal electrocardiogram (ECG) (EKG) 10/08/2017   Hx of adenomatous colonic polyps 03/03/2014   Routine general medical examination at a health care facility 06/03/2013   Hyperlipidemia with target LDL less than 130 11/24/2012   Malignant neoplasm of upper-outer quadrant of right breast in female, estrogen receptor positive (HCC) 03/06/2012   Goiter 07/26/2009   Essential hypertension, benign 01/19/2007   Allergic rhinitis 01/19/2007  PCP: Etta Grandchild, MD  REFERRING PROVIDER: Rodolph Bong, MD  REFERRING DIAG: [phone] (ICD-10-CM) - Right hip pain M54.42,M54.41,G89.29 (ICD-10-CM) - Chronic bilateral low back pain with bilateral sciatica  Rationale for Evaluation and Treatment: Rehabilitation  THERAPY DIAG:  Pain in right hip  Muscle weakness (generalized)  ONSET DATE: a couple weeks ago  SUBJECTIVE:                                                                                                                                                                                            SUBJECTIVE STATEMENT: Pt states she had a chronic issue with R hip, attributes to bursitis/arthritis, states that she had a flareup towards the end of June/beginning of July but can't recall any precipitating factor. Feels it is essentially back to baseline, hasn't had much pain since the beginning of July. States she was initially having some referral into anterior R thigh, denies overt LE symptoms. States that right now her main trouble is distal BIL LE pain at end of shifts with prolonged standing/walking.   PERTINENT HISTORY:  HTN, DM2, hx breast cancer  PAIN:  Are you having pain: none Location/description: R hip into anterior thigh  Best-worst over past week: denies any pain over past week  - aggravating factors: prolonged WB - Easing factors: resting, elevating LE    PRECAUTIONS: hx cancer, RUE restricted  WEIGHT BEARING RESTRICTIONS: No  FALLS:  Has patient fallen in last 6 months? No - near fall on steps "a while back"  LIVING ENVIRONMENT: Lives in an apartment - two levels. Has a flight of steps within her apartment. Trying to move into ILF/ALF Lives alone.   OCCUPATION: working at [employer], variable duties, has to be on feet a lot  PLOF: Independent  PATIENT GOALS: "get these bones working right"   NEXT MD VISIT: sees Dr. Denyse Amass 11/04/22  OBJECTIVE:   DIAGNOSTIC FINDINGS:  10/14/22 hip and lumbar XR; refer to 7/15 sports med notes but no acute fractures, awaiting formal radiology review  PATIENT SURVEYS:  FOTO 97 current, 98 predicted  SCREENING FOR RED FLAGS: Red flag questioning/screening reassuring    COGNITION: Overall cognitive status: Within functional limits for tasks assessed     SENSATION: Light touch intact B LE, no clonus either LE   MUSCLE LENGTH: NT  POSTURE: forward head posture, rounded shoulders, reduced lordosis  PALPATION: Concordant tenderness R greater  trochanter and TFL   LUMBAR ROM:   AROM eval  Flexion 50% (to knees, painless)  Extension 75% stiff  Right lateral flexion Just above knee joint  Left lateral flexion Just above knee joint  Right rotation 50% s  Left rotation 50% s   (Blank rows = not tested) (Key: WFL = within functional limits not formally assessed, * = concordant pain, s = stiffness/stretching sensation, NT = not tested)   LOWER EXTREMITY ROM:     Active  Right eval Left eval  Hip flexion    Hip extension    Hip internal rotation    Hip external rotation    Knee extension    Knee flexion    (Blank rows = not tested) (Key: WFL = within functional limits not formally assessed, * = concordant pain, s = stiffness/stretching sensation, NT = not tested)  Comments: mild reduction in IR ROM L hip compared to R, reduced ER ROM R hip compared to L; overall arc about the same bilaterally   LOWER EXTREMITY MMT:    MMT Right eval Left eval  Hip flexion 4 4  Hip abduction (modified sitting) 5 5  Hip internal rotation    Hip external rotation    Knee flexion 4+ 4+  Knee extension 5 5  Ankle dorsiflexion 5 5   (Blank rows = not tested) (Key: WFL = within functional limits not formally assessed, * = concordant pain, s = stiffness/stretching sensation, NT = not tested)  Comments: MMT nonpainful  LUMBAR SPECIAL TESTS:  Slump test + and equal bilaterally for reproduction of ankle/calf pain  FUNCTIONAL TESTS:  Sit to stand: increased time and effort, B UE support and reduced fwd trunk lean, painless  GAIT: Distance walked: within clinic Assistive device utilized: None Level of assistance: Complete Independence Comments: mildly reduced gait speed/cadence, reduced trunk rotation/arm swing BIL   TODAY'S TREATMENT:                                                                                                                              [facility] Adult PT Treatment:                                                 DATE: 10/21/22 Therapeutic Exercise: Seated adductor iso x10 cues for comfortable contraction and breath control Seated march x10 BIL HEP handout + education    PATIENT EDUCATION:  Education details: Pt education on PT impairments, prognosis, and POC. Informed consent. Rationale for interventions, safe/appropriate HEP performance Person educated: Patient Education method: Explanation, Demonstration, Tactile cues, Verbal cues, and Handouts Education comprehension: verbalized understanding, returned demonstration, verbal cues required, tactile cues required, and needs further education    HOME EXERCISE PROGRAM: Access Code: [email or web address] URL: [email or web address] Date: 10/21/2022 Prepared by: Fransisco Hertz  Exercises - Seated Hip Adduction Isometrics with Newman Pies  - 2-3 x daily - 7 x weekly - 1 sets - 10 reps - Seated March  - 2-3 x daily - 7 x weekly - 1 sets - 10 reps  ASSESSMENT:  CLINICAL IMPRESSION: Pt is a pleasant 57 year old woman who arrives to PT evaluation on this date for R hip and low back pain. Pt states this was an acute on chronic issue and she has essentially returned to her baseline, not having direct hip pain over past week, primary complaint of distal B LE pain with prolonged standing at work. On exam pt demos mild generalized weakness, generalized lumbar stiffness although these do not produce symptoms. Concordant hip pain reproduced with palpation of R greater trochanter and TFL, + slump test bilaterally for reproduction of distal LE pain. Although pt symptoms appear to have returned to her reported baseline, recommend trial of skilled PT to monitor progression of symptoms and address relevant deficits in order to improve pt self efficacy with management of this reportedly acute on chronic issue. She verbalizes agreement/understanding with this plan. Pt tolerates HEP well without issues, no adverse events. Pt departs today's session in no acute distress, all  voiced questions/concerns addressed appropriately from PT perspective.    OBJECTIVE IMPAIRMENTS: Abnormal gait, decreased activity tolerance, decreased endurance, decreased ROM, decreased strength, improper body mechanics, and postural dysfunction.   ACTIVITY LIMITATIONS: standing, stairs, and transfers  PARTICIPATION LIMITATIONS: community activity and occupation  PERSONAL FACTORS: Time since onset of injury/illness/exacerbation and 3+ comorbidities: DM, hx cancer, HTN  are also affecting patient's functional outcome.   REHAB POTENTIAL: Good  CLINICAL DECISION MAKING: Stable/uncomplicated  EVALUATION COMPLEXITY: Low   GOALS: Goals reviewed with patient? No  SHORT TERM GOALS: Target date: 11/28/2022 1.  Pt will demonstrate at least 75% and painless lumbar rotation AROM bilaterally in order to demonstrate improved tolerance to functional movement patterns.  Baseline: see ROM chart above Goal status: INITIAL  2.  Pt will demonstrate at least 4+/5 MMT globally throughout BIL LE in order to demonstrate improved strength for functional movements.  Baseline: see MMT chart above Goal status: INITIAL  3. Pt will demonstrate appropriate performance of final prescribed HEP in order to facilitate improved self-management of symptoms post-discharge.   Baseline: initial HEP prescribed  Goal status: INITIAL    4. Pt will endorse no more than 3 pt increase in resting pain on NPS in either LE during work day in order to promote improved tolerance with work tasks.  Baseline: increase in BIL LE calf/ankle pain at end of day (reproduced by slump test on eval)  Goal status: INITIAL  PLAN:  PT FREQUENCY: 1x/week   PT DURATION: 4 weeks (dates extended due to pt being out of town for [facility] during POC)  PLANNED INTERVENTIONS: Therapeutic exercises, Therapeutic activity, Neuromuscular re-education, Balance training, Gait training, Patient/Family education, Self Care, Joint mobilization, Stair  training, Dry Needling, Spinal mobilization, Cryotherapy, Moist heat, Taping, Manual therapy, and Re-evaluation.  PLAN FOR NEXT SESSION: Review/update HEP PRN. Emphasis on lumbar mobility within pt tolerance, work on LE strengthening and endurance. Could consider sciatic nerve glides. If continues along current trajectory could likely discharge in a few visits once independent with HEP to manage symptoms, otherwise re-assess to update POC    Ashley Murrain PT, DPT 10/21/2022 12:59 PM    PHYSICAL THERAPY DISCHARGE SUMMARY  Visits from Start of Care: 1  Current functional level related to goals / functional outcomes: See note   Remaining deficits: See note   Education / Equipment: HEP  Patient goals were not met. Patient is being discharged due to not returning since the last visit.  Chyrel Masson, PT, DPT, OCS, ATC 01/07/23  11:28 AM

## 2022-10-17 NOTE — Progress Notes (Signed)
1 lab is mildly positive.  The ANA titer is a little bit positive at 1: 80.  This could represent rheumatologic disease such as lupus.  Typically be expected to be higher than that for lupus.  Additionally other labs are negative which is good.  I am going to refer you to a rheumatologist to confirm that you do not have a rheumatologic problem or if you do treat it.  You should hear from [facility] rheumatology soon.

## 2022-10-17 NOTE — Addendum Note (Signed)
Addended by: Rodolph Bong on: 10/17/2022 10:02 AM   Modules accepted: Orders

## 2022-10-18 ENCOUNTER — Telehealth: Payer: Self-pay

## 2022-10-18 NOTE — Telephone Encounter (Signed)
Patient called for lab result. Results were given to patient and patient stated understanding. Patient had some questions about lupus and informed her what it was and where we were referring her. Informed her that if she does not hear from them with in a week to let us know. Patient states when she is somewhere she can get a pen and paper she will call back for [facility] phone number which is [phone]

## 2022-10-21 ENCOUNTER — Ambulatory Visit (INDEPENDENT_AMBULATORY_CARE_PROVIDER_SITE_OTHER): Payer: BC Managed Care – PPO | Admitting: Physical Therapy

## 2022-10-21 ENCOUNTER — Other Ambulatory Visit: Payer: Self-pay

## 2022-10-21 ENCOUNTER — Encounter: Payer: Self-pay | Admitting: Physical Therapy

## 2022-10-21 DIAGNOSIS — M6281 Muscle weakness (generalized): Secondary | ICD-10-CM

## 2022-10-21 DIAGNOSIS — M25551 Pain in right hip: Secondary | ICD-10-CM | POA: Diagnosis not present

## 2022-10-21 NOTE — Progress Notes (Signed)
The right hip and the hip x-ray looks okay.  There is a little bit of arthritis in the lower portion of the spine.

## 2022-10-28 ENCOUNTER — Ambulatory Visit
Admission: RE | Admit: 2022-10-28 | Discharge: 2022-10-28 | Disposition: A | Discharge status: Home / Self Care | Payer: BC Managed Care – PPO | Source: Ambulatory Visit | Attending: Internal Medicine | Admitting: Internal Medicine

## 2022-10-28 DIAGNOSIS — I2510 Atherosclerotic heart disease of native coronary artery without angina pectoris: Secondary | ICD-10-CM | POA: Diagnosis not present

## 2022-10-28 DIAGNOSIS — R0602 Shortness of breath: Secondary | ICD-10-CM | POA: Diagnosis not present

## 2022-10-28 DIAGNOSIS — R0609 Other forms of dyspnea: Secondary | ICD-10-CM

## 2022-10-28 DIAGNOSIS — R079 Chest pain, unspecified: Secondary | ICD-10-CM | POA: Diagnosis not present

## 2022-10-31 ENCOUNTER — Encounter: Payer: BC Managed Care – PPO | Admitting: Rehabilitative and Restorative Service Providers"

## 2022-11-04 ENCOUNTER — Ambulatory Visit: Payer: BC Managed Care – PPO | Admitting: Family Medicine

## 2022-11-07 ENCOUNTER — Encounter: Payer: BC Managed Care – PPO | Admitting: Rehabilitative and Restorative Service Providers"

## 2022-11-28 ENCOUNTER — Encounter: Payer: BC Managed Care – PPO | Admitting: Rehabilitative and Restorative Service Providers"

## 2022-12-05 ENCOUNTER — Encounter: Payer: BC Managed Care – PPO | Admitting: Physical Therapy

## 2022-12-23 DIAGNOSIS — M2550 Pain in unspecified joint: Secondary | ICD-10-CM | POA: Diagnosis not present

## 2022-12-23 DIAGNOSIS — R768 Other specified abnormal immunological findings in serum: Secondary | ICD-10-CM | POA: Diagnosis not present

## 2022-12-23 DIAGNOSIS — M359 Systemic involvement of connective tissue, unspecified: Secondary | ICD-10-CM | POA: Diagnosis not present

## 2022-12-23 DIAGNOSIS — M545 Low back pain, unspecified: Secondary | ICD-10-CM | POA: Diagnosis not present

## 2022-12-30 ENCOUNTER — Ambulatory Visit (INDEPENDENT_AMBULATORY_CARE_PROVIDER_SITE_OTHER): Payer: BC Managed Care – PPO

## 2022-12-30 ENCOUNTER — Encounter: Payer: Self-pay | Admitting: Internal Medicine

## 2022-12-30 ENCOUNTER — Ambulatory Visit (INDEPENDENT_AMBULATORY_CARE_PROVIDER_SITE_OTHER): Payer: BC Managed Care – PPO | Admitting: Internal Medicine

## 2022-12-30 VITALS — BP 126/66 | HR 92 | Temp 98.1°F | Ht 66.0 in | Wt 163.0 lb

## 2022-12-30 DIAGNOSIS — R140 Abdominal distension (gaseous): Secondary | ICD-10-CM | POA: Diagnosis not present

## 2022-12-30 DIAGNOSIS — R10816 Epigastric abdominal tenderness: Secondary | ICD-10-CM

## 2022-12-30 DIAGNOSIS — Z23 Encounter for immunization: Secondary | ICD-10-CM | POA: Diagnosis not present

## 2022-12-30 NOTE — Progress Notes (Signed)
Subjective:  Patient ID: Brooke Wood, female    DOB: May 05, 1965  Age: 57 y.o. MRN: [id number]  CC: Abdominal Pain   HPI Brooke Wood presents for f/up ----  Discussed the use of AI scribe software for clinical note transcription with the patient, who gave verbal consent to proceed.  History of Present Illness   The patient presents with a recent onset of abdominal discomfort, described as a sensation of bloating or "blowing up," particularly noticeable when moving. This symptom started approximately one to two weeks ago. The patient also reports a single episode of vomiting last week, which they attribute to a possible food poisoning incident after dining out. However, no further episodes of vomiting have occurred since then.  Bowel movements are reported as normal, with no constipation or diarrhea. The patient maintains a good appetite and has not experienced any weight loss, in fact, they report a recent weight gain. There have been no accompanying symptoms such as fever, chills, or urinary issues.  The patient has not taken any medication for these symptoms. They speculate about the possibility of acid reflux, mentioning occasional upper abdominal tightness, but deny classic symptoms such as heartburn, indigestion, or trouble swallowing. They are not currently on any treatment for acid reflux.       Outpatient Medications Prior to Visit  Medication Sig Dispense Refill   Cholecalciferol 50 MCG (2000 UT) TABS Take 1 tablet (2,000 Units total) by mouth daily. 90 tablet 3   fluconazole (DIFLUCAN) 150 MG tablet Take 1 tablet (150 mg total) by mouth once a week. 12 tablet 0   gabapentin (NEURONTIN) 100 MG capsule Take 1-3 capsules (100-300 mg total) by mouth 3 (three) times daily as needed. 90 capsule 3   levocetirizine (XYZAL) 5 MG tablet TAKE 1 TABLET BY MOUTH ONCE DAILY IN THE EVENING 90 tablet 0   olmesartan (BENICAR) 20 MG tablet Take 1 tablet by mouth once daily 90 tablet 0    rosuvastatin (CRESTOR) 5 MG tablet Take 1 tablet by mouth once daily 90 tablet 0   scopolamine (TRANSDERM-SCOP) 1 MG/3DAYS Place 1 patch (1.5 mg total) onto the skin every 3 (three) days. 4 patch 0   spironolactone (ALDACTONE) 25 MG tablet Take 1 tablet by mouth once daily 90 tablet 0   triamcinolone cream (KENALOG) 0.5 % Apply 1 Application topically 3 (three) times daily. 90 g 2   No facility-administered medications prior to visit.    ROS Review of Systems  Constitutional: Negative.  Negative for appetite change, fatigue and unexpected weight change.  HENT: Negative.    Respiratory: Negative.  Negative for cough, chest tightness, shortness of breath and wheezing.   Cardiovascular:  Negative for chest pain, palpitations and leg swelling.  Gastrointestinal:  Positive for abdominal pain. Negative for constipation, diarrhea, nausea, rectal pain and vomiting.  Genitourinary: Negative.  Negative for difficulty urinating, dysuria and hematuria.  Musculoskeletal: Negative.   Skin: Negative.   Neurological:  Negative for dizziness, weakness, light-headedness and headaches.  Hematological:  Negative for adenopathy. Does not bruise/bleed easily.  Psychiatric/Behavioral: Negative.      Objective:  BP 126/66 (BP Location: Left Arm, Patient Position: Sitting, Cuff Size: Large)   Pulse 92   Temp 98.1 F (36.7 C) (Oral)   Ht 5\' 6"  (1.676 m)   Wt 163 lb (73.9 kg)   LMP 01/07/2016 (Approximate)   SpO2 96%   BMI 26.31 kg/m   BP Readings from Last 3 Encounters:  12/30/22 126/66  10/14/22  102/80  10/05/22 136/76    Wt Readings from Last 3 Encounters:  12/30/22 163 lb (73.9 kg)  10/14/22 158 lb 12.8 oz (72 kg)  10/05/22 157 lb (71.2 kg)    Physical Exam Vitals reviewed.  Constitutional:      General: She is not in acute distress.    Appearance: She is not ill-appearing, toxic-appearing or diaphoretic.  HENT:     Mouth/Throat:     Mouth: Mucous membranes are moist.  Eyes:      General: No scleral icterus.    Conjunctiva/sclera: Conjunctivae normal.  Cardiovascular:     Rate and Rhythm: Normal rate and regular rhythm.     Heart sounds: No murmur heard.    No gallop.  Pulmonary:     Effort: Pulmonary effort is normal.     Breath sounds: No stridor. No wheezing, rhonchi or rales.  Abdominal:     General: Abdomen is protuberant. Bowel sounds are decreased. There is no distension.     Palpations: There is no hepatomegaly, splenomegaly or mass.     Tenderness: There is abdominal tenderness in the epigastric area. There is no guarding or rebound.     Hernia: No hernia is present.  Musculoskeletal:        General: Normal range of motion.     Cervical back: Neck supple.     Right lower leg: No edema.     Left lower leg: No edema.  Skin:    General: Skin is warm and dry.  Neurological:     General: No focal deficit present.     Mental Status: She is alert. Mental status is at baseline.  Psychiatric:        Mood and Affect: Mood normal.        Behavior: Behavior normal.     Lab Results  Component Value Date   WBC 5.2 09/30/2022   HGB 13.9 09/30/2022   HCT 43.2 09/30/2022   PLT 217.0 09/30/2022   GLUCOSE 82 09/30/2022   CHOL 168 03/26/2022   TRIG 49.0 03/26/2022   HDL 74.60 03/26/2022   LDLCALC 84 03/26/2022   ALT 19 09/30/2022   AST 17 09/30/2022   NA 140 09/30/2022   K 4.2 09/30/2022   CL 102 09/30/2022   CREATININE 0.67 09/30/2022   BUN 12 09/30/2022   CO2 30 09/30/2022   TSH 1.33 03/26/2022   HGBA1C 6.4 09/30/2022   MICROALBUR <0.7 09/30/2022    CT CARDIAC SCORING ([facility] LOCATIONS ONLY)  Result Date: 10/28/2022 CLINICAL DATA:  57 year old African American female with history of chest pain, hypertension, elevated cholesterol and shortness of breath with exertion. * Tracking Code: FCC * EXAM: CT CARDIAC CORONARY ARTERY CALCIUM SCORE TECHNIQUE: Non-contrast imaging through the heart was performed using prospective ECG gating. Image post  processing was performed on an independent workstation, allowing for quantitative analysis of the heart and coronary arteries. Note that this exam targets the heart and the chest was not imaged in its entirety. COMPARISON:  No priors. FINDINGS: CORONARY CALCIUM SCORES: Left Main: 0 LAD: 73 LCx: 33 RCA: 0 Total Agatston Score: 106 MESA database percentile: 93rd AORTA MEASUREMENTS: Ascending Aorta: 3.0 cm Descending Aorta:2.2 cm OTHER FINDINGS: Within the visualized portions of the thorax there are no suspicious appearing pulmonary nodules or masses, there is no acute consolidative airspace disease, no pleural effusions, no pneumothorax and no lymphadenopathy. Visualized portions of the upper abdomen are unremarkable. Surgical clips in the right axillary region, likely from prior lymph node dissection.  There are no aggressive appearing lytic or blastic lesions noted in the visualized portions of the skeleton. IMPRESSION: 1. Patient's total coronary artery calcium score is 106 which is 93rd percentile for patient's of matched age, gender and race/ethnicity. Please note that although the presence of coronary artery calcium documents the presence of coronary artery disease, the severity of this disease and any potential stenosis cannot be assessed on this noncontrast CT examination. Assessment for potential risk factor modification, dietary therapy or pharmacologic therapy may be warranted, if clinically indicated. 2. No significant incidental noncardiac findings are noted. Electronically Signed   By: Trudie Reed M.D.   On: 10/28/2022 10:49   DG ABD ACUTE 2+V W 1V CHEST  Result Date: 12/30/2022 CLINICAL DATA:  abd bloating EXAM: DG ABDOMEN ACUTE WITH 1 VIEW CHEST COMPARISON:  None Available. FINDINGS: There is no evidence of dilated bowel loops or free intraperitoneal air. No radiopaque calculi or other significant radiographic abnormality is seen. Heart size and mediastinal contours are within normal limits. No  pleural effusion. No pneumothorax. No focal airspace opacity. Surgical clips in the right axilla. IMPRESSION: 1. Nonobstructive bowel gas pattern. 2. No acute cardiopulmonary process. Electronically Signed   By: Lorenza Cambridge M.D.   On: 12/30/2022 09:00     Assessment & Plan:   Epigastric abdominal tenderness without rebound tenderness- She has a paucity of symptoms.  The abdominal examination is reassuring.  Plain films are unremarkable.  Will monitor the symptoms. -     DG ABD ACUTE 2+V W 1V CHEST; Future     Follow-up: No follow-ups on file.  [name], MD

## 2022-12-30 NOTE — Patient Instructions (Signed)
Abdominal Pain, Adult  Pain in the abdomen (abdominal pain) can be caused by many things. In most cases, it gets better with no treatment or by being treated at home. But in some cases, it can be serious. Your health care provider will ask questions about your medical history and do a physical exam to try to figure out what is causing your pain. Follow these instructions at home: Medicines Take over-the-counter and prescription medicines only as told by your provider. Do not take medicines that help you poop (laxatives) unless told by your provider. General instructions Watch your condition for any changes. Drink enough fluid to keep your pee (urine) pale yellow. Contact a health care provider if: Your pain changes, gets worse, or lasts longer than expected. You have severe cramping or bloating in your abdomen, or you vomit. Your pain gets worse with meals, after eating, or with certain foods. You are constipated or have diarrhea for more than 2-3 days. You are not hungry, or you lose weight without trying. You have signs of dehydration. These may include: Dark pee, very little pee, or no pee. Cracked lips or dry mouth. Sleepiness or weakness. You have pain when you pee (urinate) or poop. Your abdominal pain wakes you up at night. You have blood in your pee. You have a fever. Get help right away if: You cannot stop vomiting. Your pain is only in one part of the abdomen. Pain on the right side could be caused by appendicitis. You have bloody or black poop (stool), or poop that looks like tar. You have trouble breathing. You have chest pain. These symptoms may be an emergency. Get help right away. Call 911. Do not wait to see if the symptoms will go away. Do not drive yourself to the hospital. This information is not intended to replace advice given to you by your health care provider. Make sure you discuss any questions you have with your health care provider. Document Revised:  01/02/2022 Document Reviewed: 01/02/2022 Elsevier Patient Education  2024 [employer]  

## 2023-01-14 ENCOUNTER — Other Ambulatory Visit: Payer: Self-pay | Admitting: Internal Medicine

## 2023-01-14 DIAGNOSIS — I10 Essential (primary) hypertension: Secondary | ICD-10-CM

## 2023-01-14 DIAGNOSIS — E876 Hypokalemia: Secondary | ICD-10-CM

## 2023-01-14 DIAGNOSIS — E785 Hyperlipidemia, unspecified: Secondary | ICD-10-CM

## 2023-01-14 DIAGNOSIS — E118 Type 2 diabetes mellitus with unspecified complications: Secondary | ICD-10-CM

## 2023-03-10 ENCOUNTER — Ambulatory Visit (INDEPENDENT_AMBULATORY_CARE_PROVIDER_SITE_OTHER): Payer: BC Managed Care – PPO

## 2023-03-10 ENCOUNTER — Other Ambulatory Visit: Payer: Self-pay

## 2023-03-10 ENCOUNTER — Ambulatory Visit (INDEPENDENT_AMBULATORY_CARE_PROVIDER_SITE_OTHER): Payer: BC Managed Care – PPO | Admitting: Family Medicine

## 2023-03-10 VITALS — BP 124/84 | HR 76 | Ht 66.0 in | Wt 163.0 lb

## 2023-03-10 DIAGNOSIS — G8929 Other chronic pain: Secondary | ICD-10-CM | POA: Diagnosis not present

## 2023-03-10 DIAGNOSIS — M79662 Pain in left lower leg: Secondary | ICD-10-CM | POA: Diagnosis not present

## 2023-03-10 DIAGNOSIS — M25561 Pain in right knee: Secondary | ICD-10-CM

## 2023-03-10 MED ORDER — GABAPENTIN 100 MG PO CAPS
100.0000 mg | ORAL_CAPSULE | Freq: Every evening | ORAL | 3 refills | Status: DC | PRN
Start: 2023-03-10 — End: 2023-04-23

## 2023-03-10 NOTE — Patient Instructions (Addendum)
Thank you for coming in today.   Please get an Xray today before you leave   Please use Voltaren gel (Generic Diclofenac Gel) up to 4x daily for pain as needed.  This is available over-the-counter as both the name brand Voltaren gel and the generic diclofenac gel.   You received an injection today. Seek immediate medical attention if the joint becomes red, extremely painful, or is oozing fluid.   Try the gabapentin for nerve pain  Check back as needed

## 2023-03-10 NOTE — Progress Notes (Signed)
Brooke Payor, PhD, LAT, ATC acting as a scribe for Brooke Graham, MD.  Brooke Wood is a 57 y.o. female who presents to [facility] at [facility] today for R knee pain. Pt was previously seen by Dr. Denyse Amass on 10/14/22 for R hip, back pain, and polyarthralgia.  Today, pt c/o R knee pain x 1 wk. No injury or fall. Pt locates pain to the anterior-medial aspect of her R knee. She stands on concrete floors all day for work at [employer] Knee swelling: yes Mechanical symptoms: no Aggravates: cold weather Treatments tried: none  Additionally she has some pain radiating down the left lateral lower leg especially at bedtime.  She denies any weakness or numbness.  Dx testing: 10/14/22 Labs  Pertinent review of systems: No fevers or chills  Relevant historical information: Hypertension.  History of breast cancer.   Exam:  BP 124/84   Pulse 76   Ht 5\' 6"  (1.676 m)   Wt 163 lb (73.9 kg)   LMP 01/07/2016 (Approximate)   SpO2 98%   BMI 26.31 kg/m  General: Well Developed, well nourished, and in no acute distress.   MSK: Right knee mild effusion normal-appearing otherwise.  Normal knee motion. Tender palpation medial joint line. Stable ligamentous exam. Positive McMurray's test.  Left lower leg nontender to palpation.  Negative slump test.    Lab and Radiology Results  Procedure: Real-time Ultrasound Guided Injection of right knee joint superior lateral patella space Device: Philips Affiniti 50G/GE Logiq Images permanently stored and available for review in PACS Verbal informed consent obtained.  Discussed risks and benefits of procedure. Warned about infection, bleeding, hyperglycemia damage to structures among others. Patient expresses understanding and agreement Time-out conducted.   Noted no overlying erythema, induration, or other signs of local infection.   Skin prepped in a sterile fashion.   Local anesthesia: Topical Ethyl chloride.   With sterile  technique and under real time ultrasound guidance: 40 mg of Kenalog and 2 mL of Marcaine injected into knee joint. Fluid seen entering the joint capsule.   Completed without difficulty   Pain immediately resolved suggesting accurate placement of the medication.   Advised to call if fevers/chills, erythema, induration, drainage, or persistent bleeding.   Images permanently stored and available for review in the ultrasound unit.  Impression: Technically successful ultrasound guided injection.   X-ray images right knee obtained today personally and independently interpreted Mild medial and patellofemoral DJD.  No acute fractures. Await formal radiology review   EXAM: LUMBAR SPINE - 2-3 VIEW   COMPARISON:  June 27, 2016   FINDINGS: There is no evidence of lumbar spine fracture. Mild curvature of spine. Mild anterior spurring identified throughout lumbar spine. Mild decreased intervertebral space identified at L4-5.   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     Electronically Signed   By: Sherian Rein M.D.   On: 10/14/2022 09:47   I, Brooke Wood, personally (independently) visualized and performed the interpretation of the images attached in this note.  Assessment and Plan: 57 y.o. female with right knee pain due to exacerbation of DJD.  She may have a degenerative meniscus tear.  Plan for steroid injection.  If not improved consider advanced imaging such as MRI.  Left lower leg pain.  Differential includes lumbar radiculopathy.  Plan for trial of gabapentin at bedtime as needed.  Her symptoms are intermittent and mild so further evaluation right now is not necessary but if persisting  could consider advanced imaging such as MRI.   PDMP not reviewed this encounter. Orders Placed This Encounter  Procedures   DG Knee AP/LAT W/Sunrise Right    Standing Status:   Future    Number of Occurrences:   1    Standing Expiration Date:   04/10/2023    Order Specific  Question:   Reason for Exam (SYMPTOM  OR DIAGNOSIS REQUIRED)    Answer:   right knee pain    Order Specific Question:   Preferred imaging location?    Answer:   [facility]    Order Specific Question:   Is patient pregnant?    Answer:   No   [place] LIMITED JOINT SPACE STRUCTURES LOW RIGHT(NO LINKED CHARGES)    Order Specific Question:   Reason for Exam (SYMPTOM  OR DIAGNOSIS REQUIRED)    Answer:   right knee pain    Order Specific Question:   Preferred imaging location?    Answer:   [facility] ordered this encounter  Medications   gabapentin (NEURONTIN) 100 MG capsule    Sig: Take 1-3 capsules (100-300 mg total) by mouth at bedtime as needed (nerve pain).    Dispense:  90 capsule    Refill:  3     Discussed warning signs or symptoms. Please see discharge instructions. Patient expresses understanding.   The above documentation has been reviewed and is accurate and complete Brooke Wood, M.D. Acute exacerbation of chronic problems

## 2023-03-17 ENCOUNTER — Encounter: Payer: Self-pay | Admitting: Adult Health

## 2023-03-17 ENCOUNTER — Inpatient Hospital Stay: Payer: BC Managed Care – PPO | Attending: Adult Health | Admitting: Adult Health

## 2023-03-17 VITALS — BP 116/74 | HR 75 | Temp 97.9°F | Resp 14 | Wt 161.2 lb

## 2023-03-17 DIAGNOSIS — Z801 Family history of malignant neoplasm of trachea, bronchus and lung: Secondary | ICD-10-CM | POA: Insufficient documentation

## 2023-03-17 DIAGNOSIS — C50411 Malignant neoplasm of upper-outer quadrant of right female breast: Secondary | ICD-10-CM

## 2023-03-17 DIAGNOSIS — Z853 Personal history of malignant neoplasm of breast: Secondary | ICD-10-CM | POA: Diagnosis present

## 2023-03-17 DIAGNOSIS — K30 Functional dyspepsia: Secondary | ICD-10-CM | POA: Insufficient documentation

## 2023-03-17 DIAGNOSIS — Z170 Estrogen receptor positive status [ER+]: Secondary | ICD-10-CM

## 2023-03-17 DIAGNOSIS — R140 Abdominal distension (gaseous): Secondary | ICD-10-CM | POA: Diagnosis not present

## 2023-03-17 NOTE — Progress Notes (Signed)
[facility] Cancer Follow up:    Brooke Grandchild, MD [address] [place] [address]   DIAGNOSIS:  Cancer Staging  Malignant neoplasm of upper-outer quadrant of right breast in female, estrogen receptor positive (HCC) Staging form: Breast, AJCC 7th Edition - Clinical: Stage IIA (T2, N0, cM0) - Unsigned Specimen type: Core Needle Biopsy Laterality: Right Staging comments: Staged at breast conference 12.11.13  - Pathologic: No stage assigned - Unsigned Specimen type: Core Needle Biopsy Laterality: Right   SUMMARY OF ONCOLOGIC HISTORY: (1) status post right breast biopsy on 03/11/2012 for an invasive ductal carcinoma with tubular features, grade 2, ER positive, PR positive, HER-2/neu negative, Ki-67 20%   (2) status post definitive right lumpectomy and sentinel lymph node biopsy on 04/09/2012. 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 One sentinel node had isolated tumor cells. All margins clean.              (a) in the 2018 calcification her tumor would be stage IB   (3) Oncotype DX performed. Her score was 10, giving her a 7% risk of outside the breast recurrence if her only systemic therapy is tamoxifen for 5 years   (4) She completed radiation therapy in April 2015   (5) She began tamoxifen in May 2014 completing 5 years April 2019   (6) status post hysterectomy with left salpingo-oophorectomy January 22, 2017, with benign pathology  CURRENT THERAPY: Observation  INTERVAL HISTORY:  Discussed the use of AI scribe software for clinical note transcription with the patient, who gave verbal consent to proceed.  Brooke Wood 57 y.o. femalea patient with a history of breast cancer, presents for a follow-up visit. She underwent a bilateral breast mammogram in March 2024, which showed no mammographic evidence of malignancy. Leading up to this mammogram, she experienced  right nipple peeling, itching, and discharge. Despite a negative mammogram, she expressed a desire to consult with Dr. Carolynne Edouard. It appears that this consultation did not occur, however since that time the right breast peeling and itching has resolved.  She has no new breast concerns or changes since that time.   She also reports a recent episode of abdominal discomfort and bloating following a meal at a restaurant. The meal consisted of chicken tenders, fries, and broccoli. She did not experience any vomiting or diarrhea. The discomfort was described as a feeling of tightness in the stomach.  It lasted briefly and then resolved.   She also expressed a desire to increase physical activity and improve dietary habits. She previously enjoyed participating in the lift program and expressed interest in returning to it or attending the [facility] three times a week. She also requested guidance on improving her breakfast choices and overall diet.    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   Trochanteric bursitis of left hip 08/11/2018   Type II diabetes mellitus with manifestations (HCC) 07/02/2018   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   Abnormal electrocardiogram (ECG) (  EKG) 10/08/2017   Hx of adenomatous colonic polyps 03/03/2014   Routine general medical examination at a health care facility 06/03/2013   Hyperlipidemia with target LDL less than 130 11/24/2012   Malignant neoplasm of upper-outer quadrant of right breast in female, estrogen  receptor positive (HCC) 03/06/2012   Goiter 07/26/2009   Essential hypertension, benign 01/19/2007   Allergic rhinitis 01/19/2007    is allergic to aspirin and codeine.  MEDICAL HISTORY: 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   Hx of adenomatous colonic polyps 03/03/2014   repeat 2022   Hx of radiation therapy 06/15/12 - 07/29/12   right rbeast, 50.4 Gy x 28 fx, boost to cumulative dose 60.4 gray   Hypertension    Personal history of radiation therapy 2014   PONV (postoperative nausea and vomiting)    SVD (spontaneous vaginal delivery)    x 1   Vocal cord dysfunction    Neg Methacholine challenge test 2007    SURGICAL HISTORY: Past Surgical History:  Procedure Laterality Date   BREAST LUMPECTOMY Right 2014   BREAST LUMPECTOMY WITH SENTINEL LYMPH NODE BIOPSY  04/09/2012   Procedure: BREAST LUMPECTOMY WITH SENTINEL LYMPH NODE BX;  Surgeon: Robyne Askew, MD;  Location: [facility];  Service: General;  Laterality: Right;   BREAST SURGERY  1985   rt br bx   COLONOSCOPY  03/2014   poylps   COLONOSCOPY WITH PROPOFOL N/A 12/06/2021   Procedure: COLONOSCOPY WITH PROPOFOL;  Surgeon: Iva Boop, MD;  Location: [facility];  Service: Gastroenterology;  Laterality: N/A;   CYSTOSCOPY N/A 01/22/2017   Procedure: CYSTOSCOPY;  Surgeon: Carrington Clamp, MD;  Location: [facility];  Service: Gynecology;  Laterality: N/A;   POLYPECTOMY  12/06/2021   Procedure: POLYPECTOMY;  Surgeon: Iva Boop, MD;  Location: [facility];  Service: Gastroenterology;;   ROBOTIC ASSISTED TOTAL HYSTERECTOMY WITH BILATERAL SALPINGO OOPHERECTOMY Bilateral 01/22/2017   Procedure: ROBOTIC ASSISTED TOTAL HYSTERECTOMY WITH BILATERAL SALPINGECTOMY AND LEFT OOPHORECTOMY;  Surgeon: Carrington Clamp, MD;  Location: [facility];  Service:  Gynecology;  Laterality: Bilateral;    SOCIAL HISTORY: Social History   Socioeconomic History   Marital status: Single    Spouse name: Not on file   Number of children: Not on file   Years of education: Not on file   Highest education level: Not on file  Occupational History    Employer: UNEMPLOYED    Comment: [employer]  Tobacco Use   Smoking status: Never   Smokeless tobacco: Never  Vaping Use   Vaping status: Never Used  Substance and Sexual Activity   Alcohol use: No   Drug use: No   Sexual activity: Not Currently    Birth control/protection: None  Other Topics Concern   Not on file  Social History Narrative   Regular exercise-No   Social Drivers of Health   Financial Resource Strain: Not on file  Food Insecurity: Not on file  Transportation Needs: Not on file  Physical Activity: Not on file  Stress: Not on file  Social Connections: Not on file  Intimate Partner Violence: Not on file    FAMILY HISTORY: Family History  Problem Relation Age of Onset   Lung cancer Father 64       smoker   Cancer Father  lung   Hypertension Other    Alcohol abuse Neg Hx    Diabetes Neg Hx    Early death Neg Hx    Hearing loss Neg Hx    Heart disease Neg Hx    Hyperlipidemia Neg Hx    Stroke Neg Hx    Kidney disease Neg Hx    Colon cancer Neg Hx    Breast cancer Neg Hx    Colon polyps Neg Hx    Esophageal cancer Neg Hx    Rectal cancer Neg Hx    Stomach cancer Neg Hx     Review of Systems  Constitutional:  Negative for appetite change, chills, fatigue, fever and unexpected weight change.  HENT:   Negative for hearing loss, lump/mass and trouble swallowing.   Eyes:  Negative for eye problems and icterus.  Respiratory:  Negative for chest tightness, cough and shortness of breath.   Cardiovascular:  Negative for chest pain, leg swelling and palpitations.  Gastrointestinal:  Negative for abdominal distention, abdominal pain, constipation, diarrhea, nausea  and vomiting.  Endocrine: Negative for hot flashes.  Genitourinary:  Negative for difficulty urinating.   Musculoskeletal:  Negative for arthralgias.  Skin:  Negative for itching and rash.  Neurological:  Negative for dizziness, extremity weakness, headaches and numbness.  Hematological:  Negative for adenopathy. Does not bruise/bleed easily.  Psychiatric/Behavioral:  Negative for depression. The patient is not nervous/anxious.       PHYSICAL EXAMINATION   Onc Performance Status - 03/17/23 0900       KPS SCALE   KPS % SCORE Normal, no compliants, no evidence of disease             Vitals:   03/17/23 0902  BP: 116/74  Pulse: 75  Resp: 14  Temp: 97.9 F (36.6 C)  SpO2: 100%    Physical Exam Constitutional:      General: She is not in acute distress.    Appearance: Normal appearance. She is not toxic-appearing.  HENT:     Head: Normocephalic and atraumatic.     Mouth/Throat:     Mouth: Mucous membranes are moist.     Pharynx: Oropharynx is clear. No oropharyngeal exudate or posterior oropharyngeal erythema.  Eyes:     General: No scleral icterus. Cardiovascular:     Rate and Rhythm: Normal rate and regular rhythm.     Pulses: Normal pulses.     Heart sounds: Normal heart sounds.  Pulmonary:     Effort: Pulmonary effort is normal.     Breath sounds: Normal breath sounds.  Chest:     Comments: Right breast s/p lumpectomy and radiation, no sign of local recurrence, left breast benign Abdominal:     General: Abdomen is flat. Bowel sounds are normal. There is no distension.     Palpations: Abdomen is soft.     Tenderness: There is no abdominal tenderness.  Musculoskeletal:        General: No swelling.     Cervical back: Neck supple.  Lymphadenopathy:     Cervical: No cervical adenopathy.     Upper Body:     Right upper body: No axillary adenopathy.     Left upper body: No axillary adenopathy.  Skin:    General: Skin is warm and dry.     Findings: No rash.   Neurological:     General: No focal deficit present.     Mental Status: She is alert.  Psychiatric:        Mood  and Affect: Mood normal.        Behavior: Behavior normal.       ASSESSMENT and THERAPY PLAN:   Malignant neoplasm of upper-outer quadrant of right breast in female, estrogen receptor positive (HCC) Mordecai Maes is a 57 year old woman with history of stage IIa ER/PR positive breast cancer diagnosed in 2013.  She has completed her treatment for breast cancer and continues on observation alone.  Breast Cancer Follow-up No current issues with breasts. Mammogram in March 2024 was negative. Previous concern about right nipple peeling, itching, and discharge in March 2024, resolved and no new issues. -Continue annual mammograms. -Consider follow-up with Dr. Carolynne Edouard for nipple issue if it recurs.  Diet and Exercise Patient reports indigestion and bloating after eating out. Patient is interested in improving diet and increasing exercise. -Provide patient with whole food, plant-based diet information and recipes. -Encourage patient to gradually incorporate healthier food choices into diet. -Explore options for patient to join a [employer] program for regular exercise.  Follow-up Continue f/u with PCP.  -Schedule annual follow-up appointment. -Encourage patient to contact office with any questions or concerns in the meantime.  All questions were answered. The patient knows to call the clinic with any problems, questions or concerns. We can certainly see the patient much sooner if necessary.  Total encounter time:20 minutes*in face-to-face visit time, chart review, lab review, care coordination, order entry, and documentation of the encounter time.    Lillard Anes, NP 03/17/23 12:16 PM Medical Oncology and Hematology [facility] [address] [place], [place] [address] Tel. [phone]    Fax. [phone]  *Total Encounter Time as defined by the Centers for Medicare  and Medicaid Services includes, in addition to the face-to-face time of a patient visit (documented in the note above) non-face-to-face time: obtaining and reviewing outside history, ordering and reviewing medications, tests or procedures, care coordination (communications with other health care professionals or caregivers) and documentation in the medical record.

## 2023-03-17 NOTE — Assessment & Plan Note (Addendum)
Brooke Wood is a 57 year old woman with history of stage IIa ER/PR positive breast cancer diagnosed in 2013.  She has completed her treatment for breast cancer and continues on observation alone.  Breast Cancer Follow-up No current issues with breasts. Mammogram in March 2024 was negative. Previous concern about right nipple peeling, itching, and discharge in March 2024, resolved and no new issues. -Continue annual mammograms. -Consider follow-up with Dr. Carolynne Edouard for nipple issue if it recurs.  Diet and Exercise Patient reports indigestion and bloating after eating out. Patient is interested in improving diet and increasing exercise. -Provide patient with whole food, plant-based diet information and recipes. -Encourage patient to gradually incorporate healthier food choices into diet. -Explore options for patient to join a [employer] program for regular exercise.  Follow-up Continue f/u with PCP.  -Schedule annual follow-up appointment. -Encourage patient to contact office with any questions or concerns in the meantime.

## 2023-03-18 ENCOUNTER — Encounter: Payer: Self-pay | Admitting: *Deleted

## 2023-03-18 NOTE — Progress Notes (Signed)
Right knee x-ray shows mild arthritis.

## 2023-03-18 NOTE — Progress Notes (Signed)
Called left message with Azucena Fallen,  director of [employer] at [employer]. This RN Navigator is calling in reference to a pt who is interested in joining the [employer]. Left detailed message  and cb number.

## 2023-03-21 ENCOUNTER — Encounter: Payer: Self-pay | Admitting: *Deleted

## 2023-03-21 ENCOUNTER — Telehealth: Payer: Self-pay

## 2023-03-21 NOTE — Telephone Encounter (Signed)
Called [employer] in regards to pt. To see what kind of service they could offer her. LVM for Azucena Fallen to return call back when she gets message.

## 2023-03-21 NOTE — Progress Notes (Signed)
The coordinator of the [facility] program, Kendra Opitz have been emailed with pt's name concerning interest in joining the  Program.

## 2023-04-07 ENCOUNTER — Ambulatory Visit: Payer: BC Managed Care – PPO | Admitting: Internal Medicine

## 2023-04-08 ENCOUNTER — Other Ambulatory Visit: Payer: Self-pay | Admitting: Internal Medicine

## 2023-04-08 DIAGNOSIS — E049 Nontoxic goiter, unspecified: Secondary | ICD-10-CM

## 2023-04-10 ENCOUNTER — Other Ambulatory Visit: Payer: Self-pay | Admitting: Obstetrics and Gynecology

## 2023-04-10 DIAGNOSIS — Z1231 Encounter for screening mammogram for malignant neoplasm of breast: Secondary | ICD-10-CM

## 2023-04-17 ENCOUNTER — Other Ambulatory Visit: Payer: Self-pay | Admitting: Internal Medicine

## 2023-04-17 DIAGNOSIS — I10 Essential (primary) hypertension: Secondary | ICD-10-CM

## 2023-04-17 DIAGNOSIS — E118 Type 2 diabetes mellitus with unspecified complications: Secondary | ICD-10-CM

## 2023-04-17 DIAGNOSIS — E785 Hyperlipidemia, unspecified: Secondary | ICD-10-CM

## 2023-04-17 MED ORDER — ROSUVASTATIN CALCIUM 5 MG PO TABS
5.0000 mg | ORAL_TABLET | Freq: Every day | ORAL | 0 refills | Status: DC
Start: 2023-04-17 — End: 2023-07-16

## 2023-04-17 MED ORDER — OLMESARTAN MEDOXOMIL 20 MG PO TABS
20.0000 mg | ORAL_TABLET | Freq: Every day | ORAL | 0 refills | Status: DC
Start: 2023-04-17 — End: 2023-07-16

## 2023-04-17 NOTE — Telephone Encounter (Signed)
Copied from CRM [id number]. Topic: Clinical - Medication Refill >> Apr 17, 2023  4:23 PM Irine Seal wrote: Most Recent Primary Care Visit:  Provider: Etta Grandchild  Department: [facility]  Visit Type: OFFICE VISIT  Date: 12/30/2022  Medication:  rosuvastatin (CRESTOR) 5 MG tablet  olmesartan (BENICAR) 20 MG tablet   Has the patient contacted their pharmacy? No (Agent: If no, request that the patient contact the pharmacy for the refill. If patient does not wish to contact the pharmacy document the reason why and proceed with request.) (Agent: If yes, when and what did the pharmacy advise?)  Is this the correct pharmacy for this prescription? Yes If no, delete pharmacy and type the correct one.  This is the patient's preferred pharmacy:  [facility] [address] [place] - [address] [place] [address] Phone: [phone] Fax: [phone]   Has the prescription been filled recently? Yes  Is the patient out of the medication? Yes  Has the patient been seen for an appointment in the last year OR does the patient have an upcoming appointment? Yes  Can we respond through MyChart? Yes  Agent: Please be advised that Rx refills may take up to 3 business days. We ask that you follow-up with your pharmacy.

## 2023-04-19 ENCOUNTER — Other Ambulatory Visit: Payer: Self-pay | Admitting: Internal Medicine

## 2023-04-19 DIAGNOSIS — I10 Essential (primary) hypertension: Secondary | ICD-10-CM

## 2023-04-19 DIAGNOSIS — E876 Hypokalemia: Secondary | ICD-10-CM

## 2023-04-23 ENCOUNTER — Encounter: Payer: Self-pay | Admitting: Internal Medicine

## 2023-04-23 ENCOUNTER — Ambulatory Visit (INDEPENDENT_AMBULATORY_CARE_PROVIDER_SITE_OTHER): Payer: BC Managed Care – PPO

## 2023-04-23 ENCOUNTER — Ambulatory Visit: Payer: BC Managed Care – PPO | Admitting: Internal Medicine

## 2023-04-23 VITALS — BP 124/76 | HR 78 | Temp 97.7°F | Resp 16 | Ht 66.0 in | Wt 161.2 lb

## 2023-04-23 DIAGNOSIS — E118 Type 2 diabetes mellitus with unspecified complications: Secondary | ICD-10-CM

## 2023-04-23 DIAGNOSIS — S8991XA Unspecified injury of right lower leg, initial encounter: Secondary | ICD-10-CM | POA: Diagnosis not present

## 2023-04-23 DIAGNOSIS — L309 Dermatitis, unspecified: Secondary | ICD-10-CM

## 2023-04-23 DIAGNOSIS — E041 Nontoxic single thyroid nodule: Secondary | ICD-10-CM

## 2023-04-23 DIAGNOSIS — E785 Hyperlipidemia, unspecified: Secondary | ICD-10-CM

## 2023-04-23 DIAGNOSIS — S8992XA Unspecified injury of left lower leg, initial encounter: Secondary | ICD-10-CM | POA: Insufficient documentation

## 2023-04-23 DIAGNOSIS — E559 Vitamin D deficiency, unspecified: Secondary | ICD-10-CM

## 2023-04-23 DIAGNOSIS — J301 Allergic rhinitis due to pollen: Secondary | ICD-10-CM

## 2023-04-23 DIAGNOSIS — I10 Essential (primary) hypertension: Secondary | ICD-10-CM | POA: Diagnosis not present

## 2023-04-23 MED ORDER — CHOLECALCIFEROL 50 MCG (2000 UT) PO TABS: 1.0000 | ORAL_TABLET | Freq: Every day | ORAL | 0 refills | Status: DC

## 2023-04-23 MED ORDER — LEVOCETIRIZINE DIHYDROCHLORIDE 5 MG PO TABS: 5.0000 mg | ORAL_TABLET | Freq: Every evening | ORAL | 0 refills | Status: DC

## 2023-04-23 MED ORDER — FLUOCINONIDE EMULSIFIED BASE 0.05 % EX CREA: 1.0000 | TOPICAL_CREAM | Freq: Two times a day (BID) | CUTANEOUS | 1 refills | Status: DC

## 2023-04-23 NOTE — Patient Instructions (Signed)
Acute Knee Pain, Adult Many things can cause knee pain. Sometimes, knee pain is sudden (acute). It may be caused by damage, swelling, or irritation of the muscles and tissues that support your knee. Pain may come from: A fall. An injury to the knee from twisting motions. A hit to the knee. Infection. The pain often goes away on its own with time and rest. If the pain does not go away, tests may be done to find out what is causing the pain. These may include: Imaging tests, such as an X-ray, MRI, CT scan, or ultrasound. Joint aspiration. In this test, fluid is removed from the knee and checked. Arthroscopy. In this test, a lighted tube is put in the knee and an image is shown on a screen. A biopsy. In this test, a health care provider will remove a small piece of tissue for testing. Follow these instructions at home: If you have a knee sleeve or brace that can be taken off:  Wear the knee sleeve or brace as told by your provider. Take it off only if your provider says that you can. Check the skin around it every day. Tell your provider if you see problems. Loosen the knee sleeve or brace if your toes tingle, are numb, or turn cold and blue. Keep the knee sleeve or brace clean and dry. Bathing If the knee sleeve or brace is not waterproof: Do not let it get wet. Cover it when you take a bath or shower. Use a cover that does not let any water in. Managing pain, stiffness, and swelling  If told, put ice on the area. If you have a knee sleeve or brace that you can take off, remove it as told. Put ice in a plastic bag. Place a towel between your skin and the bag. Leave the ice on for 20 minutes, 2-3 times a day. If your skin turns bright red, take off the ice right away to prevent skin damage. The risk of damage is higher if you cannot feel pain, heat, or cold. Move your toes often to reduce stiffness and swelling. Raise the injured area above the level of your heart while you are sitting  or lying down. Use a pillow to support your foot as needed. If told, use an elastic bandage to put pressure (compression) on your injured knee. This may control swelling, give support, and help with discomfort. Sleep with a pillow under your knee. Activity Rest your knee. Do not do things that cause pain or make pain worse. Do not stand or walk on your injured knee until you're told it's okay. Use crutches as told. Avoid activities where both feet leave the ground at the same time and put stress on the joints. Avoid running, jumping rope, and doing jumping jacks. Work with a physical therapist to make a safe exercise program if told. Physical therapy helps your knee move better and get stronger. Exercise as told. General instructions Take your medicines only as told by your provider. If you are overweight, work with your provider and an expert in healthy eating, called a dietician, to set goals to lose weight. Being overweight can make your knee hurt more. Do not smoke, vape, or use products with nicotine or tobacco in them. If you need help quitting, talk with your provider. Return to normal activities when you are told. Ask what things are safe for you to do. Watch for any changes in your symptoms. Keep all follow-up visits. Your provider will check  your healing and adjust treatments if needed. Contact a health care provider if: The knee pain does not stop. The knee pain changes or gets worse. You have a fever along with knee pain. Your knee is red or feels warm when you touch it. Your knee gives out or locks up. Get help right away if: Your knee swells and the swelling gets worse. You cannot move your knee. You have very bad knee pain that does not get better with medicine. This information is not intended to replace advice given to you by your health care provider. Make sure you discuss any questions you have with your health care provider. Document Revised: 12/19/2022 Document  Reviewed: 05/13/2022 Elsevier Patient Education  2024 [employer].

## 2023-04-23 NOTE — Progress Notes (Signed)
Subjective:  Patient ID: Brooke Wood, female    DOB: [birth year]  Age: 58 y.o. MRN: [id number]  CC: Hypertension, Diabetes, Hyperlipidemia, and Rash   HPI Brooke Wood presents for f/up -----  Discussed the use of AI scribe software for clinical note transcription with the patient, who gave verbal consent to proceed.  History of Present Illness   The patient, nearing her 1th birthday, presents with a history of a fall five days prior. She reports sustaining injuries to her knee and face, with no loss of consciousness at the time of the incident. The knee injury is of primary concern, with the patient noting persistent swelling and scarring. She has been self-managing the wound with Neosporin and bandages. The facial injury was minor, described as a 'nick' near the eye. The patient has an upcoming eye examination scheduled at an external clinic, the details of which she is uncertain.       Outpatient Medications Prior to Visit  Medication Sig Dispense Refill   olmesartan (BENICAR) 20 MG tablet Take 1 tablet (20 mg total) by mouth daily. 90 tablet 0   rosuvastatin (CRESTOR) 5 MG tablet Take 1 tablet (5 mg total) by mouth daily. 90 tablet 0   spironolactone (ALDACTONE) 25 MG tablet Take 1 tablet by mouth once daily 90 tablet 0   levocetirizine (XYZAL) 5 MG tablet TAKE 1 TABLET BY MOUTH ONCE DAILY IN THE EVENING 90 tablet 0   Cholecalciferol 50 MCG (2000 UT) TABS Take 1 tablet (2,000 Units total) by mouth daily. (Patient not taking: Reported on 04/23/2023) 90 tablet 3   gabapentin (NEURONTIN) 100 MG capsule Take 1-3 capsules (100-300 mg total) by mouth at bedtime as needed (nerve pain). 90 capsule 3   scopolamine (TRANSDERM-SCOP) 1 MG/3DAYS Place 1 patch (1.5 mg total) onto the skin every 3 (three) days. 4 patch 0   triamcinolone cream (KENALOG) 0.5 % Apply 1 Application topically 3 (three) times daily. 90 g 2   No facility-administered medications prior to visit.     ROS Review of Systems  Constitutional: Negative.  Negative for diaphoresis and fatigue.  HENT: Negative.    Respiratory:  Negative for cough, chest tightness, shortness of breath and wheezing.   Cardiovascular:  Negative for chest pain, palpitations and leg swelling.  Gastrointestinal:  Negative for abdominal pain, constipation, diarrhea, nausea and vomiting.  Genitourinary:  Negative for difficulty urinating.  Musculoskeletal:  Positive for arthralgias. Negative for back pain and myalgias.  Skin:  Positive for wound.  Neurological:  Negative for dizziness and weakness.  Hematological:  Negative for adenopathy. Does not bruise/bleed easily.  Psychiatric/Behavioral: Negative.      Objective:  BP 124/76 (BP Location: Left Arm, Patient Position: Sitting, Cuff Size: Normal)   Pulse 78   Temp 97.7 F (36.5 C) (Oral)   Resp 16   Ht 5\' 6"  (1.676 m)   Wt 161 lb 3.2 oz (73.1 kg)   LMP 01/07/2016 (Approximate)   SpO2 99%   BMI 26.02 kg/m   BP Readings from Last 3 Encounters:  04/23/23 124/76  03/17/23 116/74  03/10/23 124/84    Wt Readings from Last 3 Encounters:  04/23/23 161 lb 3.2 oz (73.1 kg)  03/17/23 161 lb 3.2 oz (73.1 kg)  03/10/23 163 lb (73.9 kg)    Physical Exam Vitals reviewed.  Constitutional:      Appearance: Normal appearance.  HENT:     Head: Normocephalic and atraumatic.  Nose: Nose normal.     Mouth/Throat:     Mouth: Mucous membranes are moist.  Eyes:     General: No scleral icterus.    Extraocular Movements: Extraocular movements intact.     Conjunctiva/sclera: Conjunctivae normal.     Pupils: Pupils are equal, round, and reactive to light.  Neck:     Comments: Right T nodule Cardiovascular:     Rate and Rhythm: Normal rate and regular rhythm.     Heart sounds: No murmur heard.    No friction rub. No gallop.  Pulmonary:     Effort: Pulmonary effort is normal.     Breath sounds: No stridor. No wheezing, rhonchi or rales.  Abdominal:      General: Abdomen is flat. There is no distension.     Palpations: There is no mass.     Tenderness: There is no abdominal tenderness. There is no guarding.     Hernia: No hernia is present.  Musculoskeletal:        General: Normal range of motion.     Cervical back: Neck supple.     Right knee: No swelling, deformity, effusion, erythema, ecchymosis, bony tenderness or crepitus. Normal range of motion. No tenderness.     Left knee: No swelling, deformity, effusion, erythema, ecchymosis, bony tenderness or crepitus. Normal range of motion. No tenderness.     Right lower leg: No edema.     Left lower leg: No edema.  Lymphadenopathy:     Cervical: No cervical adenopathy.  Skin:    General: Skin is warm and dry.     Findings: Erythema and rash present.  Neurological:     General: No focal deficit present.     Mental Status: She is alert.  Psychiatric:        Mood and Affect: Mood normal.        Behavior: Behavior normal.     Lab Results  Component Value Date   WBC 5.2 09/30/2022   HGB 13.9 09/30/2022   HCT 43.2 09/30/2022   PLT 217.0 09/30/2022   GLUCOSE 82 09/30/2022   CHOL 168 03/26/2022   TRIG 49.0 03/26/2022   HDL 74.60 03/26/2022   LDLCALC 84 03/26/2022   ALT 19 09/30/2022   AST 17 09/30/2022   NA 140 09/30/2022   K 4.2 09/30/2022   CL 102 09/30/2022   CREATININE 0.67 09/30/2022   BUN 12 09/30/2022   CO2 30 09/30/2022   TSH 1.33 03/26/2022   HGBA1C 6.4 09/30/2022   MICROALBUR <0.7 09/30/2022    CT CARDIAC SCORING ([facility] LOCATIONS ONLY) Result Date: 10/28/2022 CLINICAL DATA:  58 year old African American female with history of chest pain, hypertension, elevated cholesterol and shortness of breath with exertion. * Tracking Code: FCC * EXAM: CT CARDIAC CORONARY ARTERY CALCIUM SCORE TECHNIQUE: Non-contrast imaging through the heart was performed using prospective ECG gating. Image post processing was performed on an independent workstation, allowing for quantitative  analysis of the heart and coronary arteries. Note that this exam targets the heart and the chest was not imaged in its entirety. COMPARISON:  No priors. FINDINGS: CORONARY CALCIUM SCORES: Left Main: 0 LAD: 73 LCx: 33 RCA: 0 Total Agatston Score: 106 MESA database percentile: 93rd AORTA MEASUREMENTS: Ascending Aorta: 3.0 cm Descending Aorta:2.2 cm OTHER FINDINGS: Within the visualized portions of the thorax there are no suspicious appearing pulmonary nodules or masses, there is no acute consolidative airspace disease, no pleural effusions, no pneumothorax and no lymphadenopathy. Visualized portions of the upper abdomen  are unremarkable. Surgical clips in the right axillary region, likely from prior lymph node dissection. There are no aggressive appearing lytic or blastic lesions noted in the visualized portions of the skeleton. IMPRESSION: 1. Patient's total coronary artery calcium score is 106 which is 93rd percentile for patient's of matched age, gender and race/ethnicity. Please note that although the presence of coronary artery calcium documents the presence of coronary artery disease, the severity of this disease and any potential stenosis cannot be assessed on this noncontrast CT examination. Assessment for potential risk factor modification, dietary therapy or pharmacologic therapy may be warranted, if clinically indicated. 2. No significant incidental noncardiac findings are noted. Electronically Signed   By: Trudie Reed M.D.   On: 10/28/2022 10:49   DG Knee Complete 4 Views Left Result Date: 04/23/2023 CLINICAL DATA:  Injury fall EXAM: LEFT KNEE - COMPLETE 4+ VIEW COMPARISON:  None Available. FINDINGS: No fracture or malalignment. The joint spaces are patent. No sizable effusion IMPRESSION: No acute osseous abnormality Electronically Signed   By: Jasmine Pang M.D.   On: 04/23/2023 17:12   DG Knee Complete 4 Views Right Result Date: 04/23/2023 CLINICAL DATA:  Fall and bilateral knee pain. EXAM:  RIGHT KNEE - COMPLETE 4+ VIEW COMPARISON:  None Available. FINDINGS: There is no acute fracture or dislocation. The bones are well mineralized. No significant arthritic changes. No joint effusion. The soft tissues are unremarkable. IMPRESSION: Negative. Electronically Signed   By: Elgie Collard M.D.   On: 04/23/2023 17:12     Assessment & Plan:   Hyperlipidemia with target LDL less than 130 -     TSH; Future  Essential hypertension, benign- Her blood pressure is well-controlled. -     Lipid panel; Future -     Basic metabolic panel; Future -     CBC with Differential/Platelet; Future  Type II diabetes mellitus with manifestations (HCC)- Her blood sugar is adequately well-controlled. -     Lipid panel; Future -     Hemoglobin A1c; Future -     Ambulatory referral to Ophthalmology -     HM Diabetes Foot Exam  Chronic eczema of hand -     Fluocinonide Emulsified Base; Apply 1 Application topically 2 (two) times daily.  Dispense: 60 g; Refill: 1 -     Levocetirizine Dihydrochloride; Take 1 tablet (5 mg total) by mouth every evening.  Dispense: 90 tablet; Refill: 0  Injury of right knee, initial encounter -     DG Knee Complete 4 Views Right; Future  Injury of left knee, initial encounter -     DG Knee Complete 4 Views Left; Future  Vitamin D deficiency -     Cholecalciferol; Take 1 tablet (2,000 Units total) by mouth daily.  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  Dispense: 90 tablet; Refill: 0     Follow-up: Return in about 3 months (around 07/22/2023).  [name], MD

## 2023-04-24 ENCOUNTER — Encounter: Payer: Self-pay | Admitting: Internal Medicine

## 2023-04-24 LAB — CBC WITH DIFFERENTIAL/PLATELET
Basophils Absolute: 0.1 10*3/uL (ref 0.0–0.1)
Basophils Relative: 1 % (ref 0.0–3.0)
Eosinophils Absolute: 0 10*3/uL (ref 0.0–0.7)
Eosinophils Relative: 0.4 % (ref 0.0–5.0)
HCT: 40.8 % (ref 36.0–46.0)
Hemoglobin: 13.3 g/dL (ref 12.0–15.0)
Lymphocytes Relative: 44.1 % (ref 12.0–46.0)
Lymphs Abs: 3.6 10*3/uL (ref 0.7–4.0)
MCHC: 32.7 g/dL (ref 30.0–36.0)
MCV: 90.7 fL (ref 78.0–100.0)
Monocytes Absolute: 0.7 10*3/uL (ref 0.1–1.0)
Monocytes Relative: 8 % (ref 3.0–12.0)
Neutro Abs: 3.8 10*3/uL (ref 1.4–7.7)
Neutrophils Relative %: 46.5 % (ref 43.0–77.0)
Platelets: 232 10*3/uL (ref 150.0–400.0)
RBC: 4.5 Mil/uL (ref 3.87–5.11)
RDW: 14.2 % (ref 11.5–15.5)
WBC: 8.1 10*3/uL (ref 4.0–10.5)

## 2023-04-24 LAB — HEMOGLOBIN A1C: Hgb A1c MFr Bld: 6.9 % — ABNORMAL HIGH (ref 4.6–6.5)

## 2023-04-24 LAB — LIPID PANEL
Cholesterol: 129 mg/dL (ref 0–200)
HDL: 59.9 mg/dL (ref >39.00)
LDL Cholesterol: 59 mg/dL (ref 0–99)
NonHDL: 69.1
Total CHOL/HDL Ratio: 2
Triglycerides: 49 mg/dL (ref 0.0–149.0)
VLDL: 9.8 mg/dL (ref 0.0–40.0)

## 2023-04-24 LAB — BASIC METABOLIC PANEL
BUN: 22 mg/dL (ref 6–23)
CO2: 29 meq/L (ref 19–32)
Calcium: 9.8 mg/dL (ref 8.4–10.5)
Chloride: 103 meq/L (ref 96–112)
Creatinine, Ser: 0.8 mg/dL (ref 0.40–1.20)
GFR: 80.37 mL/min (ref >60.00)
Glucose, Bld: 97 mg/dL (ref 70–99)
Potassium: 4.2 meq/L (ref 3.5–5.1)
Sodium: 140 meq/L (ref 135–145)

## 2023-04-24 LAB — TSH: TSH: 1.08 u[IU]/mL (ref 0.35–5.50)

## 2023-05-01 ENCOUNTER — Ambulatory Visit: Payer: Self-pay | Admitting: Internal Medicine

## 2023-05-01 NOTE — Telephone Encounter (Addendum)
Chief Complaint: rectal bleeding Symptoms: abdominal bloating, mild dizziness Frequency: yesterday Pertinent Negatives: Patient denies vomiting, tarry BM, paleness Disposition: [] ED /[] Urgent Care (no appt availability in office) / [x] Appointment(In office/virtual)/ []  [facility]/ [] Home Care/ [x] Refused Recommended Disposition /[] [facility]/ []  Follow-up with PCP Additional Notes: Patient c/o blood noted in stool. Reports it is mixed in stool and stool color is dark brown and loose. Denies taking any blood thinners or ASA, as well as vomiting, tarry BM. Endorses some dizziness when getting up too quick, as well as abdominal bloating. Attempted to schedule appt, but pt requesting to only see PCP. Pt wants Triager to report to Dr. [name] so that he can see her as an emergency, despite other appt availabilities at office. Triager strongly reinforced disposition and that this triage call will be forwarded to PCP office for review and follow up. Patient verbalized understanding and to call back with worsening symptoms.    Copied from CRM [id number]. Topic: Clinical - Red Word Triage >> May 01, 2023 10:06 AM Kathryne Eriksson wrote: Red Word that prompted transfer to Nurse Triage: Blood & Loose Stool >> May 01, 2023 10:08 AM Kathryne Eriksson wrote: Patient states she's experiencing some loose stool and blood. She is seeing blood in both her stool and when she wipes.  Reason for Disposition  MILD rectal bleeding (more than just a few drops or streaks)  Answer Assessment - Initial Assessment Questions 1. COLOR: "What color is it?" "Is that color in part or all of the stool?"     Bright red on stool, reports stool is dark brown Reports loose like diarrhea 2. ONSET: "When was the unusual color first noted?"     yesterday 3. CAUSE: "Have you eaten any food or taken any medicine of this color?" Note: See listing in Background Information section.      no 4. OTHER SYMPTOMS: "Do you have  any other symptoms?" (e.g., abdomen pain, diarrhea, jaundice, fever).     Diarrhea Reports this happens at work when on her feet Bloated stomach since yesterday - reports top part is hard  Answer Assessment - Initial Assessment Questions 1. APPEARANCE of BLOOD: "What color is it?" "Is it passed separately, on the surface of the stool, or mixed in with the stool?"      Bright red, Mixed in stool 2. AMOUNT: "How much blood was passed?"      Just a little bit 3. FREQUENCY: "How many times has blood been passed with the stools?"      3x  4. ONSET: "When was the blood first seen in the stools?" (Days or weeks)      yesterday 5. DIARRHEA: "Is there also some diarrhea?" If Yes, ask: "How many diarrhea stools in the past 24 hours?"      x2 6. CONSTIPATION: "Do you have constipation?" If Yes, ask: "How bad is it?"     no 7. RECURRENT SYMPTOMS: "Have you had blood in your stools before?" If Yes, ask: "When was the last time?" and "What happened that time?"      Yes, last time was a week ago - resolved on its own 8. BLOOD THINNERS: "Do you take any blood thinners?" (e.g., Coumadin/warfarin, Pradaxa/dabigatran, aspirin)     no 9. OTHER SYMPTOMS: "Do you have any other symptoms?"  (e.g., abdomen pain, vomiting, dizziness, fever)     Abd Bloating, hardness at top Denies vomiting, some dizziness when coming up too fast  Protocols used: Stools - Unusual  Color-A-AH, Rectal Bleeding-A-AH

## 2023-05-01 NOTE — Telephone Encounter (Signed)
Please advise. Patient needs an appointment ?

## 2023-05-02 ENCOUNTER — Ambulatory Visit: Payer: Self-pay | Admitting: Internal Medicine

## 2023-05-02 NOTE — Telephone Encounter (Signed)
Unable to reach patient. LMTRC. Patient needs an appointment asap.

## 2023-05-02 NOTE — Telephone Encounter (Signed)
Patient being seen on Monday and appointment already made

## 2023-05-02 NOTE — Telephone Encounter (Signed)
Called and informed front desk of patient calling back to schedule appt and requesting call back.

## 2023-05-02 NOTE — Telephone Encounter (Signed)
Copied from CRM [id number]. Topic: Appointments - Appointment Scheduling >> May 02, 2023  2:24 PM Irine Seal wrote: Patient was previously triaged 01/29/24 for rectal bleeding, patient had a missed call from CMA to call back and schedule an appt asap, earliest availability is 05/13/23 patient is needing a sooner appt- transferring to nurse triage due to symptoms and decision tree denying scheduled due to red words   Reason for Disposition  Health Information question, no triage required and triager able to answer question    See triage from 05/01/2023 and notes in chart from office when attempt made to reach pt.  Answer Assessment - Initial Assessment Questions 1. REASON FOR CALL or QUESTION: "What is your reason for calling today?" or "How can I best help you?" or "What question do you have that I can help answer?"     Pt was returning call from PCP's CMA to attempt to schedule an appointment ASAP.  Agent transferred call to nurse: next available for PCP [id number] therefore nurse unable to schedule appointment due to being to far out.  Will route information back to office and attempt to call [facility] to inform of need to call patient back.. Patient stated rectal bleeding happened again today and wanted to make CMA aware. Pt also stated she has a mammogram appt on Monday at 8:40 and if the office wants to work her into the schedule after that she will be able to come in.  Please call patient to inform of appointment [phone].  Protocols used: Information Only Call - No Triage-A-AH

## 2023-05-02 NOTE — Telephone Encounter (Signed)
 Patient has been scheduled

## 2023-05-05 ENCOUNTER — Ambulatory Visit
Admission: RE | Admit: 2023-05-05 | Discharge: 2023-05-05 | Disposition: A | Discharge status: Home / Self Care | Payer: BC Managed Care – PPO | Source: Ambulatory Visit | Attending: Obstetrics and Gynecology | Admitting: Obstetrics and Gynecology

## 2023-05-05 ENCOUNTER — Ambulatory Visit: Payer: BC Managed Care – PPO | Admitting: Internal Medicine

## 2023-05-05 VITALS — BP 120/84 | HR 75 | Temp 97.6°F | Ht 66.0 in | Wt 160.0 lb

## 2023-05-05 DIAGNOSIS — I10 Essential (primary) hypertension: Secondary | ICD-10-CM | POA: Diagnosis not present

## 2023-05-05 DIAGNOSIS — Z1231 Encounter for screening mammogram for malignant neoplasm of breast: Secondary | ICD-10-CM

## 2023-05-05 DIAGNOSIS — M79662 Pain in left lower leg: Secondary | ICD-10-CM

## 2023-05-05 DIAGNOSIS — M7989 Other specified soft tissue disorders: Secondary | ICD-10-CM

## 2023-05-05 DIAGNOSIS — K921 Melena: Secondary | ICD-10-CM | POA: Diagnosis not present

## 2023-05-05 DIAGNOSIS — E118 Type 2 diabetes mellitus with unspecified complications: Secondary | ICD-10-CM

## 2023-05-05 MED ORDER — HYDROCORTISONE ACETATE 25 MG RE SUPP: 25.0000 mg | Freq: Two times a day (BID) | RECTAL | 1 refills | Status: AC

## 2023-05-05 NOTE — Progress Notes (Unsigned)
Patient ID: Brooke Wood, female   DOB: [birth year], 58 y.o.   MRN: [id number]        Chief Complaint: follow up rectal bleeding       HPI:  Brooke Wood is a 58 y.o. female here with c/o small volume hematochezia x 3 days now resolved, without pain, fever, and Denies worsening reflux, abd pain, dysphagia, n/v, bowel change.  Last colonoscopy 2023 with conclusion of hematochezia at that point likely due to anorectal irritation only.  Pt denies chest pain, increased sob or doe, wheezing, orthopnea, PND, palpitations, dizziness or syncope, but did have a fall with abrasion to left knee and subsequent left leg swelling and discomfort, no hx of DVT.     Pt denies polydipsia, polyuria, or new focal neuro s/s.    Pt denies fever, wt loss, night sweats, loss of appetite, or other constitutional symptoms          Wt Readings from Last 3 Encounters:  05/05/23 160 lb (72.6 kg)  04/23/23 161 lb 3.2 oz (73.1 kg)  03/17/23 161 lb 3.2 oz (73.1 kg)   BP Readings from Last 3 Encounters:  05/05/23 120/84  04/23/23 124/76  03/17/23 116/74         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   Hx of adenomatous colonic polyps 03/03/2014   repeat 2022   Hx of radiation therapy 06/15/12 - 07/29/12   right rbeast, 50.4 Gy x 28 fx, boost to cumulative dose 60.4 gray   Hypertension    Personal history of radiation therapy 2014   PONV (postoperative nausea and vomiting)    SVD (spontaneous vaginal delivery)    x 1   Vocal cord dysfunction    Neg Methacholine challenge test 2007   Past Surgical History:  Procedure Laterality Date   BREAST LUMPECTOMY Right 2014   BREAST LUMPECTOMY WITH SENTINEL LYMPH NODE BIOPSY  04/09/2012   Procedure: BREAST LUMPECTOMY WITH SENTINEL LYMPH NODE BX;  Surgeon: Robyne Askew, MD;  Location: [facility];   Service: General;  Laterality: Right;   BREAST SURGERY  1985   rt br bx   COLONOSCOPY  03/2014   poylps   COLONOSCOPY WITH PROPOFOL N/A 12/06/2021   Procedure: COLONOSCOPY WITH PROPOFOL;  Surgeon: Iva Boop, MD;  Location: [facility];  Service: Gastroenterology;  Laterality: N/A;   CYSTOSCOPY N/A 01/22/2017   Procedure: CYSTOSCOPY;  Surgeon: Carrington Clamp, MD;  Location: [facility];  Service: Gynecology;  Laterality: N/A;   POLYPECTOMY  12/06/2021   Procedure: POLYPECTOMY;  Surgeon: Iva Boop, MD;  Location: [facility];  Service: Gastroenterology;;   ROBOTIC ASSISTED TOTAL HYSTERECTOMY WITH BILATERAL SALPINGO OOPHERECTOMY Bilateral 01/22/2017   Procedure: ROBOTIC ASSISTED TOTAL HYSTERECTOMY WITH BILATERAL SALPINGECTOMY AND LEFT OOPHORECTOMY;  Surgeon: Carrington Clamp, MD;  Location: [facility];  Service: Gynecology;  Laterality: Bilateral;    reports that she has never smoked. She has never used smokeless tobacco. She reports that she does not drink alcohol and does not use drugs. family history includes Cancer in her father; Hypertension in an other family member; Lung cancer (age of onset: 49) in her father. Allergies  Allergen Reactions   Aspirin Hives    Pt is able to take ibuprofen without problems   Codeine Other (See Comments)    "  confusion"   Current Outpatient Medications on File Prior to Visit  Medication Sig Dispense Refill   Cholecalciferol 50 MCG (2000 UT) TABS Take 1 tablet (2,000 Units total) by mouth daily. 90 tablet 0   fluocinonide-emollient (LIDEX-E) 0.05 % cream Apply 1 Application topically 2 (two) times daily. 60 g 1   levocetirizine (XYZAL) 5 MG tablet Take 1 tablet (5 mg total) by mouth every evening. 90 tablet 0   olmesartan (BENICAR) 20 MG tablet Take 1 tablet (20 mg total) by mouth daily. 90 tablet 0   rosuvastatin (CRESTOR) 5 MG tablet Take 1 tablet (5 mg total) by mouth daily. 90 tablet 0   spironolactone (ALDACTONE) 25 MG tablet Take 1 tablet by  mouth once daily 90 tablet 0   No current facility-administered medications on file prior to visit.        ROS:  All others reviewed and negative.  Objective        PE:  BP 120/84 (BP Location: Left Arm, Patient Position: Sitting, Cuff Size: Large)   Pulse 75   Temp 97.6 F (36.4 C) (Temporal)   Ht 5\' 6"  (1.676 m)   Wt 160 lb (72.6 kg)   LMP 01/07/2016 (Approximate)   SpO2 99%   BMI 25.82 kg/m                 Constitutional: Pt appears in NAD               HENT: Head: NCAT.                Right Ear: External ear normal.                 Left Ear: External ear normal.                Eyes: . Pupils are equal, round, and reactive to light. Conjunctivae and EOM are normal               Nose: without d/c or deformity               Neck: Neck supple. Gross normal ROM               Cardiovascular: Normal rate and regular rhythm.                 Pulmonary/Chest: Effort normal and breath sounds without rales or wheezing.                Abd:  Soft, NT, ND, + BS, no organomegaly               Neurological: Pt is alert. At baseline orientation, motor grossly intact               Skin: Skin is warm. No rashes, no other new lesions, LE edema - 1+ below left knee, mild left calf tender, no cords, neg homans               Psychiatric: Pt behavior is normal without agitation   Micro: none  Cardiac tracings I have personally interpreted today:  none  Pertinent Radiological findings (summarize): none   Lab Results  Component Value Date   WBC 8.1 04/23/2023   HGB 13.3 04/23/2023   HCT 40.8 04/23/2023   PLT 232.0 04/23/2023   GLUCOSE 97 04/23/2023   CHOL 129 04/23/2023   TRIG 49.0 04/23/2023   HDL 59.90 04/23/2023   LDLCALC 59 04/23/2023   ALT 19 09/30/2022  AST 17 09/30/2022   NA 140 04/23/2023   K 4.2 04/23/2023   CL 103 04/23/2023   CREATININE 0.80 04/23/2023   BUN 22 04/23/2023   CO2 29 04/23/2023   TSH 1.08 04/23/2023   HGBA1C 6.9 (H) 04/23/2023   MICROALBUR <0.7  09/30/2022   Assessment/Plan:  Brooke Wood is a 58 y.o. Black or African American [2] female with  has a past medical history of Allergy, Breast cancer (HCC) (2014), Diabetes mellitus without complication (HCC), Difficult airway for intubation (02/19/2019), adenomatous colonic polyps (03/03/2014), radiation therapy (06/15/12 - 07/29/12), Hypertension, Personal history of radiation therapy (2014), PONV (postoperative nausea and vomiting), SVD (spontaneous vaginal delivery), and Vocal cord dysfunction.  Pain and swelling of left lower leg I suspect most liekly post traumatic from fall and abrasion to left anterior knee, but can't r/o dvt - for LLE venous doppler  Hematochezia Small volume, c/w anorectal irritation similar to previous, for anusol hc supp for any recurrence, next due for f/u colonoscopy 2026  Essential hypertension, benign BP Readings from Last 3 Encounters:  05/05/23 120/84  04/23/23 124/76  03/17/23 116/74   Stable, pt to continue medical treatment benicar 20 qd   Type II diabetes mellitus with manifestations (HCC) Lab Results  Component Value Date   HGBA1C 6.9 (H) 04/23/2023   Stable, pt to continue current medical treatment  - diet,wt control  Followup: Return if symptoms worsen or fail to improve.  Oliver Barre, MD 05/06/2023 8:44 PM [facility]

## 2023-05-05 NOTE — Patient Instructions (Signed)
Please take all new medication as prescribed - the suppository only if the bleeding happens again  Please continue all other medications as before, and refills have been done if requested.  Please have the pharmacy call with any other refills you may need.  Please keep your appointments with your specialists as you may have planned  You will be contacted regarding the referral for: left leg venous doppler (to check for blood clot in the calf)

## 2023-05-06 ENCOUNTER — Encounter: Payer: Self-pay | Admitting: Internal Medicine

## 2023-05-06 DIAGNOSIS — K921 Melena: Secondary | ICD-10-CM | POA: Insufficient documentation

## 2023-05-06 DIAGNOSIS — M7989 Other specified soft tissue disorders: Secondary | ICD-10-CM | POA: Insufficient documentation

## 2023-05-06 DIAGNOSIS — M79662 Pain in left lower leg: Secondary | ICD-10-CM | POA: Insufficient documentation

## 2023-05-06 NOTE — Assessment & Plan Note (Signed)
I suspect most liekly post traumatic from fall and abrasion to left anterior knee, but can't r/o dvt - for LLE venous doppler

## 2023-05-06 NOTE — Assessment & Plan Note (Signed)
Small volume, c/w anorectal irritation similar to previous, for anusol hc supp for any recurrence, next due for f/u colonoscopy 2026

## 2023-05-06 NOTE — Assessment & Plan Note (Signed)
BP Readings from Last 3 Encounters:  05/05/23 120/84  04/23/23 124/76  03/17/23 116/74   Stable, pt to continue medical treatment benicar 20 qd

## 2023-05-06 NOTE — Assessment & Plan Note (Signed)
Lab Results  Component Value Date   HGBA1C 6.9 (H) 04/23/2023   Stable, pt to continue current medical treatment  - diet,wt control

## 2023-05-12 ENCOUNTER — Other Ambulatory Visit: Payer: Self-pay

## 2023-05-12 ENCOUNTER — Inpatient Hospital Stay: Payer: BC Managed Care – PPO | Attending: Adult Health

## 2023-05-12 ENCOUNTER — Encounter: Payer: Self-pay | Admitting: Adult Health

## 2023-05-12 ENCOUNTER — Inpatient Hospital Stay: Payer: BC Managed Care – PPO | Attending: Adult Health | Admitting: Adult Health

## 2023-05-12 VITALS — BP 142/96 | HR 77 | Temp 97.9°F | Resp 18 | Ht 66.0 in | Wt 159.0 lb

## 2023-05-12 DIAGNOSIS — C50411 Malignant neoplasm of upper-outer quadrant of right female breast: Secondary | ICD-10-CM

## 2023-05-12 DIAGNOSIS — Z853 Personal history of malignant neoplasm of breast: Secondary | ICD-10-CM | POA: Insufficient documentation

## 2023-05-12 DIAGNOSIS — Z923 Personal history of irradiation: Secondary | ICD-10-CM | POA: Diagnosis not present

## 2023-05-12 DIAGNOSIS — Z170 Estrogen receptor positive status [ER+]: Secondary | ICD-10-CM

## 2023-05-12 DIAGNOSIS — K625 Hemorrhage of anus and rectum: Secondary | ICD-10-CM

## 2023-05-12 DIAGNOSIS — Z90722 Acquired absence of ovaries, bilateral: Secondary | ICD-10-CM | POA: Diagnosis not present

## 2023-05-12 DIAGNOSIS — Z90710 Acquired absence of both cervix and uterus: Secondary | ICD-10-CM | POA: Insufficient documentation

## 2023-05-12 DIAGNOSIS — Z801 Family history of malignant neoplasm of trachea, bronchus and lung: Secondary | ICD-10-CM | POA: Diagnosis not present

## 2023-05-12 LAB — IRON AND IRON BINDING CAPACITY (CC-WL,HP ONLY)
Iron: 117 ug/dL (ref 28–170)
Saturation Ratios: 31 % (ref 10.4–31.8)
TIBC: 377 ug/dL (ref 250–450)
UIBC: 260 ug/dL (ref 148–442)

## 2023-05-12 LAB — RETIC PANEL
Immature Retic Fract: 15.8 % (ref 2.3–15.9)
RBC.: 4.66 MIL/uL (ref 3.87–5.11)
Retic Count, Absolute: 70.4 10*3/uL (ref 19.0–186.0)
Retic Ct Pct: 1.5 % (ref 0.4–3.1)
Reticulocyte Hemoglobin: 33 pg (ref >27.9)

## 2023-05-12 LAB — CBC WITH DIFFERENTIAL (CANCER CENTER ONLY)
Abs Immature Granulocytes: 0 10*3/uL (ref 0.00–0.07)
Basophils Absolute: 0 10*3/uL (ref 0.0–0.1)
Basophils Relative: 0 %
Eosinophils Absolute: 0 10*3/uL (ref 0.0–0.5)
Eosinophils Relative: 0 %
HCT: 41.8 % (ref 36.0–46.0)
Hemoglobin: 13.5 g/dL (ref 12.0–15.0)
Immature Granulocytes: 0 %
Lymphocytes Relative: 37 %
Lymphs Abs: 2.1 10*3/uL (ref 0.7–4.0)
MCH: 29.3 pg (ref 26.0–34.0)
MCHC: 32.3 g/dL (ref 30.0–36.0)
MCV: 90.9 fL (ref 80.0–100.0)
Monocytes Absolute: 0.5 10*3/uL (ref 0.1–1.0)
Monocytes Relative: 10 %
Neutro Abs: 3 10*3/uL (ref 1.7–7.7)
Neutrophils Relative %: 53 %
Platelet Count: 187 10*3/uL (ref 150–400)
RBC: 4.6 MIL/uL (ref 3.87–5.11)
RDW: 13.8 % (ref 11.5–15.5)
WBC Count: 5.7 10*3/uL (ref 4.0–10.5)
nRBC: 0 % (ref 0.0–0.2)

## 2023-05-12 LAB — CMP (CANCER CENTER ONLY)
ALT: 13 U/L (ref 0–44)
AST: 11 U/L — ABNORMAL LOW (ref 15–41)
Albumin: 4.5 g/dL (ref 3.5–5.0)
Alkaline Phosphatase: 140 U/L — ABNORMAL HIGH (ref 38–126)
Anion gap: 5 (ref 5–15)
BUN: 16 mg/dL (ref 6–20)
CO2: 30 mmol/L (ref 22–32)
Calcium: 9.9 mg/dL (ref 8.9–10.3)
Chloride: 103 mmol/L (ref 98–111)
Creatinine: 0.78 mg/dL (ref 0.44–1.00)
GFR, Estimated: >60 mL/min (ref >60)
Glucose, Bld: 111 mg/dL — ABNORMAL HIGH (ref 70–99)
Potassium: 4 mmol/L (ref 3.5–5.1)
Sodium: 138 mmol/L (ref 135–145)
Total Bilirubin: 0.5 mg/dL (ref 0.0–1.2)
Total Protein: 7.2 g/dL (ref 6.5–8.1)

## 2023-05-12 LAB — HM DIABETES EYE EXAM

## 2023-05-12 LAB — FERRITIN: Ferritin: 117 ng/mL (ref 11–307)

## 2023-05-12 NOTE — Progress Notes (Signed)
 [facility] Cancer Follow up:    Brooke Knuckles, MD [address] [place] [address]   DIAGNOSIS:  Cancer Staging  Malignant neoplasm of upper-outer quadrant of right breast in female, estrogen receptor positive (HCC) Staging form: Breast, AJCC 7th Edition - Clinical: Stage IIA (T2, N0, cM0) - Unsigned Specimen type: Core Needle Biopsy Laterality: Right Staging comments: Staged at breast conference 12.11.13  - Pathologic: No stage assigned - Unsigned Specimen type: Core Needle Biopsy Laterality: Right   SUMMARY OF ONCOLOGIC HISTORY: (1) status post right breast biopsy on 03/11/2012 for an invasive ductal carcinoma with tubular features, grade 2, ER positive, PR positive, HER-2/neu negative, Ki-67 20%   (2) status post definitive right lumpectomy and sentinel lymph node biopsy on 04/09/2012. 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 One sentinel node had isolated tumor cells. All margins clean.              (a) in the 2018 calcification her tumor would be stage IB   (3) Oncotype DX performed. Her score was 10, giving her a 7% risk of outside the breast recurrence if her only systemic therapy is tamoxifen  for 5 years   (4) She completed radiation therapy in April 2015   (5) She began tamoxifen  in May 2014 completing 5 years April 2019   (6) status post hysterectomy with left salpingo-oophorectomy January 22, 2017, with benign pathology  CURRENT THERAPY: observation  INTERVAL HISTORY:  Discussed the use of AI scribe software for clinical note transcription with the patient, who gave verbal consent to proceed.  Brooke Wood 58 y.o. female with a history of breast cancer presents with rectal bleeding x 2-3 weeks. She describes the blood as 'running' in the rectal area when she wipes.  She is s/p hysterectomy.   She has noticed a recent weight loss, dropping from 161  to 159 pounds, which she attributes to decreased appetite due to anxiety. She denies any symptoms of hemorrhoids such as pain on sitting. The patient is due for a colonoscopy next year when she turns 59.   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   Motion sickness 09/30/2022   Encounter for general adult medical examination with abnormal findings 03/26/2022   Onychomycosis of toenail 03/16/2020   Bilateral hearing loss 08/16/2019   Carcinoma of breast (HCC) 02/19/2019   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   Trochanteric bursitis of left hip 08/11/2018   Type II diabetes mellitus with manifestations (HCC) 07/02/2018   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   Abnormal electrocardiogram (ECG) (EKG) 10/08/2017   Hx of adenomatous colonic polyps 03/03/2014   Routine general medical examination at a health care facility 06/03/2013   Hyperlipidemia with target LDL less than 130 11/24/2012   Malignant neoplasm of upper-outer quadrant of right breast in female, estrogen receptor positive (HCC) 03/06/2012   Goiter 07/26/2009   Essential hypertension, benign 01/19/2007   Allergic rhinitis 01/19/2007    is allergic to aspirin  and codeine.  MEDICAL HISTORY: Past Medical History:  Diagnosis Date   Allergy    Neg RAST 2007   Breast cancer (  HCC) 2014   right breast   Diabetes mellitus without complication (HCC)    Difficult airway for intubation 02/19/2019   Hx of adenomatous colonic polyps 03/03/2014   repeat 2022   Hx of radiation therapy 06/15/12 - 07/29/12   right rbeast, 50.4 Gy x 28 fx, boost to cumulative dose 60.4 gray   Hypertension    Personal history of radiation therapy 2014   PONV (postoperative nausea and vomiting)    SVD  (spontaneous vaginal delivery)    x 1   Vocal cord dysfunction    Neg Methacholine challenge test 2007    SURGICAL HISTORY: Past Surgical History:  Procedure Laterality Date   BREAST LUMPECTOMY Right 2014   BREAST LUMPECTOMY WITH SENTINEL LYMPH NODE BIOPSY  04/09/2012   Procedure: BREAST LUMPECTOMY WITH SENTINEL LYMPH NODE BX;  Surgeon: Mayme Spearman, MD;  Location: [facility];  Service: General;  Laterality: Right;   BREAST SURGERY  1985   rt br bx   COLONOSCOPY  03/2014   poylps   COLONOSCOPY WITH PROPOFOL  N/A 12/06/2021   Procedure: COLONOSCOPY WITH PROPOFOL ;  Surgeon: Kenney Peacemaker, MD;  Location: [facility];  Service: Gastroenterology;  Laterality: N/A;   CYSTOSCOPY N/A 01/22/2017   Procedure: CYSTOSCOPY;  Surgeon: Matt Song, MD;  Location: [facility];  Service: Gynecology;  Laterality: N/A;   POLYPECTOMY  12/06/2021   Procedure: POLYPECTOMY;  Surgeon: Kenney Peacemaker, MD;  Location: [facility];  Service: Gastroenterology;;   ROBOTIC ASSISTED TOTAL HYSTERECTOMY WITH BILATERAL SALPINGO OOPHERECTOMY Bilateral 01/22/2017   Procedure: ROBOTIC ASSISTED TOTAL HYSTERECTOMY WITH BILATERAL SALPINGECTOMY AND LEFT OOPHORECTOMY;  Surgeon: Matt Song, MD;  Location: [facility];  Service: Gynecology;  Laterality: Bilateral;    SOCIAL HISTORY: Social History   Socioeconomic History   Marital status: Single    Spouse name: Not on file   Number of children: Not on file   Years of education: Not on file   Highest education level: Not on file  Occupational History    Employer: UNEMPLOYED    Comment: [employer]  Tobacco Use   Smoking status: Never   Smokeless tobacco: Never  Vaping Use   Vaping status: Never Used  Substance and Sexual Activity   Alcohol use: No   Drug use: No   Sexual activity: Not Currently    Birth control/protection: None  Other Topics Concern   Not on file  Social History Narrative   Regular exercise-No   Social Drivers of Health    Financial Resource Strain: Not on file  Food Insecurity: Not on file  Transportation Needs: Not on file  Physical Activity: Not on file  Stress: Not on file  Social Connections: Not on file  Intimate Partner Violence: Not on file    FAMILY HISTORY: Family History  Problem Relation Age of Onset   Lung cancer Father 76       smoker   Cancer Father        lung   Hypertension Other    Alcohol abuse Neg Hx    Diabetes Neg Hx    Early death Neg Hx    Hearing loss Neg Hx    Heart disease Neg Hx    Hyperlipidemia Neg Hx    Stroke Neg Hx    Kidney disease Neg Hx    Colon cancer Neg Hx    Breast cancer Neg Hx    Colon polyps Neg Hx    Esophageal cancer Neg Hx    Rectal  cancer Neg Hx    Stomach cancer Neg Hx     Review of Systems  Constitutional:  Negative for appetite change, chills, fatigue, fever and unexpected weight change.  HENT:   Negative for hearing loss, lump/mass, mouth sores and trouble swallowing.   Eyes:  Negative for eye problems and icterus.  Respiratory:  Negative for chest tightness, cough and shortness of breath.   Cardiovascular:  Negative for chest pain, leg swelling and palpitations.  Gastrointestinal:  Positive for blood in stool. Negative for abdominal distention, abdominal pain, constipation, diarrhea, nausea, rectal pain and vomiting.  Endocrine: Negative for hot flashes.  Genitourinary:  Negative for difficulty urinating.   Musculoskeletal:  Negative for arthralgias.  Skin:  Negative for itching and rash.  Neurological:  Negative for dizziness, extremity weakness, headaches and numbness.  Hematological:  Negative for adenopathy. Does not bruise/bleed easily.  Psychiatric/Behavioral:  Negative for depression. The patient is not nervous/anxious.       PHYSICAL EXAMINATION    Vitals:   05/12/23 0846  BP: (!) 142/96  Pulse: 77  Resp: 18  Temp: 97.9 F (36.6 C)  SpO2: 100%    Physical Exam Constitutional:      General: She is not in  acute distress.    Appearance: Normal appearance. She is not toxic-appearing.  HENT:     Head: Normocephalic and atraumatic.     Mouth/Throat:     Mouth: Mucous membranes are moist.     Pharynx: Oropharynx is clear. No oropharyngeal exudate or posterior oropharyngeal erythema.  Eyes:     General: No scleral icterus. Cardiovascular:     Rate and Rhythm: Normal rate and regular rhythm.     Pulses: Normal pulses.     Heart sounds: Normal heart sounds.  Pulmonary:     Effort: Pulmonary effort is normal.     Breath sounds: Normal breath sounds.  Abdominal:     General: Abdomen is flat. Bowel sounds are normal. There is no distension.     Palpations: Abdomen is soft.     Tenderness: There is no abdominal tenderness.  Musculoskeletal:        General: No swelling.     Cervical back: Neck supple.  Lymphadenopathy:     Cervical: No cervical adenopathy.  Skin:    General: Skin is warm and dry.     Findings: No rash.  Neurological:     General: No focal deficit present.     Mental Status: She is alert.  Psychiatric:        Mood and Affect: Mood normal.        Behavior: Behavior normal.     LABORATORY DATA:  CBC    Component Value Date/Time   WBC 5.7 05/12/2023 0911   WBC 8.1 04/23/2023 1634   RBC 4.66 05/12/2023 0912   RBC 4.60 05/12/2023 0911   HGB 13.5 05/12/2023 0911   HGB 14.0 05/16/2016 1005   HCT 41.8 05/12/2023 0911   HCT 42.2 05/16/2016 1005   PLT 187 05/12/2023 0911   PLT 191 05/16/2016 1005   MCV 90.9 05/12/2023 0911   MCV 87.6 05/16/2016 1005   MCH 29.3 05/12/2023 0911   MCHC 32.3 05/12/2023 0911   RDW 13.8 05/12/2023 0911   RDW 13.4 05/16/2016 1005   LYMPHSABS 2.1 05/12/2023 0911   LYMPHSABS 2.1 05/16/2016 1005   MONOABS 0.5 05/12/2023 0911   MONOABS 0.6 05/16/2016 1005   EOSABS 0.0 05/12/2023 0911   EOSABS 0.1 05/16/2016 1005   BASOSABS 0.0  05/12/2023 0911   BASOSABS 0.0 05/16/2016 1005    CMP     Component Value Date/Time   NA 138 05/12/2023  0911   NA 138 05/16/2016 1006   K 4.0 05/12/2023 0911   K 3.4 (L) 05/16/2016 1006   CL 103 05/12/2023 0911   CL 98 07/24/2012 1248   CO2 30 05/12/2023 0911   CO2 27 05/16/2016 1006   GLUCOSE 111 (H) 05/12/2023 0911   GLUCOSE 115 05/16/2016 1006   GLUCOSE 158 (H) 07/24/2012 1248   BUN 16 05/12/2023 0911   BUN 17.3 05/16/2016 1006   CREATININE 0.78 05/12/2023 0911   CREATININE 0.7 05/16/2016 1006   CALCIUM  9.9 05/12/2023 0911   CALCIUM  9.7 05/16/2016 1006   PROT 7.2 05/12/2023 0911   PROT 7.2 05/16/2016 1006   ALBUMIN 4.5 05/12/2023 0911   ALBUMIN 3.8 05/16/2016 1006   AST 11 (L) 05/12/2023 0911   AST 12 05/16/2016 1006   ALT 13 05/12/2023 0911   ALT 15 05/16/2016 1006   ALKPHOS 140 (H) 05/12/2023 0911   ALKPHOS 98 05/16/2016 1006   BILITOT 0.5 05/12/2023 0911   BILITOT 0.25 05/16/2016 1006   GFRNONAA >60 05/12/2023 0911   GFRAA >60 06/12/2017 0946     ASSESSMENT and THERAPY PLAN:   Malignant neoplasm of upper-outer quadrant of right breast in female, estrogen receptor positive (HCC) Lansing Planas is a 58 year old woman with history of stage IIa ER/PR positive breast cancer diagnosed in 2013.  She has completed her treatment for breast cancer and continues on observation alone.  Breast Cancer History No current issues with breasts. Mammogram in March 2024 was negative. -Continue annual mammograms. -RTC as scheduled  Rectal Bleeding Patient reports blood in stool  Patient has a history of hysterectomy. No current pain, nausea, or vomiting.  -Order labs to assess for iron deficiency anemia. -Refer to Gastroenterology for further evaluation and potential colonoscopy. -Advise patient to report findings to Dr. Rochelle Chu at next appointment for further f/u and management.    RTC as scheduled in 03/2024 for long term breast cancer surveillance.   All questions were answered. The patient knows to call the clinic with any problems, questions or concerns. We can certainly see the  patient much sooner if necessary.  Total encounter time:20 minutes*in face-to-face visit time, chart review, lab review, care coordination, order entry, and documentation of the encounter time.  Alwin Baars, NP 05/12/23 11:46 AM Medical Oncology and Hematology [facility] [address] [place], [place] [address] Tel. [phone]    Fax. [phone]  *Total Encounter Time as defined by the Centers for Medicare and Medicaid Services includes, in addition to the face-to-face time of a patient visit (documented in the note above) non-face-to-face time: obtaining and reviewing outside history, ordering and reviewing medications, tests or procedures, care coordination (communications with other health care professionals or caregivers) and documentation in the medical record.

## 2023-05-12 NOTE — Assessment & Plan Note (Signed)
 Brooke Wood is a 58 year old woman with history of stage IIa ER/PR positive breast cancer diagnosed in 2013.  She has completed her treatment for breast cancer and continues on observation alone.  Breast Cancer History No current issues with breasts. Mammogram in March 2024 was negative. -Continue annual mammograms. -RTC as scheduled  Rectal Bleeding Patient reports blood in stool  Patient has a history of hysterectomy. No current pain, nausea, or vomiting.  -Order labs to assess for iron deficiency anemia. -Refer to Gastroenterology for further evaluation and potential colonoscopy. -Advise patient to report findings to Dr. Rochelle Chu at next appointment for further f/u and management.    RTC as scheduled in 03/2024 for long term breast cancer surveillance.

## 2023-05-14 ENCOUNTER — Encounter: Payer: Self-pay | Admitting: Internal Medicine

## 2023-05-14 ENCOUNTER — Ambulatory Visit (INDEPENDENT_AMBULATORY_CARE_PROVIDER_SITE_OTHER): Payer: BC Managed Care – PPO | Admitting: Internal Medicine

## 2023-05-14 VITALS — BP 122/70 | HR 77 | Temp 98.0°F | Resp 16 | Ht 66.0 in | Wt 158.6 lb

## 2023-05-14 DIAGNOSIS — E118 Type 2 diabetes mellitus with unspecified complications: Secondary | ICD-10-CM | POA: Diagnosis not present

## 2023-05-14 DIAGNOSIS — I10 Essential (primary) hypertension: Secondary | ICD-10-CM

## 2023-05-14 NOTE — Patient Instructions (Addendum)

## 2023-05-14 NOTE — Progress Notes (Signed)
Subjective:  Patient ID: Brooke Wood, female    DOB: 02-05-1966  Age: 58 y.o. MRN: [id number]  CC: Hypertension and Diabetes   HPI Brooke Wood presents for f/up ----  Discussed the use of AI scribe software for clinical note transcription with the patient, who gave verbal consent to proceed.  History of Present Illness   Brooke Wood is a 58 year old female who presents for routine follow-up and management of diabetes.  She is concerned about her hemoglobin A1c level, which is 6.9%. She is interested in strategies to lower it, including potential medication options. She has not previously taken metformin. No chest pain, shortness of breath, dizziness, or lightheadedness.  She recently visited the cancer center due to noticing a small amount of blood in her bowel movements, which were also loose. She underwent blood work and other tests, all of which returned normal results.  She recently had an eye exam where a mole was detected at the back of her eye. She is scheduled for a follow-up in April to monitor any changes in the mole. There is no impact on her vision from this finding.       Outpatient Medications Prior to Visit  Medication Sig Dispense Refill   Cholecalciferol 50 MCG (2000 UT) TABS Take 1 tablet (2,000 Units total) by mouth daily. 90 tablet 0   fluocinonide-emollient (LIDEX-E) 0.05 % cream Apply 1 Application topically 2 (two) times daily. 60 g 1   hydrocortisone (ANUSOL-HC) 25 MG suppository Place 1 suppository (25 mg total) rectally every 12 (twelve) hours. 12 suppository 1   levocetirizine (XYZAL) 5 MG tablet Take 1 tablet (5 mg total) by mouth every evening. 90 tablet 0   olmesartan (BENICAR) 20 MG tablet Take 1 tablet (20 mg total) by mouth daily. 90 tablet 0   rosuvastatin (CRESTOR) 5 MG tablet Take 1 tablet (5 mg total) by mouth daily. 90 tablet 0   spironolactone (ALDACTONE) 25 MG tablet Take 1 tablet by mouth once daily 90 tablet 0   No  facility-administered medications prior to visit.    ROS Review of Systems  Constitutional: Negative.  Negative for diaphoresis, fatigue and unexpected weight change.  HENT: Negative.    Eyes: Negative.   Respiratory: Negative.  Negative for cough, chest tightness, shortness of breath and wheezing.   Cardiovascular:  Negative for chest pain, palpitations and leg swelling.  Gastrointestinal:  Negative for abdominal pain, constipation, diarrhea, nausea and vomiting.  Genitourinary: Negative.  Negative for difficulty urinating.  Musculoskeletal:  Negative for arthralgias and myalgias.  Skin:  Positive for wound.  Neurological:  Negative for dizziness and weakness.  Hematological:  Negative for adenopathy. Does not bruise/bleed easily.  Psychiatric/Behavioral: Negative.      Objective:  BP 122/70 (BP Location: Left Arm, Patient Position: Sitting, Cuff Size: Normal)   Pulse 77   Temp 98 F (36.7 C) (Oral)   Resp 16   Ht 5\' 6"  (1.676 m)   Wt 158 lb 9.6 oz (71.9 kg)   LMP 01/07/2016 (Approximate)   SpO2 97%   BMI 25.60 kg/m   BP Readings from Last 3 Encounters:  05/14/23 122/70  05/12/23 (!) 142/96  05/05/23 120/84    Wt Readings from Last 3 Encounters:  05/14/23 158 lb 9.6 oz (71.9 kg)  05/12/23 159 lb (72.1 kg)  05/05/23 160 lb (72.6 kg)    Physical Exam Vitals reviewed.  Constitutional:      Appearance: Normal appearance.  HENT:     Mouth/Throat:     Mouth: Mucous membranes are moist.  Eyes:     General: No scleral icterus.    Conjunctiva/sclera: Conjunctivae normal.  Cardiovascular:     Rate and Rhythm: Normal rate and regular rhythm.     Pulses: Normal pulses.     Heart sounds: No murmur heard.    No friction rub. No gallop.     Comments: EKG- NSR with SA, 77 bpm RBBB - old No LVH  Pulmonary:     Effort: Pulmonary effort is normal.     Breath sounds: No stridor. No wheezing, rhonchi or rales.  Abdominal:     General: Abdomen is flat.     Palpations:  There is no mass.     Tenderness: There is no abdominal tenderness. There is no guarding.     Hernia: No hernia is present.  Musculoskeletal:        General: Normal range of motion.     Right lower leg: No edema.     Left lower leg: No edema.  Skin:    General: Skin is warm and dry.  Neurological:     General: No focal deficit present.     Mental Status: She is alert. Mental status is at baseline.  Psychiatric:        Mood and Affect: Mood normal.        Behavior: Behavior normal.     Lab Results  Component Value Date   WBC 5.7 05/12/2023   HGB 13.5 05/12/2023   HCT 41.8 05/12/2023   PLT 187 05/12/2023   GLUCOSE 111 (H) 05/12/2023   CHOL 129 04/23/2023   TRIG 49.0 04/23/2023   HDL 59.90 04/23/2023   LDLCALC 59 04/23/2023   ALT 13 05/12/2023   AST 11 (L) 05/12/2023   NA 138 05/12/2023   K 4.0 05/12/2023   CL 103 05/12/2023   CREATININE 0.78 05/12/2023   BUN 16 05/12/2023   CO2 30 05/12/2023   TSH 1.08 04/23/2023   HGBA1C 6.9 (H) 04/23/2023   MICROALBUR <0.7 09/30/2022    MM 3D SCREENING MAMMOGRAM BILATERAL BREAST Result Date: 05/06/2023 CLINICAL DATA:  Screening. EXAM: DIGITAL SCREENING BILATERAL MAMMOGRAM WITH TOMOSYNTHESIS AND CAD TECHNIQUE: Bilateral screening digital craniocaudal and mediolateral oblique mammograms were obtained. Bilateral screening digital breast tomosynthesis was performed. The images were evaluated with computer-aided detection. COMPARISON:  Previous exam(s). ACR Breast Density Category c: The breasts are heterogeneously dense, which may obscure small masses. FINDINGS: There are no findings suspicious for malignancy. RIGHT surgical changes again noted. IMPRESSION: No mammographic evidence of malignancy. A result letter of this screening mammogram will be mailed directly to the patient. RECOMMENDATION: Screening mammogram in one year. (Code:SM-B-01Y) BI-RADS CATEGORY  2: Benign. Electronically Signed   By: Harmon Pier M.D.   On: 05/06/2023 17:11     Assessment & Plan:   Essential hypertension, benign- BP is well controlled. EKG is negative for LVH. -     EKG 12-Lead  Type II diabetes mellitus with manifestations (HCC)- She will work on her lifestyle modifications and we'll recheck her A1C in 4-6 weeks.     Follow-up: Return in about 6 months (around 11/11/2023).  [name], MD

## 2023-05-16 ENCOUNTER — Telehealth: Payer: Self-pay | Admitting: Pharmacy Technician

## 2023-05-16 ENCOUNTER — Other Ambulatory Visit (HOSPITAL_COMMUNITY): Payer: Self-pay

## 2023-05-16 NOTE — Telephone Encounter (Signed)
Pharmacy Patient Advocate Encounter   Received notification from CoverMyMeds that prior authorization for Levocetirizine Dihydrochloride 5MG  tabletsis required/requested.   Insurance verification completed.   The patient is insured through [facility] .   Per test claim: PA required; PA submitted to above mentioned insurance via CoverMyMeds Key/confirmation #/EOC [id number] Status is pending

## 2023-05-19 ENCOUNTER — Ambulatory Visit
Admission: RE | Admit: 2023-05-19 | Discharge: 2023-05-19 | Disposition: A | Discharge status: Home / Self Care | Payer: BC Managed Care – PPO | Source: Ambulatory Visit | Attending: Internal Medicine | Admitting: Internal Medicine

## 2023-05-19 DIAGNOSIS — E041 Nontoxic single thyroid nodule: Secondary | ICD-10-CM

## 2023-05-20 NOTE — Telephone Encounter (Signed)
 Pharmacy Patient Advocate Encounter  Received notification from [facility] that Prior Authorization for Levocetirizine Dihydrochloride 5MG  tablets  has been DENIED.  Full denial letter will be uploaded to the media tab. See denial reason below.    PA #/Case ID/Reference #: [id number]

## 2023-05-21 NOTE — Telephone Encounter (Signed)
 Patient has been made aware.

## 2023-05-23 ENCOUNTER — Encounter: Payer: Self-pay | Admitting: Internal Medicine

## 2023-06-02 ENCOUNTER — Encounter: Payer: Self-pay | Admitting: Internal Medicine

## 2023-06-02 ENCOUNTER — Ambulatory Visit (HOSPITAL_COMMUNITY)
Admission: RE | Admit: 2023-06-02 | Discharge: 2023-06-02 | Disposition: A | Discharge status: Home / Self Care | Payer: BC Managed Care – PPO | Source: Ambulatory Visit | Attending: Internal Medicine | Admitting: Internal Medicine

## 2023-06-02 DIAGNOSIS — M7989 Other specified soft tissue disorders: Secondary | ICD-10-CM | POA: Diagnosis not present

## 2023-06-02 DIAGNOSIS — M79662 Pain in left lower leg: Secondary | ICD-10-CM | POA: Diagnosis present

## 2023-06-09 ENCOUNTER — Other Ambulatory Visit: Payer: Self-pay | Admitting: *Deleted

## 2023-06-09 DIAGNOSIS — C50411 Malignant neoplasm of upper-outer quadrant of right female breast: Secondary | ICD-10-CM

## 2023-07-03 ENCOUNTER — Ambulatory Visit: Admitting: Internal Medicine

## 2023-07-11 ENCOUNTER — Other Ambulatory Visit: Payer: Self-pay | Admitting: Internal Medicine

## 2023-07-11 DIAGNOSIS — E785 Hyperlipidemia, unspecified: Secondary | ICD-10-CM

## 2023-07-11 DIAGNOSIS — E876 Hypokalemia: Secondary | ICD-10-CM

## 2023-07-11 DIAGNOSIS — E118 Type 2 diabetes mellitus with unspecified complications: Secondary | ICD-10-CM

## 2023-07-11 DIAGNOSIS — I10 Essential (primary) hypertension: Secondary | ICD-10-CM

## 2023-07-16 ENCOUNTER — Telehealth: Payer: Self-pay | Admitting: Internal Medicine

## 2023-07-16 ENCOUNTER — Other Ambulatory Visit: Payer: Self-pay | Admitting: Internal Medicine

## 2023-07-16 DIAGNOSIS — E118 Type 2 diabetes mellitus with unspecified complications: Secondary | ICD-10-CM

## 2023-07-16 DIAGNOSIS — E785 Hyperlipidemia, unspecified: Secondary | ICD-10-CM

## 2023-07-16 DIAGNOSIS — E876 Hypokalemia: Secondary | ICD-10-CM

## 2023-07-16 DIAGNOSIS — I10 Essential (primary) hypertension: Secondary | ICD-10-CM

## 2023-07-16 MED ORDER — OLMESARTAN MEDOXOMIL 20 MG PO TABS: 20.0000 mg | ORAL_TABLET | Freq: Every day | ORAL | 0 refills | Status: DC

## 2023-07-16 MED ORDER — ROSUVASTATIN CALCIUM 5 MG PO TABS: 5.0000 mg | ORAL_TABLET | Freq: Every day | ORAL | 0 refills | Status: DC

## 2023-07-16 MED ORDER — SPIRONOLACTONE 25 MG PO TABS
25.0000 mg | ORAL_TABLET | Freq: Every day | ORAL | 0 refills | Status: DC
Start: 2023-07-16 — End: 2023-09-04

## 2023-07-16 NOTE — Telephone Encounter (Signed)
 Pt requesting refills of olmesartan 20mg , rosuvastatin 5mg , and spironolactone 25mg . Please follow-up with the patient.  Copied from CRM [id number]. Topic: Clinical - Prescription Issue >> Jul 16, 2023  2:54 PM Danika B wrote: Reason for CRM: Patient states that she submitted 3 refill requests last week but has not received the refills at her pharmacy yet. Patient is irate as she states she cannot go without blood pressure pills and states her age plays a factor, and she is completely out of the meds. States she was given 3 pills to tie her over. She then called pharmacy again today and was told they still haven't received anything. Patient states she is going directly to pharmacy after work around 4:00PM and that something needs to be done by that time.   Patient states she does not know the name of any of her prescriptions and does not have access to see the names anywhere right now. States that the 3 meds begin with an O, an R, and S. She does have pending orders for meds on her list beginning with those letters. Patient callback [phone]  olmesartan (BENICAR) 20 MG tablet  rosuvastatin (CRESTOR) 5 MG tablet spironolactone (ALDACTONE) 25 MG tablet

## 2023-07-17 NOTE — Telephone Encounter (Signed)
 Medication has been refilled.

## 2023-07-25 ENCOUNTER — Other Ambulatory Visit

## 2023-07-25 ENCOUNTER — Encounter: Payer: Self-pay | Admitting: Gastroenterology

## 2023-07-25 ENCOUNTER — Ambulatory Visit (INDEPENDENT_AMBULATORY_CARE_PROVIDER_SITE_OTHER): Admitting: Gastroenterology

## 2023-07-25 VITALS — BP 124/68 | HR 100 | Ht 66.0 in | Wt 160.0 lb

## 2023-07-25 DIAGNOSIS — R140 Abdominal distension (gaseous): Secondary | ICD-10-CM | POA: Diagnosis not present

## 2023-07-25 DIAGNOSIS — R1013 Epigastric pain: Secondary | ICD-10-CM | POA: Diagnosis not present

## 2023-07-25 DIAGNOSIS — R1084 Generalized abdominal pain: Secondary | ICD-10-CM

## 2023-07-25 DIAGNOSIS — K625 Hemorrhage of anus and rectum: Secondary | ICD-10-CM

## 2023-07-25 DIAGNOSIS — K5900 Constipation, unspecified: Secondary | ICD-10-CM | POA: Diagnosis not present

## 2023-07-25 LAB — BASIC METABOLIC PANEL WITH GFR
BUN: 13 mg/dL (ref 6–23)
CO2: 31 meq/L (ref 19–32)
Calcium: 9.6 mg/dL (ref 8.4–10.5)
Chloride: 102 meq/L (ref 96–112)
Creatinine, Ser: 0.68 mg/dL (ref 0.40–1.20)
GFR: 94.83 mL/min (ref >60.00)
Glucose, Bld: 86 mg/dL (ref 70–99)
Potassium: 3.7 meq/L (ref 3.5–5.1)
Sodium: 140 meq/L (ref 135–145)

## 2023-07-25 MED ORDER — HYDROCORTISONE (PERIANAL) 2.5 % EX CREA: 1.0000 | TOPICAL_CREAM | Freq: Every evening | CUTANEOUS | 1 refills | Status: AC

## 2023-07-25 NOTE — Patient Instructions (Signed)
 Your provider has requested that you go to the basement level for lab work before leaving today. Press "B" on the elevator. The lab is located at the first door on the left as you exit the elevator.  We have sent the following medications to your pharmacy for you to pick up at your convenience: Hydrocortisone  cream nightly as needed.   Start Miralax 1 capful daily in 8 ounces of liquid.  You have been scheduled for a CT scan of the abdomen and pelvis at [facility], 1st floor Radiology. You are scheduled on Monday 08/04/23 at 1:30 pm. You should arrive 15 minutes prior to your appointment time for registration.    Please follow the written instructions below on the day of your exam:   1) Do not eat anything after 11:30 am (4 hours prior to your test)   You may take any medications as prescribed with a small amount of water , if necessary. If you take any of the following medications: METFORMIN, GLUCOPHAGE, GLUCOVANCE, AVANDAMET, RIOMET, FORTAMET, ACTOPLUS MET, JANUMET, GLUMETZA or METAGLIP, you MAY be asked to HOLD this medication 48 hours AFTER the exam.   The purpose of you drinking the oral contrast is to aid in the visualization of your intestinal tract. The contrast solution may cause some diarrhea. Depending on your individual set of symptoms, you may also receive an intravenous injection of x-ray contrast/dye. Plan on being at [facility] for 45 minutes or longer, depending on the type of exam you are having performed.   If you have any questions regarding your exam or if you need to reschedule, you may call [facility] at [phone] between the hours of 8:00 am and 5:00 pm, Monday-Friday.

## 2023-07-25 NOTE — Progress Notes (Unsigned)
 07/25/2023 Brooke Wood [id number] 07-19-1965   HISTORY OF PRESENT ILLNESS:  Had this same bleeding back in 11/2021 when she had her colonoscopy and bleeding was thought to be due to anorectal irritation.  Colonoscopy 11/2021:  - One 12 mm polyp in the cecum, removed piecemeal using a cold snare. Resected and retrieved. - The examination was otherwise normal on direct and retroflexion views. - Personal history of colonic polyps. 2016 2 adenomas max 6 mm. - Procedure performed at hospital due to difficult airway. - Intermittent rectal bleeding presumed from anorectal irritation.  A. CECUM, POLYPECTOMY:  Sessile serrated adenoma without cytologic dysplasia   Recall 3 years, 11/2024.   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   Hx of adenomatous colonic polyps 03/03/2014   repeat 2022   Hx of radiation therapy 06/15/12 - 07/29/12   right rbeast, 50.4 Gy x 28 fx, boost to cumulative dose 60.4 gray   Hypertension    Personal history of radiation therapy 2014   PONV (postoperative nausea and vomiting)    SVD (spontaneous vaginal delivery)    x 1   Vocal cord dysfunction    Neg Methacholine challenge test 2007   Past Surgical History:  Procedure Laterality Date   BREAST LUMPECTOMY Right 2014   BREAST LUMPECTOMY WITH SENTINEL LYMPH NODE BIOPSY  04/09/2012   Procedure: BREAST LUMPECTOMY WITH SENTINEL LYMPH NODE BX;  Surgeon: Mayme Spearman, MD;  Location: [facility];  Service: General;  Laterality: Right;   BREAST SURGERY  1985   rt br bx   COLONOSCOPY  03/2014   poylps   COLONOSCOPY WITH PROPOFOL  N/A 12/06/2021   Procedure: COLONOSCOPY WITH PROPOFOL ;  Surgeon: Kenney Peacemaker, MD;  Location: [facility];  Service: Gastroenterology;  Laterality: N/A;   CYSTOSCOPY N/A 01/22/2017   Procedure: CYSTOSCOPY;   Surgeon: Matt Song, MD;  Location: [facility];  Service: Gynecology;  Laterality: N/A;   POLYPECTOMY  12/06/2021   Procedure: POLYPECTOMY;  Surgeon: Kenney Peacemaker, MD;  Location: [facility];  Service: Gastroenterology;;   ROBOTIC ASSISTED TOTAL HYSTERECTOMY WITH BILATERAL SALPINGO OOPHERECTOMY Bilateral 01/22/2017   Procedure: ROBOTIC ASSISTED TOTAL HYSTERECTOMY WITH BILATERAL SALPINGECTOMY AND LEFT OOPHORECTOMY;  Surgeon: Matt Song, MD;  Location: [facility];  Service: Gynecology;  Laterality: Bilateral;    reports that she has never smoked. She has never used smokeless tobacco. She reports that she does not drink alcohol and does not use drugs. family history includes Cancer in her father; Hypertension in an other family member; Lung cancer (age of onset: 69) in her father. Allergies  Allergen Reactions   Aspirin  Hives    Pt is able to take ibuprofen  without problems   Codeine Other (See Comments)    "confusion"      Outpatient Encounter Medications as of 07/25/2023  Medication Sig   olmesartan  (BENICAR ) 20 MG tablet Take 1 tablet (20 mg total) by mouth daily.   rosuvastatin  (CRESTOR ) 5 MG tablet Take 1 tablet (5 mg total) by mouth daily.   spironolactone  (ALDACTONE ) 25 MG tablet Take 1 tablet (25 mg total) by mouth daily.   Cholecalciferol  50 MCG (2000 UT) TABS Take 1 tablet (2,000 Units total) by mouth daily. (Patient not taking: Reported on 07/25/2023)   fluocinonide -emollient (LIDEX -E) 0.05 % cream Apply  1 Application topically 2 (two) times daily. (Patient not taking: Reported on 07/25/2023)   hydrocortisone  (ANUSOL -HC) 25 MG suppository Place 1 suppository (25 mg total) rectally every 12 (twelve) hours. (Patient not taking: Reported on 07/25/2023)   levocetirizine (XYZAL ) 5 MG tablet Take 1 tablet (5 mg total) by mouth every evening. (Patient not taking: Reported on 07/25/2023)   No facility-administered encounter medications on file as of 07/25/2023.    REVIEW OF SYSTEMS  :  All other systems reviewed and negative except where noted in the History of Present Illness.   PHYSICAL EXAM: BP 124/68   Pulse 100   Ht 5\' 6"  (1.676 m)   Wt 160 lb (72.6 kg)   LMP 01/07/2016 (Approximate)   BMI 25.82 kg/m  General: Well developed female in no acute distress Head: Normocephalic and atraumatic Eyes:  Sclerae anicteric, conjunctiva pink. Ears: Normal auditory acuity Lungs: Clear throughout to auscultation; no W/R/R. Heart: Regular rate and rhythm; no M/R/G. Abdomen:  Musculoskeletal: Symmetrical with no gross deformities  Skin: No lesions on visible extremities Neurological: Alert oriented x 4, grossly non-focal Psychological:  Alert and cooperative. Normal mood and affect  ASSESSMENT AND PLAN:    CC:  [name], MD

## 2023-07-28 ENCOUNTER — Other Ambulatory Visit (HOSPITAL_COMMUNITY)

## 2023-07-29 ENCOUNTER — Encounter: Payer: Self-pay | Admitting: Gastroenterology

## 2023-07-29 DIAGNOSIS — R1013 Epigastric pain: Secondary | ICD-10-CM | POA: Insufficient documentation

## 2023-07-29 DIAGNOSIS — K5900 Constipation, unspecified: Secondary | ICD-10-CM | POA: Insufficient documentation

## 2023-07-29 DIAGNOSIS — R1084 Generalized abdominal pain: Secondary | ICD-10-CM | POA: Insufficient documentation

## 2023-07-29 DIAGNOSIS — R140 Abdominal distension (gaseous): Secondary | ICD-10-CM | POA: Insufficient documentation

## 2023-07-30 NOTE — Addendum Note (Signed)
 Addended by: Rosena Bartle D on: 07/30/2023 12:06 PM   Modules accepted: Level of Service

## 2023-08-04 ENCOUNTER — Ambulatory Visit (HOSPITAL_COMMUNITY)

## 2023-08-15 ENCOUNTER — Telehealth: Payer: Self-pay | Admitting: Gastroenterology

## 2023-08-15 NOTE — Telephone Encounter (Signed)
 Patient informed CT scan canceled. Please advise. Please send to Dottie, RN as I'm out of the office next week and she is aware of the situation.   Dottie,   Please reschedule CT scan for a Monday as patient is off on Monday.  Thank you

## 2023-08-15 NOTE — Telephone Encounter (Signed)
 Inbound call from patient stating her insurance will not cover CT scan at [facility] requesting for order to be sent to a center within her network. Stated she is able to scheduled on Monday's requesting a call back. States it is okay to leave detailed voicemail. Please advise, thank you.

## 2023-08-15 NOTE — Telephone Encounter (Signed)
 It appears [facility] is the preferred facility for patients CT.  Contacted [facility] and scheduled CT abd/pelvis. Patient has been scheduled for 09/01/23 at 1220 pm. NPO 4 hours prior and she should pick up oral contrast beforehand.  I have spoken to patient to advise her of this updated appointment as well as time/date/location and prep for procedure. Advised she should pick up contrast within the next several days. She verbalizes understanding.

## 2023-08-18 ENCOUNTER — Ambulatory Visit (HOSPITAL_COMMUNITY)

## 2023-08-26 ENCOUNTER — Other Ambulatory Visit: Payer: Self-pay | Admitting: Internal Medicine

## 2023-08-26 DIAGNOSIS — I10 Essential (primary) hypertension: Secondary | ICD-10-CM

## 2023-08-26 DIAGNOSIS — E785 Hyperlipidemia, unspecified: Secondary | ICD-10-CM

## 2023-08-26 DIAGNOSIS — E118 Type 2 diabetes mellitus with unspecified complications: Secondary | ICD-10-CM

## 2023-08-27 ENCOUNTER — Other Ambulatory Visit: Payer: Self-pay | Admitting: Internal Medicine

## 2023-08-27 DIAGNOSIS — E876 Hypokalemia: Secondary | ICD-10-CM

## 2023-08-27 DIAGNOSIS — I10 Essential (primary) hypertension: Secondary | ICD-10-CM

## 2023-08-27 NOTE — Telephone Encounter (Unsigned)
 Copied from CRM [id number]. Topic: Clinical - Medication Refill >> Aug 27, 2023  9:32 AM Kita Perish H wrote: Medication: spironolactone  (ALDACTONE ) 25 MG tablet  Has the patient contacted their pharmacy? Yes, was supposed to call in with other medications (Agent: If no, request that the patient contact the pharmacy for the refill. If patient does not wish to contact the pharmacy document the reason why and proceed with request.) (Agent: If yes, when and what did the pharmacy advise?)  This is the patient's preferred pharmacy:  [facility] [address] [place] - [address] [place] [address] Phone: [phone] Fax: [phone]  Is this the correct pharmacy for this prescription? Yes If no, delete pharmacy and type the correct one.   Has the prescription been filled recently? No  Is the patient out of the medication? Yes  Has the patient been seen for an appointment in the last year OR does the patient have an upcoming appointment? Yes  Can we respond through MyChart? No  Agent: Please be advised that Rx refills may take up to 3 business days. We ask that you follow-up with your pharmacy.

## 2023-08-28 ENCOUNTER — Other Ambulatory Visit: Payer: Self-pay | Admitting: Internal Medicine

## 2023-08-28 DIAGNOSIS — E785 Hyperlipidemia, unspecified: Secondary | ICD-10-CM

## 2023-08-28 DIAGNOSIS — I10 Essential (primary) hypertension: Secondary | ICD-10-CM

## 2023-08-28 DIAGNOSIS — E118 Type 2 diabetes mellitus with unspecified complications: Secondary | ICD-10-CM

## 2023-08-28 NOTE — Telephone Encounter (Unsigned)
 Copied from CRM [id number]. Topic: Clinical - Medication Refill >> Aug 28, 2023  4:45 PM [name] wrote: Medication: olmesartan  (BENICAR ) 20 MG tablet   rosuvastatin  (CRESTOR ) 5 MG tablet   Has the patient contacted their pharmacy? Yes (Agent: If no, request that the patient contact the pharmacy for the refill. If patient does not wish to contact the pharmacy document the reason why and proceed with request.) (Agent: If yes, when and what did the pharmacy advise?)  This is the patient's preferred pharmacy:  [facility] [address] [place] - [address] [place] [address] Phone: [phone] Fax: [phone]  Is this the correct pharmacy for this prescription? Yes If no, delete pharmacy and type the correct one.   Has the prescription been filled recently? Yes  Is the patient out of the medication? Yes  Has the patient been seen for an appointment in the last year OR does the patient have an upcoming appointment? Yes  Can we respond through MyChart? No  Agent: Please be advised that Rx refills may take up to 3 business days. We ask that you follow-up with your pharmacy.

## 2023-08-29 ENCOUNTER — Other Ambulatory Visit: Payer: Self-pay | Admitting: Internal Medicine

## 2023-08-29 DIAGNOSIS — E876 Hypokalemia: Secondary | ICD-10-CM

## 2023-08-29 DIAGNOSIS — I10 Essential (primary) hypertension: Secondary | ICD-10-CM

## 2023-08-29 MED ORDER — ROSUVASTATIN CALCIUM 5 MG PO TABS: 5.0000 mg | ORAL_TABLET | Freq: Every day | ORAL | 0 refills | Status: DC

## 2023-08-29 MED ORDER — OLMESARTAN MEDOXOMIL 20 MG PO TABS: 20.0000 mg | ORAL_TABLET | Freq: Every day | ORAL | 0 refills | Status: DC

## 2023-08-29 NOTE — Telephone Encounter (Signed)
 Patient is needing a call back regarding her blood pressure meds and cholesterol medication she is needing them filled before the weekend

## 2023-08-30 ENCOUNTER — Other Ambulatory Visit: Payer: Self-pay | Admitting: Internal Medicine

## 2023-08-30 DIAGNOSIS — I10 Essential (primary) hypertension: Secondary | ICD-10-CM

## 2023-08-30 DIAGNOSIS — E876 Hypokalemia: Secondary | ICD-10-CM

## 2023-09-01 ENCOUNTER — Ambulatory Visit
Admission: RE | Admit: 2023-09-01 | Discharge: 2023-09-01 | Disposition: A | Discharge status: Home / Self Care | Source: Ambulatory Visit | Attending: Gastroenterology | Admitting: Gastroenterology

## 2023-09-01 ENCOUNTER — Ambulatory Visit: Payer: Self-pay | Admitting: Gastroenterology

## 2023-09-01 DIAGNOSIS — R140 Abdominal distension (gaseous): Secondary | ICD-10-CM

## 2023-09-01 DIAGNOSIS — R1013 Epigastric pain: Secondary | ICD-10-CM

## 2023-09-01 DIAGNOSIS — R1084 Generalized abdominal pain: Secondary | ICD-10-CM

## 2023-09-01 DIAGNOSIS — K5900 Constipation, unspecified: Secondary | ICD-10-CM

## 2023-09-01 MED ORDER — IOPAMIDOL (ISOVUE-300) INJECTION 61%
100.0000 mL | Freq: Once | INTRAVENOUS | Status: AC | PRN
  Administered 2023-09-01: 100 mL via INTRAVENOUS

## 2023-09-03 ENCOUNTER — Other Ambulatory Visit: Payer: Self-pay | Admitting: Internal Medicine

## 2023-09-03 DIAGNOSIS — I10 Essential (primary) hypertension: Secondary | ICD-10-CM

## 2023-09-03 DIAGNOSIS — E876 Hypokalemia: Secondary | ICD-10-CM

## 2023-09-03 NOTE — Telephone Encounter (Signed)
 Last Fill: 07/16/23  Last OV: 05/14/23 Next OV: 11/10/23  Routing to provider for review/authorization.     Copied from CRM [id number]. Topic: Clinical - Prescription Issue >> Sep 03, 2023  1:26 PM Chuck Crater wrote: Reason for CRM: Patient states that she doesn't have anymore Spironolactone  25 mg and really needs it. She wants to know if she can get 15-30 pills. She stated that she waste her pills on the floor.

## 2023-09-03 NOTE — Telephone Encounter (Signed)
 Attempted to call patient to inform her that these refill requests seemed to be too early. Had to leave a voice message for call back

## 2023-09-04 ENCOUNTER — Telehealth: Payer: Self-pay

## 2023-09-04 ENCOUNTER — Other Ambulatory Visit: Payer: Self-pay

## 2023-09-04 DIAGNOSIS — I10 Essential (primary) hypertension: Secondary | ICD-10-CM

## 2023-09-04 DIAGNOSIS — E876 Hypokalemia: Secondary | ICD-10-CM

## 2023-09-04 MED ORDER — SPIRONOLACTONE 25 MG PO TABS: 25.0000 mg | ORAL_TABLET | Freq: Every day | ORAL | 0 refills | Status: DC

## 2023-09-04 NOTE — Telephone Encounter (Signed)
 Copied from CRM [id number]. Topic: Clinical - Medication Question >> Sep 04, 2023  1:50 PM Baldo Levan wrote: Reason for CRM: Patient called in regarding the Spironolactone  25 mg, that she dropped on the floor, patient states she has been without them now for a few days and needs them today. Please advise patient if more can be sent over.

## 2023-09-04 NOTE — Telephone Encounter (Signed)
 Medication has been refilled.

## 2023-09-15 ENCOUNTER — Other Ambulatory Visit: Payer: Self-pay | Admitting: Internal Medicine

## 2023-09-15 NOTE — Telephone Encounter (Unsigned)
 Copied from CRM [id number]. Topic: Clinical - Medication Refill >> Sep 15, 2023  2:09 PM Brooke Wood wrote: Medication: diazepam  (VALIUM ) 5 MG tablet, scopolamine  (TRANSDERM-SCOP) 1 MG/3DAYS   Has the patient contacted their pharmacy? No, Going on a cruise in 1 week, requests these meds for that reason. states she got them last year as well for a cruise.   This is the patient's preferred pharmacy:  [facility] [address] [place] - [address] [place] [address] Phone: [phone] Fax: [phone]  Is this the correct pharmacy for this prescription? Yes If no, delete pharmacy and type the correct one.   Has the prescription been filled recently? No  Is the patient out of the medication? Yes  Has the patient been seen for an appointment in the last year OR does the patient have an upcoming appointment? Yes  Can we respond through MyChart? Yes  Agent: Please be advised that Rx refills may take up to 3 business days. We ask that you follow-up with your pharmacy.

## 2023-09-16 MED ORDER — SCOPOLAMINE 1 MG/3DAYS TD PT72: 1.0000 | MEDICATED_PATCH | TRANSDERMAL | 0 refills | Status: AC

## 2023-09-16 MED ORDER — DIAZEPAM 5 MG PO TABS: 5.0000 mg | ORAL_TABLET | Freq: Four times a day (QID) | ORAL | 0 refills | Status: AC | PRN

## 2023-09-18 ENCOUNTER — Telehealth: Payer: Self-pay | Admitting: Gastroenterology

## 2023-09-18 NOTE — Telephone Encounter (Signed)
 Inbound call from patient stated that she completed bowl purge today and was wondering when she can eat.  Patient requesting a call back  Please advise  Thank you

## 2023-09-18 NOTE — Telephone Encounter (Signed)
 Patient was instructed to purge for her constipation. She did that earlier today. She has not eaten since yesterday. She has not taken her medications. Encouraged to eat and to take her medications. She will begin daily Miralax tomorrow and titrate to her body's response. Keep her follow up appointment in August as scheduled.

## 2023-10-10 ENCOUNTER — Other Ambulatory Visit: Payer: Self-pay | Admitting: Internal Medicine

## 2023-10-10 DIAGNOSIS — E118 Type 2 diabetes mellitus with unspecified complications: Secondary | ICD-10-CM

## 2023-10-10 DIAGNOSIS — I10 Essential (primary) hypertension: Secondary | ICD-10-CM

## 2023-10-10 DIAGNOSIS — E876 Hypokalemia: Secondary | ICD-10-CM

## 2023-10-16 ENCOUNTER — Telehealth: Payer: Self-pay | Admitting: Internal Medicine

## 2023-10-16 NOTE — Telephone Encounter (Signed)
 Copied from CRM [id number]. Topic: Clinical - Medication Question >> Oct 16, 2023  3:12 PM Chasity T wrote: Reason for CRM: Patient is stating she does not have anymore of the medication spironolactone  (ALDACTONE ) 25 MG tablet and states that she is in need of more. Advised that she is not due for a refill as of yet per note but she is stating she only has one left and needs more. Please contact patient if needed regarding medication.

## 2023-10-16 NOTE — Telephone Encounter (Signed)
   Prescription Request  10/16/2023  LOV: 05/14/2023  What is the name of the medication or equipment? spironolactone  (ALDACTONE ) 25 MG tablet   Have you contacted your pharmacy to request a refill? No   Which pharmacy would you like this sent to?  [facility] [address], [place] - [address] [place] [address] Phone: [phone] Fax: [phone]    Patient notified that their request is being sent to the clinical staff for review and that they should receive a response within 2 business days.   Please advise at Mobile [phone] (mobile)     Pt is aware that this medication was prescribed a 90 day supply and was given in June. She called [facility] to get refill on this medication due to her running out but was told since she is out she will need to set up an appt with her PCP.

## 2023-10-17 ENCOUNTER — Other Ambulatory Visit: Payer: Self-pay

## 2023-10-17 DIAGNOSIS — E876 Hypokalemia: Secondary | ICD-10-CM

## 2023-10-17 DIAGNOSIS — I10 Essential (primary) hypertension: Secondary | ICD-10-CM

## 2023-10-17 MED ORDER — SPIRONOLACTONE 25 MG PO TABS: 25.0000 mg | ORAL_TABLET | Freq: Every day | ORAL | 1 refills | Status: AC

## 2023-10-17 NOTE — Telephone Encounter (Signed)
 Medication refill has been sent

## 2023-10-27 ENCOUNTER — Telehealth: Payer: Self-pay | Admitting: Internal Medicine

## 2023-10-27 ENCOUNTER — Telehealth: Payer: Self-pay

## 2023-10-27 DIAGNOSIS — I10 Essential (primary) hypertension: Secondary | ICD-10-CM

## 2023-10-27 DIAGNOSIS — E118 Type 2 diabetes mellitus with unspecified complications: Secondary | ICD-10-CM

## 2023-10-27 MED ORDER — OLMESARTAN MEDOXOMIL 20 MG PO TABS: 20.0000 mg | ORAL_TABLET | Freq: Every day | ORAL | 0 refills | Status: DC

## 2023-10-27 NOTE — Telephone Encounter (Signed)
 Patient left message regarding experiencing breast problems. Patient called to follow-up on current s/s. Patient reports itching and dryness at nipples. No discharge, redness, swelling, inversion, or bumps noted.  Mallie Combes, PA-C consulted. At this time, recommended that patient try OTC lotion with urea  on site. Reviewed with patient recommended lotions. Patient knows to call office back by Friday to update us  if there is any improvement. If not, an appointment can be scheduled for patient to be evaluated in clinic. Patient aware to call office sooner should she experience any drainage, redness, swelling, inversion, or other acute changes.  Patient verbalized an understanding of the information and confirmed plan.

## 2023-10-27 NOTE — Telephone Encounter (Signed)
 Copied from CRM [id number]. Topic: Clinical - Medication Refill >> Oct 27, 2023  8:10 AM Charlet HERO wrote: Medication: olmesartan  (BENICAR ) 20 MG tablet  Has the patient contacted their pharmacy? No No refills  This is the patient's preferred pharmacy:  [facility] [address] [place] - [address] [place] [address] Phone: [phone] Fax: [phone]  Is this the correct pharmacy for this prescription? Yes If no, delete pharmacy and type the correct one.   Has the prescription been filled recently? Yes  Is the patient out of the medication? Yes  Has the patient been seen for an appointment in the last year OR does the patient have an upcoming appointment? Yes  Can we respond through MyChart? No  Agent: Please be advised that Rx refills may take up to 3 business days. We ask that you follow-up with your pharmacy.

## 2023-10-30 ENCOUNTER — Other Ambulatory Visit: Payer: Self-pay | Admitting: Internal Medicine

## 2023-10-30 DIAGNOSIS — J301 Allergic rhinitis due to pollen: Secondary | ICD-10-CM

## 2023-10-30 DIAGNOSIS — L309 Dermatitis, unspecified: Secondary | ICD-10-CM

## 2023-11-03 NOTE — Telephone Encounter (Signed)
 Last OV 05/14/23 Next OV 11/10/23  Last refill 04/23/23 Qty #90/0

## 2023-11-10 ENCOUNTER — Ambulatory Visit: Payer: BC Managed Care – PPO | Admitting: Internal Medicine

## 2023-11-10 ENCOUNTER — Encounter: Payer: Self-pay | Admitting: Internal Medicine

## 2023-11-10 VITALS — BP 130/78 | HR 85 | Temp 97.8°F | Resp 16 | Ht 66.0 in | Wt 158.0 lb

## 2023-11-10 DIAGNOSIS — I10 Essential (primary) hypertension: Secondary | ICD-10-CM

## 2023-11-10 DIAGNOSIS — Z0001 Encounter for general adult medical examination with abnormal findings: Secondary | ICD-10-CM

## 2023-11-10 DIAGNOSIS — Z0000 Encounter for general adult medical examination without abnormal findings: Secondary | ICD-10-CM | POA: Diagnosis not present

## 2023-11-10 DIAGNOSIS — E118 Type 2 diabetes mellitus with unspecified complications: Secondary | ICD-10-CM | POA: Diagnosis not present

## 2023-11-10 LAB — CBC WITH DIFFERENTIAL/PLATELET
Basophils Absolute: 0 K/uL (ref 0.0–0.1)
Basophils Relative: 0.5 % (ref 0.0–3.0)
Eosinophils Absolute: 0 K/uL (ref 0.0–0.7)
Eosinophils Relative: 0.3 % (ref 0.0–5.0)
HCT: 42.7 % (ref 36.0–46.0)
Hemoglobin: 14 g/dL (ref 12.0–15.0)
Lymphocytes Relative: 43 % (ref 12.0–46.0)
Lymphs Abs: 2.6 K/uL (ref 0.7–4.0)
MCHC: 32.8 g/dL (ref 30.0–36.0)
MCV: 87.9 fl (ref 78.0–100.0)
Monocytes Absolute: 0.5 K/uL (ref 0.1–1.0)
Monocytes Relative: 8.7 % (ref 3.0–12.0)
Neutro Abs: 2.9 K/uL (ref 1.4–7.7)
Neutrophils Relative %: 47.5 % (ref 43.0–77.0)
Platelets: 226 K/uL (ref 150.0–400.0)
RBC: 4.85 Mil/uL (ref 3.87–5.11)
RDW: 13.7 % (ref 11.5–15.5)
WBC: 6.1 K/uL (ref 4.0–10.5)

## 2023-11-10 LAB — URINALYSIS, ROUTINE W REFLEX MICROSCOPIC
Bilirubin Urine: NEGATIVE
Hgb urine dipstick: NEGATIVE
Ketones, ur: NEGATIVE
Leukocytes,Ua: NEGATIVE
Nitrite: NEGATIVE
Specific Gravity, Urine: 1.02 (ref 1.000–1.030)
Total Protein, Urine: NEGATIVE
Urine Glucose: NEGATIVE
Urobilinogen, UA: 0.2 (ref 0.0–1.0)
pH: 6.5 (ref 5.0–8.0)

## 2023-11-10 LAB — BASIC METABOLIC PANEL WITH GFR
BUN: 14 mg/dL (ref 6–23)
CO2: 30 meq/L (ref 19–32)
Calcium: 10 mg/dL (ref 8.4–10.5)
Chloride: 99 meq/L (ref 96–112)
Creatinine, Ser: 0.64 mg/dL (ref 0.40–1.20)
GFR: 96.03 mL/min (ref >60.00)
Glucose, Bld: 83 mg/dL (ref 70–99)
Potassium: 3.9 meq/L (ref 3.5–5.1)
Sodium: 137 meq/L (ref 135–145)

## 2023-11-10 LAB — MICROALBUMIN / CREATININE URINE RATIO
Creatinine,U: 141.5 mg/dL
Microalb Creat Ratio: 5.2 mg/g (ref 0.0–30.0)
Microalb, Ur: 0.7 mg/dL (ref 0.0–1.9)

## 2023-11-10 LAB — HEMOGLOBIN A1C: Hgb A1c MFr Bld: 6.8 % — ABNORMAL HIGH (ref 4.6–6.5)

## 2023-11-10 NOTE — Progress Notes (Signed)
 Subjective:  Patient ID: Brooke Wood, female    DOB: Oct 21, 1965  Age: 58 y.o. MRN: [id number]  CC: Annual Exam, Hypertension, Hyperlipidemia, and Diabetes   HPI [name] presents for a CPX and f/up ----  Discussed the use of AI scribe software for clinical note transcription with the patient, who gave verbal consent to proceed.  History of Present Illness A 58 year old female who presents for a routine follow-up visit.  She feels good overall with no chest pain or shortness of breath. She experiences occasional dizziness when moving too fast.  She has lost two pounds since her last visit. There are no concerns about blood pressure or blood sugar, and she has no nausea or vomiting.  She recently traveled to [place] and the [place] on a cruise for her [age] birthday, which was organized by her son. This was her first time on a ship and a plane, and she enjoyed the experience.  She inquires about the appearance of her ankles, questioning if they look swollen. Her knees feel good.    Outpatient Medications Prior to Visit  Medication Sig Dispense Refill   levocetirizine (XYZAL ) 5 MG tablet TAKE 1 TABLET BY MOUTH ONCE DAILY IN THE EVENING 90 tablet 0   olmesartan  (BENICAR ) 20 MG tablet Take 1 tablet (20 mg total) by mouth daily. 90 tablet 0   rosuvastatin  (CRESTOR ) 5 MG tablet Take 1 tablet (5 mg total) by mouth daily. 90 tablet 0   spironolactone  (ALDACTONE ) 25 MG tablet Take 1 tablet (25 mg total) by mouth daily. 90 tablet 1   Cholecalciferol  50 MCG (2000 UT) TABS Take 1 tablet (2,000 Units total) by mouth daily. (Patient not taking: Reported on 11/10/2023) 90 tablet 0   diazepam  (VALIUM ) 5 MG tablet Take 1 tablet (5 mg total) by mouth every 6 (six) hours as needed for anxiety. (Patient not taking: Reported on 11/10/2023) 30 tablet 0   fluocinonide -emollient (LIDEX -E) 0.05 % cream Apply 1 Application topically 2 (two) times daily. (Patient not taking: Reported on  11/10/2023) 60 g 1   hydrocortisone  (ANUSOL -HC) 2.5 % rectal cream Place 1 Application rectally at bedtime. (Patient not taking: Reported on 11/10/2023) 30 g 1   hydrocortisone  (ANUSOL -HC) 25 MG suppository Place 1 suppository (25 mg total) rectally every 12 (twelve) hours. (Patient not taking: Reported on 11/10/2023) 12 suppository 1   scopolamine  (TRANSDERM-SCOP) 1 MG/3DAYS Place 1 patch (1.5 mg total) onto the skin every 3 (three) days. (Patient not taking: Reported on 11/10/2023) 10 patch 0   No facility-administered medications prior to visit.    ROS Review of Systems  Constitutional: Negative.  Negative for appetite change, chills, diaphoresis, fatigue and fever.  HENT: Negative.    Eyes: Negative.   Respiratory: Negative.  Negative for cough, chest tightness, shortness of breath and wheezing.   Cardiovascular:  Negative for chest pain, palpitations and leg swelling.  Gastrointestinal:  Negative for abdominal pain, constipation, diarrhea, nausea, rectal pain and vomiting.  Genitourinary: Negative.  Negative for difficulty urinating and dysuria.  Musculoskeletal:  Positive for arthralgias. Negative for myalgias.  Skin: Negative.  Negative for color change and pallor.  Neurological:  Negative for dizziness, weakness, light-headedness and numbness.  Hematological:  Negative for adenopathy. Does not bruise/bleed easily.  Psychiatric/Behavioral: Negative.      Objective:  BP 130/78 (BP Location: Left Arm, Patient Position: Sitting, Cuff Size: Normal)   Pulse 85   Temp 97.8 F (36.6 C) (Temporal)   Resp 16  Ht 5' 6 (1.676 m)   Wt 158 lb (71.7 kg)   LMP 01/07/2016 (Approximate)   SpO2 98%   BMI 25.50 kg/m   BP Readings from Last 3 Encounters:  11/10/23 130/78  07/25/23 124/68  05/14/23 122/70    Wt Readings from Last 3 Encounters:  11/10/23 158 lb (71.7 kg)  07/25/23 160 lb (72.6 kg)  05/14/23 158 lb 9.6 oz (71.9 kg)    Physical Exam Vitals reviewed.  Constitutional:       Appearance: Normal appearance.  HENT:     Nose: Nose normal.  Eyes:     General: No scleral icterus.    Conjunctiva/sclera: Conjunctivae normal.  Cardiovascular:     Rate and Rhythm: Normal rate and regular rhythm.     Heart sounds: No murmur heard.    No friction rub. No gallop.  Pulmonary:     Effort: Pulmonary effort is normal.     Breath sounds: No stridor. No wheezing, rhonchi or rales.  Abdominal:     General: Abdomen is flat.     Palpations: There is no mass.     Tenderness: There is no abdominal tenderness. There is no guarding.     Hernia: No hernia is present.  Musculoskeletal:        General: No swelling, tenderness, deformity or signs of injury. Normal range of motion.     Cervical back: Neck supple.     Right lower leg: No edema.     Left lower leg: No edema.  Lymphadenopathy:     Cervical: No cervical adenopathy.  Skin:    General: Skin is warm and dry.  Neurological:     General: No focal deficit present.     Mental Status: She is alert. Mental status is at baseline.  Psychiatric:        Mood and Affect: Mood normal.        Behavior: Behavior normal.     Lab Results  Component Value Date   WBC 6.1 11/10/2023   HGB 14.0 11/10/2023   HCT 42.7 11/10/2023   PLT 226.0 11/10/2023   GLUCOSE 83 11/10/2023   CHOL 129 04/23/2023   TRIG 49.0 04/23/2023   HDL 59.90 04/23/2023   LDLCALC 59 04/23/2023   ALT 13 05/12/2023   AST 11 (L) 05/12/2023   NA 137 11/10/2023   K 3.9 11/10/2023   CL 99 11/10/2023   CREATININE 0.64 11/10/2023   BUN 14 11/10/2023   CO2 30 11/10/2023   TSH 1.08 04/23/2023   HGBA1C 6.8 (H) 11/10/2023   MICROALBUR 0.7 11/10/2023    CT ABDOMEN PELVIS W CONTRAST Result Date: 09/01/2023 CLINICAL DATA:  Epigastric abdominal pain, constipation, bloating. EXAM: CT ABDOMEN AND PELVIS WITH CONTRAST TECHNIQUE: Multidetector CT imaging of the abdomen and pelvis was performed using the standard protocol following bolus administration of  intravenous contrast. RADIATION DOSE REDUCTION: This exam was performed according to the departmental dose-optimization program which includes automated exposure control, adjustment of the mA and/or kV according to patient size and/or use of iterative reconstruction technique. CONTRAST:  100mL ISOVUE -300 IOPAMIDOL  (ISOVUE -300) INJECTION 61% COMPARISON:  CT November 09, 2015 FINDINGS: Lower chest: Patulous distal esophagus. Hepatobiliary: No suspicious hepatic lesion. Gallbladder is unremarkable. No biliary ductal dilation. Pancreas: No pancreatic ductal dilation or evidence of acute inflammation. Spleen: No splenomegaly. Adrenals/Urinary Tract: Bilateral adrenal glands appear normal. No hydronephrosis. Kidneys demonstrate symmetric enhancement. Urinary bladder is unremarkable for degree of distension. Stomach/Bowel: Radiopaque enteric contrast material traverses the rectum. Stomach is  unremarkable for degree of distension. No pathologic dilation of small or large bowel. Colonic stool burden compatible with constipation. No evidence of acute bowel inflammation. Vascular/Lymphatic: For aortic atherosclerosis. Normal caliber abdominal aorta. Smooth IVC contours. The portal, splenic and superior mesenteric veins are patent. No pathologically enlarged abdominal or pelvic lymph nodes. Reproductive: Status post hysterectomy. No adnexal masses. Other: No significant abdominopelvic free fluid. Musculoskeletal: Multilevel degenerative changes spine. No acute osseous abnormality. IMPRESSION: 1. No acute abnormality in the abdomen or pelvis. 2. Colonic stool burden compatible with constipation. 3. Patulous distal esophagus, correlate for symptoms of gastroesophageal reflux. 4.  Aortic Atherosclerosis (ICD10-I70.0). Electronically Signed   By: Reyes Holder M.D.   On: 09/01/2023 14:16    Assessment & Plan:   Encounter for general adult medical examination with abnormal findings --- Exam completed, labs reviewed, vaccines  reviewed, cancer screenings are UTD, pt ed material was given.   Type II diabetes mellitus with manifestations (HCC)- Blood sugar is well controlled. -     Microalbumin / creatinine urine ratio; Future -     Basic metabolic panel with GFR; Future -     Hemoglobin A1c; Future  Essential hypertension, benign- Blood pressure is well controlled. -     Basic metabolic panel with GFR; Future -     CBC with Differential/Platelet; Future -     Urinalysis, Routine w reflex microscopic; Future     Follow-up: Return in about 6 months (around 05/12/2024).  Debby Molt, MD

## 2023-11-10 NOTE — Patient Instructions (Signed)

## 2023-11-11 ENCOUNTER — Ambulatory Visit: Payer: Self-pay | Admitting: Internal Medicine

## 2023-11-24 ENCOUNTER — Telehealth: Payer: Self-pay

## 2023-11-24 NOTE — Telephone Encounter (Signed)
 Copied from CRM #[id number]. Topic: Clinical - Medical Advice >> Nov 24, 2023 10:12 AM Emylou G wrote: Reason for CRM: Patient called.. doesn't know if she needs to be seen or just need a cream.. She said has a bruise under left arm.. itching.SABRA

## 2023-11-26 ENCOUNTER — Ambulatory Visit (HOSPITAL_COMMUNITY)
Admission: EM | Admit: 2023-11-26 | Discharge: 2023-11-26 | Disposition: A | Discharge status: Home / Self Care | Attending: Physician Assistant | Admitting: Physician Assistant

## 2023-11-26 ENCOUNTER — Encounter (HOSPITAL_COMMUNITY): Payer: Self-pay

## 2023-11-26 DIAGNOSIS — B354 Tinea corporis: Secondary | ICD-10-CM

## 2023-11-26 DIAGNOSIS — L282 Other prurigo: Secondary | ICD-10-CM | POA: Diagnosis not present

## 2023-11-26 MED ORDER — CICLOPIROX OLAMINE 0.77 % EX CREA: TOPICAL_CREAM | Freq: Two times a day (BID) | CUTANEOUS | 0 refills | Status: DC

## 2023-11-26 NOTE — Telephone Encounter (Signed)
 Patient is currently on the way to be seen by UC

## 2023-11-26 NOTE — Discharge Instructions (Signed)
 Apply Loprox  twice daily for a minimum of 2 weeks.  Keep the area clean and dry.  If it is not improving within a week please return for reevaluation.  If anything worsens and you have spread of rash, associated pain, bleeding or drainage, fever, nausea, vomiting you need to be seen immediately.

## 2023-11-26 NOTE — ED Triage Notes (Signed)
 Pt c/o discoloration under lt arm since Sunday after coming from the beach. States it itches a little bit. Denies pain. Denies change in products.

## 2023-11-26 NOTE — Telephone Encounter (Signed)
 Unable to reach patient. LMTRC

## 2023-11-26 NOTE — ED Provider Notes (Signed)
 [facility]    CSN: [id number] Arrival date & time: 11/26/23  1457      History   Chief Complaint Chief Complaint  Patient presents with   Rash    HPI Brooke Wood is a 58 y.o. female.   Patient presents today with 5-day history of pruritic rash in her left axilla.  She has noticed some hyperpigmentation in the area causing her to be concerned.  She recently returned from a trip and so wants to make sure there is no bedbugs.  She denies any additional lesions.  She has not been applying any over-the-counter medications for symptom management.  She denies history of dermatological condition including atopic dermatitis or psoriasis.  She denies any fever, nausea, vomiting.  The rash has not spread or changed since onset but has improved slightly.  She denies any changes to personal hygiene products including soaps or detergents though she did sleep on different linens at the hotel.  She does have a history of diabetes but reports her blood sugars are well-controlled.  Denies any fever, nausea, vomiting.    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   Hx of adenomatous colonic polyps 03/03/2014   repeat 2022   Hx of radiation therapy 06/15/12 - 07/29/12   right rbeast, 50.4 Gy x 28 fx, boost to cumulative dose 60.4 gray   Hypertension    Personal history of radiation therapy 2014   PONV (postoperative nausea and vomiting)    SVD (spontaneous vaginal delivery)    x 1   Vocal cord dysfunction    Neg Methacholine challenge test 2007    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   Motion sickness 09/30/2022   Encounter for general adult medical examination with  abnormal findings 03/26/2022   Onychomycosis of toenail 03/16/2020   Bilateral hearing loss 08/16/2019   Carcinoma of breast (HCC) 02/19/2019   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   Trochanteric bursitis of left hip 08/11/2018   Type II diabetes mellitus with manifestations (HCC) 07/02/2018   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   Abnormal electrocardiogram (ECG) (EKG) 10/08/2017   Hx of adenomatous colonic polyps 03/03/2014   Routine general medical examination at a health care facility 06/03/2013   Hyperlipidemia with target LDL less than 130 11/24/2012   Malignant neoplasm of upper-outer quadrant of right breast in female, estrogen receptor positive (HCC) 03/06/2012   Goiter 07/26/2009   Essential hypertension, benign 01/19/2007   Allergic rhinitis 01/19/2007    Past Surgical History:  Procedure Laterality Date   BREAST LUMPECTOMY Right 2014   BREAST LUMPECTOMY WITH SENTINEL LYMPH NODE BIOPSY  04/09/2012   Procedure: BREAST LUMPECTOMY WITH SENTINEL LYMPH NODE BX;  Surgeon: Deward GORMAN Curvin DOUGLAS, MD;  Location: [facility];  Service: General;  Laterality: Right;   BREAST SURGERY  1985   rt br bx   COLONOSCOPY  03/2014   poylps   COLONOSCOPY WITH PROPOFOL  N/A 12/06/2021   Procedure: COLONOSCOPY WITH PROPOFOL ;  Surgeon: Avram Lupita BRAVO, MD;  Location: [facility];  Service: Gastroenterology;  Laterality: N/A;   CYSTOSCOPY N/A 01/22/2017   Procedure: CYSTOSCOPY;  Surgeon: Sarrah,  Rosaline, MD;  Location: [facility];  Service: Gynecology;  Laterality: N/A;   POLYPECTOMY  12/06/2021   Procedure: POLYPECTOMY;  Surgeon: Avram Lupita BRAVO, MD;  Location: [facility];  Service: Gastroenterology;;   ROBOTIC ASSISTED TOTAL HYSTERECTOMY WITH BILATERAL SALPINGO OOPHERECTOMY Bilateral 01/22/2017   Procedure: ROBOTIC ASSISTED TOTAL HYSTERECTOMY WITH BILATERAL SALPINGECTOMY AND LEFT OOPHORECTOMY;  Surgeon: Sarrah Rosaline, MD;  Location: [facility];  Service: Gynecology;  Laterality: Bilateral;    OB  History     Gravida  1   Para  1   Term  1   Preterm      AB      Living  1      SAB      IAB      Ectopic      Multiple      Live Births               Home Medications    Prior to Admission medications   Medication Sig Start Date End Date Taking? Authorizing Provider  ciclopirox  (LOPROX ) 0.77 % cream Apply topically 2 (two) times daily. 11/26/23  Yes Taheera Thomann, Rocky POUR, PA-C  Cholecalciferol  50 MCG (2000 UT) TABS Take 1 tablet (2,000 Units total) by mouth daily. Patient not taking: Reported on 11/10/2023 04/23/23   Joshua Debby CROME, MD  diazepam  (VALIUM ) 5 MG tablet Take 1 tablet (5 mg total) by mouth every 6 (six) hours as needed for anxiety. Patient not taking: Reported on 11/10/2023 09/16/23   Joshua Debby CROME, MD  fluocinonide -emollient (LIDEX -E) 0.05 % cream Apply 1 Application topically 2 (two) times daily. Patient not taking: Reported on 11/10/2023 04/23/23   Joshua Debby CROME, MD  hydrocortisone  (ANUSOL -HC) 2.5 % rectal cream Place 1 Application rectally at bedtime. Patient not taking: Reported on 11/10/2023 07/25/23   Zehr, Jessica D, PA-C  hydrocortisone  (ANUSOL -HC) 25 MG suppository Place 1 suppository (25 mg total) rectally every 12 (twelve) hours. Patient not taking: Reported on 11/10/2023 05/05/23 05/04/24  Norleen Lynwood ORN, MD  levocetirizine (XYZAL ) 5 MG tablet TAKE 1 TABLET BY MOUTH ONCE DAILY IN THE EVENING 11/03/23   Joshua Debby CROME, MD  olmesartan  (BENICAR ) 20 MG tablet Take 1 tablet (20 mg total) by mouth daily. 10/27/23   Joshua Debby CROME, MD  rosuvastatin  (CRESTOR ) 5 MG tablet Take 1 tablet (5 mg total) by mouth daily. 08/29/23   Joshua Debby CROME, MD  scopolamine  (TRANSDERM-SCOP) 1 MG/3DAYS Place 1 patch (1.5 mg total) onto the skin every 3 (three) days. Patient not taking: Reported on 11/10/2023 09/16/23   Joshua Debby CROME, MD  spironolactone  (ALDACTONE ) 25 MG tablet Take 1 tablet (25 mg total) by mouth daily. 10/17/23   Joshua Debby CROME, MD    Family History Family  History  Problem Relation Age of Onset   Lung cancer Father 2       smoker   Cancer Father        lung   Hypertension Other    Alcohol abuse Neg Hx    Diabetes Neg Hx    Early death Neg Hx    Hearing loss Neg Hx    Heart disease Neg Hx    Hyperlipidemia Neg Hx    Stroke Neg Hx    Kidney disease Neg Hx    Colon cancer Neg Hx    Breast cancer Neg Hx    Colon polyps Neg Hx    Esophageal cancer Neg Hx    Rectal cancer Neg Hx    Stomach  cancer Neg Hx     Social History Social History   Tobacco Use   Smoking status: Never   Smokeless tobacco: Never  Vaping Use   Vaping status: Never Used  Substance Use Topics   Alcohol use: No   Drug use: No     Allergies   Aspirin  and Codeine   Review of Systems Review of Systems  Constitutional:  Negative for activity change, appetite change, fatigue and fever.  Gastrointestinal:  Negative for diarrhea, nausea and vomiting.  Musculoskeletal:  Negative for arthralgias and myalgias.  Skin:  Positive for rash.     Physical Exam Triage Vital Signs ED Triage Vitals  Encounter Vitals Group     BP 11/26/23 1633 (!) 155/87     Girls Systolic BP Percentile --      Girls Diastolic BP Percentile --      Boys Systolic BP Percentile --      Boys Diastolic BP Percentile --      Pulse Rate 11/26/23 1633 77     Resp 11/26/23 1633 18     Temp 11/26/23 1633 98.2 F (36.8 C)     Temp Source 11/26/23 1633 Oral     SpO2 11/26/23 1633 96 %     Weight --      Height --      Head Circumference --      Peak Flow --      Pain Score 11/26/23 1634 0     Pain Loc --      Pain Education --      Exclude from Growth Chart --    No data found.  Updated Vital Signs BP (!) 155/87 (BP Location: Left Arm)   Pulse 77   Temp 98.2 F (36.8 C) (Oral)   Resp 18   LMP 01/07/2016 (Approximate)   SpO2 96%   Visual Acuity Right Eye Distance:   Left Eye Distance:   Bilateral Distance:    Right Eye Near:   Left Eye Near:    Bilateral Near:      Physical Exam Vitals reviewed.  Constitutional:      General: She is awake. She is not in acute distress.    Appearance: Normal appearance. She is well-developed. She is not ill-appearing.     Comments: Very pleasant female appears in today to no acute distress sitting comfortably in exam room  HENT:     Head: Normocephalic and atraumatic.  Cardiovascular:     Rate and Rhythm: Normal rate and regular rhythm.     Heart sounds: Normal heart sounds, S1 normal and S2 normal. No murmur heard. Pulmonary:     Effort: Pulmonary effort is normal.     Breath sounds: Normal breath sounds. No wheezing, rhonchi or rales.     Comments: Clear to auscultation bilaterally Skin:    Findings: Rash present. Rash is macular, papular and scaling.     Comments: Maculopapular rash with fine scaling and hyperpigmentation noted left axilla.  No surrounding erythema or streaking.  No bleeding or drainage noted.  Psychiatric:        Behavior: Behavior is cooperative.      UC Treatments / Results  Labs (all labs ordered are listed, but only abnormal results are displayed) Labs Reviewed - No data to display  EKG   Radiology No results found.  Procedures Procedures (including critical care time)  Medications Ordered in UC Medications - No data to display  Initial Impression / Assessment and Plan / UC  Course  I have reviewed the triage vital signs and the nursing notes.  Pertinent labs & imaging results that were available during my care of the patient were reviewed by me and considered in my medical decision making (see chart for details).     Patient is well-appearing, afebrile, nontoxic, nontachycardic.  Given associated pruritus and scale concern for tinea corporis so we will start Loprox  twice daily for minimum of 2 weeks.  Low suspicion for cellulitis given there is no associated warmth, pain, systemic symptoms.  She was encouraged to keep the area clean and use hypoallergenic soaps and  detergents.  We discussed that if her symptoms change and she develops associated pain, fever, nausea, vomiting, spread of rash she should be seen immediately.  Strict return precautions given.  All questions answered to patient satisfaction.  Final Clinical Impressions(s) / UC Diagnoses   Final diagnoses:  Tinea corporis  Pruritic rash     Discharge Instructions      Apply Loprox  twice daily for a minimum of 2 weeks.  Keep the area clean and dry.  If it is not improving within a week please return for reevaluation.  If anything worsens and you have spread of rash, associated pain, bleeding or drainage, fever, nausea, vomiting you need to be seen immediately.     ED Prescriptions     Medication Sig Dispense Auth. Provider   ciclopirox  (LOPROX ) 0.77 % cream Apply topically 2 (two) times daily. 30 g Sydna Brodowski K, PA-C      PDMP not reviewed this encounter.   Sherrell Rocky POUR, PA-C 11/26/23 1707

## 2023-11-26 NOTE — Telephone Encounter (Signed)
 Copied from CRM [id number]. Topic: Clinical - Medical Advice >> Nov 25, 2023  4:45 PM Tiffany S wrote: Reason for CRM: PATIENT STILL HAS NOT RECEIVED CALL FROM MESSAGE SENT OVER YESTERDAY

## 2023-11-27 ENCOUNTER — Other Ambulatory Visit: Payer: Self-pay | Admitting: Internal Medicine

## 2023-11-27 DIAGNOSIS — E785 Hyperlipidemia, unspecified: Secondary | ICD-10-CM

## 2023-11-28 ENCOUNTER — Encounter: Payer: Self-pay | Admitting: Gastroenterology

## 2023-11-28 ENCOUNTER — Ambulatory Visit: Admitting: Gastroenterology

## 2023-11-28 VITALS — BP 114/76 | HR 77 | Ht 66.0 in | Wt 159.1 lb

## 2023-11-28 DIAGNOSIS — K5900 Constipation, unspecified: Secondary | ICD-10-CM

## 2023-11-28 NOTE — Progress Notes (Signed)
 w    11/28/2023 Brooke Wood [id number] [birth year]   HISTORY OF PRESENT ILLNESS: This is a pleasant 58 year old female who is a patient of Dr. Darilyn.  She has past medical history of breast cancer, diabetes, hypertension.  She was seen by me in April for complaints of rectal bleeding, which is a recurrent issue.  She had the same bleeding in September 2023 and had a colonoscopy and the bleeding was thought to be anorectal irritation.  I prescribed hydrocortisone  cream.  She states that she has not really used it because it is only intermittent, not often.  Was also complaining of generalized abdominal discomfort and abdominal bloating.  She had not had any cross-sectional imaging so I ordered a CT scan as below.  That showed colonic stool burden compatible with constipation so we gave her MiraLAX bowel purge and then encouraged her to use MiraLAX daily.  She admits that she did not use the MiraLAX regularly because she is afraid and nervous to use it on a regular basis when she has to work and she also uses the bus system to travel around.  Otherwise she is feeling better at this point overall.  CT scan abdomen and pelvis with contrast 08/2023:  IMPRESSION: 1. No acute abnormality in the abdomen or pelvis. 2. Colonic stool burden compatible with constipation. 3. Patulous distal esophagus, correlate for symptoms of gastroesophageal reflux. 4.  Aortic Atherosclerosis (ICD10-I70.0).  Colonoscopy 11/2021:   - One 12 mm polyp in the cecum, removed piecemeal using a cold snare. Resected and retrieved. - The examination was otherwise normal on direct and retroflexion views. - Personal history of colonic polyps. 2016 2 adenomas max 6 mm. - Procedure performed at hospital due to difficult airway. - Intermittent rectal bleeding presumed from anorectal irritation.   A. CECUM, POLYPECTOMY:  Sessile serrated adenoma without cytologic dysplasia    Recall 3 years, 11/2024.   Past Medical History:   Diagnosis Date   Allergy    Neg RAST 2007   Breast cancer (HCC) 2014   right breast   Diabetes mellitus without complication (HCC)    Difficult airway for intubation 02/19/2019   Hx of adenomatous colonic polyps 03/03/2014   repeat 2022   Hx of radiation therapy 06/15/12 - 07/29/12   right rbeast, 50.4 Gy x 28 fx, boost to cumulative dose 60.4 gray   Hypertension    Personal history of radiation therapy 2014   PONV (postoperative nausea and vomiting)    SVD (spontaneous vaginal delivery)    x 1   Vocal cord dysfunction    Neg Methacholine challenge test 2007   Past Surgical History:  Procedure Laterality Date   BREAST LUMPECTOMY Right 2014   BREAST LUMPECTOMY WITH SENTINEL LYMPH NODE BIOPSY  04/09/2012   Procedure: BREAST LUMPECTOMY WITH SENTINEL LYMPH NODE BX;  Surgeon: Deward GORMAN Curvin DOUGLAS, MD;  Location: [facility];  Service: General;  Laterality: Right;   BREAST SURGERY  1985   rt br bx   COLONOSCOPY  03/2014   poylps   COLONOSCOPY WITH PROPOFOL  N/A 12/06/2021   Procedure: COLONOSCOPY WITH PROPOFOL ;  Surgeon: Avram Lupita BRAVO, MD;  Location: [facility];  Service: Gastroenterology;  Laterality: N/A;   CYSTOSCOPY N/A 01/22/2017   Procedure: CYSTOSCOPY;  Surgeon: Sarrah Browning, MD;  Location: [facility];  Service: Gynecology;  Laterality: N/A;   POLYPECTOMY  12/06/2021   Procedure: POLYPECTOMY;  Surgeon: Avram Lupita BRAVO, MD;  Location: [facility];  Service: Gastroenterology;;   ROBOTIC  ASSISTED TOTAL HYSTERECTOMY WITH BILATERAL SALPINGO OOPHERECTOMY Bilateral 01/22/2017   Procedure: ROBOTIC ASSISTED TOTAL HYSTERECTOMY WITH BILATERAL SALPINGECTOMY AND LEFT OOPHORECTOMY;  Surgeon: Sarrah Browning, MD;  Location: [facility];  Service: Gynecology;  Laterality: Bilateral;    reports that she has never smoked. She has never used smokeless tobacco. She reports that she does not drink alcohol and does not use drugs. family history includes Cancer in her father; Hypertension  in an other family member; Lung cancer (age of onset: 35) in her father. Allergies  Allergen Reactions   Aspirin  Hives    Pt is able to take ibuprofen  without problems   Codeine Other (See Comments)    confusion      Outpatient Encounter Medications as of 11/28/2023  Medication Sig   Cholecalciferol  50 MCG (2000 UT) TABS Take 1 tablet (2,000 Units total) by mouth daily.   ciclopirox  (LOPROX ) 0.77 % cream Apply topically 2 (two) times daily.   fluocinonide -emollient (LIDEX -E) 0.05 % cream Apply 1 Application topically 2 (two) times daily.   levocetirizine (XYZAL ) 5 MG tablet TAKE 1 TABLET BY MOUTH ONCE DAILY IN THE EVENING   olmesartan  (BENICAR ) 20 MG tablet Take 1 tablet (20 mg total) by mouth daily.   rosuvastatin  (CRESTOR ) 5 MG tablet Take 1 tablet (5 mg total) by mouth daily.   scopolamine  (TRANSDERM-SCOP) 1 MG/3DAYS Place 1 patch (1.5 mg total) onto the skin every 3 (three) days.   spironolactone  (ALDACTONE ) 25 MG tablet Take 1 tablet (25 mg total) by mouth daily.   diazepam  (VALIUM ) 5 MG tablet Take 1 tablet (5 mg total) by mouth every 6 (six) hours as needed for anxiety. (Patient not taking: Reported on 11/28/2023)   hydrocortisone  (ANUSOL -HC) 2.5 % rectal cream Place 1 Application rectally at bedtime. (Patient not taking: Reported on 11/28/2023)   hydrocortisone  (ANUSOL -HC) 25 MG suppository Place 1 suppository (25 mg total) rectally every 12 (twelve) hours. (Patient not taking: Reported on 11/28/2023)   No facility-administered encounter medications on file as of 11/28/2023.    REVIEW OF SYSTEMS  : All other systems reviewed and negative except where noted in the History of Present Illness.   PHYSICAL EXAM: BP 114/76 (BP Location: Left Arm, Patient Position: Sitting, Cuff Size: Normal)   Pulse 77   Ht 5' 6 (1.676 m)   Wt 159 lb 2 oz (72.2 kg)   LMP 01/07/2016 (Approximate)   BMI 25.68 kg/m  General: Well developed AA female in no acute distress Head: Normocephalic and  atraumatic Eyes:  Sclerae anicteric, conjunctiva pink. Ears: Normal auditory acuity Musculoskeletal: Symmetrical with no gross deformities  Skin: No lesions on visible extremities Neurological: Alert oriented x 4, grossly non-focal Psychological:  Alert and cooperative. Normal mood and affect  ASSESSMENT AND PLAN: *Generalized abdominal pain and bloating: CT scan was performed and showed stool burden throughout the colon.  She was given a MiraLAX bowel purge and then told to continue MiraLAX daily.  She did not continue the MiraLAX because she is afraid to use it regularly when she has to go to work and her transportation is the bus system.  Advised that once daily dosing should not give her diarrhea, but if she would even like to try just a half dose daily to keep things moving and then titrate if needed. *Rectal bleeding: Has had the same complaint for couple of years including when she had her last colonoscopy in September 2023.  This is thought to be due to anorectal irritation.  I prescribed hydrocortisone , but  she has not used it as she says occurs very infrequently.   CC:  Joshua Debby CROME, MD

## 2023-11-28 NOTE — Patient Instructions (Signed)
 Start Miralax 1 capful daily in 8 ounces of liquid, titrate dose as needed.   Follow up in 1 year for colonoscopy or sooner if needed.   _______________________________________________________  If your blood pressure at your visit was 140/90 or greater, please contact your primary care physician to follow up on this.  _______________________________________________________  If you are age 60 or older, your body mass index should be between 23-30. Your Body mass index is 25.68 kg/m. If this is out of the aforementioned range listed, please consider follow up with your Primary Care Provider.  If you are age 87 or younger, your body mass index should be between 19-25. Your Body mass index is 25.68 kg/m. If this is out of the aformentioned range listed, please consider follow up with your Primary Care Provider.   ________________________________________________________  The [facility] providers would like to encourage you to use MYCHART to communicate with providers for non-urgent requests or questions.  Due to long hold times on the telephone, sending your provider a message by [facility] may be a faster and more efficient way to get a response.  Please allow 48 business hours for a response.  Please remember that this is for non-urgent requests.  _______________________________________________________  [facility] is using a team-based approach to care.  Your team is made up of your doctor and two to three APPS. Our APPS (Nurse Practitioners and Physician Assistants) work with your physician to ensure care continuity for you. They are fully qualified to address your health concerns and develop a treatment plan. They communicate directly with your gastroenterologist to care for you. Seeing the Advanced Practice Practitioners on your physician's team can help you by facilitating care more promptly, often allowing for earlier appointments, access to diagnostic testing, procedures, and other  specialty referrals.

## 2023-12-18 ENCOUNTER — Telehealth: Payer: Self-pay | Admitting: *Deleted

## 2023-12-18 NOTE — Telephone Encounter (Signed)
 Pt called stating that she is having issue in her axillary and has redness and pain. She recently went to the ED and cream was prescribed but there has been no resolve. Awaiting callback from pt for further information and scheduling

## 2023-12-19 ENCOUNTER — Encounter: Payer: Self-pay | Admitting: Adult Health

## 2023-12-19 ENCOUNTER — Inpatient Hospital Stay: Attending: Adult Health | Admitting: Adult Health

## 2023-12-19 VITALS — BP 114/86 | HR 109 | Temp 97.7°F | Resp 18 | Ht 66.0 in | Wt 160.5 lb

## 2023-12-19 DIAGNOSIS — Z90722 Acquired absence of ovaries, bilateral: Secondary | ICD-10-CM | POA: Diagnosis not present

## 2023-12-19 DIAGNOSIS — C50411 Malignant neoplasm of upper-outer quadrant of right female breast: Secondary | ICD-10-CM | POA: Diagnosis not present

## 2023-12-19 DIAGNOSIS — Z90710 Acquired absence of both cervix and uterus: Secondary | ICD-10-CM | POA: Insufficient documentation

## 2023-12-19 DIAGNOSIS — Z923 Personal history of irradiation: Secondary | ICD-10-CM | POA: Insufficient documentation

## 2023-12-19 DIAGNOSIS — Z170 Estrogen receptor positive status [ER+]: Secondary | ICD-10-CM

## 2023-12-19 DIAGNOSIS — L818 Other specified disorders of pigmentation: Secondary | ICD-10-CM | POA: Diagnosis present

## 2023-12-19 DIAGNOSIS — Z801 Family history of malignant neoplasm of trachea, bronchus and lung: Secondary | ICD-10-CM | POA: Diagnosis not present

## 2023-12-19 DIAGNOSIS — Z853 Personal history of malignant neoplasm of breast: Secondary | ICD-10-CM | POA: Insufficient documentation

## 2023-12-19 DIAGNOSIS — N6489 Other specified disorders of breast: Secondary | ICD-10-CM | POA: Diagnosis not present

## 2023-12-19 DIAGNOSIS — M7989 Other specified soft tissue disorders: Secondary | ICD-10-CM

## 2023-12-19 NOTE — Progress Notes (Signed)
 [facility] Cancer Follow up:    Brooke Debby CROME, MD [address] [place] [address]   DIAGNOSIS: Cancer Staging  Malignant neoplasm of upper-outer quadrant of right breast in female, estrogen receptor positive (HCC) Staging form: Breast, AJCC 7th Edition - Clinical: Stage IIA (T2, N0, cM0) - Unsigned Specimen type: Core Needle Biopsy Laterality: Right Staging comments: Staged at breast conference 12.11.13  - Pathologic: No stage assigned - Unsigned Specimen type: Core Needle Biopsy Laterality: Right    SUMMARY OF ONCOLOGIC HISTORY: (1) status post right breast biopsy on 03/11/2012 for an invasive ductal carcinoma with tubular features, grade 2, ER positive, PR positive, HER-2/neu negative, Ki-67 20%   (2) status post definitive right lumpectomy and sentinel lymph node biopsy on 04/09/2012. 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 One sentinel node had isolated tumor cells. All margins clean.              (a) in the 2018 calcification her tumor would be stage IB   (3) Oncotype DX performed. Her score was 10, giving her a 7% risk of outside the breast recurrence if her only systemic therapy is tamoxifen  for 5 years   (4) She completed radiation therapy in April 2015   (5) She began tamoxifen  in May 2014 completing 5 years April 2019   (6) status post hysterectomy with left salpingo-oophorectomy January 22, 2017, with benign pathology  CURRENT THERAPY:  INTERVAL HISTORY:  Discussed the use of AI scribe software for clinical note transcription with the patient, who gave verbal consent to proceed.  History of Present Illness Brooke Wood is a 58 year old female with a history of right breast invasive ductal carcinoma who presents with hyperpigmentation and puffiness in the left axilla.  She was diagnosed with right breast invasive ductal carcinoma, stage 2A,  ERPR positive, HER2 negative in 2013. She underwent a lumpectomy, adjuvant radiation, and completed tamoxifen  therapy in 2019.  In late July 2025, she experienced itching and dryness at her nipples, which resolved with urea  cream. On August 27th, she was treated in urgent care for hyperpigmentation in her left axilla. The prescribed cream did not alleviate her symptoms. The hyperpigmentation persists, and she describes the area as feeling 'puffy'. She continues to use the cream as previously advised.     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  MEDICAL HISTORY: 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   Hx of adenomatous colonic polyps 03/03/2014   repeat 2022   Hx of radiation therapy 06/15/12 -  07/29/12   right rbeast, 50.4 Gy x 28 fx, boost to cumulative dose 60.4 gray   Hypertension    Personal history of radiation therapy 2014   PONV (postoperative nausea and vomiting)    SVD (spontaneous vaginal delivery)    x 1   Vocal cord dysfunction    Neg Methacholine challenge test 2007    SURGICAL HISTORY: Past Surgical History:  Procedure Laterality Date   BREAST LUMPECTOMY Right 2014   BREAST LUMPECTOMY WITH SENTINEL LYMPH NODE BIOPSY  04/09/2012   Procedure: BREAST LUMPECTOMY WITH SENTINEL LYMPH NODE BX;  Surgeon: Deward GORMAN Curvin DOUGLAS, MD;  Location: [facility];  Service: General;  Laterality: Right;   BREAST SURGERY  1985   rt br bx   COLONOSCOPY  03/2014   poylps   COLONOSCOPY WITH PROPOFOL  N/A 12/06/2021   Procedure: COLONOSCOPY WITH PROPOFOL ;  Surgeon: Avram Lupita BRAVO, MD;  Location: [facility];  Service: Gastroenterology;  Laterality: N/A;   CYSTOSCOPY N/A 01/22/2017   Procedure: CYSTOSCOPY;  Surgeon: Sarrah Browning, MD;  Location: [facility];  Service: Gynecology;  Laterality: N/A;   POLYPECTOMY  12/06/2021   Procedure: POLYPECTOMY;  Surgeon: Avram Lupita BRAVO, MD;  Location: [facility];  Service: Gastroenterology;;   ROBOTIC ASSISTED TOTAL HYSTERECTOMY WITH BILATERAL SALPINGO OOPHERECTOMY Bilateral 01/22/2017   Procedure: ROBOTIC ASSISTED TOTAL HYSTERECTOMY WITH BILATERAL SALPINGECTOMY AND LEFT OOPHORECTOMY;  Surgeon: Sarrah Browning, MD;  Location: [facility];  Service: Gynecology;  Laterality: Bilateral;    SOCIAL HISTORY: Social History   Socioeconomic History   Marital status: Single    Spouse name: Not on file   Number of children: Not on file   Years of education: Not on file   Highest education level: Not on file  Occupational History    Employer: UNEMPLOYED    Comment: [employer]  Tobacco Use   Smoking status: Never   Smokeless tobacco: Never  Vaping Use   Vaping status: Never Used  Substance and Sexual Activity   Alcohol use: No    Drug use: No   Sexual activity: Not Currently    Birth control/protection: None  Other Topics Concern   Not on file  Social History Narrative   Regular exercise-No   Social Drivers of Health   Financial Resource Strain: Not on file  Food Insecurity: Not on file  Transportation Needs: Not on file  Physical Activity: Not on file  Stress: Not on file  Social Connections: Not on file  Intimate Partner Violence: Not on file    FAMILY HISTORY: Family History  Problem Relation Age of Onset   Lung cancer Father 12       smoker   Cancer Father        lung   Hypertension Other    Alcohol abuse Neg Hx    Diabetes Neg Hx    Early death Neg Hx    Hearing loss Neg Hx    Heart disease Neg Hx    Hyperlipidemia Neg Hx    Stroke Neg Hx    Kidney disease Neg Hx    Colon cancer Neg Hx    Breast cancer Neg Hx  Colon polyps Neg Hx    Esophageal cancer Neg Hx    Rectal cancer Neg Hx    Stomach cancer Neg Hx     Review of Systems  Constitutional:  Negative for appetite change, chills, fatigue, fever and unexpected weight change.  HENT:   Negative for hearing loss, lump/mass and trouble swallowing.   Eyes:  Negative for eye problems and icterus.  Respiratory:  Negative for chest tightness, cough and shortness of breath.   Cardiovascular:  Negative for chest pain, leg swelling and palpitations.  Gastrointestinal:  Negative for abdominal distention, abdominal pain, constipation, diarrhea, nausea and vomiting.  Endocrine: Negative for hot flashes.  Genitourinary:  Negative for difficulty urinating.   Musculoskeletal:  Negative for arthralgias.  Skin:  Negative for itching and rash.  Neurological:  Negative for dizziness, extremity weakness, headaches and numbness.  Hematological:  Negative for adenopathy. Does not bruise/bleed easily.  Psychiatric/Behavioral:  Negative for depression. The patient is not nervous/anxious.       PHYSICAL EXAMINATION   Onc Performance Status -  12/19/23 1300       KPS SCALE   KPS % SCORE Normal, no compliants, no evidence of disease          Vitals:   12/19/23 1312  BP: 114/86  Pulse: (!) 109  Resp: 18  Temp: 97.7 F (36.5 C)  SpO2: 100%    Physical Exam Constitutional:      General: She is not in acute distress.    Appearance: Normal appearance. She is not toxic-appearing.  HENT:     Head: Normocephalic and atraumatic.     Mouth/Throat:     Mouth: Mucous membranes are moist.     Pharynx: Oropharynx is clear. No oropharyngeal exudate or posterior oropharyngeal erythema.  Eyes:     General: No scleral icterus. Cardiovascular:     Rate and Rhythm: Normal rate and regular rhythm.     Pulses: Normal pulses.     Heart sounds: Normal heart sounds.  Pulmonary:     Effort: Pulmonary effort is normal.     Breath sounds: Normal breath sounds.  Chest:     Comments: Bilateral breast benign, fullness in left axilla, hyperpigmentation in axilla is lighter Abdominal:     General: Abdomen is flat. Bowel sounds are normal. There is no distension.     Palpations: Abdomen is soft.     Tenderness: There is no abdominal tenderness.  Musculoskeletal:        General: No swelling.     Cervical back: Neck supple.  Lymphadenopathy:     Cervical: No cervical adenopathy.     Upper Body:     Right upper body: No supraclavicular or axillary adenopathy.     Left upper body: No supraclavicular or axillary adenopathy.  Skin:    General: Skin is warm and dry.     Findings: No rash.  Neurological:     General: No focal deficit present.     Mental Status: She is alert.  Psychiatric:        Mood and Affect: Mood normal.        Behavior: Behavior normal.      ASSESSMENT and THERAPY PLAN:   Assessment and Plan Assessment & Plan Left axillary hyperpigmentation and swelling. History of breast cancer. - Order mammogram and ultrasound of left breast and axilla. - Continue prescribed cream from urgent care, area is lighter than  pictured at her urgent care appt    All questions were answered. The  patient knows to call the clinic with any problems, questions or concerns. We can certainly see the patient much sooner if necessary.  Total encounter time:15 minutes*in face-to-face visit time, chart review, lab review, care coordination, order entry, and documentation of the encounter time.    Morna Kendall, NP 12/19/23 1:38 PM Medical Oncology and Hematology [facility] [address] [place], [place] [address] Tel. [phone]    Fax. [phone]  *Total Encounter Time as defined by the Centers for Medicare and Medicaid Services includes, in addition to the face-to-face time of a patient visit (documented in the note above) non-face-to-face time: obtaining and reviewing outside history, ordering and reviewing medications, tests or procedures, care coordination (communications with other health care professionals or caregivers) and documentation in the medical record.

## 2024-01-05 ENCOUNTER — Ambulatory Visit
Admission: RE | Admit: 2024-01-05 | Discharge: 2024-01-05 | Disposition: A | Source: Ambulatory Visit | Attending: Adult Health | Admitting: Adult Health

## 2024-01-05 ENCOUNTER — Ambulatory Visit
Admission: RE | Admit: 2024-01-05 | Discharge: 2024-01-05 | Disposition: A | Discharge status: Home / Self Care | Source: Ambulatory Visit | Attending: Adult Health | Admitting: Adult Health

## 2024-01-05 DIAGNOSIS — N6489 Other specified disorders of breast: Secondary | ICD-10-CM

## 2024-01-05 DIAGNOSIS — M7989 Other specified soft tissue disorders: Secondary | ICD-10-CM

## 2024-01-05 DIAGNOSIS — C50411 Malignant neoplasm of upper-outer quadrant of right female breast: Secondary | ICD-10-CM

## 2024-01-17 ENCOUNTER — Other Ambulatory Visit: Payer: Self-pay | Admitting: Internal Medicine

## 2024-01-17 DIAGNOSIS — E118 Type 2 diabetes mellitus with unspecified complications: Secondary | ICD-10-CM

## 2024-01-17 DIAGNOSIS — E785 Hyperlipidemia, unspecified: Secondary | ICD-10-CM

## 2024-01-17 DIAGNOSIS — I10 Essential (primary) hypertension: Secondary | ICD-10-CM

## 2024-03-19 ENCOUNTER — Other Ambulatory Visit: Payer: Self-pay | Admitting: Internal Medicine

## 2024-03-19 DIAGNOSIS — E118 Type 2 diabetes mellitus with unspecified complications: Secondary | ICD-10-CM

## 2024-03-19 DIAGNOSIS — E876 Hypokalemia: Secondary | ICD-10-CM

## 2024-03-19 DIAGNOSIS — I10 Essential (primary) hypertension: Secondary | ICD-10-CM

## 2024-03-22 ENCOUNTER — Inpatient Hospital Stay: Payer: BC Managed Care – PPO | Attending: Adult Health | Admitting: Adult Health

## 2024-04-05 ENCOUNTER — Other Ambulatory Visit: Payer: Self-pay | Admitting: Obstetrics and Gynecology

## 2024-04-05 DIAGNOSIS — Z1231 Encounter for screening mammogram for malignant neoplasm of breast: Secondary | ICD-10-CM

## 2024-04-12 ENCOUNTER — Other Ambulatory Visit: Payer: Self-pay | Admitting: Internal Medicine

## 2024-04-12 ENCOUNTER — Inpatient Hospital Stay: Admitting: Adult Health

## 2024-04-12 DIAGNOSIS — E785 Hyperlipidemia, unspecified: Secondary | ICD-10-CM

## 2024-04-16 ENCOUNTER — Inpatient Hospital Stay: Attending: Adult Health | Admitting: Adult Health

## 2024-04-16 ENCOUNTER — Encounter: Payer: Self-pay | Admitting: Adult Health

## 2024-04-16 VITALS — BP 128/71 | HR 73 | Temp 97.8°F | Ht 66.0 in | Wt 158.7 lb

## 2024-04-16 DIAGNOSIS — C50411 Malignant neoplasm of upper-outer quadrant of right female breast: Secondary | ICD-10-CM

## 2024-04-16 DIAGNOSIS — Z853 Personal history of malignant neoplasm of breast: Secondary | ICD-10-CM | POA: Diagnosis present

## 2024-04-16 DIAGNOSIS — Z801 Family history of malignant neoplasm of trachea, bronchus and lung: Secondary | ICD-10-CM | POA: Diagnosis not present

## 2024-04-16 DIAGNOSIS — E119 Type 2 diabetes mellitus without complications: Secondary | ICD-10-CM | POA: Diagnosis not present

## 2024-04-16 DIAGNOSIS — Z170 Estrogen receptor positive status [ER+]: Secondary | ICD-10-CM

## 2024-04-16 DIAGNOSIS — Z923 Personal history of irradiation: Secondary | ICD-10-CM | POA: Diagnosis not present

## 2024-04-16 NOTE — Progress Notes (Signed)
 [facility] Cancer Follow up:    Brooke Debby CROME, MD [address] [place] [address]   DIAGNOSIS: Cancer Staging  Malignant neoplasm of upper-outer quadrant of right breast in female, estrogen receptor positive (HCC) Staging form: Breast, AJCC 7th Edition - Clinical: Stage IIA (T2, N0, cM0) - Unsigned Specimen type: Core Needle Biopsy Laterality: Right Staging comments: Staged at breast conference 12.11.13  - Pathologic: No stage assigned - Unsigned Specimen type: Core Needle Biopsy Laterality: Right    SUMMARY OF ONCOLOGIC HISTORY: (1) status post right breast biopsy on 03/11/2012 for an invasive ductal carcinoma with tubular features, grade 2, ER positive, PR positive, HER-2/neu negative, Ki-67 20%   (2) status post definitive right lumpectomy and sentinel lymph node biopsy on 04/09/2012. 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 One sentinel node had isolated tumor cells. All margins clean.              (a) in the 2018 calcification her tumor would be stage IB   (3) Oncotype DX performed. Her score was 10, giving her a 7% risk of outside the breast recurrence if her only systemic therapy is tamoxifen  for 5 years   (4) She completed radiation therapy in April 2015   (5) She began tamoxifen  in May 2014 completing 5 years April 2019   (6) status post hysterectomy with left salpingo-oophorectomy January 22, 2017, with benign pathology  CURRENT THERAPY: observation  INTERVAL HISTORY:  Discussed the use of AI scribe software for clinical note transcription with the patient, who gave verbal consent to proceed.  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  She completed lumpectomy, adjuvant radiation, and tamoxifen  in 2019 and has  remained on observation. Her bilateral screening mammogram in February 2025 and targeted left breast diagnostic mammogram showed no evidence of malignancy, with breast density category C. Prior left axillary hyperpigmentation resolved with topical therapy. She has no new breast symptoms, including pain, palpable masses, or skin changes.  She follows with primary care for comorbidities and is working to increase physical activity, though has not yet started a structured program due to logistical barriers. She has transportation challenges that affect access to groceries and is seeking assistance with food access.  She has type 2 diabetes and is concerned about glycemic control, noting difficulty limiting sugary snacks at work. She has knee pain she attributes to prolonged standing at work. She denies lower extremity numbness or paresthesias.     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   Trochanteric bursitis of left hip 08/11/2018   Type II diabetes mellitus with manifestations (HCC) 07/02/2018   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   Abnormal electrocardiogram (ECG) (EKG) 10/08/2017   Hx of adenomatous colonic polyps 03/03/2014   Routine general medical examination at a health care facility 06/03/2013   Hyperlipidemia with target LDL less than 130 11/24/2012   Malignant neoplasm of  upper-outer quadrant of right breast in female, estrogen receptor positive (HCC) 03/06/2012   Goiter 07/26/2009   Essential hypertension, benign 01/19/2007   Allergic rhinitis 01/19/2007    is allergic to  aspirin  and codeine.  MEDICAL HISTORY: 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   Hx of adenomatous colonic polyps 03/03/2014   repeat 2022   Hx of radiation therapy 06/15/12 - 07/29/12   right rbeast, 50.4 Gy x 28 fx, boost to cumulative dose 60.4 gray   Hypertension    Personal history of radiation therapy 2014   PONV (postoperative nausea and vomiting)    SVD (spontaneous vaginal delivery)    x 1   Vocal cord dysfunction    Neg Methacholine challenge test 2007    SURGICAL HISTORY: Past Surgical History:  Procedure Laterality Date   BREAST LUMPECTOMY Right 2014   BREAST LUMPECTOMY WITH SENTINEL LYMPH NODE BIOPSY  04/09/2012   Procedure: BREAST LUMPECTOMY WITH SENTINEL LYMPH NODE BX;  Surgeon: Deward GORMAN Curvin DOUGLAS, MD;  Location: [facility];  Service: General;  Laterality: Right;   BREAST SURGERY  1985   rt br bx   COLONOSCOPY  03/2014   poylps   COLONOSCOPY WITH PROPOFOL  N/A 12/06/2021   Procedure: COLONOSCOPY WITH PROPOFOL ;  Surgeon: Avram Lupita BRAVO, MD;  Location: [facility];  Service: Gastroenterology;  Laterality: N/A;   CYSTOSCOPY N/A 01/22/2017   Procedure: CYSTOSCOPY;  Surgeon: Sarrah Browning, MD;  Location: [facility];  Service: Gynecology;  Laterality: N/A;   POLYPECTOMY  12/06/2021   Procedure: POLYPECTOMY;  Surgeon: Avram Lupita BRAVO, MD;  Location: [facility];  Service: Gastroenterology;;   ROBOTIC ASSISTED TOTAL HYSTERECTOMY WITH BILATERAL SALPINGO OOPHERECTOMY Bilateral 01/22/2017   Procedure: ROBOTIC ASSISTED TOTAL HYSTERECTOMY WITH BILATERAL SALPINGECTOMY AND LEFT OOPHORECTOMY;  Surgeon: Sarrah Browning, MD;  Location: [facility];  Service: Gynecology;  Laterality: Bilateral;    SOCIAL HISTORY: Social History   Socioeconomic History   Marital status: Single    Spouse name: Not on file    Number of children: Not on file   Years of education: Not on file   Highest education level: Not on file  Occupational History    Employer: UNEMPLOYED    Comment: [employer]  Tobacco Use   Smoking status: Never   Smokeless tobacco: Never  Vaping Use   Vaping status: Never Used  Substance and Sexual Activity   Alcohol use: No   Drug use: No   Sexual activity: Not Currently    Birth control/protection: None  Other Topics Concern   Not on file  Social History Narrative   Regular exercise-No   Social Drivers of Health   Tobacco Use: Low Risk (04/16/2024)   Patient History    Smoking Tobacco Use: Never    Smokeless Tobacco Use: Never    Passive Exposure: Not on file  Financial Resource Strain: Low Risk (04/16/2024)   Overall Financial Resource Strain (CARDIA)    Difficulty of Paying Living Expenses: Not hard at all  Food Insecurity: No Food Insecurity (04/16/2024)   Epic    Worried About Radiation Protection Practitioner of Food in the Last Year: Never true    Ran Out of Food in the Last Year: Never true  Transportation Needs: No Transportation Needs (04/16/2024)   Epic    Lack of Transportation (  Medical): No    Lack of Transportation (Non-Medical): No  Physical Activity: Inactive (04/16/2024)   Exercise Vital Sign    Days of Exercise per Week: 0 days    Minutes of Exercise per Session: 0 min  Stress: No Stress Concern Present (04/16/2024)   [employer] of Occupational Health - Occupational Stress Questionnaire    Feeling of Stress: Only a little  Social Connections: Socially Isolated (04/16/2024)   Social Connection and Isolation Panel    Frequency of Communication with Friends and Family: More than three times a week    Frequency of Social Gatherings with Friends and Family: More than three times a week    Attends Religious Services: Never    Database Administrator or Organizations: No    Attends Banker Meetings: Never    Marital Status: Divorced  Catering Manager  Violence: Not At Risk (04/16/2024)   Epic    Fear of Current or Ex-Partner: No    Emotionally Abused: No    Physically Abused: No    Sexually Abused: No  Depression (PHQ2-9): Low Risk (04/16/2024)   Depression (PHQ2-9)    PHQ-2 Score: 0  Alcohol Screen: Low Risk (04/16/2024)   Alcohol Screen    Last Alcohol Screening Score (AUDIT): 0  Housing: Unknown (04/16/2024)   Epic    Unable to Pay for Housing in the Last Year: No    Number of Times Moved in the Last Year: Not on file    Homeless in the Last Year: No  Utilities: Not At Risk (04/16/2024)   Epic    Threatened with loss of utilities: No  Health Literacy: Adequate Health Literacy (04/16/2024)   B1300 Health Literacy    Frequency of need for help with medical instructions: Never    FAMILY HISTORY: Family History  Problem Relation Age of Onset   Lung cancer Father 42       smoker   Cancer Father        lung   Hypertension Other    Alcohol abuse Neg Hx    Diabetes Neg Hx    Early death Neg Hx    Hearing loss Neg Hx    Heart disease Neg Hx    Hyperlipidemia Neg Hx    Stroke Neg Hx    Kidney disease Neg Hx    Colon cancer Neg Hx    Breast cancer Neg Hx    Colon polyps Neg Hx    Esophageal cancer Neg Hx    Rectal cancer Neg Hx    Stomach cancer Neg Hx     Review of Systems  Constitutional:  Negative for appetite change, chills, fatigue, fever and unexpected weight change.  HENT:   Negative for hearing loss, lump/mass and trouble swallowing.   Eyes:  Negative for eye problems and icterus.  Respiratory:  Negative for chest tightness, cough and shortness of breath.   Cardiovascular:  Negative for chest pain, leg swelling and palpitations.  Gastrointestinal:  Negative for abdominal distention, abdominal pain, constipation, diarrhea, nausea and vomiting.  Endocrine: Negative for hot flashes.  Genitourinary:  Negative for difficulty urinating.   Musculoskeletal:  Negative for arthralgias.  Skin:  Negative for itching and  rash.  Neurological:  Negative for dizziness, extremity weakness, headaches and numbness.  Hematological:  Negative for adenopathy. Does not bruise/bleed easily.  Psychiatric/Behavioral:  Negative for depression. The patient is not nervous/anxious.       PHYSICAL EXAMINATION   Onc Performance Status - 04/16/24 1005  ECOG Perf Status   ECOG Perf Status Restricted in physically strenuous activity but ambulatory and able to carry out work of a light or sedentary nature, e.g., light house work, office work      KPS SCALE   KPS % SCORE Able to carry on normal activity, minor s/s of disease          Vitals:   04/16/24 1004  BP: 128/71  Pulse: 73  Temp: 97.8 F (36.6 C)  SpO2: 100%    Physical Exam Constitutional:      General: She is not in acute distress.    Appearance: Normal appearance. She is not toxic-appearing.  HENT:     Head: Normocephalic and atraumatic.     Mouth/Throat:     Mouth: Mucous membranes are moist.     Pharynx: Oropharynx is clear. No oropharyngeal exudate or posterior oropharyngeal erythema.  Eyes:     General: No scleral icterus. Cardiovascular:     Rate and Rhythm: Normal rate and regular rhythm.     Pulses: Normal pulses.     Heart sounds: Normal heart sounds.  Pulmonary:     Effort: Pulmonary effort is normal.     Breath sounds: Normal breath sounds.  Chest:     Comments: Right breast s/p lumpectomy and radiation, no sign of local recurrence; left breast benign Abdominal:     General: Abdomen is flat. Bowel sounds are normal. There is no distension.     Palpations: Abdomen is soft.     Tenderness: There is no abdominal tenderness.  Musculoskeletal:        General: No swelling.     Cervical back: Neck supple.  Lymphadenopathy:     Cervical: No cervical adenopathy.     Upper Body:     Right upper body: No supraclavicular or axillary adenopathy.     Left upper body: No supraclavicular or axillary adenopathy.  Skin:    General: Skin  is warm and dry.     Findings: No rash.  Neurological:     General: No focal deficit present.     Mental Status: She is alert.  Psychiatric:        Mood and Affect: Mood normal.        Behavior: Behavior normal.     LABORATORY DATA:  CBC    Component Value Date/Time   WBC 6.1 11/10/2023 1002   RBC 4.85 11/10/2023 1002   HGB 14.0 11/10/2023 1002   HGB 13.5 05/12/2023 0911   HGB 14.0 05/16/2016 1005   HCT 42.7 11/10/2023 1002   HCT 42.2 05/16/2016 1005   PLT 226.0 11/10/2023 1002   PLT 187 05/12/2023 0911   PLT 191 05/16/2016 1005   MCV 87.9 11/10/2023 1002   MCV 87.6 05/16/2016 1005   MCH 29.3 05/12/2023 0911   MCHC 32.8 11/10/2023 1002   RDW 13.7 11/10/2023 1002   RDW 13.4 05/16/2016 1005   LYMPHSABS 2.6 11/10/2023 1002   LYMPHSABS 2.1 05/16/2016 1005   MONOABS 0.5 11/10/2023 1002   MONOABS 0.6 05/16/2016 1005   EOSABS 0.0 11/10/2023 1002   EOSABS 0.1 05/16/2016 1005   BASOSABS 0.0 11/10/2023 1002   BASOSABS 0.0 05/16/2016 1005    CMP     Component Value Date/Time   NA 137 11/10/2023 1002   NA 138 05/16/2016 1006   K 3.9 11/10/2023 1002   K 3.4 (L) 05/16/2016 1006   CL 99 11/10/2023 1002   CL 98 07/24/2012 1248   CO2 30 11/10/2023  1002   CO2 27 05/16/2016 1006   GLUCOSE 83 11/10/2023 1002   GLUCOSE 115 05/16/2016 1006   GLUCOSE 158 (H) 07/24/2012 1248   BUN 14 11/10/2023 1002   BUN 17.3 05/16/2016 1006   CREATININE 0.64 11/10/2023 1002   CREATININE 0.78 05/12/2023 0911   CREATININE 0.7 05/16/2016 1006   CALCIUM  10.0 11/10/2023 1002   CALCIUM  9.7 05/16/2016 1006   PROT 7.2 05/12/2023 0911   PROT 7.2 05/16/2016 1006   ALBUMIN 4.5 05/12/2023 0911   ALBUMIN 3.8 05/16/2016 1006   AST 11 (L) 05/12/2023 0911   AST 12 05/16/2016 1006   ALT 13 05/12/2023 0911   ALT 15 05/16/2016 1006   ALKPHOS 140 (H) 05/12/2023 0911   ALKPHOS 98 05/16/2016 1006   BILITOT 0.5 05/12/2023 0911   BILITOT 0.25 05/16/2016 1006   GFRNONAA >60 05/12/2023 0911   GFRAA >60  06/12/2017 0946     ASSESSMENT and THERAPY PLAN:   Assessment and Plan Assessment & Plan Stage IIa invasive ductal carcinoma of the right breast, ER positive Status post lumpectomy, adjuvant radiation, and completion of tamoxifen  in 2019. No evidence of recurrence. Recent imaging negative. No new symptoms reported. - Maintained surveillance for recurrence. - Provided anticipatory guidance on nutrition, emphasizing healthy diet and regular exercise. - Supplied healthy eating plan. - Encouraged use of social work resources for transportation and food access. - Recommended follow-up with primary care for health maintenance and comorbidity management. - Scheduled return visit in one year, or sooner if new symptoms develop.    All questions were answered. The patient knows to call the clinic with any problems, questions or concerns. We can certainly see the patient much sooner if necessary.  Total encounter time:30 minutes*in face-to-face visit time, chart review, lab review, care coordination, order entry, and documentation of the encounter time.    Morna Kendall, NP 04/16/24 10:29 AM Medical Oncology and Hematology [facility] [address] [place], [place] [address] Tel. [phone]    Fax. [phone]  *Total Encounter Time as defined by the Centers for Medicare and Medicaid Services includes, in addition to the face-to-face time of a patient visit (documented in the note above) non-face-to-face time: obtaining and reviewing outside history, ordering and reviewing medications, tests or procedures, care coordination (communications with other health care professionals or caregivers) and documentation in the medical record.

## 2024-05-05 ENCOUNTER — Other Ambulatory Visit: Payer: Self-pay | Admitting: Internal Medicine

## 2024-05-05 DIAGNOSIS — E041 Nontoxic single thyroid nodule: Secondary | ICD-10-CM

## 2024-05-10 ENCOUNTER — Ambulatory Visit

## 2024-05-17 ENCOUNTER — Ambulatory Visit: Admitting: Internal Medicine

## 2024-05-31 ENCOUNTER — Other Ambulatory Visit

## 2025-04-18 ENCOUNTER — Inpatient Hospital Stay: Admitting: Adult Health

## 2025-04-18 ENCOUNTER — Inpatient Hospital Stay
